# Patient Record
Sex: Female | Born: 1945 | Race: White | Hispanic: No | Smoking: Never smoker
Health system: Southern US, Community
[De-identification: ages and names within clinical notes are randomized; demographics above are authoritative.]

## PROBLEM LIST (undated history)

## (undated) DIAGNOSIS — E119 Type 2 diabetes mellitus without complications: Secondary | ICD-10-CM

## (undated) DIAGNOSIS — F419 Anxiety disorder, unspecified: Secondary | ICD-10-CM

## (undated) DIAGNOSIS — R569 Unspecified convulsions: Secondary | ICD-10-CM

## (undated) DIAGNOSIS — M329 Systemic lupus erythematosus, unspecified: Secondary | ICD-10-CM

## (undated) DIAGNOSIS — F32A Depression, unspecified: Secondary | ICD-10-CM

## (undated) DIAGNOSIS — M069 Rheumatoid arthritis, unspecified: Secondary | ICD-10-CM

## (undated) DIAGNOSIS — E875 Hyperkalemia: Secondary | ICD-10-CM

## (undated) DIAGNOSIS — F329 Major depressive disorder, single episode, unspecified: Secondary | ICD-10-CM

## (undated) DIAGNOSIS — I509 Heart failure, unspecified: Secondary | ICD-10-CM

## (undated) DIAGNOSIS — IMO0001 Reserved for inherently not codable concepts without codable children: Secondary | ICD-10-CM

## (undated) DIAGNOSIS — N189 Chronic kidney disease, unspecified: Secondary | ICD-10-CM

## (undated) DIAGNOSIS — IMO0002 Reserved for concepts with insufficient information to code with codable children: Secondary | ICD-10-CM

## (undated) DIAGNOSIS — I10 Essential (primary) hypertension: Secondary | ICD-10-CM

## (undated) DIAGNOSIS — G629 Polyneuropathy, unspecified: Secondary | ICD-10-CM

## (undated) DIAGNOSIS — R001 Bradycardia, unspecified: Secondary | ICD-10-CM

## (undated) HISTORY — PX: ABDOMINAL HYSTERECTOMY: SHX81

## (undated) HISTORY — PX: CYST EXCISION: SHX5701

## (undated) HISTORY — PX: APPENDECTOMY: SHX54

---

## 2015-01-18 HISTORY — PX: DENTAL SURGERY: SHX609

## 2015-01-22 ENCOUNTER — Inpatient Hospital Stay (HOSPITAL_COMMUNITY)
Admission: AD | Admit: 2015-01-22 | Discharge: 2015-01-28 | DRG: 100 | Disposition: A | Discharge status: Home Health | Payer: Medicare Other | Source: Other Acute Inpatient Hospital | Attending: Internal Medicine | Admitting: Internal Medicine

## 2015-01-22 DIAGNOSIS — I10 Essential (primary) hypertension: Secondary | ICD-10-CM | POA: Diagnosis not present

## 2015-01-22 DIAGNOSIS — E1165 Type 2 diabetes mellitus with hyperglycemia: Secondary | ICD-10-CM | POA: Diagnosis present

## 2015-01-22 DIAGNOSIS — M329 Systemic lupus erythematosus, unspecified: Secondary | ICD-10-CM | POA: Diagnosis present

## 2015-01-22 DIAGNOSIS — T465X6A Underdosing of other antihypertensive drugs, initial encounter: Secondary | ICD-10-CM | POA: Diagnosis present

## 2015-01-22 DIAGNOSIS — N183 Chronic kidney disease, stage 3 (moderate): Secondary | ICD-10-CM | POA: Diagnosis present

## 2015-01-22 DIAGNOSIS — M069 Rheumatoid arthritis, unspecified: Secondary | ICD-10-CM | POA: Diagnosis present

## 2015-01-22 DIAGNOSIS — G9340 Encephalopathy, unspecified: Secondary | ICD-10-CM | POA: Diagnosis present

## 2015-01-22 DIAGNOSIS — E119 Type 2 diabetes mellitus without complications: Secondary | ICD-10-CM | POA: Diagnosis present

## 2015-01-22 DIAGNOSIS — I129 Hypertensive chronic kidney disease with stage 1 through stage 4 chronic kidney disease, or unspecified chronic kidney disease: Secondary | ICD-10-CM | POA: Diagnosis present

## 2015-01-22 DIAGNOSIS — Z91040 Latex allergy status: Secondary | ICD-10-CM | POA: Diagnosis not present

## 2015-01-22 DIAGNOSIS — Z8249 Family history of ischemic heart disease and other diseases of the circulatory system: Secondary | ICD-10-CM

## 2015-01-22 DIAGNOSIS — R4182 Altered mental status, unspecified: Secondary | ICD-10-CM | POA: Diagnosis not present

## 2015-01-22 DIAGNOSIS — Z8673 Personal history of transient ischemic attack (TIA), and cerebral infarction without residual deficits: Secondary | ICD-10-CM | POA: Diagnosis not present

## 2015-01-22 DIAGNOSIS — R410 Disorientation, unspecified: Secondary | ICD-10-CM | POA: Diagnosis not present

## 2015-01-22 DIAGNOSIS — R4701 Aphasia: Secondary | ICD-10-CM | POA: Diagnosis present

## 2015-01-22 DIAGNOSIS — IMO0002 Reserved for concepts with insufficient information to code with codable children: Secondary | ICD-10-CM | POA: Diagnosis present

## 2015-01-22 DIAGNOSIS — R000 Tachycardia, unspecified: Secondary | ICD-10-CM | POA: Diagnosis present

## 2015-01-22 DIAGNOSIS — Z91128 Patient's intentional underdosing of medication regimen for other reason: Secondary | ICD-10-CM | POA: Diagnosis present

## 2015-01-22 DIAGNOSIS — E785 Hyperlipidemia, unspecified: Secondary | ICD-10-CM | POA: Diagnosis present

## 2015-01-22 DIAGNOSIS — N189 Chronic kidney disease, unspecified: Secondary | ICD-10-CM

## 2015-01-22 DIAGNOSIS — E1122 Type 2 diabetes mellitus with diabetic chronic kidney disease: Secondary | ICD-10-CM | POA: Diagnosis present

## 2015-01-22 DIAGNOSIS — Z801 Family history of malignant neoplasm of trachea, bronchus and lung: Secondary | ICD-10-CM

## 2015-01-22 DIAGNOSIS — E875 Hyperkalemia: Secondary | ICD-10-CM | POA: Diagnosis not present

## 2015-01-22 DIAGNOSIS — R441 Visual hallucinations: Secondary | ICD-10-CM | POA: Diagnosis present

## 2015-01-22 DIAGNOSIS — R400 Somnolence: Secondary | ICD-10-CM | POA: Diagnosis not present

## 2015-01-22 DIAGNOSIS — I248 Other forms of acute ischemic heart disease: Secondary | ICD-10-CM | POA: Diagnosis present

## 2015-01-22 DIAGNOSIS — Z820 Family history of epilepsy and other diseases of the nervous system: Secondary | ICD-10-CM | POA: Diagnosis not present

## 2015-01-22 DIAGNOSIS — R9082 White matter disease, unspecified: Secondary | ICD-10-CM | POA: Diagnosis present

## 2015-01-22 DIAGNOSIS — R079 Chest pain, unspecified: Secondary | ICD-10-CM

## 2015-01-22 DIAGNOSIS — E876 Hypokalemia: Secondary | ICD-10-CM | POA: Diagnosis present

## 2015-01-22 DIAGNOSIS — I209 Angina pectoris, unspecified: Secondary | ICD-10-CM | POA: Diagnosis present

## 2015-01-22 DIAGNOSIS — R569 Unspecified convulsions: Principal | ICD-10-CM | POA: Diagnosis present

## 2015-01-22 DIAGNOSIS — F419 Anxiety disorder, unspecified: Secondary | ICD-10-CM | POA: Diagnosis present

## 2015-01-22 DIAGNOSIS — N179 Acute kidney failure, unspecified: Secondary | ICD-10-CM | POA: Diagnosis present

## 2015-01-22 HISTORY — DX: Major depressive disorder, single episode, unspecified: F32.9

## 2015-01-22 HISTORY — DX: Rheumatoid arthritis, unspecified: M06.9

## 2015-01-22 HISTORY — DX: Hyperkalemia: E87.5

## 2015-01-22 HISTORY — DX: Anxiety disorder, unspecified: F41.9

## 2015-01-22 HISTORY — DX: Type 2 diabetes mellitus without complications: E11.9

## 2015-01-22 HISTORY — DX: Polyneuropathy, unspecified: G62.9

## 2015-01-22 HISTORY — DX: Essential (primary) hypertension: I10

## 2015-01-22 HISTORY — DX: Reserved for inherently not codable concepts without codable children: IMO0001

## 2015-01-22 HISTORY — DX: Bradycardia, unspecified: R00.1

## 2015-01-22 HISTORY — DX: Chronic kidney disease, unspecified: N18.9

## 2015-01-22 HISTORY — DX: Unspecified convulsions: R56.9

## 2015-01-22 HISTORY — DX: Depression, unspecified: F32.A

## 2015-01-22 HISTORY — DX: Systemic lupus erythematosus, unspecified: M32.9

## 2015-01-22 HISTORY — DX: Reserved for concepts with insufficient information to code with codable children: IMO0002

## 2015-01-22 HISTORY — DX: Heart failure, unspecified: I50.9

## 2015-01-22 LAB — CBC
HCT: 31.7 % — ABNORMAL LOW (ref 36.0–46.0)
Hemoglobin: 10.1 g/dL — ABNORMAL LOW (ref 12.0–15.0)
MCH: 25.9 pg — ABNORMAL LOW (ref 26.0–34.0)
MCHC: 31.9 g/dL (ref 30.0–36.0)
MCV: 81.3 fL (ref 78.0–100.0)
Platelets: 157 K/uL (ref 150–400)
RBC: 3.9 MIL/uL (ref 3.87–5.11)
RDW: 15 % (ref 11.5–15.5)
WBC: 13.7 K/uL — ABNORMAL HIGH (ref 4.0–10.5)

## 2015-01-22 LAB — GLUCOSE, CAPILLARY: Glucose-Capillary: 332 mg/dL — ABNORMAL HIGH (ref 65–99)

## 2015-01-22 LAB — HEPATIC FUNCTION PANEL
ALBUMIN: 3.5 g/dL (ref 3.5–5.0)
ALT: 17 U/L (ref 14–54)
AST: 43 U/L — AB (ref 15–41)
Alkaline Phosphatase: 121 U/L (ref 38–126)
Bilirubin, Direct: 0.3 mg/dL (ref 0.1–0.5)
Indirect Bilirubin: 1.1 mg/dL — ABNORMAL HIGH (ref 0.3–0.9)
TOTAL PROTEIN: 7 g/dL (ref 6.5–8.1)
Total Bilirubin: 1.4 mg/dL — ABNORMAL HIGH (ref 0.3–1.2)

## 2015-01-22 LAB — CREATININE, SERUM
Creatinine, Ser: 1.25 mg/dL — ABNORMAL HIGH (ref 0.44–1.00)
GFR calc Af Amer: 50 mL/min — ABNORMAL LOW (ref >60)
GFR calc non Af Amer: 43 mL/min — ABNORMAL LOW (ref >60)

## 2015-01-22 LAB — MRSA PCR SCREENING: MRSA BY PCR: NEGATIVE

## 2015-01-22 LAB — TSH: TSH: 2.247 u[IU]/mL (ref 0.350–4.500)

## 2015-01-22 LAB — PROTIME-INR
INR: 1.24 (ref 0.00–1.49)
Prothrombin Time: 15.7 seconds — ABNORMAL HIGH (ref 11.6–15.2)

## 2015-01-22 LAB — TROPONIN I: TROPONIN I: 0.35 ng/mL — AB (ref <0.031)

## 2015-01-22 MED ORDER — HYDRALAZINE HCL 20 MG/ML IJ SOLN
10.0000 mg | Freq: Once | INTRAMUSCULAR | Status: AC
  Administered 2015-01-22: 10 mg via INTRAVENOUS
  Filled 2015-01-22: qty 1

## 2015-01-22 MED ORDER — ONDANSETRON HCL 4 MG PO TABS: 4.0000 mg | ORAL_TABLET | Freq: Four times a day (QID) | ORAL | Status: DC | PRN

## 2015-01-22 MED ORDER — ACETAMINOPHEN 650 MG RE SUPP
650.0000 mg | Freq: Four times a day (QID) | RECTAL | Status: DC | PRN
  Administered 2015-01-23: 650 mg via RECTAL
  Filled 2015-01-22: qty 1

## 2015-01-22 MED ORDER — NITROGLYCERIN 2 % TD OINT
0.5000 [in_us] | TOPICAL_OINTMENT | Freq: Four times a day (QID) | TRANSDERMAL | Status: DC
  Administered 2015-01-22 – 2015-01-26 (×15): 0.5 [in_us] via TOPICAL
  Filled 2015-01-22 (×3): qty 30

## 2015-01-22 MED ORDER — LORAZEPAM 2 MG/ML IJ SOLN
1.0000 mg | INTRAMUSCULAR | Status: DC | PRN
  Administered 2015-01-23: 2 mg via INTRAVENOUS
  Administered 2015-01-23: 1.5 mg via INTRAVENOUS
  Filled 2015-01-22 (×4): qty 1

## 2015-01-22 MED ORDER — SODIUM CHLORIDE 0.9 % IJ SOLN
3.0000 mL | Freq: Two times a day (BID) | INTRAMUSCULAR | Status: DC
  Administered 2015-01-23 – 2015-01-28 (×7): 3 mL via INTRAVENOUS

## 2015-01-22 MED ORDER — VITAMIN B-1 100 MG PO TABS
100.0000 mg | ORAL_TABLET | Freq: Every day | ORAL | Status: DC
  Administered 2015-01-22 – 2015-01-28 (×5): 100 mg via ORAL
  Filled 2015-01-22 (×7): qty 1

## 2015-01-22 MED ORDER — INSULIN ASPART 100 UNIT/ML ~~LOC~~ SOLN
0.0000 [IU] | SUBCUTANEOUS | Status: DC
  Administered 2015-01-22: 11 [IU] via SUBCUTANEOUS
  Administered 2015-01-23: 3 [IU] via SUBCUTANEOUS
  Administered 2015-01-23: 11 [IU] via SUBCUTANEOUS
  Administered 2015-01-23: 2 [IU] via SUBCUTANEOUS
  Administered 2015-01-23 – 2015-01-24 (×2): 3 [IU] via SUBCUTANEOUS
  Administered 2015-01-24: 5 [IU] via SUBCUTANEOUS
  Administered 2015-01-24: 3 [IU] via SUBCUTANEOUS
  Administered 2015-01-24: 2 [IU] via SUBCUTANEOUS

## 2015-01-22 MED ORDER — DOCUSATE SODIUM 100 MG PO CAPS
100.0000 mg | ORAL_CAPSULE | Freq: Two times a day (BID) | ORAL | Status: DC
  Administered 2015-01-25 – 2015-01-28 (×7): 100 mg via ORAL
  Filled 2015-01-22 (×7): qty 1

## 2015-01-22 MED ORDER — POLYETHYLENE GLYCOL 3350 17 G PO PACK
17.0000 g | PACK | Freq: Every day | ORAL | Status: DC | PRN
  Filled 2015-01-22: qty 1

## 2015-01-22 MED ORDER — METFORMIN HCL 500 MG PO TABS
1000.0000 mg | ORAL_TABLET | Freq: Two times a day (BID) | ORAL | Status: DC
  Filled 2015-01-22 (×3): qty 2

## 2015-01-22 MED ORDER — INSULIN DETEMIR 100 UNIT/ML ~~LOC~~ SOLN
80.0000 [IU] | Freq: Every day | SUBCUTANEOUS | Status: DC
  Administered 2015-01-22: 80 [IU] via SUBCUTANEOUS
  Filled 2015-01-22 (×2): qty 0.8

## 2015-01-22 MED ORDER — ADULT MULTIVITAMIN W/MINERALS CH
1.0000 | ORAL_TABLET | Freq: Every day | ORAL | Status: DC
  Administered 2015-01-22 – 2015-01-28 (×5): 1 via ORAL
  Filled 2015-01-22 (×7): qty 1

## 2015-01-22 MED ORDER — ACETAMINOPHEN 325 MG PO TABS
650.0000 mg | ORAL_TABLET | Freq: Four times a day (QID) | ORAL | Status: DC | PRN
  Administered 2015-01-24: 650 mg via ORAL
  Filled 2015-01-22: qty 2

## 2015-01-22 MED ORDER — PHENYTOIN SODIUM 50 MG/ML IJ SOLN
100.0000 mg | Freq: Three times a day (TID) | INTRAMUSCULAR | Status: DC
  Administered 2015-01-23 – 2015-01-24 (×3): 100 mg via INTRAVENOUS
  Filled 2015-01-22 (×6): qty 2

## 2015-01-22 MED ORDER — SODIUM CHLORIDE 0.9 % IV SOLN
75.0000 mL/h | INTRAVENOUS | Status: DC
  Administered 2015-01-22 – 2015-01-25 (×2): 75 mL/h via INTRAVENOUS

## 2015-01-22 MED ORDER — FOLIC ACID 1 MG PO TABS
1.0000 mg | ORAL_TABLET | Freq: Every day | ORAL | Status: DC
  Administered 2015-01-22 – 2015-01-28 (×5): 1 mg via ORAL
  Filled 2015-01-22 (×7): qty 1

## 2015-01-22 MED ORDER — CLONIDINE HCL 0.3 MG PO TABS
0.3000 mg | ORAL_TABLET | Freq: Two times a day (BID) | ORAL | Status: DC
  Administered 2015-01-22: 0.3 mg via ORAL
  Filled 2015-01-22 (×3): qty 1

## 2015-01-22 MED ORDER — SERTRALINE HCL 100 MG PO TABS
100.0000 mg | ORAL_TABLET | Freq: Every day | ORAL | Status: AC
  Administered 2015-01-22 – 2015-01-27 (×4): 100 mg via ORAL
  Filled 2015-01-22 (×7): qty 1

## 2015-01-22 MED ORDER — LORAZEPAM 2 MG/ML IJ SOLN
4.0000 mg | INTRAMUSCULAR | Status: AC
  Administered 2015-01-22: 4 mg via INTRAVENOUS
  Filled 2015-01-22: qty 2

## 2015-01-22 MED ORDER — LORAZEPAM 2 MG/ML IJ SOLN
4.0000 mg | INTRAMUSCULAR | Status: AC
  Administered 2015-01-22: 4 mg via INTRAVENOUS

## 2015-01-22 MED ORDER — ONDANSETRON HCL 4 MG/2ML IJ SOLN: 4.0000 mg | Freq: Four times a day (QID) | INTRAMUSCULAR | Status: DC | PRN

## 2015-01-22 MED ORDER — SODIUM CHLORIDE 0.9 % IV SOLN: INTRAVENOUS | Status: DC

## 2015-01-22 MED ORDER — PRAVASTATIN SODIUM 40 MG PO TABS
40.0000 mg | ORAL_TABLET | Freq: Every day | ORAL | Status: DC
  Administered 2015-01-24: 40 mg via ORAL
  Filled 2015-01-22 (×3): qty 1

## 2015-01-22 MED ORDER — SODIUM CHLORIDE 0.9 % IV SOLN
1000.0000 mg | Freq: Once | INTRAVENOUS | Status: AC
  Administered 2015-01-22: 1000 mg via INTRAVENOUS
  Filled 2015-01-22: qty 20

## 2015-01-22 MED ORDER — PANTOPRAZOLE SODIUM 40 MG PO TBEC
40.0000 mg | DELAYED_RELEASE_TABLET | Freq: Every day | ORAL | Status: DC
  Administered 2015-01-25 – 2015-01-28 (×4): 40 mg via ORAL
  Filled 2015-01-22 (×5): qty 1

## 2015-01-22 MED ORDER — HYDRALAZINE HCL 50 MG PO TABS
50.0000 mg | ORAL_TABLET | Freq: Three times a day (TID) | ORAL | Status: DC
  Administered 2015-01-22: 50 mg via ORAL
  Filled 2015-01-22 (×5): qty 1

## 2015-01-22 MED ORDER — HEPARIN SODIUM (PORCINE) 5000 UNIT/ML IJ SOLN
5000.0000 [IU] | Freq: Three times a day (TID) | INTRAMUSCULAR | Status: DC
  Administered 2015-01-22 – 2015-01-28 (×17): 5000 [IU] via SUBCUTANEOUS
  Filled 2015-01-22 (×21): qty 1

## 2015-01-22 NOTE — Progress Notes (Signed)
MEDICATION RELATED CONSULT NOTE - INITIAL   Pharmacy Consult for phenytoin IV Indication: new-onset seizures  Allergies  Allergen Reactions  . Latex     Patient Measurements: Height: 5\' 2"  (157.5 cm) Weight: 154 lb 8.7 oz (70.1 kg) IBW/kg (Calculated) : 50.1 Adjusted BW: 58.1kg  Vital Signs: Temp: 99.5 F (37.5 C) (09/08 2018) Temp Source: Axillary (09/08 2018) BP: 214/92 mmHg (09/08 1849) Pulse Rate: 127 (09/08 1850) Intake/Output from previous day:   Intake/Output from this shift:    Labs: No results for input(s): WBC, HGB, HCT, PLT, APTT, CREATININE, LABCREA, CREATININE, CREAT24HRUR, MG, PHOS, ALBUMIN, PROT, ALBUMIN, AST, ALT, ALKPHOS, BILITOT, BILIDIR, IBILI in the last 72 hours. CrCl cannot be calculated (Patient has no serum creatinine result on file.).   Microbiology: Recent Results (from the past 720 hour(s))  MRSA PCR Screening     Status: None   Collection Time: 01/22/15  6:45 PM  Result Value Ref Range Status   MRSA by PCR NEGATIVE NEGATIVE Final    Comment:        The GeneXpert MRSA Assay (FDA approved for NASAL specimens only), is one component of a comprehensive MRSA colonization surveillance program. It is not intended to diagnose MRSA infection nor to guide or monitor treatment for MRSA infections.     Medical History: No past medical history on file.  Assessment: 69 y.o. female with prior history of DM Type II, CKD, HTN, Lupus, and RA presents as a transfer from [facility] regional for new onset seizures and altered mental status. Pharmacy consulted to dose phenytoin IV and to assess for drug interactions. Adjusted BW used due to TBW> 120% of IBW. Will order baseline labs not already entered. No labs available yet.   Goal of Therapy:  Seizure prevention  Plan:  Phenytoin 1g IV load Phenytoin 100mg  IV q8h - to start 12h after end of load Baseline SCr, LFTs, albumin F/u phenytoin level after 24 hours to ensure appropriate load Obtain a  level 5-7 days after therapy initiation or adjustments to assess trend in concentration.  A true steady state is not reached for 10-14 days   Elicia Lamp, PharmD Clinical Pharmacist Pager [phone] 01/22/2015 9:06 PM

## 2015-01-22 NOTE — Consult Note (Addendum)
Reason for Consult:Altered mental status, seizure Referring Physician: Humphrey Rolls  CC: Altered mental status, seizure  HPI: Stacy Smith is an 69 y.o. female with altered mental status.  Patient unable to provide history.  All history obtained from the chart.  Patient presented to [facility] from home. Patient's husband states that around 62 PM today she was found on the floor. Patients family called EMS. He states that he asked her to squeeze his finger and she was unable to do that so he was concerned for stroke. Patient's daughter states that she saw the patient having a seizure which lasted about 30 seconds after which she started to snore and grunt and went limp. She did not vomit but did she lose control of her bowel and bladder in the ambulance.  Patient has had recent visual hallucinations.  Has been noncompliant with her antihypertensive medications.    Past medical history: DM, CKD, HTN, Lupus, RA, CHF  Past surgical history: Unable to obtain secondary to mental status  Family history: Mother with CHF and Alzheimer's.  Father with metastatic lung cancer.    Social History:  has no tobacco, alcohol, and drug history on file.  Allergies  Allergen Reactions  . Latex     Medications:  I have reviewed the patient's current medications. Prior to Admission:  Prescriptions prior to admission  Medication Sig Dispense Refill Last Dose  . cloNIDine (CATAPRES) 0.3 MG tablet Take 0.3 mg by mouth 2 (two) times daily.     Marland Kitchen LEVEMIR FLEXTOUCH 100 UNIT/ML Pen      . LORazepam (ATIVAN) 1 MG tablet Take 1 mg by mouth 3 (three) times daily.     . metFORMIN (GLUCOPHAGE) 1000 MG tablet Take 1,000 mg by mouth 2 (two) times daily.     Marland Kitchen omeprazole (PRILOSEC) 40 MG capsule Take 40 mg by mouth daily.     . ondansetron (ZOFRAN-ODT) 8 MG disintegrating tablet Take 8 mg by mouth 2 (two) times daily.     . pravastatin (PRAVACHOL) 80 MG tablet Take 80 mg by mouth daily.     . sertraline (ZOLOFT) 100 MG  tablet Take 100 mg by mouth 2 (two) times daily.     Marland Kitchen tiZANidine (ZANAFLEX) 4 MG tablet Take 4 mg by mouth.      Scheduled: . cloNIDine  0.3 mg Oral BID  . docusate sodium  100 mg Oral BID  . folic acid  1 mg Oral Daily  . heparin  5,000 Units Subcutaneous 3 times per day  . hydrALAZINE  50 mg Oral 3 times per day  . insulin aspart  0-15 Units Subcutaneous 6 times per day  . insulin detemir  80 Units Subcutaneous QHS  . [START ON 01/23/2015] metFORMIN  1,000 mg Oral BID WC  . multivitamin with minerals  1 tablet Oral Daily  . nitroGLYCERIN  0.5 inch Topical 4 times per day  . pantoprazole  40 mg Oral Daily  . phenytoin (DILANTIN) IV  1,000 mg Intravenous Once  . [START ON 01/23/2015] phenytoin (DILANTIN) IV  100 mg Intravenous 3 times per day  . [START ON 01/23/2015] pravastatin  40 mg Oral q1800  . sertraline  100 mg Oral Daily  . sodium chloride  3 mL Intravenous Q12H  . thiamine  100 mg Oral Daily    ROS: History obtained from chart review  General ROS: negative for - chills, fatigue, fever, night sweats, weight gain or weight loss Psychological ROS: as noted in HPI Ophthalmic ROS: negative  for - blurry vision, double vision, eye pain or loss of vision ENT ROS: negative for - epistaxis, nasal discharge, oral lesions, sore throat, tinnitus or vertigo Allergy and Immunology ROS: negative for - hives or itchy/watery eyes Hematological and Lymphatic ROS: negative for - bleeding problems, bruising or swollen lymph nodes Endocrine ROS: negative for - galactorrhea, hair pattern changes, polydipsia/polyuria or temperature intolerance Respiratory ROS: negative for - cough, hemoptysis, shortness of breath or wheezing Cardiovascular ROS: negative for - chest pain, dyspnea on exertion, edema or irregular heartbeat Gastrointestinal ROS: as noted in HPI Genito-Urinary ROS: as noted in HPI Musculoskeletal ROS: negative for - joint swelling or muscular weakness Neurological ROS: as noted in  HPI Dermatological ROS: negative for rash and skin lesion changes  Physical Examination: Blood pressure 214/92, pulse 127, temperature 99.5 F (37.5 C), temperature source Axillary, resp. rate 26, height _0  (1.575 m), weight 70.1 kg (154 lb 8.7 oz), SpO2 99 %.  HEENT-  Normocephalic, no lesions, without obvious abnormality.  Normal external eye and conjunctiva.  Normal TM's bilaterally.  Normal auditory canals and external ears. Normal external nose, mucus membranes and septum.  Normal pharynx. Cardiovascular- S1, S2 normal, pulses palpable throughout   Lungs- chest clear, no wheezing, rales, normal symmetric air entry Abdomen- soft, non-tender; bowel sounds normal; no masses,  no organomegaly Extremities- no edema Lymph-no adenopathy palpable Musculoskeletal-no joint tenderness, deformity or swelling Skin-warm and dry, no hyperpigmentation, vitiligo, or suspicious lesions  Neurological Examination Mental Status: Alert and agitated. Patient oriented to name but reports that it is 20 and that it is October.  Thinks she is still at [place] commands.  Speech aphasic, incomprehensible at times, some paraphasic errors noted.  Tries to eat mits. Cranial Nerves: II: Discs flat bilaterally; Blinks to bilateral confrontation, pupils equal, round, reactive to light and accommodation III,IV, VI: ptosis not present, extra-ocular motions intact bilaterally V,VII: smile symmetric, facial light touch sensation normal bilaterally VIII: hearing normal bilaterally IX,X: gag reflex present XI: bilateral shoulder shrug XII: midline tongue extension Motor: Moves all extremities strongly against gravity.  No focal weakness noted.   Sensory: Pinprick and light touch intact throughout, bilaterally Deep Tendon Reflexes: 2+ and symmetric throughout Plantars: Right: mute   Left: mute Cerebellar: Unable to perform secondary to agitation Gait: not tested due to safety  concerns    Laboratory Studies:   Basic Metabolic Panel: No results for input(s): NA, K, CL, CO2, GLUCOSE, BUN, CREATININE, CALCIUM, MG, PHOS in the last 168 hours.  Liver Function Tests: No results for input(s): AST, ALT, ALKPHOS, BILITOT, PROT, ALBUMIN in the last 168 hours. No results for input(s): LIPASE, AMYLASE in the last 168 hours. No results for input(s): AMMONIA in the last 168 hours.  CBC: No results for input(s): WBC, NEUTROABS, HGB, HCT, MCV, PLT in the last 168 hours.  Cardiac Enzymes: No results for input(s): CKTOTAL, CKMB, CKMBINDEX, TROPONINI in the last 168 hours.  BNP: Invalid input(s): POCBNP  CBG: No results for input(s): GLUCAP in the last 168 hours.  Microbiology: Results for orders placed or performed during the hospital encounter of 01/22/15  MRSA PCR Screening     Status: None   Collection Time: 01/22/15  6:45 PM  Result Value Ref Range Status   MRSA by PCR NEGATIVE NEGATIVE Final    Comment:        The GeneXpert MRSA Assay (FDA approved for NASAL specimens only), is one component of a comprehensive MRSA colonization surveillance program. It is  not intended to diagnose MRSA infection nor to guide or monitor treatment for MRSA infections.     Coagulation Studies: No results for input(s): LABPROT, INR in the last 72 hours.  Urinalysis: No results for input(s): COLORURINE, LABSPEC, PHURINE, GLUCOSEU, HGBUR, BILIRUBINUR, KETONESUR, PROTEINUR, UROBILINOGEN, NITRITE, LEUKOCYTESUR in the last 168 hours.  Invalid input(s): APPERANCEUR  Lipid Panel:  No results found for: CHOL, TRIG, HDL, CHOLHDL, VLDL, LDLCALC  HgbA1C: No results found for: HGBA1C  Urine Drug Screen:  No results found for: LABOPIA, COCAINSCRNUR, LABBENZ, AMPHETMU, THCU, LABBARB  Alcohol Level: No results for input(s): ETH in the last 168 hours.   Imaging: No results found.   Assessment/Plan: 69 year old female with altered mental status.  Was noted by family to have a  seizure today.  Does not have a history of seizures.  On neurological examination not only is the patient confused, but appears to be aphasic as well.  Is hypertensive, likely due to noncompliance.  Can not rule out PRES as etiology of all presenting symptoms but other things of concern in the differential include metabolic encephalopathy, intracranial lesion, CNS lupus, prolonged post-ictal state, etc,  Lab work from [place] reviewed and multiple metabolic issues are present.  Head CT performed at [facility] shows no evidence of hemorrhage.  Further work up recommended.    Recommendations: 1.  Patient will likely require a MRI of the brain with contrast as well but will likely require conscious sedation due to level of agitation 2.  EEG 3.  Seizure precautions 4.  Ativan prn seizure 5.  ESR, serum magnesium, serum phosphorus 6.  Patient started on Dilantin.  Need to continue beyond this hospitalization to be determined based on above work up.    7.  BP control 8.  Agree with addressing metabolic issues   Alexis Goodell, MD [facility] [phone] 01/22/2015, 9:04 PM

## 2015-01-22 NOTE — H&P (Signed)
[facility] History and Physical  Stacy Smith [id number] DOB: Oct 28, 1945 DOA: 01/22/2015  Referring physician: Deeann Dowse, MD PCP: No primary care provider on file.   Chief Complaint: New onset seizures and altered mental status  HPI: Stacy Smith is a 69 y.o. female with prior history of DM Type II CKD HTN Lupus RA presents as a transfer from [facility] regional for new onset seizures and altered mental status. Patient presented to [facility] from home. Patients husband states that around 12 PM today she was found on the floor. Patients family called EMS. He states that he asked her to squeeze his finger and she was able to do that as he was concerned for stroke. Patients daughter states that she saw the patient having a seizure which lasted about 30 seconds after which she started to snore and grunt and went limp. She did not vomit and she lost control of her bowel and bladder in the ambulance. Her husband states that she has been sick 3 times with a slow heart beat and also CHF. Patient was admitted at that hospital at that time. She has had a problem with hyperkalemia in the past. She also has CKD III and is a diabetic. She has had a CT scan done in the past which acording to the daughter showed a blood clot which had "sealed off" The daughter states that she also believes that she sees her dead parents and she is also seeing other things in the rooms that are not there.   Review of Systems:  Patient is not able to provide a HPI.   No past medical history on file. No past surgical history on file. Social History:  has no tobacco, alcohol, and drug history on file.  Allergies not on file  No family history on file.   Prior to Admission medications   Not on File   Physical Exam: Filed Vitals:   01/22/15 1849 01/22/15 1850  BP: 214/92   Pulse: 122 127  Temp: 100.6 F (38.1 C) 99.5 F (37.5 C)  TempSrc: Oral Rectal  Resp: 17 26  SpO2: 99%     Wt  Readings from Last 3 Encounters:  No data found for Wt    General:  Combative at times not oriented Eyes: normal lids, irises & conjunctiva ENT: grossly normal hearing Neck: no LAD, masses or thyromegaly Cardiovascular: RRR, no m/r/g. Respiratory: CTA bilaterally, no w/r/r Abdomen: soft, ntnd Skin: no rash or induration seen on limited exam Musculoskeletal: grossly normal tone BUE/BLE Psychiatric: confused agitated Neurologic: unable to assess but she is moving all 4 extremities          Labs on Admission:  Basic Metabolic Panel: No results for input(s): NA, K, CL, CO2, GLUCOSE, BUN, CREATININE, CALCIUM, MG, PHOS in the last 168 hours. Liver Function Tests: No results for input(s): AST, ALT, ALKPHOS, BILITOT, PROT, ALBUMIN in the last 168 hours. No results for input(s): LIPASE, AMYLASE in the last 168 hours. No results for input(s): AMMONIA in the last 168 hours. CBC: No results for input(s): WBC, NEUTROABS, HGB, HCT, MCV, PLT in the last 168 hours. Cardiac Enzymes: No results for input(s): CKTOTAL, CKMB, CKMBINDEX, TROPONINI in the last 168 hours.  BNP (last 3 results) No results for input(s): BNP in the last 8760 hours.  ProBNP (last 3 results) No results for input(s): PROBNP in the last 8760 hours.  CBG: No results for input(s): GLUCAP in the last 168 hours.  Radiological Exams on Admission: No results  found.    Assessment/Plan Principal Problem:   Convulsions/seizures Active Problems:   Diabetes   Essential hypertension   Hyperlipidemia   1. New onset seizures/Altered Mental Status -possibly related to not taking her blood pressure meds and elevated BP noted  -currently she is seizure free but is quite confused. She will need a neurology evaluation and MRI MRA -will defer to neurology regarding any antiseizure medications -EEG ordered -frequent neuro checks  -will give ativan as needed for seizure  2. Diabetes Mellitus type 2 -she is on Metformin and  also is on levemir -will check an A1C -will monitor FSBS and place on SSI as needed  3. Essential HTN -continue with clonidine per family she is not taking lisinopril as she had some side effects from these -will restart hydralazine and consider also starting back on inderal to bring pressures down slowly -monitor pressures  4. Hyperlipidemia -will continue with pravachol not on lopid per family -check lipid panel  5. Rheumatoid Arthritis/Lupus -will monitor -recheck sed rate  6. CKD III -repeat labs  7. Hypokalemia -noted at other facility will repeat labs    Code Status: full code (must indicate code status--if unknown or must be presumed, indicate so) DVT Prophylaxis:heparin Family Communication: husband (indicate person spoken with, if applicable, with phone number if by telephone) Disposition Plan: home (indicate anticipated LOS)     [facility] Pager [phone]

## 2015-01-23 ENCOUNTER — Other Ambulatory Visit (HOSPITAL_COMMUNITY): Payer: Self-pay

## 2015-01-23 ENCOUNTER — Inpatient Hospital Stay (HOSPITAL_COMMUNITY): Payer: Medicare Other

## 2015-01-23 DIAGNOSIS — N179 Acute kidney failure, unspecified: Secondary | ICD-10-CM | POA: Diagnosis present

## 2015-01-23 DIAGNOSIS — E876 Hypokalemia: Secondary | ICD-10-CM | POA: Diagnosis present

## 2015-01-23 DIAGNOSIS — E785 Hyperlipidemia, unspecified: Secondary | ICD-10-CM | POA: Diagnosis present

## 2015-01-23 DIAGNOSIS — R569 Unspecified convulsions: Secondary | ICD-10-CM | POA: Diagnosis present

## 2015-01-23 DIAGNOSIS — M329 Systemic lupus erythematosus, unspecified: Secondary | ICD-10-CM | POA: Diagnosis present

## 2015-01-23 DIAGNOSIS — M069 Rheumatoid arthritis, unspecified: Secondary | ICD-10-CM | POA: Diagnosis present

## 2015-01-23 DIAGNOSIS — R4182 Altered mental status, unspecified: Secondary | ICD-10-CM | POA: Diagnosis present

## 2015-01-23 DIAGNOSIS — R400 Somnolence: Secondary | ICD-10-CM

## 2015-01-23 DIAGNOSIS — N189 Chronic kidney disease, unspecified: Secondary | ICD-10-CM

## 2015-01-23 LAB — CBC
HEMATOCRIT: 32.2 % — AB (ref 36.0–46.0)
HEMOGLOBIN: 10.4 g/dL — AB (ref 12.0–15.0)
MCH: 26.3 pg (ref 26.0–34.0)
MCHC: 32.3 g/dL (ref 30.0–36.0)
MCV: 81.3 fL (ref 78.0–100.0)
Platelets: 227 10*3/uL (ref 150–400)
RBC: 3.96 MIL/uL (ref 3.87–5.11)
RDW: 15.1 % (ref 11.5–15.5)
WBC: 19.1 10*3/uL — AB (ref 4.0–10.5)

## 2015-01-23 LAB — AMMONIA: AMMONIA: 61 umol/L — AB (ref 9–35)

## 2015-01-23 LAB — POTASSIUM: Potassium: 3.8 mmol/L (ref 3.5–5.1)

## 2015-01-23 LAB — COMPREHENSIVE METABOLIC PANEL
ALBUMIN: 3.4 g/dL — AB (ref 3.5–5.0)
ALK PHOS: 108 U/L (ref 38–126)
ALT: 16 U/L (ref 14–54)
AST: 49 U/L — AB (ref 15–41)
Anion gap: 12 (ref 5–15)
BILIRUBIN TOTAL: 0.9 mg/dL (ref 0.3–1.2)
BUN: 9 mg/dL (ref 6–20)
CO2: 23 mmol/L (ref 22–32)
Calcium: 9.1 mg/dL (ref 8.9–10.3)
Chloride: 105 mmol/L (ref 101–111)
Creatinine, Ser: 1.25 mg/dL — ABNORMAL HIGH (ref 0.44–1.00)
GFR calc Af Amer: 50 mL/min — ABNORMAL LOW (ref >60)
GFR calc non Af Amer: 43 mL/min — ABNORMAL LOW (ref >60)
GLUCOSE: 179 mg/dL — AB (ref 65–99)
POTASSIUM: 3.1 mmol/L — AB (ref 3.5–5.1)
Sodium: 140 mmol/L (ref 135–145)
TOTAL PROTEIN: 7.2 g/dL (ref 6.5–8.1)

## 2015-01-23 LAB — SEDIMENTATION RATE
SED RATE: 62 mm/h — AB (ref 0–22)
Sed Rate: 62 mm/hr — ABNORMAL HIGH (ref 0–22)

## 2015-01-23 LAB — CSF CELL COUNT WITH DIFFERENTIAL
RBC Count, CSF: 10 /mm3 — ABNORMAL HIGH
Tube #: 1
WBC CSF: 3 /mm3 (ref 0–5)

## 2015-01-23 LAB — LIPID PANEL
CHOLESTEROL: 197 mg/dL (ref 0–200)
HDL: 24 mg/dL — ABNORMAL LOW (ref >40)
LDL Cholesterol: 131 mg/dL — ABNORMAL HIGH (ref 0–99)
Total CHOL/HDL Ratio: 8.2 RATIO
Triglycerides: 212 mg/dL — ABNORMAL HIGH (ref <150)
VLDL: 42 mg/dL — ABNORMAL HIGH (ref 0–40)

## 2015-01-23 LAB — GLUCOSE, CAPILLARY
GLUCOSE-CAPILLARY: 143 mg/dL — AB (ref 65–99)
GLUCOSE-CAPILLARY: 169 mg/dL — AB (ref 65–99)
Glucose-Capillary: 115 mg/dL — ABNORMAL HIGH (ref 65–99)
Glucose-Capillary: 158 mg/dL — ABNORMAL HIGH (ref 65–99)
Glucose-Capillary: 304 mg/dL — ABNORMAL HIGH (ref 65–99)

## 2015-01-23 LAB — TROPONIN I
TROPONIN I: 0.29 ng/mL — AB (ref <0.031)
Troponin I: 0.21 ng/mL — ABNORMAL HIGH (ref <0.031)

## 2015-01-23 LAB — MAGNESIUM
MAGNESIUM: 2.8 mg/dL — AB (ref 1.7–2.4)
Magnesium: 1.6 mg/dL — ABNORMAL LOW (ref 1.7–2.4)
Magnesium: 2.8 mg/dL — ABNORMAL HIGH (ref 1.7–2.4)

## 2015-01-23 LAB — GLUCOSE, CSF: GLUCOSE CSF: 78 mg/dL — AB (ref 40–70)

## 2015-01-23 LAB — PROTEIN, CSF: Total  Protein, CSF: 50 mg/dL — ABNORMAL HIGH (ref 15–45)

## 2015-01-23 LAB — PHOSPHORUS
Phosphorus: 1.9 mg/dL — ABNORMAL LOW (ref 2.5–4.6)
Phosphorus: 1.7 mg/dL — ABNORMAL LOW (ref 2.5–4.6)

## 2015-01-23 MED ORDER — MIDAZOLAM HCL 2 MG/2ML IJ SOLN
2.0000 mg | Freq: Once | INTRAMUSCULAR | Status: DC
  Filled 2015-01-23: qty 2

## 2015-01-23 MED ORDER — CLONIDINE HCL 0.3 MG PO TABS
0.3000 mg | ORAL_TABLET | Freq: Three times a day (TID) | ORAL | Status: DC
  Administered 2015-01-23 – 2015-01-27 (×12): 0.3 mg via ORAL
  Filled 2015-01-23 (×13): qty 1

## 2015-01-23 MED ORDER — LORAZEPAM 2 MG/ML IJ SOLN
2.0000 mg | Freq: Once | INTRAMUSCULAR | Status: AC
  Administered 2015-01-23: 1.5 mg via INTRAVENOUS
  Filled 2015-01-23: qty 1

## 2015-01-23 MED ORDER — POTASSIUM CHLORIDE 10 MEQ/100ML IV SOLN
10.0000 meq | INTRAVENOUS | Status: AC
  Administered 2015-01-23 (×4): 10 meq via INTRAVENOUS
  Filled 2015-01-23 (×2): qty 100

## 2015-01-23 MED ORDER — HYDRALAZINE HCL 20 MG/ML IJ SOLN
5.0000 mg | Freq: Four times a day (QID) | INTRAMUSCULAR | Status: DC
  Administered 2015-01-23 (×2): 5 mg via INTRAVENOUS
  Filled 2015-01-23 (×2): qty 1

## 2015-01-23 MED ORDER — HYDRALAZINE HCL 20 MG/ML IJ SOLN
10.0000 mg | Freq: Once | INTRAMUSCULAR | Status: AC
  Administered 2015-01-23: 10 mg via INTRAVENOUS
  Filled 2015-01-23: qty 1

## 2015-01-23 MED ORDER — MAGNESIUM SULFATE 50 % IJ SOLN
3.0000 g | Freq: Once | INTRAMUSCULAR | Status: AC
  Administered 2015-01-23: 3 g via INTRAVENOUS
  Filled 2015-01-23: qty 6

## 2015-01-23 MED ORDER — WHITE PETROLATUM GEL
Status: AC
  Filled 2015-01-23: qty 1

## 2015-01-23 MED ORDER — HYDRALAZINE HCL 20 MG/ML IJ SOLN
10.0000 mg | Freq: Four times a day (QID) | INTRAMUSCULAR | Status: DC
  Administered 2015-01-23 – 2015-01-26 (×11): 10 mg via INTRAVENOUS
  Filled 2015-01-23 (×2): qty 1
  Filled 2015-01-23 (×6): qty 0.5
  Filled 2015-01-23: qty 1
  Filled 2015-01-23 (×3): qty 0.5
  Filled 2015-01-23: qty 1
  Filled 2015-01-23 (×3): qty 0.5

## 2015-01-23 MED FILL — Lorazepam Inj 2 MG/ML: INTRAMUSCULAR | Qty: 1 | Status: AC

## 2015-01-23 MED FILL — Midazolam HCl Inj 5 MG/5ML (Base Equivalent): INTRAMUSCULAR | Qty: 5 | Status: AC

## 2015-01-23 NOTE — Progress Notes (Signed)
Obtained consent from adult daughter for LP. Daughter states LP was performed at [facility] earlier this year; uncertain about date. Daughter states CSF was not obtained. Just as info.

## 2015-01-23 NOTE — Progress Notes (Signed)
UR COMPLETED  

## 2015-01-23 NOTE — Procedures (Signed)
LP Procedure Note:  Patient has been seen and examined.  Chart has been reviewed.  LP is being performed due  to altered mental status, rule out infectious etiology.  Procedure has been explained to patient/family including risks and benefits.  Consent has been signed by patient/family and witnessed.   Blood pressure 165/45, pulse 97, temperature 98.5 F (36.9 C), temperature source Oral, resp. rate 22, height 5\' 2"  (1.575 m), weight 68.7 kg (151 lb 7.3 oz), SpO2 94 %.   Current facility-administered medications:  .  0.9 %  sodium chloride infusion, 75 mL/hr, Intravenous, Continuous, Allyne Gee, MD, Last Rate: 75 mL/hr at 01/22/15 2300, 75 mL/hr at 01/22/15 2300 .  acetaminophen (TYLENOL) tablet 650 mg, 650 mg, Oral, Q6H PRN **OR** acetaminophen (TYLENOL) suppository 650 mg, 650 mg, Rectal, Q6H PRN, Allyne Gee, MD, 650 mg at 01/23/15 0054 .  cloNIDine (CATAPRES) tablet 0.3 mg, 0.3 mg, Oral, BID, Allyne Gee, MD, 0.3 mg at 01/22/15 2156 .  docusate sodium (COLACE) capsule 100 mg, 100 mg, Oral, BID, Allyne Gee, MD, 100 mg at 01/22/15 2200 .  folic acid (FOLVITE) tablet 1 mg, 1 mg, Oral, Daily, Allyne Gee, MD, 1 mg at 01/22/15 2201 .  heparin injection 5,000 Units, 5,000 Units, Subcutaneous, 3 times per day, Allyne Gee, MD, 5,000 Units at 01/23/15 [phone] .  hydrALAZINE (APRESOLINE) injection 5 mg, 5 mg, Intravenous, Q6H, Allie Bossier, MD .  insulin aspart (novoLOG) injection 0-15 Units, 0-15 Units, Subcutaneous, 6 times per day, Allyne Gee, MD, 2 Units at 01/23/15 [phone] .  LORazepam (ATIVAN) injection 1-2 mg, 1-2 mg, Intravenous, Q2H PRN, Allyne Gee, MD, 2 mg at 01/23/15 1219 .  LORazepam (ATIVAN) injection 2 mg, 2 mg, Intravenous, Once, Allie Bossier, MD .  multivitamin with minerals tablet 1 tablet, 1 tablet, Oral, Daily, Allyne Gee, MD, 1 tablet at 01/22/15 2201 .  nitroGLYCERIN (NITROGLYN) 2 % ointment 0.5 inch, 0.5 inch, Topical, 4 times per day, Allyne Gee, MD, 0.5  inch at 01/23/15 0602 .  ondansetron (ZOFRAN) tablet 4 mg, 4 mg, Oral, Q6H PRN **OR** ondansetron (ZOFRAN) injection 4 mg, 4 mg, Intravenous, Q6H PRN, Allyne Gee, MD .  pantoprazole (PROTONIX) EC tablet 40 mg, 40 mg, Oral, Daily, Allyne Gee, MD, 40 mg at 01/22/15 1930 .  phenytoin (DILANTIN) injection 100 mg, 100 mg, Intravenous, 3 times per day, Romona Curls, RPH, 100 mg at 01/23/15 1047 .  polyethylene glycol (MIRALAX / GLYCOLAX) packet 17 g, 17 g, Oral, Daily PRN, Allyne Gee, MD .  pravastatin (PRAVACHOL) tablet 40 mg, 40 mg, Oral, q1800, Allyne Gee, MD .  sertraline (ZOLOFT) tablet 100 mg, 100 mg, Oral, Daily, Allyne Gee, MD, 100 mg at 01/22/15 2156 .  sodium chloride 0.9 % injection 3 mL, 3 mL, Intravenous, Q12H, Allyne Gee, MD, 3 mL at 01/22/15 2202 .  thiamine (VITAMIN B-1) tablet 100 mg, 100 mg, Oral, Daily, Allyne Gee, MD, 100 mg at 01/22/15 2201 .  white petrolatum (VASELINE) gel, , , ,    Recent Labs  01/22/15 2125 01/23/15 0306  WBC 13.7* 19.1*  HGB 10.1* 10.4*  HCT 31.7* 32.2*  PLT 157 227  INR 1.24  --     CT of head:  Patient was placed in the lateral decub/sitting position.  Area was cleaned with betadine and anesthetized with lidocaine.  Under sterile conditions 20G LP needle was placed at approximately  L3-4. Patient remained agitated/restless during procedure. After multiple attempts unable to obtain fluid. Will plan for LP under fluoro.    Jim Like, DO [facility] [phone]  If 7pm- 7am, please page neurology on call as listed in [place].

## 2015-01-23 NOTE — Progress Notes (Signed)
[place] TEAM 1 - Stepdown/ICU TEAM Progress Note  Stacy Smith [id number] DOB: [birth year] DOA: 01/22/2015 PCP: No primary care provider on file.  Admit HPI / Brief Narrative: Stacy Smith is a 69 y.o. WF PMHx  DM Type II , CKD, HTN, Lupus RA,  Presents as a transfer from [facility] for new onset seizures and altered mental status. Patient presented to [facility] from home. Patients husband states that around 12 PM today she was found on the floor. Patients family called EMS. He states that he asked her to squeeze his finger and she was able to do that as he was concerned for stroke. Patients daughter states that she saw the patient having a seizure which lasted about 30 seconds after which she started to snore and grunt and went limp. She did not vomit and she lost control of her bowel and bladder in the ambulance. Her husband states that she has been sick 3 times with a slow heart beat and also CHF. Patient was admitted at that hospital at that time. She has had a problem with hyperkalemia in the past. She also has CKD III and is a diabetic. She has had a CT scan done in the past which acording to the daughter showed a blood clot which had "sealed off" The daughter states that she also believes that she sees her dead parents and she is also seeing other things in the rooms that are not there.   HPI/Subjective: 9/9 very sleepy, but arousable A/O 1 (does not know where, when, why), will follow some commands  Assessment/Plan: New onset seizures/Altered Mental Status -possibly related to not taking her blood pressure meds and elevated BP noted  -currently she is seizure free but is quite confused. She will need a neurology evaluation and MRI MRA -will defer to neurology regarding any antiseizure medications -EEG pending -frequent neuro checks  -Brain MRI pending  -LP pending  Diabetes Mellitus type 2 -she is on Metformin and Levemir at home will hold while patient is  NPO -Control with moderate SSI -Hemoglobin A1c pending   Essential HTN -Hydralazine IV 5 mg QID -Allow permissive HTN (CVA?); SBP goal 140-160 -Increase clonidine 0.3 mg TID -Increase hydralazine IV 10 mg QID  Hyperlipidemia -will continue with pravachol 40 mg daily -check lipid panel  Rheumatoid Arthritis/Lupus -will monitor -recheck sed rate  CKD III (unknown baseline) -Currently stable continue to monitor   Hypokalemia -Potassium 10 mEq 4 runs -Recheck at 1300; WNL   Hypomagnesemia -Magnesium IV 3 g -recheck at 1300    Code Status: FULL Family Communication: no family present at time of exam Disposition Plan: Per neurology    Consultants: Dr.Peter Lincoln Brigham (neuro hospitalist  Procedure/Significant Events:    Culture   Antibiotics: NA  DVT prophylaxis: Heparin subcutaneous   Devices    LINES / TUBES:      Continuous Infusions: . sodium chloride 75 mL/hr (01/22/15 2300)    Objective: VITAL SIGNS: Temp: 100.4 F (38 C) (09/09 2000) Temp Source: Oral (09/09 2000) BP: 144/109 mmHg (09/09 2000) Pulse Rate: 93 (09/09 2000) SPO2; FIO2:   Intake/Output Summary (Last 24 hours) at 01/23/15 2100 Last data filed at 01/23/15 1901  Gross per 24 hour  Intake 613.75 ml  Output   1750 ml  Net -1136.25 ml     Exam: General: very sleepy, but arousable A/O 1 (does not know where, when, why), will follow some commands, No acute respiratory distress Eyes: Unable to fully assess,,negative scleral  hemorrhage ENT: Negative Runny nose, negative gingival bleeding, Neck:  Negative scars, masses, torticollis, lymphadenopathy, JVD Lungs: Clear to auscultation bilaterally without wheezes or crackles Cardiovascular: Regular rate and rhythm without murmur gallop or rub normal S1 and S2 Abdomen:negative abdominal pain, negative dysphagia, nondistended, positive soft, bowel sounds, no rebound, no ascites, no appreciable mass Extremities: No significant  cyanosis, clubbing, or edema bilateral lower extremities Psychiatric:  Unable to fully assess  Neurologic:  Unable to fully assess    Data Reviewed: Basic Metabolic Panel:  Recent Labs Lab 01/22/15 2125 01/23/15 0306 01/23/15 0950 01/23/15 1245  NA  --  140  --   --   K  --  3.1*  --  3.8  CL  --  105  --   --   CO2  --  23  --   --   GLUCOSE  --  179*  --   --   BUN  --  9  --   --   CREATININE 1.25* 1.25*  --   --   CALCIUM  --  9.1  --   --   MG  --  1.6* 2.8* 2.8*  PHOS  --  1.9* 1.7*  --    Liver Function Tests:  Recent Labs Lab 01/22/15 2125 01/23/15 0306  AST 43* 49*  ALT 17 16  ALKPHOS 121 108  BILITOT 1.4* 0.9  PROT 7.0 7.2  ALBUMIN 3.5 3.4*   No results for input(s): LIPASE, AMYLASE in the last 168 hours.  Recent Labs Lab 01/23/15 0950  AMMONIA 61*   CBC:  Recent Labs Lab 01/22/15 2125 01/23/15 0306  WBC 13.7* 19.1*  HGB 10.1* 10.4*  HCT 31.7* 32.2*  MCV 81.3 81.3  PLT 157 227   Cardiac Enzymes:  Recent Labs Lab 01/22/15 2125 01/23/15 0304 01/23/15 0950  TROPONINI 0.35* 0.29* 0.21*   BNP (last 3 results) No results for input(s): BNP in the last 8760 hours.  ProBNP (last 3 results) No results for input(s): PROBNP in the last 8760 hours.  CBG:  Recent Labs Lab 01/22/15 2340 01/23/15 0507 01/23/15 0816 01/23/15 1243 01/23/15 2007  GLUCAP 304* 158* 143* 115* 169*    Recent Results (from the past 240 hour(s))  MRSA PCR Screening     Status: None   Collection Time: 01/22/15  6:45 PM  Result Value Ref Range Status   MRSA by PCR NEGATIVE NEGATIVE Final    Comment:        The GeneXpert MRSA Assay (FDA approved for NASAL specimens only), is one component of a comprehensive MRSA colonization surveillance program. It is not intended to diagnose MRSA infection nor to guide or monitor treatment for MRSA infections.   CSF culture     Status: None (Preliminary result)   Collection Time: 01/23/15  2:10 PM  Result Value Ref  Range Status   Specimen Description CSF  Final   Special Requests TUBE 1  Final   Gram Stain CYTOSPIN SMEAR NO WBC SEEN NO ORGANISMS SEEN   Final   Culture PENDING  Incomplete   Report Status PENDING  Incomplete     Studies:  Recent x-ray studies have been reviewed in detail by the Attending Physician  Scheduled Meds:  Scheduled Meds: . cloNIDine  0.3 mg Oral TID  . docusate sodium  100 mg Oral BID  . folic acid  1 mg Oral Daily  . heparin  5,000 Units Subcutaneous 3 times per day  . hydrALAZINE  10 mg Intravenous  Q6H  . insulin aspart  0-15 Units Subcutaneous 6 times per day  . midazolam  2 mg Intravenous Once  . multivitamin with minerals  1 tablet Oral Daily  . nitroGLYCERIN  0.5 inch Topical 4 times per day  . pantoprazole  40 mg Oral Daily  . phenytoin (DILANTIN) IV  100 mg Intravenous 3 times per day  . pravastatin  40 mg Oral q1800  . sertraline  100 mg Oral Daily  . sodium chloride  3 mL Intravenous Q12H  . thiamine  100 mg Oral Daily  . white petrolatum        Time spent on care of this patient: 40 mins   Javar Eshbach, Geraldo Docker , MD  [facility] Office  [phone] Pager [phone]  On-Call/Text Page:      [email or web address]      password [email or web address]  If 7PM-7AM, please contact night-coverage [email or web address] Password [email or web address] 01/23/2015, 9:00 PM   LOS: 1 day   Care during the described time interval was provided by me .  I have reviewed this patient's available data, including medical history, events of note, physical examination, and all test results as part of my evaluation. I have personally reviewed and interpreted all radiology studies.   Dia Crawford, MD [phone] Pager

## 2015-01-23 NOTE — Progress Notes (Signed)
unavail for EEG at this time. Has other test schedule. Will check in later

## 2015-01-23 NOTE — Progress Notes (Signed)
Subjective: Remains agitated. Given ativan overnight for agitation.   Objective: Current vital signs: BP 165/45 mmHg  Pulse 97  Temp(Src) 98.8 F (37.1 C) (Oral)  Resp 22  Ht 5\' 2"  (1.575 m)  Wt 68.7 kg (151 lb 7.3 oz)  BMI 27.69 kg/m2  SpO2 94% Vital signs in last 24 hours: Temp:  [98.8 F (37.1 C)-100.6 F (38.1 C)] 98.8 F (37.1 C) (09/09 0713) Pulse Rate:  [93-127] 97 (09/09 0700) Resp:  [13-26] 22 (09/09 0700) BP: (123-220)/(45-137) 165/45 mmHg (09/09 0700) SpO2:  [90 %-99 %] 94 % (09/09 0700) Weight:  [68.7 kg (151 lb 7.3 oz)-70.1 kg (154 lb 8.7 oz)] 68.7 kg (151 lb 7.3 oz) (09/09 0317)  Intake/Output from previous day: 09/08 0701 - 09/09 0700 In: 613.8 [I.V.:613.8] Out: 1400 [Urine:1400] Intake/Output this shift:   Nutritional status: Diet NPO time specified  Neurologic Exam: Alert and agitated. Patient oriented x 0 with nonsensical speech. Speech aphasic, incomprehensible at times, some paraphasic errors noted.  Cranial Nerves: II: Blinks to bilateral confrontation, pupils equal, round, reactive to light  III,IV, VI: ptosis not present, extra-ocular motions intact bilaterally V,VII: smile symmetric, facial light touch sensation normal bilaterally Motor: Moves all extremities strongly against gravity. No focal weakness noted.   Lab Results: Basic Metabolic Panel:  Recent Labs Lab 01/22/15 2125 01/23/15 0306  NA  --  140  K  --  3.1*  CL  --  105  CO2  --  23  GLUCOSE  --  179*  BUN  --  9  CREATININE 1.25* 1.25*  CALCIUM  --  9.1  MG  --  1.6*  PHOS  --  1.9*    Liver Function Tests:  Recent Labs Lab 01/22/15 2125 01/23/15 0306  AST 43* 49*  ALT 17 16  ALKPHOS 121 108  BILITOT 1.4* 0.9  PROT 7.0 7.2  ALBUMIN 3.5 3.4*   No results for input(s): LIPASE, AMYLASE in the last 168 hours. No results for input(s): AMMONIA in the last 168 hours.  CBC:  Recent Labs Lab 01/22/15 2125 01/23/15 0306  WBC 13.7* 19.1*  HGB 10.1* 10.4*   HCT 31.7* 32.2*  MCV 81.3 81.3  PLT 157 227    Cardiac Enzymes:  Recent Labs Lab 01/22/15 2125 01/23/15 0304  TROPONINI 0.35* 0.29*    Lipid Panel: No results for input(s): CHOL, TRIG, HDL, CHOLHDL, VLDL, LDLCALC in the last 168 hours.  CBG:  Recent Labs Lab 01/22/15 2115 01/22/15 2340 01/23/15 0507  GLUCAP 332* 304* 158*    Microbiology: Results for orders placed or performed during the hospital encounter of 01/22/15  MRSA PCR Screening     Status: None   Collection Time: 01/22/15  6:45 PM  Result Value Ref Range Status   MRSA by PCR NEGATIVE NEGATIVE Final    Comment:        The GeneXpert MRSA Assay (FDA approved for NASAL specimens only), is one component of a comprehensive MRSA colonization surveillance program. It is not intended to diagnose MRSA infection nor to guide or monitor treatment for MRSA infections.     Coagulation Studies:  Recent Labs  01/22/15 2125  LABPROT 15.7*  INR 1.24    Imaging: No results found.  Medications:  Scheduled: . cloNIDine  0.3 mg Oral BID  . docusate sodium  100 mg Oral BID  . folic acid  1 mg Oral Daily  . heparin  5,000 Units Subcutaneous 3 times per day  . hydrALAZINE  50 mg  Oral 3 times per day  . insulin aspart  0-15 Units Subcutaneous 6 times per day  . insulin detemir  80 Units Subcutaneous QHS  . magnesium sulfate 1 - 4 g bolus IVPB  3 g Intravenous Once  . metFORMIN  1,000 mg Oral BID WC  . multivitamin with minerals  1 tablet Oral Daily  . nitroGLYCERIN  0.5 inch Topical 4 times per day  . pantoprazole  40 mg Oral Daily  . phenytoin (DILANTIN) IV  100 mg Intravenous 3 times per day  . potassium chloride  10 mEq Intravenous Q1 Hr x 4  . pravastatin  40 mg Oral q1800  . sertraline  100 mg Oral Daily  . sodium chloride  3 mL Intravenous Q12H  . thiamine  100 mg Oral Daily    Assessment/Plan:  69 year old female with altered mental status. Was noted by family to have a seizure on day of  admission. Does not have a history of seizures. On neurological examination not only is the patient confused, but appears to be aphasic as well. Is hypertensive, likely due to noncompliance. Can not rule out PRES as etiology of all presenting symptoms but other things of concern in the differential include metabolic encephalopathy, intracranial lesion, CNS lupus, prolonged post-ictal state, etc, Lab work from [place] reviewed and multiple metabolic issues are present. Head CT performed at [facility] shows no evidence of hemorrhage.   -EEG -MRI, will require conscious sedation -check ammonia -continue dilantin per pharmacy recs -further workup pending above results   LOS: 1 day   Jim Like, DO [facility] [phone]  If 7pm- 7am, please page neurology on call as listed in AMION. 01/23/2015  8:14 AM

## 2015-01-24 ENCOUNTER — Inpatient Hospital Stay (HOSPITAL_COMMUNITY): Payer: Medicare Other

## 2015-01-24 DIAGNOSIS — E119 Type 2 diabetes mellitus without complications: Secondary | ICD-10-CM | POA: Diagnosis present

## 2015-01-24 LAB — HEMOGLOBIN A1C
HEMOGLOBIN A1C: 9.6 % — AB (ref 4.8–5.6)
Mean Plasma Glucose: 229 mg/dL

## 2015-01-24 LAB — GLUCOSE, CAPILLARY
GLUCOSE-CAPILLARY: 109 mg/dL — AB (ref 65–99)
GLUCOSE-CAPILLARY: 186 mg/dL — AB (ref 65–99)
GLUCOSE-CAPILLARY: 237 mg/dL — AB (ref 65–99)
Glucose-Capillary: 126 mg/dL — ABNORMAL HIGH (ref 65–99)
Glucose-Capillary: 182 mg/dL — ABNORMAL HIGH (ref 65–99)
Glucose-Capillary: 335 mg/dL — ABNORMAL HIGH (ref 65–99)

## 2015-01-24 LAB — CBC WITH DIFFERENTIAL/PLATELET
BASOS ABS: 0 10*3/uL (ref 0.0–0.1)
BASOS PCT: 0 % (ref 0–1)
EOS ABS: 0 10*3/uL (ref 0.0–0.7)
EOS PCT: 0 % (ref 0–5)
HCT: 30.5 % — ABNORMAL LOW (ref 36.0–46.0)
Hemoglobin: 9.8 g/dL — ABNORMAL LOW (ref 12.0–15.0)
LYMPHS PCT: 19 % (ref 12–46)
Lymphs Abs: 1.9 10*3/uL (ref 0.7–4.0)
MCH: 26.5 pg (ref 26.0–34.0)
MCHC: 32.1 g/dL (ref 30.0–36.0)
MCV: 82.4 fL (ref 78.0–100.0)
MONO ABS: 0.5 10*3/uL (ref 0.1–1.0)
Monocytes Relative: 5 % (ref 3–12)
Neutro Abs: 7.5 10*3/uL (ref 1.7–7.7)
Neutrophils Relative %: 76 % (ref 43–77)
PLATELETS: 205 10*3/uL (ref 150–400)
RBC: 3.7 MIL/uL — AB (ref 3.87–5.11)
RDW: 16.1 % — AB (ref 11.5–15.5)
WBC: 9.9 10*3/uL (ref 4.0–10.5)

## 2015-01-24 LAB — COMPREHENSIVE METABOLIC PANEL
ALBUMIN: 3 g/dL — AB (ref 3.5–5.0)
ALT: 15 U/L (ref 14–54)
AST: 30 U/L (ref 15–41)
Alkaline Phosphatase: 96 U/L (ref 38–126)
Anion gap: 8 (ref 5–15)
BUN: 10 mg/dL (ref 6–20)
CHLORIDE: 110 mmol/L (ref 101–111)
CO2: 23 mmol/L (ref 22–32)
CREATININE: 1.26 mg/dL — AB (ref 0.44–1.00)
Calcium: 8.5 mg/dL — ABNORMAL LOW (ref 8.9–10.3)
GFR calc Af Amer: 49 mL/min — ABNORMAL LOW (ref >60)
GFR, EST NON AFRICAN AMERICAN: 42 mL/min — AB (ref >60)
Glucose, Bld: 158 mg/dL — ABNORMAL HIGH (ref 65–99)
POTASSIUM: 3.5 mmol/L (ref 3.5–5.1)
SODIUM: 141 mmol/L (ref 135–145)
Total Bilirubin: 1.1 mg/dL (ref 0.3–1.2)
Total Protein: 6.3 g/dL — ABNORMAL LOW (ref 6.5–8.1)

## 2015-01-24 LAB — HERPES SIMPLEX VIRUS(HSV) DNA BY PCR
HSV 1 DNA: NEGATIVE
HSV 2 DNA: NEGATIVE

## 2015-01-24 LAB — C-REACTIVE PROTEIN: CRP: 4.8 mg/dL — ABNORMAL HIGH (ref <1.0)

## 2015-01-24 LAB — SEDIMENTATION RATE: Sed Rate: 60 mm/hr — ABNORMAL HIGH (ref 0–22)

## 2015-01-24 LAB — MAGNESIUM: MAGNESIUM: 2.3 mg/dL (ref 1.7–2.4)

## 2015-01-24 LAB — PHENYTOIN LEVEL, TOTAL: PHENYTOIN LVL: 21.7 ug/mL — AB (ref 10.0–20.0)

## 2015-01-24 MED ORDER — CETYLPYRIDINIUM CHLORIDE 0.05 % MT LIQD
7.0000 mL | Freq: Two times a day (BID) | OROMUCOSAL | Status: DC
  Administered 2015-01-24 – 2015-01-25 (×3): 7 mL via OROMUCOSAL

## 2015-01-24 MED ORDER — INSULIN ASPART 100 UNIT/ML ~~LOC~~ SOLN
0.0000 [IU] | Freq: Three times a day (TID) | SUBCUTANEOUS | Status: DC
  Administered 2015-01-25: 5 [IU] via SUBCUTANEOUS

## 2015-01-24 MED ORDER — CHLORHEXIDINE GLUCONATE 0.12 % MT SOLN
15.0000 mL | Freq: Two times a day (BID) | OROMUCOSAL | Status: DC
  Administered 2015-01-24 – 2015-01-26 (×6): 15 mL via OROMUCOSAL
  Filled 2015-01-24 (×8): qty 15

## 2015-01-24 MED ORDER — INSULIN ASPART 100 UNIT/ML ~~LOC~~ SOLN
0.0000 [IU] | Freq: Every day | SUBCUTANEOUS | Status: DC
  Administered 2015-01-24: 4 [IU] via SUBCUTANEOUS
  Administered 2015-01-25 – 2015-01-26 (×2): 2 [IU] via SUBCUTANEOUS

## 2015-01-24 MED ORDER — INSULIN DETEMIR 100 UNIT/ML ~~LOC~~ SOLN
10.0000 [IU] | Freq: Every day | SUBCUTANEOUS | Status: DC
  Administered 2015-01-24: 10 [IU] via SUBCUTANEOUS
  Filled 2015-01-24 (×2): qty 0.1

## 2015-01-24 MED ORDER — GADOBENATE DIMEGLUMINE 529 MG/ML IV SOLN
20.0000 mL | Freq: Once | INTRAVENOUS | Status: AC | PRN
  Administered 2015-01-24: 20 mL via INTRAVENOUS

## 2015-01-24 NOTE — Progress Notes (Signed)
MEDICATION RELATED CONSULT NOTE - Follow-Up  Pharmacy Consult for phenytoin IV Indication: new-onset seizures  Allergies  Allergen Reactions  . Latex     Patient Measurements: Height: 5\' 2"  (157.5 cm) Weight: 151 lb 7.3 oz (68.7 kg) IBW/kg (Calculated) : 50.1 Adjusted BW: 58.1kg  Vital Signs: Temp: 99 F (37.2 C) (09/10 1221) Temp Source: Oral (09/10 1221) BP: 155/69 mmHg (09/10 1200) Pulse Rate: 89 (09/10 1200) Intake/Output from previous day: 09/09 0701 - 09/10 0700 In: 0  Out: 350 [Urine:350] Intake/Output from this shift: Total I/O In: -  Out: 500 [Urine:500]  Labs:  Recent Labs  01/22/15 2125  01/23/15 0306 01/23/15 0950 01/23/15 1245 01/24/15 0219  WBC 13.7*  --  19.1*  --   --  9.9  HGB 10.1*  --  10.4*  --   --  9.8*  HCT 31.7*  --  32.2*  --   --  30.5*  PLT 157  --  227  --   --  205  CREATININE 1.25*  --  1.25*  --   --  1.26*  MG  --   < > 1.6* 2.8* 2.8* 2.3  PHOS  --   --  1.9* 1.7*  --   --   ALBUMIN 3.5  --  3.4*  --   --  3.0*  PROT 7.0  --  7.2  --   --  6.3*  AST 43*  --  49*  --   --  30  ALT 17  --  16  --   --  15  ALKPHOS 121  --  108  --   --  96  BILITOT 1.4*  --  0.9  --   --  1.1  BILIDIR 0.3  --   --   --   --   --   IBILI 1.1*  --   --   --   --   --   < > = values in this interval not displayed. Estimated Creatinine Clearance: 38.3 mL/min (by C-G formula based on Cr of 1.26).   Microbiology: Recent Results (from the past 720 hour(s))  MRSA PCR Screening     Status: None   Collection Time: 01/22/15  6:45 PM  Result Value Ref Range Status   MRSA by PCR NEGATIVE NEGATIVE Final    Comment:        The GeneXpert MRSA Assay (FDA approved for NASAL specimens only), is one component of a comprehensive MRSA colonization surveillance program. It is not intended to diagnose MRSA infection nor to guide or monitor treatment for MRSA infections.   CSF culture     Status: None (Preliminary result)   Collection Time: 01/23/15   2:10 PM  Result Value Ref Range Status   Specimen Description CSF  Final   Special Requests TUBE 1  Final   Gram Stain CYTOSPIN SMEAR NO WBC SEEN NO ORGANISMS SEEN   Final   Culture PENDING  Incomplete   Report Status PENDING  Incomplete    Medical History: No past medical history on file.  Assessment: 69 y.o. female with prior history of DM Type II, CKD, HTN, Lupus, and RA presents as a transfer from [facility] for new onset seizures and altered mental status. Pharmacy consulted to dose phenytoin IV and to assess for drug interactions. Adjusted BW used due to TBW> 120% of IBW. SCr 1.26, CrCl 38, LDL 131, TG 212, Albumin 3.0, AST/ALT wnl  9/10 Phenytoin level = 21.7,  corrects to 31 d/t low albumin; was on 100 mg IV q8h  Plan:  -Hold today's doses -Recheck tomorrow morning and start at 200 mg daily if < 20 -Obtain a level 5-7 days after therapy initiation or adjustments to assess trend in concentration  Governor Specking, PharmD Clinical Pharmacy Resident Pager: [phone]  01/24/2015 1:11 PM

## 2015-01-24 NOTE — Progress Notes (Signed)
[place] TEAM 1 - Stepdown/ICU TEAM Progress Note  Diesha Bullough [id number] DOB: Jul 02, 1945 DOA: 01/22/2015 PCP: No primary care provider on file.  Admit HPI / Brief Narrative: Stacy Smith is a 69 y.o. WF PMHx  DM Type II , CKD, HTN, Lupus RA,  Presents as a transfer from [facility] for new onset seizures and altered mental status. Patient presented [facility] from home. Patients husband states that around 12 PM today she was found on the floor. Patients family called EMS. He states that he asked her to squeeze his finger and she was able to do that as he was concerned for stroke. Patients daughter states that she saw the patient having a seizure which lasted about 30 seconds after which she started to snore and grunt and went limp. She did not vomit and she lost control of her bowel and bladder in the ambulance. Her husband states that she has been sick 3 times with a slow heart beat and also CHF. Patient was admitted at that hospital at that time. She has had a problem with hyperkalemia in the past. She also has CKD III and is a diabetic. She has had a CT scan done in the past which acording to the daughter showed a blood clot which had "sealed off" The daughter states that she also believes that she sees her dead parents and she is also seeing other things in the rooms that are not there.   HPI/Subjective: 9/10 A/O 3 (does not recall why), states that she's had previous seizures in September 2015 treated at [facility]. States she also had a clot in her brain (CVA) 2 months ago also treated at [place] she recalls not feeling well and communicated this to her daughter, which is the last thing she remembers prior to today.   Assessment/Plan: New onset seizures/Altered Mental Status (delirium) -possibly related to not taking her blood pressure meds and elevated BP noted  -Seizure-free, cognition almost back to baseline. 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 Will start patient on 10 units here. -Moderate SSI and Levemir at home will hold while patient is NPO -Control with moderate SSI -Hemoglobin A1c pending   Essential HTN -Hydralazine IV 5 mg QID -Increase clonidine 0.3 mg TID -Increase hydralazine IV 10 mg QID  Hyperlipidemia -will continue with pravachol 40 mg daily -check lipid panel  Rheumatoid Arthritis/Lupus -will monitor -recheck sed rate  CKD III (unknown baseline) -Currently stable continue to monitor   Hypokalemia -WNL  Hypomagnesemia -WNL    Code Status: FULL Family Communication: no family present at time of exam Disposition Plan: Per neurology    Consultants: Dr.Peter Lincoln Brigham (neuro hospitalist  Procedure/Significant Events: 9/10 EEG; abnormal mild to moderate cerebral disturbance (encephalopathy).  9/10 MRI brain with/without contrast;. No acute intracranial abnormality. 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 - Age advanced white matter disease. Likely reflects sequela of chronic microvascular ischemia.    Culture   Antibiotics: NA  DVT prophylaxis: Heparin subcutaneous   Devices    LINES / TUBES:      Continuous Infusions: . sodium chloride 75 mL/hr (01/24/15 1800)    Objective: VITAL SIGNS: Temp: 98.7 F (37.1 C) (09/10 1515) Temp Source: Oral (09/10 1515) BP: 146/51 mmHg (09/10 1800) Pulse Rate: 99 (09/10 1300) SPO2;  FIO2:   Intake/Output Summary (Last 24 hours) at 01/24/15 1916 Last data filed at 01/24/15 1800  Gross per 24 hour  Intake   3060 ml  Output    575 ml  Net   2485 ml     Exam: General: A/O 3 (does not recall why),follow all commands, No acute respiratory distress Eyes: Pupils equal round reactive to light and accommodation  negative scleral hemorrhage ENT: Negative Runny nose, negative gingival bleeding, Neck:  Negative scars, masses, torticollis, lymphadenopathy, JVD Lungs: Clear to auscultation bilaterally without wheezes or crackles Cardiovascular: Regular rate and rhythm without murmur gallop or rub normal S1 and S2 Abdomen:negative abdominal pain, negative dysphagia, nondistended, positive soft, bowel sounds, no rebound, no ascites, no appreciable mass Extremities: No significant cyanosis, clubbing, or edema bilateral lower extremities Psychiatric:  Negative depression, negative anxiety, negative fatigue, negative mania  Neurologic:  Cranial nerves II through XII intact, tongue/uvula midline, all extremities muscle strength 5/5, sensation intact throughout, negative dysarthria, negative expressive aphasia, negative receptive aphasia.   Data Reviewed: Basic Metabolic Panel:  Recent Labs Lab 01/22/15 2125 01/23/15 0306 01/23/15 0950 01/23/15 1245 01/24/15 0219  NA  --  140  --   --  141  K  --  3.1*  --  3.8 3.5  CL  --  105  --   --  110  CO2  --  23  --   --  23  GLUCOSE  --  179*  --   --  158*  BUN  --  9  --   --  10  CREATININE 1.25* 1.25*  --   --  1.26*  CALCIUM  --  9.1  --   --  8.5*  MG  --  1.6* 2.8* 2.8* 2.3  PHOS  --  1.9* 1.7*  --   --    Liver Function Tests:  Recent Labs Lab 01/22/15 2125 01/23/15 0306 01/24/15 0219  AST 43* 49* 30  ALT 17 16 15   ALKPHOS 121 108 96  BILITOT 1.4* 0.9 1.1  PROT 7.0 7.2 6.3*  ALBUMIN 3.5 3.4* 3.0*   No results for input(s): LIPASE, AMYLASE in the last 168 hours.  Recent Labs Lab 01/23/15 0950  AMMONIA 61*   CBC:  Recent Labs Lab 01/22/15 2125 01/23/15 0306 01/24/15 0219  WBC 13.7* 19.1* 9.9  NEUTROABS  --   --  7.5  HGB 10.1* 10.4* 9.8*  HCT 31.7* 32.2* 30.5*  MCV 81.3 81.3 82.4  PLT 157 227 205   Cardiac Enzymes:  Recent Labs Lab 01/22/15 2125 01/23/15 0304 01/23/15 0950  TROPONINI 0.35* 0.29* 0.21*   BNP  (last 3 results) No results for input(s): BNP in the last 8760 hours.  ProBNP (last 3 results) No results for input(s): PROBNP in the last 8760 hours.  CBG:  Recent Labs Lab 01/24/15 0043 01/24/15 0420 01/24/15 0734 01/24/15 1214 01/24/15 1615  GLUCAP 186* 109* 126* 182* 237*    Recent Results (from the past 240 hour(s))  MRSA PCR Screening     Status: None   Collection Time: 01/22/15  6:45 PM  Result Value Ref Range Status   MRSA by PCR NEGATIVE NEGATIVE Final    Comment:        The GeneXpert MRSA Assay (FDA approved for NASAL specimens only), is one component of a comprehensive MRSA colonization surveillance program. It is not intended to diagnose MRSA infection nor to guide or monitor treatment for MRSA infections.   CSF culture  Status: None (Preliminary result)   Collection Time: 01/23/15  2:10 PM  Result Value Ref Range Status   Specimen Description CSF  Final   Special Requests TUBE 1  Final   Gram Stain CYTOSPIN SMEAR NO WBC SEEN NO ORGANISMS SEEN   Final   Culture NO GROWTH 1 DAY  Final   Report Status PENDING  Incomplete     Studies:  Recent x-ray studies have been reviewed in detail by the Attending Physician  Scheduled Meds:  Scheduled Meds: . antiseptic oral rinse  7 mL Mouth Rinse q12n4p  . chlorhexidine  15 mL Mouth Rinse BID  . cloNIDine  0.3 mg Oral TID  . docusate sodium  100 mg Oral BID  . folic acid  1 mg Oral Daily  . heparin  5,000 Units Subcutaneous 3 times per day  . hydrALAZINE  10 mg Intravenous Q6H  . insulin aspart  0-15 Units Subcutaneous 6 times per day  . insulin detemir  10 Units Subcutaneous QHS  . midazolam  2 mg Intravenous Once  . multivitamin with minerals  1 tablet Oral Daily  . nitroGLYCERIN  0.5 inch Topical 4 times per day  . pantoprazole  40 mg Oral Daily  . pravastatin  40 mg Oral q1800  . sertraline  100 mg Oral Daily  . sodium chloride  3 mL Intravenous Q12H  . thiamine  100 mg Oral Daily    Time  spent on care of this patient: 40 mins   WOODS, Geraldo Docker , MD  [facility] Office  [phone] Pager - [phone]  On-Call/Text Page:      [email or web address]      password [email or web address]  If 7PM-7AM, please contact night-coverage [email or web address] Password [email or web address] 01/24/2015, 7:16 PM   LOS: 2 days   Care during the described time interval was provided by me .  I have reviewed this patient's available data, including medical history, events of note, physical examination, and all test results as part of my evaluation. I have personally reviewed and interpreted all radiology studies.   Dia Crawford, MD [phone] Pager

## 2015-01-24 NOTE — Evaluation (Signed)
Clinical/Bedside Swallow Evaluation Patient Details  Name: Stacy Smith MRN: [id number] Date of Birth: Jan 16, 1946  Today's Date: 01/24/2015 Time: SLP Start Time (ACUTE ONLY): 1140 SLP Stop Time (ACUTE ONLY): 1200 SLP Time Calculation (min) (ACUTE ONLY): 20 min  Past Medical History: No past medical history on file. Past Surgical History: No past surgical history on file. HPI:  Stacy Smith is a 69 y.o. female with prior history of DM Type II CKD HTN Lupus RA presents as a transfer from [facility] regional for new onset seizures and altered mental status. Patient has had recent visual hallucinations. Has been noncompliant with her antihypertensive medications   Assessment / Plan / Recommendation Clinical Impression  Clinical swallowing evaluation was completed.  The patient presented with mild oral dysphagia characterized by delayed oral transit.  This was most likely related to her lack of dentition.  The patient's husband reported that she eats whatever she wants to at home.  Swallow trigger was timely and hyo-laryngeal excursion appeared to be adequate.  Overt s/s of aspiration were not observed.  The only concern is the patient's alertness level.  Her decreased alertness increases the risk of aspiration.  Recommend a regular diet and thin liquids.  The patient should only eat/be fed when she is totally alert.  ST to follow for therapeutic diet tolerance.      Aspiration Risk  Mild    Diet Recommendation Age appropriate regular solids;Thin   Medication Administration: Whole meds with liquid Compensations: Slow rate;Small sips/bites (feed/eat only if alert)    Other  Recommendations Oral Care Recommendations: Oral care BID   Follow Up Recommendations    To be determined   Frequency and Duration min 2x/week  2 weeks   Pertinent Vitals/Pain 0/10 per patient.        Swallow Study Prior Functional Status       General Date of Onset: 01/22/15 Other Pertinent Information:  Stacy Smith is a 69 y.o. female with prior history of DM Type II CKD HTN Lupus RA presents as a transfer from [facility] regional for new onset seizures and altered mental status. Patient has had recent visual hallucinations. Has been noncompliant with her antihypertensive medications Type of Study: Bedside swallow evaluation Previous Swallow Assessment: None noted.  Diet Prior to this Study: NPO Temperature Spikes Noted: Yes Respiratory Status: Supplemental O2 delivered via (comment) History of Recent Intubation: No Behavior/Cognition: Alert;Cooperative;Pleasant mood;Requires cueing Oral Cavity - Dentition: Poor condition;Missing dentition (Pt has no lower and only 9 upper teeth.  ) Self-Feeding Abilities: Needs assist Patient Positioning: Upright in bed Baseline Vocal Quality: Low vocal intensity Volitional Cough: Strong Volitional Swallow: Able to elicit    Oral/Motor/Sensory Function Overall Oral Motor/Sensory Function: Appears within functional limits for tasks assessed Lingual Strength:  (unable to fully assess due to difficulty following commands)   Ice Chips Ice chips: Not tested   Thin Liquid Thin Liquid: Within functional limits Presentation: Cup;Spoon    Nectar Thick Nectar Thick Liquid: Not tested   Honey Thick Honey Thick Liquid: Not tested   Puree Puree: Within functional limits Presentation: Spoon   Solid   GO    Solid: Impaired Presentation: Spoon Oral Phase Impairments: Impaired anterior to posterior transit Oral Phase Functional Implications:  (prolonged oral transit)       Shelly Flatten N 01/24/2015,12:11 PM  Shelly Flatten, [place] SLP [phone]

## 2015-01-24 NOTE — Progress Notes (Signed)
EEG completed, results pending. 

## 2015-01-24 NOTE — Procedures (Signed)
ELECTROENCEPHALOGRAM REPORT   Patient: Stacy Smith      Room #: 3S-08 Age: 69 y.o.        Sex: female Referring Physician:Dr Sherral Hammers [facility] Report Date:  01/24/2015        Interpreting Physician: Hulen Luster  History: Stacy Smith is an 69 y.o. female admitted with AMS  Medications:  Scheduled: . cloNIDine  0.3 mg Oral TID  . docusate sodium  100 mg Oral BID  . folic acid  1 mg Oral Daily  . heparin  5,000 Units Subcutaneous 3 times per day  . hydrALAZINE  10 mg Intravenous Q6H  . insulin aspart  0-15 Units Subcutaneous 6 times per day  . midazolam  2 mg Intravenous Once  . multivitamin with minerals  1 tablet Oral Daily  . nitroGLYCERIN  0.5 inch Topical 4 times per day  . pantoprazole  40 mg Oral Daily  . pravastatin  40 mg Oral q1800  . sertraline  100 mg Oral Daily  . sodium chloride  3 mL Intravenous Q12H  . thiamine  100 mg Oral Daily    Conditions of Recording:  This is a 16 channel EEG carried out with the patient in the drowsy state.  Description:  The waking background activity consists of a low voltage, symmetrical, fairly well organized theta activity with intermittent alpha activity mixed in. No focal slowing or epileptiform activity is noted.   The patient drowses with slowing to irregular, low voltage theta and beta activity. Hyp[erventilation was not performed. Intermittent photic stimulation was not performed.   IMPRESSION: Abnormal EEG due to the presence of generalized slowing indicating a mild to moderate cerebral disturbance (encephalopathy).    Jim Like, DO [facility] [phone]  If 7pm- 7am, please page neurology on call as listed in [place]. 01/24/2015, 12:00 PM

## 2015-01-24 NOTE — Progress Notes (Addendum)
Subjective: Resting comfortably. More oriented this morning.   LP shows 10 RBC, 3 WBC, 50 protein. Corrected dilantin level 31.   Objective: Current vital signs: BP 134/48 mmHg  Pulse 89  Temp(Src) 97.9 F (36.6 C) (Oral)  Resp 19  Ht 5\' 2"  (1.575 m)  Wt 68.7 kg (151 lb 7.3 oz)  BMI 27.69 kg/m2  SpO2 95% Vital signs in last 24 hours: Temp:  [97.9 F (36.6 C)-100.4 F (38 C)] 97.9 F (36.6 C) (09/10 0415) Pulse Rate:  [81-112] 89 (09/10 0700) Resp:  [17-23] 19 (09/10 0700) BP: (114-204)/(37-179) 134/48 mmHg (09/10 0700) SpO2:  [93 %-97 %] 95 % (09/10 0700)  Intake/Output from previous day: 09/09 0701 - 09/10 0700 In: 0  Out: 350 [Urine:350] Intake/Output this shift:   Nutritional status: Diet NPO time specified Except for: Sips with Meds  Neurologic Exam: Resting comfortably, oriented to name, "hospital", unsure on date. Following simple commands. No signs of aphasia.  Cranial Nerves: II: Blinks to bilateral confrontation, pupils equal, round, reactive to light  III,IV, VI: ptosis not present, extra-ocular motions intact bilaterally V,VII: smile symmetric, facial light touch sensation normal bilaterally Motor: Moves all extremities strongly against gravity. No focal weakness noted.   Lab Results: Basic Metabolic Panel:  Recent Labs Lab 01/22/15 2125 01/23/15 0306 01/23/15 0950 01/23/15 1245 01/24/15 0219  NA  --  140  --   --  141  K  --  3.1*  --  3.8 3.5  CL  --  105  --   --  110  CO2  --  23  --   --  23  GLUCOSE  --  179*  --   --  158*  BUN  --  9  --   --  10  CREATININE 1.25* 1.25*  --   --  1.26*  CALCIUM  --  9.1  --   --  8.5*  MG  --  1.6* 2.8* 2.8* 2.3  PHOS  --  1.9* 1.7*  --   --     Liver Function Tests:  Recent Labs Lab 01/22/15 2125 01/23/15 0306 01/24/15 0219  AST 43* 49* 30  ALT 17 16 15   ALKPHOS 121 108 96  BILITOT 1.4* 0.9 1.1  PROT 7.0 7.2 6.3*  ALBUMIN 3.5 3.4* 3.0*   No results for input(s): LIPASE, AMYLASE in  the last 168 hours.  Recent Labs Lab 01/23/15 0950  AMMONIA 61*    CBC:  Recent Labs Lab 01/22/15 2125 01/23/15 0306 01/24/15 0219  WBC 13.7* 19.1* 9.9  NEUTROABS  --   --  7.5  HGB 10.1* 10.4* 9.8*  HCT 31.7* 32.2* 30.5*  MCV 81.3 81.3 82.4  PLT 157 227 205    Cardiac Enzymes:  Recent Labs Lab 01/22/15 2125 01/23/15 0304 01/23/15 0950  TROPONINI 0.35* 0.29* 0.21*    Lipid Panel:  Recent Labs Lab 01/23/15 2245  CHOL 197  TRIG 212*  HDL 24*  CHOLHDL 8.2  VLDL 42*  LDLCALC 131*    CBG:  Recent Labs Lab 01/23/15 0816 01/23/15 1243 01/23/15 2007 01/24/15 0043 01/24/15 0420  GLUCAP 143* 115* 169* 186* 109*    Microbiology: Results for orders placed or performed during the hospital encounter of 01/22/15  MRSA PCR Screening     Status: None   Collection Time: 01/22/15  6:45 PM  Result Value Ref Range Status   MRSA by PCR NEGATIVE NEGATIVE Final    Comment:  The GeneXpert MRSA Assay (FDA approved for NASAL specimens only), is one component of a comprehensive MRSA colonization surveillance program. It is not intended to diagnose MRSA infection nor to guide or monitor treatment for MRSA infections.   CSF culture     Status: None (Preliminary result)   Collection Time: 01/23/15  2:10 PM  Result Value Ref Range Status   Specimen Description CSF  Final   Special Requests TUBE 1  Final   Gram Stain CYTOSPIN SMEAR NO WBC SEEN NO ORGANISMS SEEN   Final   Culture PENDING  Incomplete   Report Status PENDING  Incomplete    Coagulation Studies:  Recent Labs  01/22/15 2125  LABPROT 15.7*  INR 1.24    Imaging: Dg Fluoro Guide Ndl Plcd/bx/inj/loc  01/23/2015   CLINICAL DATA:  Altered mental status.  Failed attempt on the floor.  EXAM: DIAGNOSTIC LUMBAR PUNCTURE UNDER FLUOROSCOPIC GUIDANCE  FLUOROSCOPY TIME:  Radiation Exposure Index (as provided by the fluoroscopic device): 7.2 mGy  If the device does not provide the exposure index:   Fluoroscopy Time (in minutes and seconds):  0.8 minutes  Number of Acquired Images:  None, only saved fluoroscopy  PROCEDURE: Informed consent was obtained from the patient's next of kin by the clinical service. The patient prone, the lower back was prepped with Betadine. The patient was extremely agitated, requiring multiple staff to contain her on the table. 1% Lidocaine was used for local anesthesia. Lumbar puncture was performed at the L3-4 level using a 20 gauge needle with return of clear CSF. Only 4 cc of CSF could be obtained before procedure had to be terminated due to patient extreme motion. There are no apparent complications.  IMPRESSION: Successful lumbar puncture. Due to marked agitation, only 4 cc could be obtained.   Electronically Signed   By: Monte Fantasia M.D.   On: 01/23/2015 15:04    Medications:  Scheduled: . cloNIDine  0.3 mg Oral TID  . docusate sodium  100 mg Oral BID  . folic acid  1 mg Oral Daily  . heparin  5,000 Units Subcutaneous 3 times per day  . hydrALAZINE  10 mg Intravenous Q6H  . insulin aspart  0-15 Units Subcutaneous 6 times per day  . midazolam  2 mg Intravenous Once  . multivitamin with minerals  1 tablet Oral Daily  . nitroGLYCERIN  0.5 inch Topical 4 times per day  . pantoprazole  40 mg Oral Daily  . pravastatin  40 mg Oral q1800  . sertraline  100 mg Oral Daily  . sodium chloride  3 mL Intravenous Q12H  . thiamine  100 mg Oral Daily    Assessment/Plan:  69 year old female with altered mental status. Was noted by family to have a seizure on day of admission. Does not have a history of seizures.Mental status appears to be slowly improving. Can not rule out PRES as etiology of all presenting symptoms but other things of concern in the differential include metabolic encephalopathy, intracranial lesion, CNS lupus, prolonged post-ictal state, etc,LP results unremarkable so far, low suspicion for CNS infection.  -hold dilantin for today. Recheck  tomorrow morning and start at 200mg  daily if < 20 -MRI, will require conscious sedation -will continue to follow    LOS: 2 days   Jim Like, DO [facility] [phone]  If 7pm- 7am, please page neurology on call as listed in [place]. 01/24/2015  8:26 AM

## 2015-01-25 ENCOUNTER — Inpatient Hospital Stay (HOSPITAL_COMMUNITY): Payer: Medicare Other

## 2015-01-25 DIAGNOSIS — R000 Tachycardia, unspecified: Secondary | ICD-10-CM | POA: Diagnosis present

## 2015-01-25 DIAGNOSIS — IMO0002 Reserved for concepts with insufficient information to code with codable children: Secondary | ICD-10-CM | POA: Diagnosis present

## 2015-01-25 DIAGNOSIS — R569 Unspecified convulsions: Secondary | ICD-10-CM | POA: Diagnosis present

## 2015-01-25 DIAGNOSIS — R079 Chest pain, unspecified: Secondary | ICD-10-CM

## 2015-01-25 DIAGNOSIS — E1165 Type 2 diabetes mellitus with hyperglycemia: Secondary | ICD-10-CM | POA: Diagnosis present

## 2015-01-25 DIAGNOSIS — I471 Supraventricular tachycardia: Secondary | ICD-10-CM

## 2015-01-25 DIAGNOSIS — R410 Disorientation, unspecified: Secondary | ICD-10-CM | POA: Diagnosis present

## 2015-01-25 DIAGNOSIS — I209 Angina pectoris, unspecified: Secondary | ICD-10-CM | POA: Diagnosis present

## 2015-01-25 DIAGNOSIS — I248 Other forms of acute ischemic heart disease: Secondary | ICD-10-CM | POA: Diagnosis present

## 2015-01-25 LAB — CBC WITH DIFFERENTIAL/PLATELET
Basophils Absolute: 0 10*3/uL (ref 0.0–0.1)
Basophils Relative: 0 % (ref 0–1)
Eosinophils Absolute: 0.1 10*3/uL (ref 0.0–0.7)
Eosinophils Relative: 2 % (ref 0–5)
HEMATOCRIT: 28 % — AB (ref 36.0–46.0)
HEMOGLOBIN: 8.8 g/dL — AB (ref 12.0–15.0)
LYMPHS ABS: 1.9 10*3/uL (ref 0.7–4.0)
LYMPHS PCT: 24 % (ref 12–46)
MCH: 26.5 pg (ref 26.0–34.0)
MCHC: 31.4 g/dL (ref 30.0–36.0)
MCV: 84.3 fL (ref 78.0–100.0)
MONOS PCT: 5 % (ref 3–12)
Monocytes Absolute: 0.4 10*3/uL (ref 0.1–1.0)
NEUTROS PCT: 69 % (ref 43–77)
Neutro Abs: 5.2 10*3/uL (ref 1.7–7.7)
Platelets: 170 10*3/uL (ref 150–400)
RBC: 3.32 MIL/uL — AB (ref 3.87–5.11)
RDW: 17.1 % — ABNORMAL HIGH (ref 11.5–15.5)
WBC: 7.6 10*3/uL (ref 4.0–10.5)

## 2015-01-25 LAB — GLUCOSE, CAPILLARY
GLUCOSE-CAPILLARY: 232 mg/dL — AB (ref 65–99)
GLUCOSE-CAPILLARY: 260 mg/dL — AB (ref 65–99)
Glucose-Capillary: 209 mg/dL — ABNORMAL HIGH (ref 65–99)
Glucose-Capillary: 309 mg/dL — ABNORMAL HIGH (ref 65–99)

## 2015-01-25 LAB — COMPREHENSIVE METABOLIC PANEL
ALK PHOS: 88 U/L (ref 38–126)
ALT: 13 U/L — AB (ref 14–54)
AST: 27 U/L (ref 15–41)
Albumin: 2.7 g/dL — ABNORMAL LOW (ref 3.5–5.0)
Anion gap: 9 (ref 5–15)
BILIRUBIN TOTAL: 0.4 mg/dL (ref 0.3–1.2)
BUN: 15 mg/dL (ref 6–20)
CALCIUM: 8.3 mg/dL — AB (ref 8.9–10.3)
CO2: 20 mmol/L — ABNORMAL LOW (ref 22–32)
CREATININE: 1.44 mg/dL — AB (ref 0.44–1.00)
Chloride: 108 mmol/L (ref 101–111)
GFR, EST AFRICAN AMERICAN: 42 mL/min — AB (ref >60)
GFR, EST NON AFRICAN AMERICAN: 36 mL/min — AB (ref >60)
Glucose, Bld: 234 mg/dL — ABNORMAL HIGH (ref 65–99)
Potassium: 3.6 mmol/L (ref 3.5–5.1)
Sodium: 137 mmol/L (ref 135–145)
TOTAL PROTEIN: 5.7 g/dL — AB (ref 6.5–8.1)

## 2015-01-25 LAB — TROPONIN I
TROPONIN I: 0.04 ng/mL — AB (ref <0.031)
Troponin I: 0.04 ng/mL — ABNORMAL HIGH (ref <0.031)
Troponin I: 0.04 ng/mL — ABNORMAL HIGH (ref <0.031)

## 2015-01-25 LAB — PHENYTOIN LEVEL, TOTAL: PHENYTOIN LVL: 18.8 ug/mL (ref 10.0–20.0)

## 2015-01-25 LAB — MAGNESIUM: MAGNESIUM: 1.9 mg/dL (ref 1.7–2.4)

## 2015-01-25 MED ORDER — INSULIN ASPART 100 UNIT/ML ~~LOC~~ SOLN
0.0000 [IU] | SUBCUTANEOUS | Status: DC
  Administered 2015-01-25: 7 [IU] via SUBCUTANEOUS
  Administered 2015-01-25: 15 [IU] via SUBCUTANEOUS

## 2015-01-25 MED ORDER — INSULIN ASPART 100 UNIT/ML ~~LOC~~ SOLN
0.0000 [IU] | Freq: Three times a day (TID) | SUBCUTANEOUS | Status: DC
  Administered 2015-01-26 (×2): 11 [IU] via SUBCUTANEOUS
  Administered 2015-01-26: 7 [IU] via SUBCUTANEOUS
  Administered 2015-01-27: 4 [IU] via SUBCUTANEOUS
  Administered 2015-01-27: 11 [IU] via SUBCUTANEOUS
  Administered 2015-01-27 – 2015-01-28 (×3): 4 [IU] via SUBCUTANEOUS

## 2015-01-25 MED ORDER — METOPROLOL TARTRATE 1 MG/ML IV SOLN: 10.0000 mg | Freq: Four times a day (QID) | INTRAVENOUS | Status: DC

## 2015-01-25 MED ORDER — METOPROLOL TARTRATE 1 MG/ML IV SOLN
5.0000 mg | Freq: Three times a day (TID) | INTRAVENOUS | Status: DC
  Administered 2015-01-25: 5 mg via INTRAVENOUS
  Filled 2015-01-25: qty 5

## 2015-01-25 MED ORDER — PRAVASTATIN SODIUM 40 MG PO TABS
80.0000 mg | ORAL_TABLET | Freq: Every day | ORAL | Status: DC
  Administered 2015-01-25 – 2015-01-27 (×3): 80 mg via ORAL
  Filled 2015-01-25 (×3): qty 1

## 2015-01-25 MED ORDER — PERFLUTREN LIPID MICROSPHERE
2.0000 mL | INTRAVENOUS | Status: AC | PRN
  Administered 2015-01-25: 2 mL via INTRAVENOUS
  Filled 2015-01-25: qty 10

## 2015-01-25 MED ORDER — METOPROLOL TARTRATE 1 MG/ML IV SOLN
10.0000 mg | Freq: Four times a day (QID) | INTRAVENOUS | Status: DC
  Administered 2015-01-25 – 2015-01-26 (×3): 10 mg via INTRAVENOUS
  Filled 2015-01-25 (×7): qty 10

## 2015-01-25 MED ORDER — METOPROLOL TARTRATE 1 MG/ML IV SOLN: 7.5000 mg | Freq: Four times a day (QID) | INTRAVENOUS | Status: DC

## 2015-01-25 MED ORDER — INSULIN DETEMIR 100 UNIT/ML ~~LOC~~ SOLN
20.0000 [IU] | Freq: Every day | SUBCUTANEOUS | Status: DC
  Administered 2015-01-25: 20 [IU] via SUBCUTANEOUS
  Filled 2015-01-25 (×2): qty 0.2

## 2015-01-25 NOTE — Progress Notes (Signed)
Subjective: Resting comfortably. More oriented this morning but not back to baseline.   LP shows 10 RBC, 3 WBC, 50 protein. Corrected dilantin level 29.4. MRI brain imaging reviewed, no acute process.   Objective: Current vital signs: BP 161/48 mmHg  Pulse 90  Temp(Src) 98.3 F (36.8 C) (Oral)  Resp 15  Ht 5\' 2"  (1.575 m)  Wt 72 kg (158 lb 11.7 oz)  BMI 29.02 kg/m2  SpO2 98% Vital signs in last 24 hours: Temp:  [98.2 F (36.8 C)-99 F (37.2 C)] 98.3 F (36.8 C) (09/11 0400) Pulse Rate:  [80-99] 90 (09/11 0644) Resp:  [12-20] 15 (09/11 0644) BP: (115-161)/(39-101) 161/48 mmHg (09/11 0644) SpO2:  [96 %-99 %] 98 % (09/11 0644) Weight:  [72 kg (158 lb 11.7 oz)] 72 kg (158 lb 11.7 oz) (09/11 0400)  Intake/Output from previous day: 09/10 0701 - 09/11 0700 In: 4035 [P.O.:360; I.V.:3675] Out: 925 [Urine:925] Intake/Output this shift:   Nutritional status: Diet Carb Modified Fluid consistency:: Thin; Room service appropriate?: Yes  Neurologic Exam: Resting comfortably, oriented to name, "hospital", unsure on date. Following simple commands. No signs of aphasia.  Cranial Nerves: II: Blinks to bilateral confrontation, pupils equal, round, reactive to light  III,IV, VI: ptosis not present, extra-ocular motions intact bilaterally V,VII: smile symmetric, facial light touch sensation normal bilaterally Motor: Moves all extremities strongly against gravity. No focal weakness noted.   Lab Results: Basic Metabolic Panel:  Recent Labs Lab 01/22/15 2125 01/23/15 0306 01/23/15 0950 01/23/15 1245 01/24/15 0219 01/25/15 0240  NA  --  140  --   --  141 137  K  --  3.1*  --  3.8 3.5 3.6  CL  --  105  --   --  110 108  CO2  --  23  --   --  23 20*  GLUCOSE  --  179*  --   --  158* 234*  BUN  --  9  --   --  10 15  CREATININE 1.25* 1.25*  --   --  1.26* 1.44*  CALCIUM  --  9.1  --   --  8.5* 8.3*  MG  --  1.6* 2.8* 2.8* 2.3 1.9  PHOS  --  1.9* 1.7*  --   --   --     Liver  Function Tests:  Recent Labs Lab 01/22/15 2125 01/23/15 0306 01/24/15 0219 01/25/15 0240  AST 43* 49* 30 27  ALT 17 16 15  13*  ALKPHOS 121 108 96 88  BILITOT 1.4* 0.9 1.1 0.4  PROT 7.0 7.2 6.3* 5.7*  ALBUMIN 3.5 3.4* 3.0* 2.7*   No results for input(s): LIPASE, AMYLASE in the last 168 hours.  Recent Labs Lab 01/23/15 0950  AMMONIA 61*    CBC:  Recent Labs Lab 01/22/15 2125 01/23/15 0306 01/24/15 0219 01/25/15 0240  WBC 13.7* 19.1* 9.9 7.6  NEUTROABS  --   --  7.5 5.2  HGB 10.1* 10.4* 9.8* 8.8*  HCT 31.7* 32.2* 30.5* 28.0*  MCV 81.3 81.3 82.4 84.3  PLT 157 227 205 170    Cardiac Enzymes:  Recent Labs Lab 01/22/15 2125 01/23/15 0304 01/23/15 0950  TROPONINI 0.35* 0.29* 0.21*    Lipid Panel:  Recent Labs Lab 01/23/15 2245  CHOL 197  TRIG 212*  HDL 24*  CHOLHDL 8.2  VLDL 42*  LDLCALC 131*    CBG:  Recent Labs Lab 01/24/15 0420 01/24/15 0734 01/24/15 1214 01/24/15 1615 01/24/15 2143  GLUCAP 109* 126* 182*  40* 43*    Microbiology: Results for orders placed or performed during the hospital encounter of 01/22/15  MRSA PCR Screening     Status: None   Collection Time: 01/22/15  6:45 PM  Result Value Ref Range Status   MRSA by PCR NEGATIVE NEGATIVE Final    Comment:        The GeneXpert MRSA Assay (FDA approved for NASAL specimens only), is one component of a comprehensive MRSA colonization surveillance program. It is not intended to diagnose MRSA infection nor to guide or monitor treatment for MRSA infections.   CSF culture     Status: None (Preliminary result)   Collection Time: 01/23/15  2:10 PM  Result Value Ref Range Status   Specimen Description CSF  Final   Special Requests TUBE 1  Final   Gram Stain CYTOSPIN SMEAR NO WBC SEEN NO ORGANISMS SEEN   Final   Culture NO GROWTH 1 DAY  Final   Report Status PENDING  Incomplete    Coagulation Studies:  Recent Labs  01/22/15 2125  LABPROT 15.7*  INR 1.24     Imaging: Mr Jeri Cos [id number] Contrast  01/24/2015   CLINICAL DATA:  Altered mental status. New onset seizure. Mental status improving. Recent lumbar puncture.  EXAM: MRI HEAD WITHOUT AND WITH CONTRAST  TECHNIQUE: Multiplanar, multiecho pulse sequences of the brain and surrounding structures were obtained without and with intravenous contrast.  CONTRAST:  65mL MULTIHANCE GADOBENATE DIMEGLUMINE 529 MG/ML IV SOLN  COMPARISON:  CT head without contrast 01/22/2015.  FINDINGS: The diffusion-weighted images demonstrate no evidence for acute or subacute infarct. Remote infarcts are present in the left basal ganglia and centrum semi ovale.  No acute infarct or hemorrhage is present. The ventricles are of normal size. No significant extra-axial fluid collection is present.  Flow is present in the major intracranial arteries. The globes and orbits are intact. Mild mucosal thickening is present in the ethmoid air cells. The remaining paranasal sinuses and the mastoid air cells are clear.  The postcontrast images demonstrate no pathologic enhancement. The skullbase is within normal limits. Midline structures are unremarkable.  IMPRESSION: 1. No acute intracranial abnormality. 2. 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 3. 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 4. The study is moderately degraded by patient motion.   Electronically Signed   By: San Morelle M.D.   On: 01/24/2015 14:38   Dg Fluoro Guide Ndl Plcd/bx/inj/loc  01/23/2015   CLINICAL DATA:  Altered mental status.  Failed attempt on the floor.  EXAM: DIAGNOSTIC LUMBAR PUNCTURE UNDER FLUOROSCOPIC GUIDANCE  FLUOROSCOPY TIME:  Radiation Exposure Index (as provided by the fluoroscopic device): 7.2 mGy  If the device does not provide the exposure index:  Fluoroscopy Time (in minutes and seconds):  0.8 minutes  Number of Acquired Images:  None, only saved  fluoroscopy  PROCEDURE: Informed consent was obtained from the patient's next of kin by the clinical service. The patient prone, the lower back was prepped with Betadine. The patient was extremely agitated, requiring multiple staff to contain her on the table. 1% Lidocaine was used for local anesthesia. Lumbar puncture was performed at the L3-4 level using a 20 gauge needle with return of clear CSF. Only 4 cc of CSF could be obtained before procedure had to be terminated due to patient extreme motion. There are no apparent complications.  IMPRESSION: Successful lumbar puncture. Due to marked agitation, only 4  cc could be obtained.   Electronically Signed   By: Monte Fantasia M.D.   On: 01/23/2015 15:04    Medications:  Scheduled: . antiseptic oral rinse  7 mL Mouth Rinse q12n4p  . chlorhexidine  15 mL Mouth Rinse BID  . cloNIDine  0.3 mg Oral TID  . docusate sodium  100 mg Oral BID  . folic acid  1 mg Oral Daily  . heparin  5,000 Units Subcutaneous 3 times per day  . hydrALAZINE  10 mg Intravenous Q6H  . insulin aspart  0-15 Units Subcutaneous TID WC  . insulin aspart  0-5 Units Subcutaneous QHS  . insulin detemir  10 Units Subcutaneous QHS  . midazolam  2 mg Intravenous Once  . multivitamin with minerals  1 tablet Oral Daily  . nitroGLYCERIN  0.5 inch Topical 4 times per day  . pantoprazole  40 mg Oral Daily  . pravastatin  40 mg Oral q1800  . sertraline  100 mg Oral Daily  . sodium chloride  3 mL Intravenous Q12H  . thiamine  100 mg Oral Daily    Assessment/Plan:  69 year old female with altered mental status. Was noted by family to have a seizure on day of admission. Started on dilantin by outside hospital. Does not have a history of seizures.Mental status appears to be slowly improving. Suspect continued AMS likely related to elevated dilantin level (correct 29.4).   -discontinue dilantin. Continue to follow level -would hold on starting another AED as no further seizure  activity noted -will continue to follow    LOS: 3 days   Jim Like, DO [facility] [phone]  If 7pm- 7am, please page neurology on call as listed in [place]. 01/25/2015  8:12 AM

## 2015-01-25 NOTE — Progress Notes (Signed)
  Echocardiogram 2D Echocardiogram has been performed.  Stacy Smith 01/25/2015, 11:57 AM

## 2015-01-25 NOTE — Progress Notes (Signed)
[place] TEAM 1 - Stepdown/ICU TEAM Progress Note  Stacy Smith YKZ:[id number] DOB: 1945/11/22 DOA: 01/22/2015 PCP: No primary care provider on file.  Admit HPI / Brief Narrative: Stacy Smith is a 69 y.o. WF PMHx  DM Type II , CKD, HTN, Lupus RA,  Presents as a transfer from [facility] for new onset seizures and altered mental status. Patient presented [facility] from home. Patients husband states that around 12 PM today she was found on the floor. Patients family called EMS. He states that he asked her to squeeze his finger and she was able to do that as he was concerned for stroke. Patients daughter states that she saw the patient having a seizure which lasted about 30 seconds after which she started to snore and grunt and went limp. She did not vomit and she lost control of her bowel and bladder in the ambulance. Her husband states that she has been sick 3 times with a slow heart beat and also CHF. Patient was admitted at that hospital at that time. She has had a problem with hyperkalemia in the past. She also has CKD III and is a diabetic. She has had a CT scan done in the past which acording to the daughter showed a blood clot which had "sealed off" The daughter states that she also believes that she sees her dead parents and she is also seeing other things in the rooms that are not there.   HPI/Subjective: 9/11 A/O 3 (does not recall why), states that she's had previous seizures in September 2015 treated at [facility]. States she also had a clot in her brain (CVA) 2 months ago also treated at [facility]. Arrived at patient's bedside this morning patient complaining of substernal CP, negative radiation, positive SOB, negative N/V. Describes this pain as similar to the feeling she had just prior to her seizure which got her admitted to [facility].     Assessment/Plan: New onset seizures/Altered Mental Status (delirium) -possibly related to not taking  her blood pressure meds and elevated BP noted  -Seizure-free, cognition almost back to baseline. -EEG; consistent with encephalopathy  -frequent neuro checks  -Brain MRI shows old infarcts see results below  -LP; CSF--> elevated glucose, elevated protein, negative WBC, culture pending -Neurology stopped Dilantin, appears seizure activity/AMS may be secondary to uncontrolled BP/episodic ventricular tachycardia. See HTN  Diabetes Mellitus type 2 uncontrolled -Hold Metformin  -Increase Levemir to 20 units daily (home dose Levemir 70). -Increase to resistant SSI -9/8 Hemoglobin A1c= 9.6   Chest pain/sinus tachycardia/demand ischemia -Obtain troponin 3; slightly positive 2 -Obtain echocardiogram -EKG when compared to EKG from 9/8 no significant change  Essential HTN -Hydralazine IV 5 mg QID -Increase clonidine 0.3 mg TID -Increase hydralazine IV 10 mg QID -Start Metoprolol IV 36m QID -If above medications control patient's BP/HR  convert to PO in the a.m.  Hyperlipidemia -Lipid panel not within ADA guidelines. -Increase Pravachol 80 mg daily  Rheumatoid Arthritis/Lupus -will monitor; lupus can have a neurologic component to it i.e. seizures will defer to neurology. -ESR= 60. With such a high ESR concern would be PMR? Early temporal arteritis. Likely patient does not have any of the other signs or symptoms to indicate above however would monitor most likely secondary to her seizure..  CKD III (unknown baseline) -Trending up most likely secondary to her hyperglycemia, continue to monitor   Hypokalemia -WNL  Hypomagnesemia -WNL    Code Status: FULL Family Communication: no family present at  time of exam Disposition Plan: Per neurology    Consultants: Dr.Peter Lincoln Brigham (neuro hospitalist  Procedure/Significant Events: 9/10 EEG; abnormal mild to moderate cerebral disturbance (encephalopathy).  9/10 MRI brain with/without contrast;. No acute intracranial  abnormality. 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 - Age advanced white matter disease. Likely reflects sequela of chronic microvascular ischemia. 9/11 echocardiogram;- Left ventricle: mildconcentric hypertrophy.-LVEF= 60% to 65%.  - (grade 1 diastolic dysfunction).   Culture 9/9 CSF NGTD 9/9 CSF Lyme pending 9/9 HSV negative  Antibiotics: NA  DVT prophylaxis: Heparin subcutaneous   Devices    LINES / TUBES:      Continuous Infusions: . sodium chloride 75 mL/hr (01/25/15 1700)    Objective: VITAL SIGNS: Temp: 99 F (37.2 C) (09/11 1517) Temp Source: Oral (09/11 1517) BP: 173/68 mmHg (09/11 1800) Pulse Rate: 111 (09/11 1800) SPO2; FIO2:   Intake/Output Summary (Last 24 hours) at 01/25/15 1916 Last data filed at 01/25/15 1706  Gross per 24 hour  Intake   1770 ml  Output    900 ml  Net    870 ml     Exam: General: A/O 3 (does not recall why), follow all commands, positive acute respiratory distress Eyes: Pupils equal round reactive to light and accommodation negative scleral hemorrhage ENT: Negative Runny nose, negative gingival bleeding, Neck:  Negative scars, masses, torticollis, lymphadenopathy, JVD Lungs: Clear to auscultation bilaterally without wheezes or crackles Cardiovascular: Tachycardic, Regular rhythm without murmur gallop or rub normal S1 and S2 Abdomen:negative abdominal pain, negative dysphagia, nondistended, positive soft, bowel sounds, no rebound, no ascites, no appreciable mass Extremities: No significant cyanosis, clubbing, or edema bilateral lower extremities Psychiatric:  Negative depression, negative anxiety, negative fatigue, negative mania  Neurologic:  Cranial nerves II through XII intact, tongue/uvula midline, all extremities muscle strength 5/5, sensation intact throughout, negative dysarthria, negative expressive aphasia, negative receptive aphasia.   Data Reviewed: Basic Metabolic Panel:  Recent  Labs Lab 01/22/15 2125 01/23/15 0306 01/23/15 0950 01/23/15 1245 01/24/15 0219 01/25/15 0240  NA  --  140  --   --  141 137  K  --  3.1*  --  3.8 3.5 3.6  CL  --  105  --   --  110 108  CO2  --  23  --   --  23 20*  GLUCOSE  --  179*  --   --  158* 234*  BUN  --  9  --   --  10 15  CREATININE 1.25* 1.25*  --   --  1.26* 1.44*  CALCIUM  --  9.1  --   --  8.5* 8.3*  MG  --  1.6* 2.8* 2.8* 2.3 1.9  PHOS  --  1.9* 1.7*  --   --   --    Liver Function Tests:  Recent Labs Lab 01/22/15 2125 01/23/15 0306 01/24/15 0219 01/25/15 0240  AST 43* 49* 30 27  ALT _0 13*  ALKPHOS 121 108 96 88  BILITOT 1.4* 0.9 1.1 0.4  PROT 7.0 7.2 6.3* 5.7*  ALBUMIN 3.5 3.4* 3.0* 2.7*   No results for input(s): LIPASE, AMYLASE in the last 168 hours.  Recent Labs Lab 01/23/15 0950  AMMONIA 61*   CBC:  Recent Labs Lab 01/22/15 2125 01/23/15 0306 01/24/15 0219 01/25/15 0240  WBC 13.7* 19.1* 9.9 7.6  NEUTROABS  --   --  7.5 5.2  HGB 10.1* 10.4* 9.8* 8.8*  HCT 31.7* 32.2* 30.5* 28.0*  MCV 81.3 81.3 82.4 84.3  PLT 157 227 205 170   Cardiac Enzymes:  Recent Labs Lab 01/22/15 2125 01/23/15 0304 01/23/15 0950 01/25/15 1000 01/25/15 1545  TROPONINI 0.35* 0.29* 0.21* 0.04* 0.04*   BNP (last 3 results) No results for input(s): BNP in the last 8760 hours.  ProBNP (last 3 results) No results for input(s): PROBNP in the last 8760 hours.  CBG:  Recent Labs Lab 01/24/15 1615 01/24/15 2143 01/25/15 0818 01/25/15 1203 01/25/15 1632  GLUCAP 237* 335* 209* 309* 232*    Recent Results (from the past 240 hour(s))  MRSA PCR Screening     Status: None   Collection Time: 01/22/15  6:45 PM  Result Value Ref Range Status   MRSA by PCR NEGATIVE NEGATIVE Final    Comment:        The GeneXpert MRSA Assay (FDA approved for NASAL specimens only), is one component of a comprehensive MRSA colonization surveillance program. It is not intended to diagnose MRSA infection nor to  guide or monitor treatment for MRSA infections.   CSF culture     Status: None (Preliminary result)   Collection Time: 01/23/15  2:10 PM  Result Value Ref Range Status   Specimen Description CSF  Final   Special Requests TUBE 1  Final   Gram Stain CYTOSPIN SMEAR NO WBC SEEN NO ORGANISMS SEEN   Final   Culture NO GROWTH 2 DAYS  Final   Report Status PENDING  Incomplete     Studies:  Recent x-ray studies have been reviewed in detail by the Attending Physician  Scheduled Meds:  Scheduled Meds: . antiseptic oral rinse  7 mL Mouth Rinse q12n4p  . chlorhexidine  15 mL Mouth Rinse BID  . cloNIDine  0.3 mg Oral TID  . docusate sodium  100 mg Oral BID  . folic acid  1 mg Oral Daily  . heparin  5,000 Units Subcutaneous 3 times per day  . hydrALAZINE  10 mg Intravenous Q6H  . insulin aspart  0-20 Units Subcutaneous 6 times per day  . insulin aspart  0-5 Units Subcutaneous QHS  . insulin detemir  20 Units Subcutaneous QHS  . metoprolol  10 mg Intravenous 4 times per day  . midazolam  2 mg Intravenous Once  . multivitamin with minerals  1 tablet Oral Daily  . nitroGLYCERIN  0.5 inch Topical 4 times per day  . pantoprazole  40 mg Oral Daily  . pravastatin  80 mg Oral q1800  . sertraline  100 mg Oral Daily  . sodium chloride  3 mL Intravenous Q12H  . thiamine  100 mg Oral Daily    Time spent on care of this patient: 40 mins   WOODS, Geraldo Docker , MD  [facility] Office  [phone] Pager - [phone]  On-Call/Text Page:      [email or web address]      password [email or web address]  If 7PM-7AM, please contact night-coverage [email or web address] Password [email or web address] 01/25/2015, 7:16 PM   LOS: 3 days   Care during the described time interval was provided by me .  I have reviewed this patient's available data, including medical history, events of note, physical examination, and all test results as part of my evaluation. I have personally reviewed and interpreted all radiology studies.   Dia Crawford,  MD [phone] Pager

## 2015-01-25 NOTE — Progress Notes (Signed)
MEDICATION RELATED CONSULT NOTE - Follow-Up  Pharmacy Consult for phenytoin IV Indication: new-onset seizures  Allergies  Allergen Reactions  . Latex     Patient Measurements: Height: 5\' 2"  (157.5 cm) Weight: 158 lb 11.7 oz (72 kg) IBW/kg (Calculated) : 50.1 Adjusted BW: 58.1kg  Vital Signs: Temp: 98.3 F (36.8 C) (09/11 0400) Temp Source: Oral (09/11 0400) BP: 161/48 mmHg (09/11 0644) Pulse Rate: 90 (09/11 0644) Intake/Output from previous day: 09/10 0701 - 09/11 0700 In: 4035 [P.O.:360; I.V.:3675] Out: 925 [Urine:925] Intake/Output from this shift:    Labs:  Recent Labs  01/22/15 2125  01/23/15 0306 01/23/15 0950 01/23/15 1245 01/24/15 0219 01/25/15 0240  WBC 13.7*  --  19.1*  --   --  9.9 7.6  HGB 10.1*  --  10.4*  --   --  9.8* 8.8*  HCT 31.7*  --  32.2*  --   --  30.5* 28.0*  PLT 157  --  227  --   --  205 170  CREATININE 1.25*  --  1.25*  --   --  1.26* 1.44*  MG  --   < > 1.6* 2.8* 2.8* 2.3 1.9  PHOS  --   --  1.9* 1.7*  --   --   --   ALBUMIN 3.5  --  3.4*  --   --  3.0* 2.7*  PROT 7.0  --  7.2  --   --  6.3* 5.7*  AST 43*  --  49*  --   --  30 27  ALT 17  --  16  --   --  15 13*  ALKPHOS 121  --  108  --   --  96 88  BILITOT 1.4*  --  0.9  --   --  1.1 0.4  BILIDIR 0.3  --   --   --   --   --   --   IBILI 1.1*  --   --   --   --   --   --   < > = values in this interval not displayed. Estimated Creatinine Clearance: 34.3 mL/min (by C-G formula based on Cr of 1.44).   Microbiology: Recent Results (from the past 720 hour(s))  MRSA PCR Screening     Status: None   Collection Time: 01/22/15  6:45 PM  Result Value Ref Range Status   MRSA by PCR NEGATIVE NEGATIVE Final    Comment:        The GeneXpert MRSA Assay (FDA approved for NASAL specimens only), is one component of a comprehensive MRSA colonization surveillance program. It is not intended to diagnose MRSA infection nor to guide or monitor treatment for MRSA infections.   CSF culture      Status: None (Preliminary result)   Collection Time: 01/23/15  2:10 PM  Result Value Ref Range Status   Specimen Description CSF  Final   Special Requests TUBE 1  Final   Gram Stain CYTOSPIN SMEAR NO WBC SEEN NO ORGANISMS SEEN   Final   Culture NO GROWTH 1 DAY  Final   Report Status PENDING  Incomplete    Medical History: No past medical history on file.  Assessment: 69 y.o. female with prior history of DM Type II, CKD, HTN, Lupus, and RA presents as a transfer from [facility] regional for new onset seizures and altered mental status. Pharmacy consulted to dose phenytoin IV and to assess for drug interactions. Adjusted BW used due to TBW>  120% of IBW. SCr 1.44, CrCl 34, LDL 131, TG 212, Albumin 2.7, AST/ALT wnl  9/10 Phenytoin level = 21.7, corrects to 31 d/t low albumin; was on 100 mg IV q8h 9/11 Phenytoin level = 18.8, corrects to 29.4 d/t low albumin  Renal function and albumin worse today  Plan:  -Continue to hold today's doses -Recheck tomorrow morning and start at 200 mg daily if < 20 -Obtain a level 5-7 days after therapy initiation or adjustments to assess trend in concentration  Governor Specking, PharmD Clinical Pharmacy Resident Pager: [phone]  01/25/2015 7:55 AM

## 2015-01-25 NOTE — Progress Notes (Signed)
UR COMPLETED  

## 2015-01-26 ENCOUNTER — Encounter (HOSPITAL_COMMUNITY): Payer: Self-pay | Admitting: *Deleted

## 2015-01-26 DIAGNOSIS — R569 Unspecified convulsions: Principal | ICD-10-CM

## 2015-01-26 DIAGNOSIS — I10 Essential (primary) hypertension: Secondary | ICD-10-CM

## 2015-01-26 DIAGNOSIS — R4182 Altered mental status, unspecified: Secondary | ICD-10-CM

## 2015-01-26 LAB — CBC WITH DIFFERENTIAL/PLATELET
BASOS ABS: 0 10*3/uL (ref 0.0–0.1)
Basophils Relative: 0 % (ref 0–1)
EOS ABS: 0.2 10*3/uL (ref 0.0–0.7)
Eosinophils Relative: 2 % (ref 0–5)
HCT: 30.1 % — ABNORMAL LOW (ref 36.0–46.0)
HEMOGLOBIN: 9.4 g/dL — AB (ref 12.0–15.0)
LYMPHS ABS: 1.6 10*3/uL (ref 0.7–4.0)
Lymphocytes Relative: 22 % (ref 12–46)
MCH: 26 pg (ref 26.0–34.0)
MCHC: 31.2 g/dL (ref 30.0–36.0)
MCV: 83.4 fL (ref 78.0–100.0)
Monocytes Absolute: 0.5 10*3/uL (ref 0.1–1.0)
Monocytes Relative: 6 % (ref 3–12)
NEUTROS PCT: 70 % (ref 43–77)
Neutro Abs: 5.1 10*3/uL (ref 1.7–7.7)
Platelets: 185 10*3/uL (ref 150–400)
RBC: 3.61 MIL/uL — AB (ref 3.87–5.11)
RDW: 17 % — ABNORMAL HIGH (ref 11.5–15.5)
WBC: 7.3 10*3/uL (ref 4.0–10.5)

## 2015-01-26 LAB — CSF CULTURE W GRAM STAIN: Culture: NO GROWTH

## 2015-01-26 LAB — COMPREHENSIVE METABOLIC PANEL
ALBUMIN: 2.7 g/dL — AB (ref 3.5–5.0)
ALK PHOS: 96 U/L (ref 38–126)
ALT: 15 U/L (ref 14–54)
AST: 25 U/L (ref 15–41)
Anion gap: 7 (ref 5–15)
BUN: 8 mg/dL (ref 6–20)
CALCIUM: 8.5 mg/dL — AB (ref 8.9–10.3)
CHLORIDE: 111 mmol/L (ref 101–111)
CO2: 21 mmol/L — AB (ref 22–32)
CREATININE: 1.08 mg/dL — AB (ref 0.44–1.00)
GFR calc non Af Amer: 51 mL/min — ABNORMAL LOW (ref >60)
GFR, EST AFRICAN AMERICAN: 59 mL/min — AB (ref >60)
GLUCOSE: 220 mg/dL — AB (ref 65–99)
Potassium: 3.7 mmol/L (ref 3.5–5.1)
SODIUM: 139 mmol/L (ref 135–145)
Total Bilirubin: 0.6 mg/dL (ref 0.3–1.2)
Total Protein: 5.7 g/dL — ABNORMAL LOW (ref 6.5–8.1)

## 2015-01-26 LAB — GLUCOSE, CAPILLARY
GLUCOSE-CAPILLARY: 241 mg/dL — AB (ref 65–99)
Glucose-Capillary: 278 mg/dL — ABNORMAL HIGH (ref 65–99)
Glucose-Capillary: 293 mg/dL — ABNORMAL HIGH (ref 65–99)
Glucose-Capillary: 210 mg/dL — ABNORMAL HIGH (ref 65–99)

## 2015-01-26 LAB — MAGNESIUM: Magnesium: 1.6 mg/dL — ABNORMAL LOW (ref 1.7–2.4)

## 2015-01-26 LAB — CSF CULTURE

## 2015-01-26 LAB — PHENYTOIN LEVEL, TOTAL: Phenytoin Lvl: 12.8 ug/mL (ref 10.0–20.0)

## 2015-01-26 LAB — SEDIMENTATION RATE: Sed Rate: 45 mm/hr — ABNORMAL HIGH (ref 0–22)

## 2015-01-26 MED ORDER — INSULIN NPH (HUMAN) (ISOPHANE) 100 UNIT/ML ~~LOC~~ SUSP
35.0000 [IU] | Freq: Two times a day (BID) | SUBCUTANEOUS | Status: DC
  Administered 2015-01-26 – 2015-01-28 (×4): 35 [IU] via SUBCUTANEOUS
  Filled 2015-01-26 (×4): qty 10

## 2015-01-26 MED ORDER — METOPROLOL TARTRATE 1 MG/ML IV SOLN
10.0000 mg | Freq: Four times a day (QID) | INTRAVENOUS | Status: DC | PRN
  Administered 2015-01-26: 10 mg via INTRAVENOUS
  Filled 2015-01-26: qty 10

## 2015-01-26 MED ORDER — MAGNESIUM SULFATE 2 GM/50ML IV SOLN
2.0000 g | Freq: Once | INTRAVENOUS | Status: AC
  Administered 2015-01-26: 2 g via INTRAVENOUS
  Filled 2015-01-26: qty 50

## 2015-01-26 MED ORDER — K PHOS MONO-SOD PHOS DI & MONO 155-852-130 MG PO TABS
500.0000 mg | ORAL_TABLET | Freq: Two times a day (BID) | ORAL | Status: AC
  Administered 2015-01-26 – 2015-01-27 (×2): 500 mg via ORAL
  Filled 2015-01-26 (×2): qty 2

## 2015-01-26 MED ORDER — HYDRALAZINE HCL 10 MG PO TABS
10.0000 mg | ORAL_TABLET | Freq: Four times a day (QID) | ORAL | Status: DC
  Administered 2015-01-26 – 2015-01-27 (×5): 10 mg via ORAL
  Filled 2015-01-26 (×7): qty 1

## 2015-01-26 MED ORDER — DIPHENHYDRAMINE HCL 25 MG PO CAPS
25.0000 mg | ORAL_CAPSULE | Freq: Four times a day (QID) | ORAL | Status: DC | PRN
  Administered 2015-01-26 – 2015-01-27 (×2): 25 mg via ORAL
  Filled 2015-01-26 (×2): qty 1

## 2015-01-26 MED ORDER — SODIUM CHLORIDE 0.9 % IV SOLN: INTRAVENOUS | Status: DC

## 2015-01-26 MED ORDER — INSULIN NPH (HUMAN) (ISOPHANE) 100 UNIT/ML ~~LOC~~ SUSP: 40.0000 [IU] | Freq: Two times a day (BID) | SUBCUTANEOUS | Status: DC

## 2015-01-26 MED ORDER — CARVEDILOL 6.25 MG PO TABS
6.2500 mg | ORAL_TABLET | Freq: Two times a day (BID) | ORAL | Status: DC
  Administered 2015-01-26 – 2015-01-28 (×5): 6.25 mg via ORAL
  Filled 2015-01-26 (×6): qty 1

## 2015-01-26 NOTE — Progress Notes (Deleted)
Inpatient Diabetes Program Recommendations  AACE/ADA: New Consensus Statement on Inpatient Glycemic Control (2013)  Target Ranges:  Prepandial:   less than 140 mg/dL      Peak postprandial:   less than 180 mg/dL (1-2 hours)      Critically ill patients:  140 - 180 mg/dL   Review of glucose control-maintaining glucose levels in 200's using resistant correction tidwc and HS scales and levemir 20 units PO intake documented at 100% Outpatient Diabetes medications: Levemir 70 units  Inpatient Diabetes Program Recommendations Insulin - Basal: Please consider increase in basal levemir to 30-40 units as cbg's running in 200's using resistant correction requiring 7-11 units each cbg check and maintaining 200's glucose levels  Thank you Rosita Kea, RN, MSN, CDE  Diabetes Inpatient Program Office: [phone] Pager: [phone] 8:00 am to 5:00 pm

## 2015-01-26 NOTE — Progress Notes (Signed)
Subjective: Improvement in mental status noted. Corrected Dilantin level down to 20.0 today. Patient is complaining of diffuse nonspecific pains.  Objective: Current vital signs: BP 211/75 mmHg  Pulse 82  Temp(Src) 99.1 F (37.3 C) (Oral)  Resp 18  Ht 5\' 2"  (1.575 m)  Wt 72.1 kg (158 lb 15.2 oz)  BMI 29.07 kg/m2  SpO2 99%  Neurologic Exam: Patient was alert and in no acute distress. She was well-oriented to time as well as place. She had no receptive no expressive aphasia. Affect was appropriate. Extraocular movements were full and conjugate. There was no nystagmus. No facial weakness noted. Strength of extremities was normal proximally and distally. Coordination was normal.  Medications: I have reviewed the patient's current medications.  Assessment/Plan: 69 year old lady admitted with altered mental status, most likely secondary to Dilantin toxicity, with improvement significantly now that Dilantin level is back to normal therapeutic range.  Recommendations: 1. Continued management of Dilantin per Pharmacy recommendations 2. No further neurological intervention is indicated at this point. We will see this patient in follow-up on an as-needed basis after this visit.  C.R. Nicole Kindred, MD [facility] [phone]  01/26/2015  10:16 AM

## 2015-01-26 NOTE — Progress Notes (Signed)
Inpatient Diabetes Program Recommendations  AACE/ADA: New Consensus Statement on Inpatient Glycemic Control (2013)  Target Ranges:  Prepandial:   less than 140 mg/dL      Peak postprandial:   less than 180 mg/dL (1-2 hours)      Critically ill patients:  140 - 180 mg/dL   Review of Glycemic Control:  Results for Stacy Smith, Stacy Smith (MRN [id number]) as of 01/26/2015 14:14  Ref. Range 01/25/2015 08:18 01/25/2015 12:03 01/25/2015 16:32 01/25/2015 20:50 01/26/2015 07:32 01/26/2015 11:30  Glucose-Capillary Latest Ref Range: 65-99 mg/dL 209 (H) 309 (H) 232 (H) 260 (H) 241 (H) 278 (H)   Diabetes history: Type 2 Outpatient Diabetes medications: Levimir 70 units q evening, Glucophage 1000 bid Current orders for Inpatient glycemic control: Levemir 20 units at HS, Resistant Novolog correction scale tid, HS Novolog correction at HS   Recommendation:   Increase Levemir to at least half of home dose-- approximately 35 units Add Novolog 3 units tid as meal coverage tid with meals provided patient eats at least 50% and CBG > 80 mg/dl  Plan to visit patient at bedside to discuss elevated A1C.  Thank you.  [name] S. Marcelline Mates, RN, CNS, CDE Inpatient Diabetes Program, team pager [phone] (8am to 5pm)

## 2015-01-26 NOTE — Progress Notes (Signed)
[place] TEAM 1 - Stepdown/ICU TEAM Progress Note  Seanna Daisey [id number] DOB: [birth year] DOA: 01/22/2015 PCP: No primary care provider on file.  Admit HPI / Brief Narrative: 69 y.o. F Hx  DM Type II , CKD, HTN, Lupus, and RA who was transfered from [facility] for new onset seizures and altered mental status. She was found on the floor and family called EMS.  Her daughter saw the patient having a seizure which lasted about 30 seconds after which she started to snore and grunt and went limp.   HPI/Subjective: The patient is resting comfortably in bed.  She is alert and interactive though slightly confused.  She denies chest pain nausea vomiting abdominal pain or shortness of breath.  Assessment/Plan:  New onset seizure / Altered Mental Status -possibly related to not taking her blood pressure meds -Seizure-free, cognition almost back to baseline -EEG consistent with encephalopathy  -Brain MRI noted only old lacunar infarcts / advanced white matter disease  -LP > elevated glucose, elevated protein, negative WBC, culture pending -Neurology suggested stopping Dilantin, appears seizure activity/AMS may be secondary to uncontrolled BP/episodic ventricular tachycardia  Malignant HTN (seizure - encephalopathy) -BP remains poorly controlled - adjust medical tx further - transition to oral meds   Diabetes Mellitus type 2 uncontrolled -home dose Levemir actually 70 BID - pt currently spending nearly $1000/month on insulin alone per her report - attempt to transition to NPH while hospitalized for financial reasons - if able to control w/ NPH will continue at time of d/c  -9/8 A1c 9.6   Chest pain / sinus tachycardia / demand ischemia -Troponin not significantly elevated 3 -TTE without wall motion abnormalities and with preserved ejection fraction -EKG when compared to EKG from 9/8 no significant change  Hyperlipidemia -Lipid panel not within ADA guidelines. -Increased Pravachol  80 mg daily  Hypophosphatemia -replace and recheck in AM  Rheumatoid Arthritis / Lupus -Appears reasonably well controlled at present  CKD III  -Baseline unclear but renal function improving  Hypokalemia -Corrected with replacement  Hypomagnesemia -Continue to supplement  Code Status: FULL Family Communication: no family present at time of exam Disposition Plan: discussed transfer to med bed w/ pt but she adamantly refused - will allow SDU one additional night and re-eval in AM   Consultants: Neurology   Procedure/Significant Events: 9/10 EEG abnormal mild to moderate cerebral disturbance (encephalopathy).  9/10 MRI brain with/without contrast;. No acute intracranial abnormality - Remote lacunar infarcts left basal ganglia/centrum semi ovale - Age advanced white matter disease. Likely reflects sequela of chronic microvascular ischemia. 9/11 TTE - Left ventricle: mildconcentric hypertrophy - LVEF= 60% to 65% - grade 1 diastolic dysfunction  Antibiotics: NA  DVT prophylaxis: Heparin subcutaneous  Objective: VITAL SIGNS: Temp: 97.6 F (36.4 C) (09/12 1500) Temp Source: Oral (09/12 1500) BP: 155/49 mmHg (09/12 1509) Pulse Rate: 80 (09/12 1509)  Intake/Output Summary (Last 24 hours) at 01/26/15 1550 Last data filed at 01/26/15 1509  Gross per 24 hour  Intake 2956.25 ml  Output   3400 ml  Net -443.75 ml   Exam: General: No acute respiratory distress - mildly confused  Lungs: Clear to auscultation bilaterally without wheezes or crackles Cardiovascular: Regular rate and rhythm without murmur gallop or rub normal S1 and S2 Abdomen: Nontender, nondistended, soft, bowel sounds positive, no rebound, no ascites, no appreciable mass Extremities: No significant cyanosis, clubbing, or edema bilateral lower extremities  Data Reviewed: Basic Metabolic Panel:  Recent Labs Lab 01/22/15 2125  01/23/15 0306 01/23/15 0950  01/23/15 1245 01/24/15 0219 01/25/15 0240  01/26/15 0303  NA  --   --  140  --   --  141 137 139  K  --   --  3.1*  --  3.8 3.5 3.6 3.7  CL  --   --  105  --   --  110 108 111  CO2  --   --  23  --   --  23 20* 21*  GLUCOSE  --   --  179*  --   --  158* 234* 220*  BUN  --   --  9  --   --  10 15 8   CREATININE 1.25*  --  1.25*  --   --  1.26* 1.44* 1.08*  CALCIUM  --   --  9.1  --   --  8.5* 8.3* 8.5*  MG  --   < > 1.6* 2.8* 2.8* 2.3 1.9 1.6*  PHOS  --   --  1.9* 1.7*  --   --   --   --   < > = values in this interval not displayed.   Liver Function Tests:  Recent Labs Lab 01/22/15 2125 01/23/15 0306 01/24/15 0219 01/25/15 0240 01/26/15 0303  AST 43* 49* 30 27 25   ALT 17 16 15  13* 15  ALKPHOS 121 108 96 88 96  BILITOT 1.4* 0.9 1.1 0.4 0.6  PROT 7.0 7.2 6.3* 5.7* 5.7*  ALBUMIN 3.5 3.4* 3.0* 2.7* 2.7*    Recent Labs Lab 01/23/15 0950  AMMONIA 61*   CBC:  Recent Labs Lab 01/22/15 2125 01/23/15 0306 01/24/15 0219 01/25/15 0240 01/26/15 0303  WBC 13.7* 19.1* 9.9 7.6 7.3  NEUTROABS  --   --  7.5 5.2 5.1  HGB 10.1* 10.4* 9.8* 8.8* 9.4*  HCT 31.7* 32.2* 30.5* 28.0* 30.1*  MCV 81.3 81.3 82.4 84.3 83.4  PLT 157 227 205 170 185   Cardiac Enzymes:  Recent Labs Lab 01/23/15 0304 01/23/15 0950 01/25/15 1000 01/25/15 1545 01/25/15 2133  TROPONINI 0.29* 0.21* 0.04* 0.04* 0.04*   CBG:  Recent Labs Lab 01/25/15 1203 01/25/15 1632 01/25/15 2050 01/26/15 0732 01/26/15 1130  GLUCAP 309* 232* 260* 241* 278*    Recent Results (from the past 240 hour(s))  MRSA PCR Screening     Status: None   Collection Time: 01/22/15  6:45 PM  Result Value Ref Range Status   MRSA by PCR NEGATIVE NEGATIVE Final    Comment:        The GeneXpert MRSA Assay (FDA approved for NASAL specimens only), is one component of a comprehensive MRSA colonization surveillance program. It is not intended to diagnose MRSA infection nor to guide or monitor treatment for MRSA infections.   CSF culture     Status: None    Collection Time: 01/23/15  2:10 PM  Result Value Ref Range Status   Specimen Description CSF  Final   Special Requests TUBE 1  Final   Gram Stain CYTOSPIN SMEAR NO WBC SEEN NO ORGANISMS SEEN   Final   Culture NO GROWTH 3 DAYS  Final   Report Status 01/26/2015 FINAL  Final     Studies:  Recent x-ray studies have been reviewed in detail by the Attending Physician  Scheduled Meds:  Scheduled Meds: . antiseptic oral rinse  7 mL Mouth Rinse q12n4p  . carvedilol  6.25 mg Oral BID WC  . chlorhexidine  15 mL Mouth Rinse BID  . cloNIDine  0.3 mg Oral  TID  . docusate sodium  100 mg Oral BID  . folic acid  1 mg Oral Daily  . heparin  5,000 Units Subcutaneous 3 times per day  . hydrALAZINE  10 mg Intravenous Q6H  . insulin aspart  0-20 Units Subcutaneous TID WC  . insulin aspart  0-5 Units Subcutaneous QHS  . insulin detemir  20 Units Subcutaneous QHS  . multivitamin with minerals  1 tablet Oral Daily  . nitroGLYCERIN  0.5 inch Topical 4 times per day  . pantoprazole  40 mg Oral Daily  . pravastatin  80 mg Oral q1800  . sertraline  100 mg Oral Daily  . sodium chloride  3 mL Intravenous Q12H  . thiamine  100 mg Oral Daily    Time spent on care of this patient: 35 mins  Cherene Altes, MD [facility] For Consults/Admissions - Flow Manager - [phone] Office  [phone]  Contact MD directly via text page:      [email or web address]      password [facility]  01/26/2015, 3:50 PM   LOS: 4 days

## 2015-01-26 NOTE — Progress Notes (Signed)
Inpatient Diabetes Program Recommendations  AACE/ADA: New Consensus Statement on Inpatient Glycemic Control (2013)  Target Ranges:  Prepandial:   less than 140 mg/dL      Peak postprandial:   less than 180 mg/dL (1-2 hours)      Critically ill patients:  140 - 180 mg/dL   Reason for Visit: A1C of 9.6  Diabetes history: Type 2 Outpatient Diabetes medications: Levimir 70 units q evening, Glucophage 1000 bid Current orders for Inpatient glycemic control: Levemir 20 units at HS, Resistant Novolog correction scale tid, HS Novolog correction at HS   Note: Visit at bedside to discuss diabetes self-care at home.  Patient prepares and gives own insulin using a pen.  States cost is very high-- apparently insurance has already paid maximum for now and she is paying out of pocket.  Patient states that she takes Levemir 70 units morning and night-- not just once daily.  Routinely checks CBG at least once daily, or any time she feels unusual.  States that her sugars go up and down but she doesn't eat "junk".  Says she has been at death's door 5 times since last October.  Currently trying to get teeth extracted-- last resort because caps and prior care is deteriorating.  Wants to get false teeth.  Discussed diabetes education options in [place]-- either free and for a co-payment.  Patient states she just cannot commit to anything right now.  Has to recover.  Left a flyer at bedside for her reference.  Checked and was unable to find any assistance regarding coupons or vouchers for Levemir since she has a medicare supplement.  Recommendations: May need greater increase in Levemir than previous note since taking twice as much as originally listed. Suggested patient check CBG's more often-- ideally four times daily, at home MD may want to temporarily change patient to a less expensive insulin, such as generic NPH BID while having to pay out of pocket    Thank you.  Taiyo Kozma S. Marcelline Mates, RN, CNS, CDE Inpatient  Diabetes Program, team pager [phone] (8am to 5pm)

## 2015-01-26 NOTE — Progress Notes (Signed)
Lopressor 10mg  iv given waiting on coreg

## 2015-01-26 NOTE — Care Management Note (Signed)
Case Management Note  Patient Details  Name: Stacy Smith MRN: [id number] Date of Birth: 07-Jun-1945  Subjective/Objective:             Admitted with new onset seizures and altered mental status from home with husband. Independent with ADL's.   Action/Plan:  Return to home when medically stable. CM to f/u with disposition needs.  Expected Discharge Date:                  Expected Discharge Plan:  Home/Self Care  In-House Referral:     Discharge planning Services  CM Consult  Post Acute Care Choice:    Choice offered to:     DME Arranged:    DME Agency:     HH Arranged:    HH Agency:     Status of Service:  In process, will continue to follow  Medicare Important Message Given:    Date Medicare IM Given:    Medicare IM give by:    Date Additional Medicare IM Given:    Additional Medicare Important Message give by:     If discussed at [place] of Stay Meetings, dates discussed:    Additional Comments: CM consulted for [facility] and medication needs. Pt shared with CM  that she doesn't have any HH needs and husband and daughter will assist with care once d/c. States she has a walker @ home to help with ambulation. Pt voiced concerns with medication cost, states she's in the "donut hole"regarding insurance which is making medication very costly. CM conferred with MD and Diabetes coordinator and shared pt's concern. CM provided pt with information on Medicare [place] Program to possibly help  offset cost. Pt voiced appreciation for information and stated will f/u.   Whitman Hero [place], [place], [place] [phone] 01/26/2015, 3:38 PM

## 2015-01-27 ENCOUNTER — Encounter (HOSPITAL_COMMUNITY): Payer: Self-pay | Admitting: *Deleted

## 2015-01-27 LAB — CBC
HEMATOCRIT: 30.1 % — AB (ref 36.0–46.0)
Hemoglobin: 9.4 g/dL — ABNORMAL LOW (ref 12.0–15.0)
MCH: 25.9 pg — ABNORMAL LOW (ref 26.0–34.0)
MCHC: 31.2 g/dL (ref 30.0–36.0)
MCV: 82.9 fL (ref 78.0–100.0)
Platelets: 170 10*3/uL (ref 150–400)
RBC: 3.63 MIL/uL — AB (ref 3.87–5.11)
RDW: 16.6 % — AB (ref 11.5–15.5)
WBC: 6.6 10*3/uL (ref 4.0–10.5)

## 2015-01-27 LAB — COMPREHENSIVE METABOLIC PANEL
ALT: 14 U/L (ref 14–54)
ANION GAP: 6 (ref 5–15)
AST: 26 U/L (ref 15–41)
Albumin: 2.6 g/dL — ABNORMAL LOW (ref 3.5–5.0)
Alkaline Phosphatase: 96 U/L (ref 38–126)
BUN: 11 mg/dL (ref 6–20)
CHLORIDE: 109 mmol/L (ref 101–111)
CO2: 23 mmol/L (ref 22–32)
CREATININE: 1.1 mg/dL — AB (ref 0.44–1.00)
Calcium: 8.4 mg/dL — ABNORMAL LOW (ref 8.9–10.3)
GFR, EST AFRICAN AMERICAN: 58 mL/min — AB (ref >60)
GFR, EST NON AFRICAN AMERICAN: 50 mL/min — AB (ref >60)
Glucose, Bld: 199 mg/dL — ABNORMAL HIGH (ref 65–99)
POTASSIUM: 3.2 mmol/L — AB (ref 3.5–5.1)
Sodium: 138 mmol/L (ref 135–145)
Total Bilirubin: 0.8 mg/dL (ref 0.3–1.2)
Total Protein: 5.8 g/dL — ABNORMAL LOW (ref 6.5–8.1)

## 2015-01-27 LAB — PHOSPHORUS: PHOSPHORUS: 3.1 mg/dL (ref 2.5–4.6)

## 2015-01-27 LAB — GLUCOSE, CAPILLARY
GLUCOSE-CAPILLARY: 157 mg/dL — AB (ref 65–99)
GLUCOSE-CAPILLARY: 194 mg/dL — AB (ref 65–99)
GLUCOSE-CAPILLARY: 283 mg/dL — AB (ref 65–99)
Glucose-Capillary: 79 mg/dL (ref 65–99)

## 2015-01-27 LAB — MAGNESIUM: MAGNESIUM: 2.1 mg/dL (ref 1.7–2.4)

## 2015-01-27 LAB — HSV(HERPES SMPLX VRS)ABS-I+II(IGG)-CSF

## 2015-01-27 MED ORDER — POTASSIUM CHLORIDE CRYS ER 20 MEQ PO TBCR
40.0000 meq | EXTENDED_RELEASE_TABLET | Freq: Once | ORAL | Status: DC
  Filled 2015-01-27: qty 2

## 2015-01-27 MED ORDER — HYDRALAZINE HCL 10 MG PO TABS
20.0000 mg | ORAL_TABLET | Freq: Four times a day (QID) | ORAL | Status: DC
  Administered 2015-01-27 – 2015-01-28 (×4): 20 mg via ORAL
  Filled 2015-01-27 (×2): qty 2

## 2015-01-27 MED ORDER — LORAZEPAM 2 MG/ML IJ SOLN
1.0000 mg | Freq: Three times a day (TID) | INTRAMUSCULAR | Status: DC | PRN
  Administered 2015-01-27: 1 mg via INTRAVENOUS
  Filled 2015-01-27: qty 1

## 2015-01-27 MED ORDER — CLONIDINE HCL 0.2 MG PO TABS
0.4000 mg | ORAL_TABLET | Freq: Three times a day (TID) | ORAL | Status: DC
  Administered 2015-01-27 – 2015-01-28 (×3): 0.4 mg via ORAL
  Filled 2015-01-27 (×4): qty 2

## 2015-01-27 MED ORDER — INSULIN STARTER KIT- SYRINGES (ENGLISH)
1.0000 | Freq: Once | Status: AC
  Administered 2015-01-27: 1
  Filled 2015-01-27: qty 1

## 2015-01-27 MED ORDER — SERTRALINE HCL 50 MG PO TABS
150.0000 mg | ORAL_TABLET | Freq: Every day | ORAL | Status: DC
  Administered 2015-01-28: 150 mg via ORAL
  Filled 2015-01-27 (×2): qty 1

## 2015-01-27 MED ORDER — METOPROLOL TARTRATE 1 MG/ML IV SOLN
10.0000 mg | Freq: Four times a day (QID) | INTRAVENOUS | Status: DC | PRN
Start: 2015-01-27 — End: 2015-01-28

## 2015-01-27 NOTE — Clinical Social Work Note (Signed)
CSW Consult Acknowledged:   CSW received a consult for SNF placement. CSW awaiting PT/OT evaluation to determine the appropriate level of care.      Chandra Feger, MSW, LCSWA [phone]  

## 2015-01-27 NOTE — Evaluation (Addendum)
Occupational Therapy Evaluation Patient Details Name: Stacy Smith MRN: [id number] DOB: 19-Aug-1945 Today's Date: 01/27/2015    History of Present Illness 69 y.o. F Hx DM Type II , CKD, HTN, Lupus, and RA who was transfered from [facility] for new onset seizures and altered mental status. She was found on the floor and family called EMS. Her daughter saw the patient having a seizure which lasted about 30 seconds after which she started to snore and grunt and went limp.    Clinical Impression   Pt admitted with above. Pt independent with ADLs, PTA. Feel pt will benefit from acute OT to increase independence, strength, and activity tolerance prior to d/c.    Follow Up Recommendations  Home health OT;Supervision/Assistance - 24 hour    Equipment Recommendations  None recommended by OT    Recommendations for Other Services       Precautions / Restrictions Precautions Precautions: Fall Restrictions Weight Bearing Restrictions: No      Mobility Bed Mobility               General bed mobility comments: not assessed  Transfers Overall transfer level: Needs assistance   Transfers: Sit to/from Stand Sit to Stand: Min guard              Balance Overall balance assessment: History of Falls        Pt with LOB when ambulating requiring assist. Able to perform grooming tasks at sink without LOB.                                  ADL Overall ADL's : Needs assistance/impaired     Grooming: Wash/dry hands;Oral care;Min guard;Standing               Lower Body Dressing: Min guard;Sit to/from stand;Minimal assistance   Toilet Transfer: Min guard;Minimal assistance;Ambulation (assist for ambulation due to LOB; sit to stand from chair)           Functional mobility during ADLs: Minimal assistance General ADL Comments: Educated on energy conservation techniques.      Vision     Perception     Praxis      Pertinent Vitals/Pain  Pain Assessment: 0-10 Pain Score:  (9-10) Pain Location: all over Pain Descriptors / Indicators: Aching Pain Intervention(s): Monitored during session     Hand Dominance     Extremity/Trunk Assessment Upper Extremity Assessment Upper Extremity Assessment: Generalized weakness   Lower Extremity Assessment Lower Extremity Assessment: Defer to PT evaluation       Communication Communication Communication: No difficulties   Cognition Arousal/Alertness: Awake/alert Behavior During Therapy: WFL for tasks assessed/performed Overall Cognitive Status: Within Functional Limits for tasks assessed                     General Comments       Exercises       Shoulder Instructions      Home Living Family/patient expects to be discharged to:: Private residence Living Arrangements: Spouse/significant other;Children Available Help at Discharge: Family;Available 24 hours/day Type of Home: House Home Access: Ramped entrance     Home Layout: Two level;Able to live on main level with bedroom/bathroom     Bathroom Shower/Tub: Tub/shower unit         Home Equipment: Walker - 2 wheels;Shower seat;Bedside commode;Wheelchair - manual          Prior Functioning/Environment Level of Independence: Independent  with assistive device(s)        Comments: used walker at times; holds onto things in house    OT Diagnosis: Generalized weakness   OT Problem List: Pain;Decreased knowledge of precautions;Decreased knowledge of use of DME or AE;Decreased strength;Impaired balance (sitting and/or standing);Decreased activity tolerance   OT Treatment/Interventions: Self-care/ADL training;DME and/or AE instruction;Therapeutic activities;Patient/family education;Balance training;Cognitive remediation/compensation;Therapeutic exercise;Energy conservation    OT Goals(Current goals can be found in the care plan section) Acute Rehab OT Goals Patient Stated Goal: spouse wants her to go  home OT Goal Formulation: With patient/family Time For Goal Achievement: 02/03/15 Potential to Achieve Goals: Good ADL Goals Pt Will Perform Grooming: with set-up;with supervision;standing (4 tasks) Pt Will Perform Lower Body Bathing: with supervision;with set-up;sit to/from stand Pt Will Perform Lower Body Dressing: with set-up;with supervision;sit to/from stand Pt Will Transfer to Toilet: with min guard assist;ambulating Pt Will Perform Toileting - Clothing Manipulation and hygiene: with set-up;with supervision;sit to/from stand Pt Will Perform Tub/Shower Transfer: Tub transfer;ambulating;rolling walker;shower seat;with min guard assist  OT Frequency: Min 2X/week   Barriers to D/C:            Co-evaluation              End of Session Equipment Utilized During Treatment: Gait belt  Activity Tolerance: Patient limited by fatigue Patient left: in chair;with call bell/phone within reach;with chair alarm set;with family/visitor present  Nurse Communication: Went back and spoke with nurse about pt's spouse   Time: 1137-1159 OT Time Calculation (min): 22 min Charges:  OT General Charges $OT Visit: 1 Procedure OT Evaluation $Initial OT Evaluation Tier I: 1 Procedure G-CodesBenito Mccreedy OTR/L [id number] 01/27/2015, 12:53 PM

## 2015-01-27 NOTE — Evaluation (Signed)
Physical Therapy Evaluation Patient Details Name: Stacy Smith MRN: [id number] DOB: 1945-11-21 Today's Date: 01/27/2015   History of Present Illness  69 y.o. F Hx DM Type II , CKD, HTN, Lupus, and RA who was transfered from [facility] for new onset seizures and altered mental status. She was found on the floor and family called EMS. Her daughter saw the patient having a seizure which lasted about 30 seconds after which she started to snore and grunt and went limp.   Clinical Impression  Pt admitted with/for seizures and AMS.  Pt currently limited functionally due to the problems listed below.  (see problems list.)  Pt will benefit from PT to maximize function and safety to be able to get home safely with available assist of family.     Follow Up Recommendations Home health PT;Supervision for mobility/OOB    Equipment Recommendations  None recommended by PT    Recommendations for Other Services       Precautions / Restrictions Precautions Precautions: Fall Restrictions Weight Bearing Restrictions: No      Mobility  Bed Mobility Overal bed mobility: Needs Assistance Bed Mobility: Sit to Supine       Sit to supine: Supervision   General bed mobility comments: not assessed  Transfers Overall transfer level: Needs assistance   Transfers: Sit to/from Stand Sit to Stand: Min guard            Ambulation/Gait Ambulation/Gait assistance: Min assist Ambulation Distance (Feet): 150 Feet (then 15' to/from bathroom to bed.) Assistive device: 1 person hand held assist Gait Pattern/deviations: Step-through pattern   Gait velocity interpretation: Below normal speed for age/gender General Gait Details: mildly unsteady progressing to a mild shaky/tremulous gait as she fatigued.  Stairs            Wheelchair Mobility    Modified Rankin (Stroke Patients Only)       Balance Overall balance assessment: Needs assistance Sitting-balance support: No upper  extremity supported Sitting balance-Leahy Scale: Fair     Standing balance support: Single extremity supported Standing balance-Leahy Scale: Poor                               Pertinent Vitals/Pain Pain Assessment: Faces Pain Score:  (9-10) Faces Pain Scale: Hurts little more Pain Location: all over Pain Descriptors / Indicators: Aching;Sore Pain Intervention(s): Monitored during session;Repositioned    Home Living Family/patient expects to be discharged to:: Private residence Living Arrangements: Spouse/significant other;Children Available Help at Discharge: Family;Available 24 hours/day Type of Home: House Home Access: Ramped entrance     Home Layout: Two level;Able to live on main level with bedroom/bathroom Home Equipment: Gilford Rile - 2 wheels;Shower seat;Bedside commode;Wheelchair - manual      Prior Function Level of Independence: Independent with assistive device(s)         Comments: used walker at times; holds onto things in house     Hand Dominance        Extremity/Trunk Assessment   Upper Extremity Assessment: Defer to OT evaluation           Lower Extremity Assessment: Overall WFL for tasks assessed (mild proximal weakness only)         Communication   Communication: No difficulties  Cognition Arousal/Alertness: Awake/alert Behavior During Therapy: WFL for tasks assessed/performed Overall Cognitive Status: Within Functional Limits for tasks assessed  General Comments General comments (skin integrity, edema, etc.): pt speaks of fears of falling, doesn't get out of the house much and is generally guarded during gait    Exercises        Assessment/Plan    PT Assessment Patient needs continued PT services  PT Diagnosis Difficulty walking;Other (comment) (decr activity tolerance)   PT Problem List Decreased activity tolerance;Decreased mobility;Decreased balance;Decreased knowledge of use of DME   PT Treatment Interventions Gait training;DME instruction;Functional mobility training;Therapeutic activities;Balance training;Patient/family education   PT Goals (Current goals can be found in the Care Plan section) Acute Rehab PT Goals Patient Stated Goal: spouse wants her to go home PT Goal Formulation: With patient Time For Goal Achievement: 02/03/15 Potential to Achieve Goals: Good    Frequency Min 3X/week   Barriers to discharge        Co-evaluation               End of Session   Activity Tolerance: Patient tolerated treatment well;Patient limited by fatigue Patient left: in bed;with call bell/phone within reach Nurse Communication: Mobility status         Time: [id number] PT Time Calculation (min) (ACUTE ONLY): 26 min   Charges:   PT Evaluation $Initial PT Evaluation Tier I: 1 Procedure PT Treatments $Gait Training: 8-22 mins   PT G Codes:        Jourdin Connors, Tessie Fass 01/27/2015, 3:51 PM 01/27/2015  Donnella Sham, [place] [phone]  (pager)

## 2015-01-27 NOTE — Progress Notes (Signed)
[facility] TEAM 1 - Stepdown/ICU TEAM Progress Note  Amal Renbarger [id number] DOB: 07-Jan-1946 DOA: 01/22/2015 PCP: No primary care provider on file.  Admit HPI / Brief Narrative: Stacy Smith is a 69 y.o. WF PMHx  DM Type II , CKD, HTN, Lupus RA,  Presents as a transfer from [facility] for new onset seizures and altered mental status. [facility]. Patients husband states that around 12 PM today she was found on the floor. Patients family called EMS. He states that he asked her to squeeze his finger and she was able to do that as he was concerned for stroke. Patients daughter states that she saw the patient having a seizure which lasted about 30 seconds after which she started to snore and grunt and went limp. She did not vomit and she lost control of her bowel and bladder in the ambulance. Her husband states that she has been sick 3 times with a slow heart beat and also CHF. Patient was admitted at that hospital at that time. She has had a problem with hyperkalemia in the past. She also has CKD III and is a diabetic. She has had a CT scan done in the past which acording to the daughter showed a blood clot which had "sealed off" The daughter states that she also believes that she sees her dead parents and she is also seeing other things in the rooms that are not there.   HPI/Subjective: 9/13 A/O 4 states that her nerves are bad, and that she's been on medication for anxiety for years. Requesting that she be allowed to have her medication restarted. Negative CP, negative SOB, negative N/V. States worked with PT walking around ward today, as well as taking care of ADLs. .     Assessment/Plan: New onset seizures/Altered Mental Status (delirium) -possibly related to not taking her blood pressure meds and elevated BP noted  -Seizure-free, cognition almost back to baseline. 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 See HTN  Anxiety disorder -Increase Zoloft to 150 mg starting tomorrow -Ativan 1 mg q8hr PRN anxiety  Diabetes Mellitus type 2 uncontrolled -Hold Metformin  -Diabetic coordinator -9/8 Hemoglobin A1c= 9.6   Chest pain/sinus tachycardia/demand ischemia -Obtain troponin 3; slightly positive 2 -Obtain echocardiogram -EKG when compared to EKG from 9/8 no significant change  Essential HTN -Increase clonidine 0.4 mg TID -Increase hydralazine 20 mg QID -Coreg 6.25 mg BID -Start Metoprolol IV 46m PRN SBP> 160 -If above medications control patient's BP/HR  convert to PO in the a.m.  Hyperlipidemia -Lipid panel not within ADA guidelines. -Increase Pravachol 80 mg daily  Rheumatoid Arthritis/Lupus -will monitor; lupus can have a neurologic component to it i.e. seizures will defer to neurology. -ESR= 60. With such a high ESR concern would be PMR? Early temporal arteritis. Likely patient does not have any of the other signs or symptoms to indicate above however would monitor most likely secondary to her seizure..  CKD III (unknown baseline) -Stable  Hypokalemia -K Dur 40 mEq 1  Hypomagnesemia -WNL    Code Status: FULL Family Communication: no family present at time of exam Disposition Plan: Per neurology    Consultants: Dr.Peter JLincoln Brigham(neuro hospitalist)   Procedure/Significant Events: 9/10 EEG; abnormal mild to moderate cerebral disturbance (encephalopathy).  9/10 MRI brain with/without contrast;. No acute intracranial abnormality. 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 - Age advanced white matter disease. Likely reflects sequela of chronic microvascular ischemia. 9/11 echocardiogram;- Left  ventricle: mildconcentric hypertrophy.-LVEF= 60% to 65%.  - (grade 1 diastolic dysfunction).   Culture 9/9 CSF NGTD 9/9 CSF Lyme pending 9/9 HSV negative  Antibiotics: NA  DVT prophylaxis: Heparin subcutaneous   Devices    LINES / TUBES:      Continuous Infusions:    Objective: VITAL SIGNS: Temp: 98.5 F (36.9 C) (09/13 0354) Temp Source: Oral (09/13 0354) BP: 227/85 mmHg (09/13 0816) Pulse Rate: 81 (09/13 0401) SPO2; FIO2:   Intake/Output Summary (Last 24 hours) at 01/27/15 7564 Last data filed at 01/27/15 0420  Gross per 24 hour  Intake 1576.25 ml  Output   1575 ml  Net   1.25 ml     Exam: General: A/O 4, follow all commands, negative acute respiratory distress Eyes: Pupils equal round reactive to light and accommodation negative scleral hemorrhage ENT: Negative Runny nose, negative gingival bleeding, Neck:  Negative scars, masses, torticollis, lymphadenopathy, JVD Lungs: Clear to auscultation bilaterally without wheezes or crackles Cardiovascular: Tachycardic, Regular rhythm without murmur gallop or rub normal S1 and S2 Abdomen:negative abdominal pain, negative dysphagia, nondistended, positive soft, bowel sounds, no rebound, no ascites, no appreciable mass Extremities: No significant cyanosis, clubbing, or edema bilateral lower extremities Psychiatric:  Negative depression, positive anxiety, negative fatigue, negative mania  Neurologic:  Cranial nerves II through XII intact, tongue/uvula midline, all extremities muscle strength 5/5, sensation intact throughout, negative dysarthria, negative expressive aphasia, negative receptive aphasia.   Data Reviewed: Basic Metabolic Panel:  Recent Labs Lab 01/23/15 0306 01/23/15 0950 01/23/15 1245 01/24/15 0219 01/25/15 0240 01/26/15 0303 01/27/15 0232  NA 140  --   --  141 137 139 138  K 3.1*  --  3.8 3.5 3.6 3.7 3.2*  CL 105  --   --  110 108 111 109  CO2 23  --   --  23 20* 21* 23  GLUCOSE 179*   --   --  158* 234* 220* 199*  BUN 9  --   --  10 15 8 11   CREATININE 1.25*  --   --  1.26* 1.44* 1.08* 1.10*  CALCIUM 9.1  --   --  8.5* 8.3* 8.5* 8.4*  MG 1.6* 2.8* 2.8* 2.3 1.9 1.6* 2.1  PHOS 1.9* 1.7*  --   --   --   --  3.1   Liver Function Tests:  Recent Labs Lab 01/23/15 0306 01/24/15 0219 01/25/15 0240 01/26/15 0303 01/27/15 0232  AST 49* 30 27 25 26   ALT 16 15 13* 15 14  ALKPHOS 108 96 88 96 96  BILITOT 0.9 1.1 0.4 0.6 0.8  PROT 7.2 6.3* 5.7* 5.7* 5.8*  ALBUMIN 3.4* 3.0* 2.7* 2.7* 2.6*   No results for input(s): LIPASE, AMYLASE in the last 168 hours.  Recent Labs Lab 01/23/15 0950  AMMONIA 61*   CBC:  Recent Labs Lab 01/23/15 0306 01/24/15 0219 01/25/15 0240 01/26/15 0303 01/27/15 0232  WBC 19.1* 9.9 7.6 7.3 6.6  NEUTROABS  --  7.5 5.2 5.1  --   HGB 10.4* 9.8* 8.8* 9.4* 9.4*  HCT 32.2* 30.5* 28.0* 30.1* 30.1*  MCV 81.3 82.4 84.3 83.4 82.9  PLT 227 205 170 185 170   Cardiac Enzymes:  Recent Labs Lab 01/23/15 0304 01/23/15 0950 01/25/15 1000 01/25/15 1545 01/25/15 2133  TROPONINI 0.29*  0.21* 0.04* 0.04* 0.04*   BNP (last 3 results) No results for input(s): BNP in the last 8760 hours.  ProBNP (last 3 results) No results for input(s): PROBNP in the last 8760 hours.  CBG:  Recent Labs Lab 01/25/15 2050 01/26/15 0732 01/26/15 1130 01/26/15 1615 01/26/15 2132  GLUCAP 260* 241* 278* 293* 210*    Recent Results (from the past 240 hour(s))  MRSA PCR Screening     Status: None   Collection Time: 01/22/15  6:45 PM  Result Value Ref Range Status   MRSA by PCR NEGATIVE NEGATIVE Final    Comment:        The GeneXpert MRSA Assay (FDA approved for NASAL specimens only), is one component of a comprehensive MRSA colonization surveillance program. It is not intended to diagnose MRSA infection nor to guide or monitor treatment for MRSA infections.   CSF culture     Status: None   Collection Time: 01/23/15  2:10 PM  Result Value Ref Range  Status   Specimen Description CSF  Final   Special Requests TUBE 1  Final   Gram Stain CYTOSPIN SMEAR NO WBC SEEN NO ORGANISMS SEEN   Final   Culture NO GROWTH 3 DAYS  Final   Report Status 01/26/2015 FINAL  Final     Studies:  Recent x-ray studies have been reviewed in detail by the Attending Physician  Scheduled Meds:  Scheduled Meds: . antiseptic oral rinse  7 mL Mouth Rinse q12n4p  . carvedilol  6.25 mg Oral BID WC  . chlorhexidine  15 mL Mouth Rinse BID  . cloNIDine  0.3 mg Oral TID  . docusate sodium  100 mg Oral BID  . folic acid  1 mg Oral Daily  . heparin  5,000 Units Subcutaneous 3 times per day  . hydrALAZINE  10 mg Oral 4 times per day  . insulin aspart  0-20 Units Subcutaneous TID WC  . insulin aspart  0-5 Units Subcutaneous QHS  . insulin NPH Human  35 Units Subcutaneous BID AC & HS  . multivitamin with minerals  1 tablet Oral Daily  . pantoprazole  40 mg Oral Daily  . phosphorus  500 mg Oral BID  . pravastatin  80 mg Oral q1800  . sertraline  100 mg Oral Daily  . sodium chloride  3 mL Intravenous Q12H  . thiamine  100 mg Oral Daily    Time spent on care of this patient: 40 mins   WOODS, Geraldo Docker , MD  [facility] Office  [phone] Pager - [phone]  On-Call/Text Page:      [email or web address]      password [email or web address]  If 7PM-7AM, please contact night-coverage [email or web address] Password [email or web address] 01/27/2015, 8:38 AM   LOS: 5 days   Care during the described time interval was provided by me .  I have reviewed this patient's available data, including medical history, events of note, physical examination, and all test results as part of my evaluation. I have personally reviewed and interpreted all radiology studies.   Dia Crawford, MD [phone] Pager

## 2015-01-27 NOTE — Care Management Important Message (Signed)
Important Message  Patient Details  Name: Stacy Smith MRN: [id number] Date of Birth: 1945-06-11   Medicare Important Message Given:  Yes-second notification given    Nathen May 01/27/2015, 10:50 AM

## 2015-01-27 NOTE — Progress Notes (Signed)
Transferred Patient to [phone] RN Jonelle Sidle in to receive patient. Patient resting in NAD

## 2015-01-27 NOTE — Progress Notes (Signed)
Speech Language Pathology Treatment: Dysphagia  Patient Details Name: Stacy Smith MRN: [id number] DOB: [birth year] Today's Date: 01/27/2015 Time: 1425-1440 SLP Time Calculation (min) (ACUTE ONLY): 15 min  Assessment / Plan / Recommendation Clinical Impression  F/u for swallowing.  Pt reports some pain upper dentition - scheduled to be removed s/p D/C.  Dysphagia has resolved - MS improved.  Pt animated and talkative.  Active mastication, brisk swallow, and no s/s of aspiration observed. Will sign off - continue current diet.   HPI Other Pertinent Information: Stacy Smith is a 69 y.o. female with prior history of DM Type II CKD HTN Lupus RA presents as a transfer from [facility] for new onset seizures and altered mental status. Patient has had recent visual hallucinations. Has been noncompliant with her antihypertensive medications   Pertinent Vitals Pain Assessment: No/denies pain Pain Score:  (9-10) Pain Location: all over Pain Descriptors / Indicators: Aching Pain Intervention(s): Monitored during session  SLP Plan  All goals met    Recommendations Diet recommendations: Regular;Thin liquid Liquids provided via: Cup;Straw Medication Administration: Whole meds with liquid Supervision: Patient able to self feed              Oral Care Recommendations: Oral care BID Follow up Recommendations: None Plan: All goals met    GO     Juan Quam Laurice 01/27/2015, 2:49 PM

## 2015-01-28 DIAGNOSIS — E785 Hyperlipidemia, unspecified: Secondary | ICD-10-CM

## 2015-01-28 DIAGNOSIS — N183 Chronic kidney disease, stage 3 (moderate): Secondary | ICD-10-CM

## 2015-01-28 DIAGNOSIS — E1165 Type 2 diabetes mellitus with hyperglycemia: Secondary | ICD-10-CM

## 2015-01-28 DIAGNOSIS — M069 Rheumatoid arthritis, unspecified: Secondary | ICD-10-CM

## 2015-01-28 LAB — GLUCOSE, CAPILLARY
GLUCOSE-CAPILLARY: 160 mg/dL — AB (ref 65–99)
Glucose-Capillary: 136 mg/dL — ABNORMAL HIGH (ref 65–99)

## 2015-01-28 MED ORDER — PHENYTOIN SODIUM EXTENDED 200 MG PO CAPS: 200.0000 mg | ORAL_CAPSULE | Freq: Every day | ORAL | Status: DC

## 2015-01-28 MED ORDER — PHENYTOIN SODIUM EXTENDED 100 MG PO CAPS
200.0000 mg | ORAL_CAPSULE | Freq: Every day | ORAL | Status: DC
  Administered 2015-01-28: 200 mg via ORAL
  Filled 2015-01-28: qty 2

## 2015-01-28 MED ORDER — CARVEDILOL 6.25 MG PO TABS: 6.2500 mg | ORAL_TABLET | Freq: Two times a day (BID) | ORAL | Status: DC

## 2015-01-28 MED ORDER — SERTRALINE HCL 50 MG PO TABS: 150.0000 mg | ORAL_TABLET | Freq: Every day | ORAL | Status: DC

## 2015-01-28 MED ORDER — CLONIDINE HCL 0.2 MG PO TABS: 0.3000 mg | ORAL_TABLET | Freq: Three times a day (TID) | ORAL | Status: DC

## 2015-01-28 MED ORDER — PHENYTOIN SODIUM EXTENDED 100 MG PO CAPS: 200.0000 mg | ORAL_CAPSULE | Freq: Every day | ORAL | Status: DC

## 2015-01-28 MED ORDER — INSULIN NPH (HUMAN) (ISOPHANE) 100 UNIT/ML ~~LOC~~ SUSP: 35.0000 [IU] | Freq: Two times a day (BID) | SUBCUTANEOUS | Status: DC

## 2015-01-28 MED ORDER — INSULIN ASPART 100 UNIT/ML ~~LOC~~ SOLN: 0.0000 [IU] | SUBCUTANEOUS | Status: DC

## 2015-01-28 MED ORDER — DOCUSATE SODIUM 100 MG PO CAPS: 100.0000 mg | ORAL_CAPSULE | Freq: Two times a day (BID) | ORAL | Status: DC

## 2015-01-28 NOTE — Progress Notes (Signed)
Physical Therapy Treatment Patient Details Name: Stacy Smith MRN: [id number] DOB: 1946/05/02 Today's Date: 01/28/2015    History of Present Illness 69 y.o. F Hx DM Type II , CKD, HTN, Lupus, and RA who was transfered from [facility] for new onset seizures and altered mental status. She was found on the floor and family called EMS. Her daughter saw the patient having a seizure which lasted about 30 seconds after which she started to snore and grunt and went limp.     PT Comments    Vestibular assessment did not reveal anything significant at this time.  Pt with some motion sensitivity and good compensation.  She continues to be appropriate for HHPT at discharge as she is unsteady on her feet.  PT will follow acutely.   Follow Up Recommendations  Home health PT;Supervision for mobility/OOB     Equipment Recommendations  None recommended by PT    Recommendations for Other Services   NA     Precautions / Restrictions Precautions Precautions: Fall    Mobility  Bed Mobility Overal bed mobility: Modified Independent Bed Mobility: Rolling;Supine to Sit;Sit to Supine Rolling: Modified independent (Device/Increase time)   Supine to sit: Modified independent (Device/Increase time) Sit to supine: Modified independent (Device/Increase time)   General bed mobility comments: uses rails for leverage  Transfers Overall transfer level: Needs assistance Equipment used: None Transfers: Sit to/from [facility] Sit to Stand: Supervision Stand pivot transfers: Supervision       General transfer comment: supervision for safety as pt has heavy reliance on hands for balance during transitions and transfer      01/28/15 1739  Vestibular Assessment  General Observation Pt sitting comfortably in recliner chair, eye alignment good.  Wears glasses for distance and driving.   Symptom Behavior  Type of Dizziness Imbalance  Frequency of Dizziness when turning quickly   Duration of Dizziness <30 seconds  Aggravating Factors Comment;Turning body quickly;Turning head quickly (heights and being in the car)  Relieving Factors Slow movements  Occulomotor Exam  Occulomotor Alignment Normal  Spontaneous Absent  Gaze-induced Absent  Smooth Pursuits Intact  Saccades Intact  Vestibulo-Occular Reflex  VOR 1 Head Only (x 1 viewing) normal  Auditory  Comments WNL equal bil, no tinnitus or fullness  Positional Testing  Dix-Hallpike Dix-Hallpike Right;Dix-Hallpike Left  Sidelying Test Sidelying Right;Sidelying Left  Horizontal Canal Testing Horizontal Canal Right;Horizontal Canal Left  Dix-Hallpike Right  Dix-Hallpike Right Duration 0  Dix-Hallpike Right Symptoms No nystagmus  Dix-Hallpike Left  Dix-Hallpike Left Duration 0  Dix-Hallpike Left Symptoms No nystagmus  Sidelying Right  Sidelying Right Duration 0  Sidelying Right Symptoms No nystagmus  Sidelying Left  Sidelying Left Duration 0  Sidelying Left Symptoms No nystagmus  Horizontal Canal Right  Horizontal Canal Right Duration 0  Horizontal Canal Right Symptoms Normal  Horizontal Canal Left  Horizontal Canal Left Duration 0  Horizontal Canal Left Symptoms Normal  Cognition  Cognition Orientation Level Oriented x 4     Balance Overall balance assessment: Needs assistance Sitting-balance support: Feet supported;No upper extremity supported Sitting balance-Leahy Scale: Good     Standing balance support: Bilateral upper extremity supported Standing balance-Leahy Scale: Fair                      Cognition Arousal/Alertness: Awake/alert Behavior During Therapy: WFL for tasks assessed/performed Overall Cognitive Status: Within Functional Limits for tasks assessed  General Comments General comments (skin integrity, edema, etc.): Vestibular assessment completed see grid for detials.       Pertinent Vitals/Pain Pain Assessment: No/denies pain            PT Goals (current goals can now be found in the care plan section) Acute Rehab PT Goals Patient Stated Goal: to go home Progress towards PT goals: Progressing toward goals    Frequency  Min 3X/week    PT Plan Current plan remains appropriate       End of Session   Activity Tolerance: Patient tolerated treatment well Patient left: in bed;with call bell/phone within reach;with family/visitor present     Time: [id number] PT Time Calculation (min) (ACUTE ONLY): 30 min  Charges:  $Therapeutic Activity: 23-37 mins                      Quaid Yeakle B. [place], [place], DPT [phone]   01/28/2015, 5:39 PM

## 2015-01-28 NOTE — Progress Notes (Signed)
Occupational Therapy Treatment Patient Details Name: Stacy Smith MRN: [id number] DOB: 29-Jul-1945 Today's Date: 01/28/2015    History of present illness 69 y.o. F Hx DM Type II , CKD, HTN, Lupus, and RA who was transfered from [facility] for new onset seizures and altered mental status. She was found on the floor and family called EMS. Her daughter saw the patient having a seizure which lasted about 30 seconds after which she started to snore and grunt and went limp.    OT comments  Patient progressing towards OT goals, continue plan of care for now. Pt limited by decreased dynamic standing balance and reports she gets dizzy at times when turning head, reporting this has been going on for a while. Pt may benefit from a vestibular evaluation.    Follow Up Recommendations  Home health OT;Supervision/Assistance - 24 hour    Equipment Recommendations  None recommended by OT    Recommendations for Other Services Other (comment) (vestibular evaluation)   Precautions / Restrictions Precautions Precautions: Fall Restrictions Weight Bearing Restrictions: No    Mobility Bed Mobility General bed mobility comments: Pt found seated EOB upon OT entering room  Transfers Overall transfer level: Needs assistance   Transfers: Sit to/from Stand Sit to Stand: Min guard General transfer comment: Min guard for safety and decreased balance    Balance Overall balance assessment: Needs assistance Sitting-balance support: No upper extremity supported;Feet supported Sitting balance-Leahy Scale: Good     Standing balance support: During functional activity;Single extremity supported Standing balance-Leahy Scale: Poor   ADL Overall ADL's : Needs assistance/impaired  General ADL Comments: Pt found seated EOB eating breakfast. Pt with decreased memory, telling therapist same story a couple of times. Pt able to cross BLEs for LB ADLs. Pt performed sit<>stand with min guard assistance. Pt states  she has been walking <> BR with nursing staff prn for toielting. Pt required min guard>min assist with functional ambulation (hand held). Pt with complaints of some dizziness when turning head during mobility and pt with slight LOB, requiring therapists assistance.      Cognition   Behavior During Therapy: WFL for tasks assessed/performed Overall Cognitive Status: Within Functional Limits for tasks assessed Memory: Decreased short-term memory (pt repeating same story to therapist)    Pertinent Vitals/ Pain       Pain Assessment: No/denies pain   Frequency Min 2X/week     Progress Toward Goals  OT Goals(current goals can now befound in the care plan section)  Progress towards OT goals: Progressing toward goals     Plan Discharge plan remains appropriate    Activity Tolerance Patient tolerated treatment well  Patient Left in bed;with call bell/phone within reach (seated EOB eating breakfast)    Time: [id number] OT Time Calculation (min): 18 min  Charges: OT General Charges $OT Visit: 1 Procedure OT Treatments $Self Care/Home Management : 8-22 mins  Jesus Nevills,[place]elit[place] , MS, OTR/L, CLT Pager: [phone]  01/28/2015, 10:35 AM

## 2015-01-28 NOTE — Progress Notes (Signed)
MEDICATION RELATED CONSULT NOTE - INITIAL   Pharmacy Consult for Dilantin Indication: new onset seizure  Allergies  Allergen Reactions  . Latex     Patient Measurements: Height: 5\' 2"  (157.5 cm) Weight: 156 lb 1.4 oz (70.8 kg) IBW/kg (Calculated) : 50.1  Vital Signs: Temp: 98.9 F (37.2 C) (09/14 0551) Temp Source: Oral (09/14 0551) BP: 160/71 mmHg (09/14 0551) Pulse Rate: 37 (09/14 0551) Intake/Output from previous day: 09/13 0701 - 09/14 0700 In: 243 [P.O.:240; I.V.:3] Out: 375 [Urine:375] Intake/Output from this shift: Total I/O In: 222 [P.O.:222] Out: 400 [Urine:400]  Labs:  Recent Labs  01/26/15 0303 01/27/15 0232  WBC 7.3 6.6  HGB 9.4* 9.4*  HCT 30.1* 30.1*  PLT 185 170  CREATININE 1.08* 1.10*  MG 1.6* 2.1  PHOS  --  3.1  ALBUMIN 2.7* 2.6*  PROT 5.7* 5.8*  AST 25 26  ALT 15 14  ALKPHOS 96 96  BILITOT 0.6 0.8   Estimated Creatinine Clearance: 44.5 mL/min (by C-G formula based on Cr of 1.1).   Microbiology: Recent Results (from the past 720 hour(s))  MRSA PCR Screening     Status: None   Collection Time: 01/22/15  6:45 PM  Result Value Ref Range Status   MRSA by PCR NEGATIVE NEGATIVE Final    Comment:        The GeneXpert MRSA Assay (FDA approved for NASAL specimens only), is one component of a comprehensive MRSA colonization surveillance program. It is not intended to diagnose MRSA infection nor to guide or monitor treatment for MRSA infections.   CSF culture     Status: None   Collection Time: 01/23/15  2:10 PM  Result Value Ref Range Status   Specimen Description CSF  Final   Special Requests TUBE 1  Final   Gram Stain CYTOSPIN SMEAR NO WBC SEEN NO ORGANISMS SEEN   Final   Culture NO GROWTH 3 DAYS  Final   Report Status 01/26/2015 FINAL  Final    Medical History: Past Medical History  Diagnosis Date  . Hypertension   . CHF (congestive heart failure)   . Diabetes mellitus without complication   . Chronic kidney disease     CKD Stage 3  . Seizures   . Anxiety   . Depression   . Lupus   . Rheumatoid arthritis   . Hyperkalemia   . Bradycardia   . Peripheral neuropathy   . Shortness of breath dyspnea     Medications:  Scheduled:  . carvedilol  6.25 mg Oral BID WC  . cloNIDine  0.4 mg Oral TID  . docusate sodium  100 mg Oral BID  . folic acid  1 mg Oral Daily  . heparin  5,000 Units Subcutaneous 3 times per day  . hydrALAZINE  20 mg Oral 4 times per day  . insulin aspart  0-20 Units Subcutaneous TID WC  . insulin aspart  0-5 Units Subcutaneous QHS  . insulin NPH Human  35 Units Subcutaneous BID AC & HS  . multivitamin with minerals  1 tablet Oral Daily  . pantoprazole  40 mg Oral Daily  . phenytoin  200 mg Oral Daily  . potassium chloride  40 mEq Oral Once  . pravastatin  80 mg Oral q1800  . sertraline  150 mg Oral Daily  . sodium chloride  3 mL Intravenous Q12H  . thiamine  100 mg Oral Daily    Assessment: 69 yo F who had a seizure PTA and was treated at  outside hospital with Dilantin.  Pt developed AMS and was found to have supratx level on Dilantin 100mg  PO q8h.  Last dose 9/10.  Now with plans to restart.  9/10: total phenytoin level 21.7 - corrects to 31; hold today - was on 100 mg TID 9/11: total phenytoin level 18.8 - corrects to 29.4; hold today 9/12: total phenytoin level 12.8 - corrects to 20; (albumin 3.4>2.7)  9/14: Dr. Wyline Copas discussed with neurology and plan to restart Dilantin - per Rx protocol.   Goal of Therapy:  Dilantin level 10-20 mcg/ml  Plan:  Will start at 200mg  daily (was supratx on 100mg  q8h). Next level in 5-7 days or if symptoms of toxicity return.  [employer], Pharm.D., BCPS Clinical Pharmacist Pager [phone] 01/28/2015 1:53 PM

## 2015-01-28 NOTE — Progress Notes (Signed)
NURSING PROGRESS NOTE  Stacy Smith [id number] Discharge Data: 01/28/2015 4:36 PM Attending Provider: Donne Hazel, MD PCP:No primary care provider on file.   [name] to be D/C'd Home per MD order with husband, with hard copies of all prescriptions via wheelchair by NA Rodman Pickle.    All IV's will be discontinued and monitored for bleeding.  All belongings will be returned to patient for patient to take home.  Last Documented Vital Signs:  Blood pressure 156/60, pulse 73, temperature 98.3 F (36.8 C), temperature source Oral, resp. rate 18, height 5\' 2"  (1.575 m), weight 70.8 kg (156 lb 1.4 oz), SpO2 99 %.  Hendricks Limes RN, BS, BSN
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
## 2015-01-28 NOTE — Care Management Note (Signed)
Case Management Note  Patient Details  Name: Alyce Crescenzo MRN: [id number] Date of Birth: 02/15/1946  Subjective/Objective:                 H PT set up through [facility]. Patient refused any other home health services.   Action/Plan:  DC to home.  Expected Discharge Date:                  Expected Discharge Plan:  Home/Self Care  In-House Referral:     Discharge planning Services  CM Consult  Post Acute Care Choice:    Choice offered to:     DME Arranged:    DME Agency:     HH Arranged:  PT HH Agency:   ([place])  Status of Service:  Completed, signed off  Medicare Important Message Given:  Yes-second notification given Date Medicare IM Given:    Medicare IM give by:    Date Additional Medicare IM Given:    Additional Medicare Important Message give by:     If discussed at [place] of Stay Meetings, dates discussed:    Additional Comments:  Carles Collet, RN 01/28/2015, 12:48 PM

## 2015-01-28 NOTE — Discharge Instructions (Signed)
°  Please do not drive, operate heavy machinery, or hold delicate objects such as small children until cleared by your primary doctor

## 2015-01-28 NOTE — Progress Notes (Signed)
Patient transported to [facility] room via wheelchair to meet with Case Manager Debbie and social work.

## 2015-01-28 NOTE — Progress Notes (Signed)
Faxed HH order, Facesheet and H&P to [facility] per request of admission coordinator. Faxed received

## 2015-01-28 NOTE — Discharge Summary (Signed)
Physician Discharge Summary  Stacy Smith TMA:[id number] DOB: Dec 03, 1945 DOA: 01/22/2015  PCP: No primary care provider on file.  Admit date: 01/22/2015 Discharge date: 01/28/2015  Time spent: 20 minutes  Recommendations for Outpatient Follow-up:  1. Follow up with PCP in 1-2 weeks 2. Please repeat dilantin level in 7-10 days  Discharge Diagnoses:  Principal Problem:   Convulsions/seizures Active Problems:   Diabetes   Essential hypertension   Hyperlipidemia   Seizure   Convulsion   Altered mental state   HLD (hyperlipidemia)   Rheumatoid arthritis   Lupus (systemic lupus erythematosus)   CKD (chronic kidney disease), stage III   Hypokalemia   Hypomagnesemia   Diabetes type 2, controlled   Seizures   Delirium   Diabetes type 2, uncontrolled   Demand ischemia   Sinus tachycardia   Ischemic chest pain   Discharge Condition: Improved  Diet recommendation: Diabetic  Filed Weights   01/25/15 0400 01/26/15 0500 01/27/15 0401  Weight: 72 kg (158 lb 11.7 oz) 72.1 kg (158 lb 15.2 oz) 70.8 kg (156 lb 1.4 oz)    History of present illness:  Please see admit h and p from 9/8 for details. Briefly, pt presents with new onset seizures and mental status changes. The patient was admitted for further work up.  Hospital Course:  New onset seizures/Altered Mental Status (delirium) -possibly related to not taking her blood pressure meds and elevated BP  -Patient remained seizure-free, cognition returned to almost back to baseline. 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 Neurology temporarily held Dilantin, resumed on 9/14 as levels were within therapeutic range, per Neurology. Seizure activity/AMS may be secondary to uncontrolled BP/episodic ventricular tachycardia. See HTN -Patient was advised not  to drive, operate heavy machinery, or hold delicate objects such as small children until cleared by PCP  Anxiety disorder -Increased Zoloft to 150 mg -Ativan 1 mg q8hr PRN anxiety  Diabetes Mellitus type 2 uncontrolled -Held Metformin  -Diabetic coordinator consulted -9/8 Hemoglobin A1c= 9.6   Chest pain/sinus tachycardia/demand ischemia -Obtain troponin 3; slightly positive 2 -Echocardiogram with no wall motion abnormality, normal LVEF -EKG when compared to EKG from 9/8 no significant change  Essential HTN -While hospitalized, had increased clonidine 0.4 mg TID, d/c'd home on 0.39m po TID -Increased hydralazine to 20 mg QID -Coreg 6.25 mg BID  Hyperlipidemia -Lipid panel not within ADA guidelines. -Increase Pravachol 80 mg daily  Rheumatoid Arthritis/Lupus -ESR= 60. -Remained stable  CKD III (unknown baseline) -Stable  Hypokalemia -K Dur 40 mEq 1  Hypomagnesemia -WNL  Consultations:  Neurology  Discharge Exam: Filed Vitals:   01/27/15 2041 01/27/15 2113 01/28/15 0551 01/28/15 1359  BP: 150/72 164/68 160/71 156/60  Pulse:  74 37 73  Temp:  98.4 F (36.9 C) 98.9 F (37.2 C) 98.3 F (36.8 C)  TempSrc:  Oral Oral Oral  Resp: 20 18 20 18   Height:      Weight:      SpO2:  98% 99% 99%    General: Awake, in nad Cardiovascular: regular, s1, s2 Respiratory: normal resp effort, no wheezing  Discharge Instructions     Medication List    STOP taking these medications        LEVEMIR FLEXTOUCH 100 UNIT/ML Pen  Generic drug:  Insulin Detemir     metFORMIN 1000 MG tablet  Commonly known as:  GLUCOPHAGE  tiZANidine 4 MG tablet  Commonly known as:  ZANAFLEX      TAKE these medications        carvedilol 6.25 MG tablet  Commonly known as:  COREG  Take 1 tablet (6.25 mg total) by mouth 2 (two) times daily with a meal.     cloNIDine 0.2 MG tablet  Commonly known as:  CATAPRES  Take 1.5 tablets (0.3 mg total) by mouth 3 (three) times daily.      docusate sodium 100 MG capsule  Commonly known as:  COLACE  Take 1 capsule (100 mg total) by mouth 2 (two) times daily.     insulin NPH Human 100 UNIT/ML injection  Commonly known as:  HUMULIN N,NOVOLIN N  Inject 0.35 mLs (35 Units total) into the skin 2 (two) times daily at 8 am and 10 pm.     LORazepam 1 MG tablet  Commonly known as:  ATIVAN  Take 1 mg by mouth 3 (three) times daily.     omeprazole 40 MG capsule  Commonly known as:  PRILOSEC  Take 40 mg by mouth daily.     ondansetron 8 MG disintegrating tablet  Commonly known as:  ZOFRAN-ODT  Take 8 mg by mouth 2 (two) times daily as needed for nausea or vomiting.     phenytoin 200 MG ER capsule  Commonly known as:  DILANTIN  Take 1 capsule (200 mg total) by mouth at bedtime.  Start taking on:  01/29/2015     pravastatin 80 MG tablet  Commonly known as:  PRAVACHOL  Take 80 mg by mouth daily.     sertraline 50 MG tablet  Commonly known as:  ZOLOFT  Take 3 tablets (150 mg total) by mouth daily.       Allergies  Allergen Reactions  . Latex    Follow-up Information    Follow up with FAX Number for office:.   Why:  [phone]      Follow up with [facility].   Why:  [place] [facility] will call innext 24 to 48 hours to set up first home health visit Sept 23/24.   Contact information:   [phone]      Follow up with Follow up with PCP in 1-2 weeks.   Why:  Hospital follow up       The results of significant diagnostics from this hospitalization (including imaging, microbiology, ancillary and laboratory) are listed below for reference.    Significant Diagnostic Studies: Mr Kizzie Fantasia Contrast  01/26/15   CLINICAL DATA:  Altered mental status. New onset seizure. Mental status improving. Recent lumbar puncture.  EXAM: MRI HEAD WITHOUT AND WITH CONTRAST  TECHNIQUE: Multiplanar, multiecho pulse sequences of the brain and surrounding structures were obtained without and with intravenous contrast.  CONTRAST:   10m MULTIHANCE GADOBENATE DIMEGLUMINE 529 MG/ML IV SOLN  COMPARISON:  CT head without contrast 01/22/2015.  FINDINGS: The diffusion-weighted images demonstrate no evidence for acute or subacute infarct. Remote infarcts are present in the left basal ganglia and centrum semi ovale.  No acute infarct or hemorrhage is present. The ventricles are of normal size. No significant extra-axial fluid collection is present.  Flow is present in the major intracranial arteries. The globes and orbits are intact. Mild mucosal thickening is present in the ethmoid air cells. The remaining paranasal sinuses and the mastoid air cells are clear.  The postcontrast images demonstrate no pathologic enhancement. The skullbase is within normal limits. Midline structures are unremarkable.  IMPRESSION: 1. No  acute intracranial abnormality. 2. 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 3. 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 4. The study is moderately degraded by patient motion.   Electronically Signed   By: San Morelle M.D.   On: 01/24/2015 14:38   Dg Chest Port 1 View  01/25/2015   CLINICAL DATA:  Left upper chest pain and shortness of breath  EXAM: PORTABLE CHEST - 1 VIEW  COMPARISON:  01/22/2015  FINDINGS: The heart size and mediastinal contours are within normal limits. Both lungs are clear but hypo aerated with crowding of the bronchovascular markings. The visualized skeletal structures are unremarkable.  IMPRESSION: No active disease.   Electronically Signed   By: Conchita Paris M.D.   On: 01/25/2015 11:54   Dg Fluoro Guide Ndl Plcd/bx/inj/loc  01/23/2015   CLINICAL DATA:  Altered mental status.  Failed attempt on the floor.  EXAM: DIAGNOSTIC LUMBAR PUNCTURE UNDER FLUOROSCOPIC GUIDANCE  FLUOROSCOPY TIME:  Radiation Exposure Index (as provided by the fluoroscopic device): 7.2 mGy  If the device does not provide the  exposure index:  Fluoroscopy Time (in minutes and seconds):  0.8 minutes  Number of Acquired Images:  None, only saved fluoroscopy  PROCEDURE: Informed consent was obtained from the patient's next of kin by the clinical service. The patient prone, the lower back was prepped with Betadine. The patient was extremely agitated, requiring multiple staff to contain her on the table. 1% Lidocaine was used for local anesthesia. Lumbar puncture was performed at the L3-4 level using a 20 gauge needle with return of clear CSF. Only 4 cc of CSF could be obtained before procedure had to be terminated due to patient extreme motion. There are no apparent complications.  IMPRESSION: Successful lumbar puncture. Due to marked agitation, only 4 cc could be obtained.   Electronically Signed   By: Monte Fantasia M.D.   On: 01/23/2015 15:04    Microbiology: Recent Results (from the past 240 hour(s))  MRSA PCR Screening     Status: None   Collection Time: 01/22/15  6:45 PM  Result Value Ref Range Status   MRSA by PCR NEGATIVE NEGATIVE Final    Comment:        The GeneXpert MRSA Assay (FDA approved for NASAL specimens only), is one component of a comprehensive MRSA colonization surveillance program. It is not intended to diagnose MRSA infection nor to guide or monitor treatment for MRSA infections.   CSF culture     Status: None   Collection Time: 01/23/15  2:10 PM  Result Value Ref Range Status   Specimen Description CSF  Final   Special Requests TUBE 1  Final   Gram Stain CYTOSPIN SMEAR NO WBC SEEN NO ORGANISMS SEEN   Final   Culture NO GROWTH 3 DAYS  Final   Report Status 01/26/2015 FINAL  Final     Labs: Basic Metabolic Panel:  Recent Labs Lab 01/23/15 0306 01/23/15 0950 01/23/15 1245 01/24/15 0219 01/25/15 0240 01/26/15 0303 01/27/15 0232  NA 140  --   --  141 137 139 138  K 3.1*  --  3.8 3.5 3.6 3.7 3.2*  CL 105  --   --  110 108 111 109  CO2 23  --   --  23 20* 21* 23  GLUCOSE 179*   --   --  158* 234* 220* 199*  BUN 9  --   --  10  15 8 11   CREATININE 1.25*  --   --  1.26* 1.44* 1.08* 1.10*  CALCIUM 9.1  --   --  8.5* 8.3* 8.5* 8.4*  MG 1.6* 2.8* 2.8* 2.3 1.9 1.6* 2.1  PHOS 1.9* 1.7*  --   --   --   --  3.1   Liver Function Tests:  Recent Labs Lab 01/23/15 0306 01/24/15 0219 01/25/15 0240 01/26/15 0303 01/27/15 0232  AST 49* 30 27 25 26   ALT 16 15 13* 15 14  ALKPHOS 108 96 88 96 96  BILITOT 0.9 1.1 0.4 0.6 0.8  PROT 7.2 6.3* 5.7* 5.7* 5.8*  ALBUMIN 3.4* 3.0* 2.7* 2.7* 2.6*   No results for input(s): LIPASE, AMYLASE in the last 168 hours.  Recent Labs Lab 01/23/15 0950  AMMONIA 61*   CBC:  Recent Labs Lab 01/23/15 0306 01/24/15 0219 01/25/15 0240 01/26/15 0303 01/27/15 0232  WBC 19.1* 9.9 7.6 7.3 6.6  NEUTROABS  --  7.5 5.2 5.1  --   HGB 10.4* 9.8* 8.8* 9.4* 9.4*  HCT 32.2* 30.5* 28.0* 30.1* 30.1*  MCV 81.3 82.4 84.3 83.4 82.9  PLT 227 205 170 185 170   Cardiac Enzymes:  Recent Labs Lab 01/23/15 0304 01/23/15 0950 01/25/15 1000 01/25/15 1545 01/25/15 2133  TROPONINI 0.29* 0.21* 0.04* 0.04* 0.04*   BNP: BNP (last 3 results) No results for input(s): BNP in the last 8760 hours.  ProBNP (last 3 results) No results for input(s): PROBNP in the last 8760 hours.  CBG:  Recent Labs Lab 01/27/15 1119 01/27/15 1600 01/27/15 2122 01/28/15 0812 01/28/15 1153  GLUCAP 194* 283* 79 160* 136*    Signed:  Wendelin Reader K  [facility] 01/28/2015, 7:09 PM
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
## 2015-02-13 LAB — B. BURGDORFI ANTIBODIES, CSF
Albumin CSF: 31.5 mg/dL (ref 10.4–43.2)
IgG Total CSF: 4.75 mg/dl — ABNORMAL HIGH (ref 0.00–3.06)
IgM Total CSF: 0.3 mg/dL (ref 0.0–0.6)

## 2015-12-07 ENCOUNTER — Encounter: Payer: Self-pay | Admitting: *Deleted

## 2015-12-08 ENCOUNTER — Ambulatory Visit (INDEPENDENT_AMBULATORY_CARE_PROVIDER_SITE_OTHER): Payer: Medicare HMO | Admitting: Cardiology

## 2015-12-08 ENCOUNTER — Encounter: Payer: Self-pay | Admitting: Cardiology

## 2015-12-08 VITALS — BP 201/71 | HR 83 | Ht 60.0 in | Wt 166.2 lb

## 2015-12-08 DIAGNOSIS — N183 Chronic kidney disease, stage 3 unspecified: Secondary | ICD-10-CM

## 2015-12-08 DIAGNOSIS — I5033 Acute on chronic diastolic (congestive) heart failure: Secondary | ICD-10-CM | POA: Diagnosis not present

## 2015-12-08 DIAGNOSIS — I10 Essential (primary) hypertension: Secondary | ICD-10-CM

## 2015-12-08 NOTE — Progress Notes (Signed)
Clinical Summary Stacy Smith is a 70 y.o.female seen today as a new patient. She is referred by Dr Elvina Mattes. Previously seen by Dr Hamilton Capri of [facility].   1. SOB/Diastolic HF - seen at [facility] 11/26/15 with SOB and LE edema - diagnosed with acute diastolic HF, diuresed during that admission.  - 11/2015 [facility] echo: LVEF 50-55%, abnormal diastolic function, moderate LAE, RVSP 80, normal RV size function is not reported.  - she reports weight 179 lbs prior to recent admision, today 166. Weight 07/2015 152 lbs.   - still some SOB since discharge. Still with some orthopnea and edema - limiting sodium intake.     2. CKD III - has appointment pending with nephrology    3. HTN - checks daily, typically around 140s/80s    Past Medical History:  Diagnosis Date  . Anxiety   . Bradycardia   . CHF (congestive heart failure)   . Chronic kidney disease    CKD Stage 3  . Depression   . Diabetes mellitus without complication   . Hyperkalemia   . Hypertension   . Lupus   . Peripheral neuropathy   . Rheumatoid arthritis   . Seizures   . Shortness of breath dyspnea      Allergies  Allergen Reactions  . Latex      Current Outpatient Prescriptions  Medication Sig Dispense Refill  . carvedilol (COREG) 6.25 MG tablet Take 1 tablet (6.25 mg total) by mouth 2 (two) times daily with a meal. 60 tablet 0  . cloNIDine (CATAPRES) 0.2 MG tablet Take 1.5 tablets (0.3 mg total) by mouth 3 (three) times daily. 60 tablet 0  . docusate sodium (COLACE) 100 MG capsule Take 1 capsule (100 mg total) by mouth 2 (two) times daily. 10 capsule 0  . insulin NPH Human (HUMULIN N,NOVOLIN N) 100 UNIT/ML injection Inject 0.35 mLs (35 Units total) into the skin 2 (two) times daily at 8 am and 10 pm. 10 mL 0  . LORazepam (ATIVAN) 1 MG tablet Take 1 mg by mouth 3 (three) times daily.    Marland Kitchen omeprazole (PRILOSEC) 40 MG capsule Take 40 mg by mouth daily.    . ondansetron (ZOFRAN-ODT) 8 MG  disintegrating tablet Take 8 mg by mouth 2 (two) times daily as needed for nausea or vomiting.     . phenytoin (DILANTIN) 200 MG ER capsule Take 1 capsule (200 mg total) by mouth at bedtime. 30 capsule 0  . pravastatin (PRAVACHOL) 80 MG tablet Take 80 mg by mouth daily.    . sertraline (ZOLOFT) 50 MG tablet Take 3 tablets (150 mg total) by mouth daily. 30 tablet 0   No current facility-administered medications for this visit.      Past Surgical History:  Procedure Laterality Date  . ABDOMINAL HYSTERECTOMY    . APPENDECTOMY    . CYST EXCISION    . DENTAL SURGERY  01/18/15     Allergies  Allergen Reactions  . Latex       No family history on file.   Social History Stacy Smith reports that she has never smoked. She does not have any smokeless tobacco history on file. Stacy Smith reports that she does not drink alcohol.   Review of Systems CONSTITUTIONAL: No weight loss, fever, chills, weakness or fatigue.  HEENT: Eyes: No visual loss, blurred vision, double vision or yellow sclerae.No hearing loss, sneezing, congestion, runny nose or sore throat.  SKIN: No rash or itching.  CARDIOVASCULAR: per  HPI RESPIRATORY: No shortness of breath, cough or sputum.  GASTROINTESTINAL: No anorexia, nausea, vomiting or diarrhea. No abdominal pain or blood.  GENITOURINARY: No burning on urination, no polyuria NEUROLOGICAL: No headache, dizziness, syncope, paralysis, ataxia, numbness or tingling in the extremities. No change in bowel or bladder control.  MUSCULOSKELETAL: No muscle, back pain, joint pain or stiffness.  LYMPHATICS: No enlarged nodes. No history of splenectomy.  PSYCHIATRIC: No history of depression or anxiety.  ENDOCRINOLOGIC: No reports of sweating, cold or heat intolerance. No polyuria or polydipsia.  Marland Kitchen   Physical Examination Vitals:   12/08/15 1054  BP: (!) 201/71  Pulse: 83   Vitals:   12/08/15 1054  Weight: 166 lb 3.2 oz (75.4 kg)  Height: 5' (1.524 m)   Manual bp  190/80  Gen: resting comfortably, no acute distress HEENT: no scleral icterus, pupils equal round and reactive, no palptable cervical adenopathy,  CV: RRR, no m/r/g, JVD to angle of jaw.  Resp: Clear to auscultation bilaterally GI: abdomen is soft, non-tender, non-distended, normal bowel sounds, no hepatosplenomegaly MSK: extremities are warm, no edema.  Skin: warm, no rash Neuro:  no focal deficits Psych: appropriate affect     Assessment and Plan  1. HTN - elevated in clinic, she reports a history of white coat HTN. She will keep bp log and update [place] in a few days. Given her CKD goal bp <130/80  2. Acute on chronic diastolic HF - recent admission with fluid overload, improving but not resolved Continue lasix 40mg  daily, will repeat her labs. She will check daily weights and update [place] in the next few days.   3. CKD III - follow renal function closely with diuresis, we will repeat labs.        Arnoldo Lenis, M.D.

## 2015-12-08 NOTE — Patient Instructions (Signed)
Your physician recommends that you schedule a follow-up appointment in: 1 month with Dr. Harl Bowie  Your physician recommends that you continue on your current medications as directed. Please refer to the Current Medication list given to you today.  Your physician recommends that you return for lab work BMP/MG/TSH  Your physician has requested that you regularly monitor and record your blood pressure readings at home. Please use the same machine at the same time of day to check your readings and record them to bring to your follow-up visit.  Your physician recommends that you weigh, daily, at the same time every day, and in the same amount of clothing. Please record your daily weights on the handout provided and bring it to your next appointment.   PLEASE UPDATE [place] ON BLOOD PRESSURE AND WEIGHT Friday.   PLEASE CALL us TO GO OVER THE CORRECT DOSE OF MEDICATION THAT YOU ARE TAKING  Thank you for choosing [facility]!!

## 2016-01-07 ENCOUNTER — Ambulatory Visit: Payer: Medicare HMO | Admitting: Cardiology

## 2016-02-08 ENCOUNTER — Ambulatory Visit (INDEPENDENT_AMBULATORY_CARE_PROVIDER_SITE_OTHER): Payer: Medicare HMO | Admitting: Cardiology

## 2016-02-08 ENCOUNTER — Encounter: Payer: Self-pay | Admitting: Cardiology

## 2016-02-08 VITALS — BP 161/94 | HR 77 | Ht 60.0 in | Wt 144.0 lb

## 2016-02-08 DIAGNOSIS — I10 Essential (primary) hypertension: Secondary | ICD-10-CM

## 2016-02-08 DIAGNOSIS — N183 Chronic kidney disease, stage 3 unspecified: Secondary | ICD-10-CM

## 2016-02-08 DIAGNOSIS — I272 Other secondary pulmonary hypertension: Secondary | ICD-10-CM

## 2016-02-08 DIAGNOSIS — I5032 Chronic diastolic (congestive) heart failure: Secondary | ICD-10-CM | POA: Diagnosis not present

## 2016-02-08 NOTE — Patient Instructions (Addendum)
Medication Instructions:   Continue all current medications.  Labwork:  BMET, Magnesium - orders given today.  Testing/Procedures:  Your physician has requested that you have an echocardiogram. Echocardiography is a painless test that uses sound waves to create images of your heart. It provides your doctor with information about the size and shape of your heart and how well your heart's chambers and valves are working. This procedure takes approximately one hour. There are no restrictions for this procedure.  Follow-Up:  Office will contact with results via phone or letter.    2 months   Any Other Special Instructions Will Be Listed Below (If Applicable).  Your physician has requested that you regularly monitor and record your blood pressure readings at home till Friday & call / drop off results for MD review.  If you need a refill on your cardiac medications before your next appointment, please call your pharmacy.

## 2016-02-08 NOTE — Progress Notes (Signed)
Clinical Summary Stacy Smith is a 70 y.o.female seen today for follow up of the following medical problems.   1. SOB/Diastolic HF - seen at [facility] 11/26/15 with SOB and LE edema - diagnosed with acute diastolic HF, diuresed during that admission.  - 11/2015 [facility] echo: LVEF 50-55%, abnormal diastolic function, moderate LAE, RVSP 80, normal RV size function is not reported.  - she reports weight 179 lbs prior to recent admision, today 166. Weight 07/2015 152 lbs.    - weight today 144 lbs, down from 166 lbs 11/2015. - denies any SOB. No recent LE edema. Occasional orthopnea.  - remains on lasix 40 mg daily.  2. CKD III - followed by nephrology   3. HTN - history of white coat HTN - checks daily at home but does not remember the numbers    SH: upcoming trip [place] Trip to [employer] for 1 month Past Medical History:  Diagnosis Date  . Anxiety   . Bradycardia   . CHF (congestive heart failure) ([place])   . Chronic kidney disease    CKD Stage 3  . Depression   . Diabetes mellitus without complication ([place])   . Hyperkalemia   . Hypertension   . Lupus ([place])   . Peripheral neuropathy ([place])   . Rheumatoid arthritis ([place])   . Seizures ([place])   . Shortness of breath dyspnea      Allergies  Allergen Reactions  . Latex      Current Outpatient Prescriptions  Medication Sig Dispense Refill  . albuterol (PROVENTIL) (2.5 MG/3ML) 0.083% nebulizer solution Take 2.5 mg by nebulization every 6 (six) hours as needed for wheezing or shortness of breath.    . carvedilol (COREG) 3.125 MG tablet Take 3.125 mg by mouth 2 (two) times daily with a meal.    . cloNIDine (CATAPRES) 0.2 MG tablet Take 0.2 mg by mouth daily.    Marland Kitchen docusate sodium (COLACE) 100 MG capsule Take 1 capsule (100 mg total) by mouth 2 (two) times daily. 10 capsule 0  . ferrous sulfate 325 (65 FE) MG tablet Take 325 mg by mouth daily.    . furosemide (LASIX) 40 MG tablet Take 40 mg by mouth daily.      Marland Kitchen HYDROcodone-acetaminophen (NORCO/VICODIN) 5-325 MG tablet Take 1 tablet by mouth as needed for moderate pain.    Marland Kitchen insulin detemir (LEVEMIR) 100 UNIT/ML injection Inject 80 Units into the skin at bedtime.    Marland Kitchen ipratropium-albuterol (DUONEB) 0.5-2.5 (3) MG/3ML SOLN Take 3 mLs by nebulization every 6 (six) hours as needed.    Marland Kitchen LORazepam (ATIVAN) 1 MG tablet Take 1 mg by mouth 3 (three) times daily.    Marland Kitchen omeprazole (PRILOSEC) 40 MG capsule Take 40 mg by mouth daily.    . ondansetron (ZOFRAN-ODT) 8 MG disintegrating tablet Take 8 mg by mouth 2 (two) times daily as needed for nausea or vomiting.     . potassium chloride SA (K-DUR,KLOR-CON) 20 MEQ tablet Take 20 mEq by mouth at bedtime.    . pravastatin (PRAVACHOL) 40 MG tablet Take 80 mg by mouth at bedtime.    . promethazine (PHENERGAN) 25 MG tablet Take 25 mg by mouth every 6 (six) hours as needed for nausea or vomiting.    . sertraline (ZOLOFT) 100 MG tablet Take 100 mg by mouth at bedtime.    Marland Kitchen tiZANidine (ZANAFLEX) 4 MG capsule Take 12 mg by mouth at bedtime.     No current facility-administered medications for  this visit.      Past Surgical History:  Procedure Laterality Date  . ABDOMINAL HYSTERECTOMY    . APPENDECTOMY    . CYST EXCISION    . DENTAL SURGERY  01/18/15     Allergies  Allergen Reactions  . Latex         Family History  Problem Relation Age of Onset  . Heart failure Mother   . Liver cancer Father   . Lung cancer Father   . Cancer Sister   . Heart murmur Brother       Social History Stacy Smith reports that she has never smoked. She has never used smokeless tobacco. Stacy Smith reports that she does not drink alcohol.   Review of Systems CONSTITUTIONAL: No weight loss, fever, chills, weakness or fatigue.  HEENT: Eyes: No visual loss, blurred vision, double vision or yellow sclerae.No hearing loss, sneezing, congestion, runny nose or sore throat.  SKIN: No rash or itching.  CARDIOVASCULAR: per  HPI RESPIRATORY: No shortness of breath, cough or sputum.  GASTROINTESTINAL: No anorexia, nausea, vomiting or diarrhea. No abdominal pain or blood.  GENITOURINARY: No burning on urination, no polyuria NEUROLOGICAL: No headache, dizziness, syncope, paralysis, ataxia, numbness or tingling in the extremities. No change in bowel or bladder control.  MUSCULOSKELETAL: No muscle, back pain, joint pain or stiffness.  LYMPHATICS: No enlarged nodes. No history of splenectomy.  PSYCHIATRIC: No history of depression or anxiety.  ENDOCRINOLOGIC: No reports of sweating, cold or heat intolerance. No polyuria or polydipsia.  Marland Kitchen   Physical Examination Vitals:   02/08/16 0911  BP: (!) 161/94  Pulse: 77   Vitals:   02/08/16 0911  Weight: 144 lb (65.3 kg)  Height: 5' (1.524 m)   Manual bp: 140/80  Gen: resting comfortably, no acute distress HEENT: no scleral icterus, pupils equal round and reactive, no palptable cervical adenopathy,  CV: RRR, no m/r/g/, no jvd Resp: Clear to auscultation bilaterally GI: abdomen is soft, non-tender, non-distended, normal bowel sounds, no hepatosplenomegaly MSK: extremities are warm, no edema.  Skin: warm, no rash Neuro:  no focal deficits Psych: appropriate affect     Assessment and Plan  1. HTN - history of white coat HTN. Manual bp 140/80. Given her CKD goal would be <130/80. She will keep bp log at home and call us Friday with update.   2. Chronic diastolic HF -significant diuresis and weight loss over the last few months - we will repeat labs, continue lasix 40mg  at this time however may lower dose at f/u pending weights and labs.   3. CKD III - follow renal function closely with diuresis, we will repeat labs today  4. Pulmonary HTN - repeat echo once euvolemic. Suspect related to her diastolic HF, she was severely volume overloaded at time of her echo when PASP was 80.  - repeat echo, she asks to be done 1st week of Nov   F/u 2  months    Arnoldo Lenis, M.D.

## 2016-02-24 ENCOUNTER — Telehealth: Payer: Self-pay | Admitting: *Deleted

## 2016-02-24 NOTE — Telephone Encounter (Signed)
Bp's are very up and down, unusual to see this degree of variability. We need to get more information. For the next 5 days can she check her bp in AM, afternoon, and evening and write down the times. Also write down when she takes her bp meds on those days.    Zandra Abts MD

## 2016-02-24 NOTE — Telephone Encounter (Signed)
Pt agreeable and voiced understanding of taking BP - pt will call back or bring by readings.

## 2016-02-24 NOTE — Telephone Encounter (Signed)
Pt dropped off BP log for the last week:  105/84 146/115 94/94 200/90 201/93 177/76 114/79

## 2016-03-16 ENCOUNTER — Ambulatory Visit (INDEPENDENT_AMBULATORY_CARE_PROVIDER_SITE_OTHER): Payer: Medicare HMO

## 2016-03-16 ENCOUNTER — Other Ambulatory Visit: Payer: Self-pay

## 2016-03-16 DIAGNOSIS — I5032 Chronic diastolic (congestive) heart failure: Secondary | ICD-10-CM | POA: Diagnosis not present

## 2016-03-23 ENCOUNTER — Telehealth: Payer: Self-pay | Admitting: *Deleted

## 2016-03-23 NOTE — Telephone Encounter (Signed)
Pt aware - 11/29 appt confirmed - routed to pcp

## 2016-03-23 NOTE — Telephone Encounter (Signed)
-----   Message from Arnoldo Lenis, MD sent at 03/21/2016  3:12 PM EST ----- Echo still shows some evidence of pulmonary hypertesnion, we will discuss further at our f/u, may need to consider additional testing.

## 2016-04-13 ENCOUNTER — Encounter: Payer: Self-pay | Admitting: *Deleted

## 2016-04-13 ENCOUNTER — Telehealth: Payer: Self-pay | Admitting: Cardiology

## 2016-04-13 ENCOUNTER — Ambulatory Visit (INDEPENDENT_AMBULATORY_CARE_PROVIDER_SITE_OTHER): Payer: Medicare HMO | Admitting: Cardiology

## 2016-04-13 VITALS — BP 182/105 | HR 59 | Ht 60.0 in | Wt 156.0 lb

## 2016-04-13 DIAGNOSIS — N183 Chronic kidney disease, stage 3 unspecified: Secondary | ICD-10-CM

## 2016-04-13 DIAGNOSIS — I10 Essential (primary) hypertension: Secondary | ICD-10-CM

## 2016-04-13 DIAGNOSIS — I5032 Chronic diastolic (congestive) heart failure: Secondary | ICD-10-CM

## 2016-04-13 DIAGNOSIS — I272 Pulmonary hypertension, unspecified: Secondary | ICD-10-CM | POA: Diagnosis not present

## 2016-04-13 MED ORDER — AMLODIPINE BESYLATE 5 MG PO TABS: 5.0000 mg | ORAL_TABLET | Freq: Every day | ORAL | 1 refills | Status: DC

## 2016-04-13 NOTE — Patient Instructions (Signed)
Medication Instructions:   Your physician has recommended you make the following change in your medication:   Start amlodipine 5 mg daily.  Continue all other medications the same.  Labwork: NONE  Testing/Procedures: Your physician has requested that you have a cardiac catheterization. Cardiac catheterization is used to diagnose and/or treat various heart conditions. Doctors may recommend this procedure for a number of different reasons. The most common reason is to evaluate chest pain. Chest pain can be a symptom of coronary artery disease (CAD), and cardiac catheterization can show whether plaque is narrowing or blocking your heart's arteries. This procedure is also used to evaluate the valves, as well as measure the blood flow and oxygen levels in different parts of your heart. For further information please visit [email or web address]. Please follow instruction sheet, as given.  Follow-Up:  Your physician recommends that you schedule a follow-up appointment in: 2 months.  Any Other Special Instructions Will Be Listed Below (If Applicable). Your physician has requested that you regularly monitor and record your blood pressure readings at home. Please use the same machine at the same time of day to check your readings and record them. Please call our office in one week with your results.  If you need a refill on your cardiac medications before your next appointment, please call your pharmacy.

## 2016-04-13 NOTE — Telephone Encounter (Signed)
Right Heart Cath dx: pulmonary htn Wednesday, May 18, 2016 @10 :00 am arrive at 8:00 am with Dr. Angelena Form

## 2016-04-13 NOTE — Progress Notes (Signed)
Clinical Summary Stacy Smith is a 70 y.o.female seen today for follow up of the following medical problems.   1. SOB/Diastolic HF - seen at [facility] 11/26/15 with SOB and LE edema - diagnosed with acute diastolic HF, diuresed during that admission.  - 11/2015 [facility] echo: LVEF 50-55%, abnormal diastolic function, moderate LAE, RVSP 80, normal RV size function is not reported.   - denies any SOB. No recent LE edema. Occasional orthopnea.  - remains on lasix 40 mg daily.  2. CKD III - followed by nephrology  3. HTN - history of white coat HTN - home bp's around 170s/100s.    4. Cirrhosis - reports recent US recent diagnosis, states she is undergoing further workup.        Past Medical History:  Diagnosis Date  . Anxiety   . Bradycardia   . CHF (congestive heart failure) ([place])   . Chronic kidney disease    CKD Stage 3  . Depression   . Diabetes mellitus without complication ([place])   . Hyperkalemia   . Hypertension   . Lupus   . Peripheral neuropathy ([place])   . Rheumatoid arthritis ([employer])   . Seizures ([place])   . Shortness of breath dyspnea      Allergies  Allergen Reactions  . Latex      Current Outpatient Prescriptions  Medication Sig Dispense Refill  . albuterol (PROVENTIL) (2.5 MG/3ML) 0.083% nebulizer solution Take 2.5 mg by nebulization every 6 (six) hours as needed for wheezing or shortness of breath.    . carvedilol (COREG) 3.125 MG tablet Take 3.125 mg by mouth 2 (two) times daily with a meal.    . cloNIDine (CATAPRES) 0.2 MG tablet Take 0.2 mg by mouth daily.    Marland Kitchen docusate sodium (COLACE) 100 MG capsule Take 1 capsule (100 mg total) by mouth 2 (two) times daily. 10 capsule 0  . ferrous sulfate 325 (65 FE) MG tablet Take 325 mg by mouth daily.    . furosemide (LASIX) 40 MG tablet Take 40 mg by mouth daily.    Marland Kitchen HYDROcodone-acetaminophen (NORCO/VICODIN) 5-325 MG tablet Take 1 tablet by mouth as needed for moderate pain.    Marland Kitchen insulin  detemir (LEVEMIR) 100 UNIT/ML injection Inject 80 Units into the skin at bedtime.    Marland Kitchen ipratropium-albuterol (DUONEB) 0.5-2.5 (3) MG/3ML SOLN Take 3 mLs by nebulization every 6 (six) hours as needed.    Marland Kitchen LORazepam (ATIVAN) 1 MG tablet Take 1 mg by mouth 3 (three) times daily.    Marland Kitchen omeprazole (PRILOSEC) 40 MG capsule Take 40 mg by mouth daily.    . ondansetron (ZOFRAN-ODT) 8 MG disintegrating tablet Take 8 mg by mouth 2 (two) times daily as needed for nausea or vomiting.     . potassium chloride SA (K-DUR,KLOR-CON) 20 MEQ tablet Take 20 mEq by mouth at bedtime.    . pravastatin (PRAVACHOL) 40 MG tablet Take 80 mg by mouth at bedtime.    . promethazine (PHENERGAN) 25 MG tablet Take 25 mg by mouth every 6 (six) hours as needed for nausea or vomiting.    . sertraline (ZOLOFT) 100 MG tablet Take 100 mg by mouth at bedtime.    Marland Kitchen tiZANidine (ZANAFLEX) 4 MG capsule Take 12 mg by mouth at bedtime.     No current facility-administered medications for this visit.      Past Surgical History:  Procedure Laterality Date  . ABDOMINAL HYSTERECTOMY    . APPENDECTOMY    .  CYST EXCISION    . DENTAL SURGERY  01/18/15     Allergies  Allergen Reactions  . Latex       Family History  Problem Relation Age of Onset  . Heart failure Mother   . Liver cancer Father   . Lung cancer Father   . Cancer Sister   . Heart murmur Brother      Social History Ms. Hilmes reports that she has never smoked. She has never used smokeless tobacco. Ms. Gauger reports that she does not drink alcohol.   Review of Systems CONSTITUTIONAL: No weight loss, fever, chills, weakness or fatigue.  HEENT: Eyes: No visual loss, blurred vision, double vision or yellow sclerae.No hearing loss, sneezing, congestion, runny nose or sore throat.  SKIN: No rash or itching.  CARDIOVASCULAR: no chest pain, no palpitaitons.  RESPIRATORY: No shortness of breath, cough or sputum.  GASTROINTESTINAL: No anorexia, nausea, vomiting or  diarrhea. No abdominal pain or blood.  GENITOURINARY: No burning on urination, no polyuria NEUROLOGICAL: No headache, dizziness, syncope, paralysis, ataxia, numbness or tingling in the extremities. No change in bowel or bladder control.  MUSCULOSKELETAL: No muscle, back pain, joint pain or stiffness.  LYMPHATICS: No enlarged nodes. No history of splenectomy.  PSYCHIATRIC: No history of depression or anxiety.  ENDOCRINOLOGIC: No reports of sweating, cold or heat intolerance. No polyuria or polydipsia.  Marland Kitchen   Physical Examination Vitals:   04/13/16 1349 04/13/16 1413  BP: (!) 176/109 (!) 182/105  Pulse: 64 (!) 59   Vitals:   04/13/16 1349  Weight: 156 lb (70.8 kg)  Height: 5' (1.524 m)    Gen: resting comfortably, no acute distress HEENT: no scleral icterus, pupils equal round and reactive, no palptable cervical adenopathy,  CV: RRR, no m/r/g, no jvd Resp: Clear to auscultation bilaterally GI: abdomen is soft, non-tender, non-distended, normal bowel sounds, no hepatosplenomegaly MSK: extremities are warm, no edema.  Skin: warm, no rash Neuro:  no focal deficits Psych: appropriate affect   Diagnostic Studies 03/2016 echo Study Conclusions  - Left ventricle: The cavity size was at the upper limits of   normal. Wall thickness was increased in a pattern of mild LVH.   The estimated ejection fraction was 50%. Diffuse hypokinesis.   Although no diagnostic regional wall motion abnormality was   identified, this possibility cannot be completely excluded on the   basis of this study. Features are consistent with a pseudonormal   left ventricular filling pattern, with concomitant abnormal   relaxation and increased filling pressure (grade 2 diastolic   dysfunction). - Aortic valve: Mildly calcified annulus. Trileaflet; mildly   calcified leaflets. - Mitral valve: Calcified annulus. There was moderate regurgitation   directed eccentrically. - Left atrium: The atrium was severely  dilated. - Right ventricle: Systolic function was mildly reduced. - Right atrium: The atrium was mildly dilated. Central venous   pressure (est): 8 mm Hg. - Tricuspid valve: There was mild regurgitation. - Pulmonary arteries: Systolic pressure was severely increased. PA   peak pressure: 83 mm Hg (S). - Pericardium, extracardiac: There was no pericardial effusion.  Impressions:  - Unable to compare with recent outside echocardiogram. Upper   normal LV chamber size with mild LVH and LVEF approximately 50%.   Grade 2 diastolic dysfunction. Severe left atrial enlargement.   Mild mitral annular calcification with moderate, eccentric mitral   regurgitation. Mildly calcified aortic valve. Mildly reduced RV   contraction. Mild tricuspid regurgitation with evidence of severe   pulmonary hypertension, PASP  estimated 83 mmHg. Mild right atrial   enlargement.    Assessment and Plan  1. HTN - history of white coat HTN, however home numbers are elevated - start norvasc 5mg  dialy   2. Chronic diastolic HF -significant diuresis and weight loss over the last few months - continue current meds  3. CKD III - follow renal function closely with diuresis  4. Pulmonary HTN - severely elevated PASP by echo, even after significnat diuresis - plan for RHC to better define pulm pressures and potential etiology. She requests to have done after the New Year.    F/u 2 months      Arnoldo Lenis, M.D., F.A.C.C.

## 2016-04-15 ENCOUNTER — Encounter: Payer: Self-pay | Admitting: Cardiology

## 2016-05-10 ENCOUNTER — Other Ambulatory Visit: Payer: Self-pay | Admitting: Cardiology

## 2016-05-10 DIAGNOSIS — I272 Pulmonary hypertension, unspecified: Secondary | ICD-10-CM

## 2016-05-18 ENCOUNTER — Encounter (HOSPITAL_COMMUNITY): Payer: Self-pay | Admitting: Cardiovascular Disease

## 2016-05-18 ENCOUNTER — Encounter (HOSPITAL_COMMUNITY)
Admission: RE | Disposition: A | Discharge status: Home / Self Care | Payer: Self-pay | Source: Ambulatory Visit | Attending: Cardiovascular Disease

## 2016-05-18 ENCOUNTER — Ambulatory Visit (HOSPITAL_COMMUNITY)
Admission: RE | Admit: 2016-05-18 | Discharge: 2016-05-18 | Disposition: A | Discharge status: Home / Self Care | Payer: Medicare HMO | Source: Ambulatory Visit | Attending: Cardiovascular Disease | Admitting: Cardiovascular Disease

## 2016-05-18 DIAGNOSIS — Z91040 Latex allergy status: Secondary | ICD-10-CM | POA: Diagnosis not present

## 2016-05-18 DIAGNOSIS — I272 Pulmonary hypertension, unspecified: Secondary | ICD-10-CM | POA: Insufficient documentation

## 2016-05-18 DIAGNOSIS — E875 Hyperkalemia: Secondary | ICD-10-CM | POA: Insufficient documentation

## 2016-05-18 DIAGNOSIS — Z794 Long term (current) use of insulin: Secondary | ICD-10-CM | POA: Diagnosis not present

## 2016-05-18 DIAGNOSIS — Z8249 Family history of ischemic heart disease and other diseases of the circulatory system: Secondary | ICD-10-CM | POA: Insufficient documentation

## 2016-05-18 DIAGNOSIS — E1142 Type 2 diabetes mellitus with diabetic polyneuropathy: Secondary | ICD-10-CM | POA: Insufficient documentation

## 2016-05-18 DIAGNOSIS — F329 Major depressive disorder, single episode, unspecified: Secondary | ICD-10-CM | POA: Diagnosis not present

## 2016-05-18 DIAGNOSIS — M069 Rheumatoid arthritis, unspecified: Secondary | ICD-10-CM | POA: Diagnosis not present

## 2016-05-18 DIAGNOSIS — N183 Chronic kidney disease, stage 3 (moderate): Secondary | ICD-10-CM | POA: Diagnosis not present

## 2016-05-18 DIAGNOSIS — I5032 Chronic diastolic (congestive) heart failure: Secondary | ICD-10-CM | POA: Diagnosis not present

## 2016-05-18 DIAGNOSIS — E1122 Type 2 diabetes mellitus with diabetic chronic kidney disease: Secondary | ICD-10-CM | POA: Insufficient documentation

## 2016-05-18 DIAGNOSIS — Z808 Family history of malignant neoplasm of other organs or systems: Secondary | ICD-10-CM | POA: Diagnosis not present

## 2016-05-18 DIAGNOSIS — F419 Anxiety disorder, unspecified: Secondary | ICD-10-CM | POA: Insufficient documentation

## 2016-05-18 DIAGNOSIS — Z801 Family history of malignant neoplasm of trachea, bronchus and lung: Secondary | ICD-10-CM | POA: Insufficient documentation

## 2016-05-18 DIAGNOSIS — M329 Systemic lupus erythematosus, unspecified: Secondary | ICD-10-CM | POA: Insufficient documentation

## 2016-05-18 DIAGNOSIS — I130 Hypertensive heart and chronic kidney disease with heart failure and stage 1 through stage 4 chronic kidney disease, or unspecified chronic kidney disease: Secondary | ICD-10-CM | POA: Diagnosis not present

## 2016-05-18 DIAGNOSIS — R569 Unspecified convulsions: Secondary | ICD-10-CM | POA: Diagnosis not present

## 2016-05-18 HISTORY — PX: CARDIAC CATHETERIZATION: SHX172

## 2016-05-18 LAB — CBC
HCT: 31.2 % — ABNORMAL LOW (ref 36.0–46.0)
Hemoglobin: 10.9 g/dL — ABNORMAL LOW (ref 12.0–15.0)
MCH: 27.9 pg (ref 26.0–34.0)
MCHC: 34.9 g/dL (ref 30.0–36.0)
MCV: 80 fL (ref 78.0–100.0)
PLATELETS: 201 10*3/uL (ref 150–400)
RBC: 3.9 MIL/uL (ref 3.87–5.11)
RDW: 13.6 % (ref 11.5–15.5)
WBC: 7.9 10*3/uL (ref 4.0–10.5)

## 2016-05-18 LAB — BASIC METABOLIC PANEL
Anion gap: 7 (ref 5–15)
BUN: 29 mg/dL — AB (ref 6–20)
CO2: 22 mmol/L (ref 22–32)
CREATININE: 1.97 mg/dL — AB (ref 0.44–1.00)
Calcium: 9.1 mg/dL (ref 8.9–10.3)
Chloride: 107 mmol/L (ref 101–111)
GFR calc Af Amer: 28 mL/min — ABNORMAL LOW (ref >60)
GFR, EST NON AFRICAN AMERICAN: 25 mL/min — AB (ref >60)
Glucose, Bld: 58 mg/dL — ABNORMAL LOW (ref 65–99)
Potassium: 4.7 mmol/L (ref 3.5–5.1)
SODIUM: 136 mmol/L (ref 135–145)

## 2016-05-18 LAB — POCT I-STAT 3, VENOUS BLOOD GAS (G3P V)
ACID-BASE DEFICIT: 4 mmol/L — AB (ref 0.0–2.0)
Acid-base deficit: 4 mmol/L — ABNORMAL HIGH (ref 0.0–2.0)
Bicarbonate: 21.5 mmol/L (ref 20.0–28.0)
Bicarbonate: 20.8 mmol/L (ref 20.0–28.0)
O2 SAT: 72 %
O2 Saturation: 73 %
PCO2 VEN: 38.1 mmHg — AB (ref 44.0–60.0)
PH VEN: 7.344 (ref 7.250–7.430)
PO2 VEN: 41 mmHg (ref 32.0–45.0)
TCO2: 22 mmol/L (ref 0–100)
TCO2: 23 mmol/L (ref 0–100)
pCO2, Ven: 39.5 mmHg — ABNORMAL LOW (ref 44.0–60.0)
pH, Ven: 7.345 (ref 7.250–7.430)
pO2, Ven: 40 mmHg (ref 32.0–45.0)

## 2016-05-18 LAB — POCT I-STAT, CHEM 8
BUN: 29 mg/dL — ABNORMAL HIGH (ref 6–20)
Calcium, Ion: 1.24 mmol/L (ref 1.15–1.40)
Chloride: 107 mmol/L (ref 101–111)
Creatinine, Ser: 1.8 mg/dL — ABNORMAL HIGH (ref 0.44–1.00)
Glucose, Bld: 49 mg/dL — ABNORMAL LOW (ref 65–99)
HEMATOCRIT: 31 % — AB (ref 36.0–46.0)
HEMOGLOBIN: 10.5 g/dL — AB (ref 12.0–15.0)
Potassium: 3.7 mmol/L (ref 3.5–5.1)
SODIUM: 141 mmol/L (ref 135–145)
TCO2: 22 mmol/L (ref 0–100)

## 2016-05-18 LAB — GLUCOSE, CAPILLARY
GLUCOSE-CAPILLARY: 46 mg/dL — AB (ref 65–99)
GLUCOSE-CAPILLARY: 100 mg/dL — AB (ref 65–99)
Glucose-Capillary: 86 mg/dL (ref 65–99)
Glucose-Capillary: 272 mg/dL — ABNORMAL HIGH (ref 65–99)

## 2016-05-18 LAB — PROTIME-INR
INR: 1.04
Prothrombin Time: 13.6 seconds (ref 11.4–15.2)

## 2016-05-18 SURGERY — RIGHT HEART CATH
Anesthesia: LOCAL

## 2016-05-18 MED ORDER — SODIUM CHLORIDE 0.9% FLUSH: 3.0000 mL | Freq: Two times a day (BID) | INTRAVENOUS | Status: DC

## 2016-05-18 MED ORDER — DEXTROSE-NACL 5-0.45 % IV SOLN
Freq: Once | INTRAVENOUS | Status: AC
  Administered 2016-05-18: 09:00:00 via INTRAVENOUS

## 2016-05-18 MED ORDER — HEPARIN (PORCINE) IN NACL 2-0.9 UNIT/ML-% IJ SOLN
INTRAMUSCULAR | Status: AC
  Filled 2016-05-18: qty 500

## 2016-05-18 MED ORDER — HEPARIN (PORCINE) IN NACL 2-0.9 UNIT/ML-% IJ SOLN
INTRAMUSCULAR | Status: DC | PRN
  Administered 2016-05-18: 500 mL

## 2016-05-18 MED ORDER — INSULIN ASPART 100 UNIT/ML ~~LOC~~ SOLN
0.0000 [IU] | Freq: Three times a day (TID) | SUBCUTANEOUS | Status: DC
  Filled 2016-05-18: qty 0.15

## 2016-05-18 MED ORDER — SODIUM CHLORIDE 0.9 % IV SOLN: INTRAVENOUS | Status: AC

## 2016-05-18 MED ORDER — LIDOCAINE HCL (PF) 1 % IJ SOLN
INTRAMUSCULAR | Status: DC | PRN
  Administered 2016-05-18: 1 mL

## 2016-05-18 MED ORDER — LIDOCAINE HCL (PF) 1 % IJ SOLN
INTRAMUSCULAR | Status: AC
  Filled 2016-05-18: qty 30

## 2016-05-18 MED ORDER — DEXTROSE 50 % IV SOLN
INTRAVENOUS | Status: DC | PRN
  Administered 2016-05-18: .5 via INTRAVENOUS

## 2016-05-18 MED ORDER — SODIUM CHLORIDE 0.9 % IV SOLN: 250.0000 mL | INTRAVENOUS | Status: DC | PRN

## 2016-05-18 MED ORDER — SODIUM CHLORIDE 0.9 % IV SOLN
INTRAVENOUS | Status: DC
  Administered 2016-05-18: 09:00:00 via INTRAVENOUS

## 2016-05-18 MED ORDER — SODIUM CHLORIDE 0.9% FLUSH: 3.0000 mL | INTRAVENOUS | Status: DC | PRN

## 2016-05-18 MED ORDER — MIDAZOLAM HCL 2 MG/2ML IJ SOLN
INTRAMUSCULAR | Status: AC
  Filled 2016-05-18: qty 2

## 2016-05-18 MED ORDER — DEXTROSE 50 % IV SOLN
INTRAVENOUS | Status: AC
  Administered 2016-05-18: 25 mL
  Filled 2016-05-18: qty 50

## 2016-05-18 SURGICAL SUPPLY — 7 items
CATH BALLN WEDGE 5F 110CM (CATHETERS) ×2 IMPLANT
GUIDEWIRE .025 260CM (WIRE) ×2 IMPLANT
PACK CARDIAC CATHETERIZATION (CUSTOM PROCEDURE TRAY) ×2 IMPLANT
SHEATH FAST CATH BRACH 5F 5CM (SHEATH) ×2 IMPLANT
TRANSDUCER W/STOPCOCK (MISCELLANEOUS) ×2 IMPLANT
TUBING ART PRESS 72  MALE/FEM (TUBING) ×1
TUBING ART PRESS 72 MALE/FEM (TUBING) ×1 IMPLANT

## 2016-05-18 NOTE — Discharge Instructions (Signed)
Angiogram, Care After Refer to this sheet in the next few weeks. These instructions provide you with information about caring for yourself after your procedure. Your health care provider may also give you more specific instructions. Your treatment has been planned according to current medical practices, but problems sometimes occur. Call your health care provider if you have any problems or questions after your procedure. What can I expect after the procedure? Follow these instructions at home:  Take medicines only as directed by your health care provider.  You may shower 24 hours after the procedure or as directed by your health care provider. Remove the bandage (dressing) and gently wash the site with plain soap and water. Pat the area dry with a clean towel. Do not rub the site, because this may cause bleeding.  Do not drive home if you are discharged the same day as the procedure. Have someone else drive you.  You may drive 24 hours after the procedure unless otherwise instructed by your health care provider.  Do not operate machinery or power tools for 24 hours after the procedure or as directed by your health care provider.  If your procedure was done as an outpatient procedure, which means that you went home the same day as your procedure, a responsible adult should be with you for the first 24 hours after you arrive home.  Keep all follow-up visits as directed by your health care provider. This is important. Contact a health care provider if:  You have a fever.  You have chills.  You have increased bleeding from the catheter insertion site. Hold pressure on the site. Get help right away if:  You have unusual pain at the catheter insertion site.  You have redness, warmth, or swelling at the catheter insertion site.  You have drainage (other than a small amount of blood on the dressing) from the catheter insertion site.  The catheter insertion site is bleeding, and the bleeding  does not stop after 30 minutes of holding steady pressure on the site.  The area near or just beyond the catheter insertion site becomes pale, cool, tingly, or numb. This information is not intended to replace advice given to you by your health care provider. Make sure you discuss any questions you have with your health care provider. Document Released: 11/18/2004 Document Revised: 10/08/2015 Document Reviewed: 10/03/2012 Elsevier Interactive Patient Education  2017 [employer].

## 2016-05-18 NOTE — H&P (Signed)
Patient ID: Stacy Smith MRN: [id number] DOB/AGE: 02/10/1946 71 y.o. Admit date: 05/18/2016  Primary Care Physician: Jacqualine Code, DO Primary Cardiologist: Harl Bowie  HPI: 71 yo female with history of diastolic CHF, dyspnea, CKD III, HTN with echo findings of pulm HTN. Right heart cath today to assess PA pressures.   Review of systems complete and found to be negative unless listed above   Past Medical History:  Diagnosis Date  . Anxiety   . Bradycardia   . CHF (congestive heart failure) ([place])   . Chronic kidney disease    CKD Stage 3  . Depression   . Diabetes mellitus without complication ([place])   . Hyperkalemia   . Hypertension   . Lupus   . Peripheral neuropathy ([place])   . Rheumatoid arthritis ([place])   . Seizures ([place])   . Shortness of breath dyspnea     Family History  Problem Relation Age of Onset  . Heart failure Mother   . Liver cancer Father   . Lung cancer Father   . Cancer Sister   . Heart murmur Brother     Social History   Social History  . Marital status: Single    Spouse name: N/A  . Number of children: N/A  . Years of education: N/A   Occupational History  . Not on file.   Social History Main Topics  . Smoking status: Never Smoker  . Smokeless tobacco: Never Used  . Alcohol use No  . Drug use: No  . Sexual activity: Not on file   Other Topics Concern  . Not on file   Social History Narrative  . No narrative on file    Past Surgical History:  Procedure Laterality Date  . ABDOMINAL HYSTERECTOMY    . APPENDECTOMY    . CYST EXCISION    . DENTAL SURGERY  01/18/15    Allergies  Allergen Reactions  . Latex Swelling    Prior to Admission Meds:  Prior to Admission medications   Medication Sig Start Date End Date Taking? Authorizing Provider  amLODipine (NORVASC) 5 MG tablet Take 1 tablet (5 mg total) by mouth daily. 04/13/16 07/12/16  Arnoldo Lenis, MD  carvedilol (COREG) 3.125 MG tablet Take 3.125 mg by mouth 2 (two) times  daily with a meal.    Historical Provider, MD  cloNIDine (CATAPRES) 0.2 MG tablet Take 0.2 mg by mouth daily.    Historical Provider, MD  docusate sodium (COLACE) 100 MG capsule Take 1 capsule (100 mg total) by mouth 2 (two) times daily. 01/28/15   Donne Hazel, MD  ferrous sulfate 325 (65 FE) MG tablet Take 325 mg by mouth daily.    Historical Provider, MD  furosemide (LASIX) 40 MG tablet Take 40 mg by mouth daily.    Historical Provider, MD  HYDROcodone-acetaminophen (NORCO/VICODIN) 5-325 MG tablet Take 1 tablet by mouth as needed for moderate pain.    Historical Provider, MD  insulin detemir (LEVEMIR) 100 UNIT/ML injection Inject 80 Units into the skin at bedtime.    Historical Provider, MD  ipratropium-albuterol (DUONEB) 0.5-2.5 (3) MG/3ML SOLN Take 3 mLs by nebulization every 6 (six) hours as needed.    Historical Provider, MD  LORazepam (ATIVAN) 1 MG tablet Take 1 mg by mouth 3 (three) times daily. 01/13/15   Historical Provider, MD  metoCLOPramide (REGLAN) 10 MG tablet Take 1 tablet by mouth 2 (two) times daily. 03/23/16   Historical Provider, MD  omeprazole (PRILOSEC) 40 MG capsule  Take 40 mg by mouth daily. 01/11/15   Historical Provider, MD  ondansetron (ZOFRAN-ODT) 8 MG disintegrating tablet Take 8 mg by mouth 2 (two) times daily as needed for nausea or vomiting.  11/14/14   Historical Provider, MD  potassium chloride SA (K-DUR,KLOR-CON) 20 MEQ tablet Take 40 mEq by mouth daily.     Historical Provider, MD  pravastatin (PRAVACHOL) 40 MG tablet Take 80 mg by mouth at bedtime.    Historical Provider, MD  promethazine (PHENERGAN) 25 MG tablet Take 25 mg by mouth every 6 (six) hours as needed for nausea or vomiting.    Historical Provider, MD  sertraline (ZOLOFT) 100 MG tablet Take 100 mg by mouth at bedtime.    Historical Provider, MD  tiZANidine (ZANAFLEX) 4 MG capsule Take 12 mg by mouth at bedtime.    Historical Provider, MD  Vitamin D, Ergocalciferol, (DRISDOL) 50000 units CAPS capsule Take  1 capsule by mouth once a week. 03/23/16   Historical Provider, MD    Physical Exam: Blood pressure (!) 188/70, pulse (!) 54, temperature 98 F (36.7 C), temperature source Oral, resp. rate 18, height 5' (1.524 m), weight 150 lb (68 kg), SpO2 97 %.    General: Well developed, well nourished, NAD  HEENT: OP clear, mucus membranes moist  SKIN: warm, dry. No rashes.  Neuro: No focal deficits  Musculoskeletal: Muscle strength 5/5 all ext  Psychiatric: Mood and affect normal  Neck: No JVD, no carotid bruits, no thyromegaly, no lymphadenopathy.  Lungs:Clear bilaterally, no wheezes, rhonci, crackles  Cardiovascular: Regular rate and rhythm. No murmurs, gallops or rubs.  Abdomen:Soft. Bowel sounds present. Non-tender.  Extremities: No lower extremity edema. Pulses are 2 + in the bilateral DP/PT.  ASSESSMENT AND PLAN:   1. SOB/Diastolic CHF: She has been treated for diastolic CHF with Lasix. Echo July 2017 with LVEF=50-55%, RVSP 80 mmHg. Right heart cath today to assess PA pressures.   Darlina Guys, MD 05/18/2016, 8:25 AM

## 2016-05-18 NOTE — Interval H&P Note (Signed)
History and Physical Interval Note:  05/18/2016 9:54 AM  Stacy Smith  has presented today for cardiac cath with the diagnosis of pulmonary hypertension  The various methods of treatment have been discussed with the patient and family. After consideration of risks, benefits and other options for treatment, the patient has consented to  Procedure(s): Right Heart Cath (N/A) as a surgical intervention .  The patient's history has been reviewed, patient examined, no change in status, stable for surgery.  I have reviewed the patient's chart and labs.  Questions were answered to the patient's satisfaction.     Lauree Chandler

## 2016-06-15 ENCOUNTER — Encounter: Payer: Medicare HMO | Admitting: Cardiology

## 2017-03-10 ENCOUNTER — Ambulatory Visit: Payer: Medicare HMO | Admitting: Cardiology

## 2017-03-27 ENCOUNTER — Ambulatory Visit: Payer: Medicare HMO | Admitting: Cardiology

## 2017-05-22 ENCOUNTER — Encounter: Payer: Self-pay | Admitting: Cardiology

## 2017-05-22 ENCOUNTER — Encounter: Payer: Self-pay | Admitting: *Deleted

## 2017-05-22 ENCOUNTER — Other Ambulatory Visit: Payer: Self-pay

## 2017-05-22 ENCOUNTER — Ambulatory Visit: Payer: Medicare HMO | Admitting: Cardiology

## 2017-05-22 VITALS — BP 179/71 | HR 83 | Ht 60.0 in | Wt 170.0 lb

## 2017-05-22 DIAGNOSIS — I272 Pulmonary hypertension, unspecified: Secondary | ICD-10-CM

## 2017-05-22 NOTE — Patient Instructions (Signed)
Medication Instructions:  Continue all current medications.  Labwork: none  Testing/Procedures: none  Follow-Up: Your physician wants you to follow up in: 6 months.  You will receive a reminder letter in the mail one-two months in advance.  If you don't receive a letter, please call our office to schedule the follow up appointment   Any Other Special Instructions Will Be Listed Below (If Applicable). You have been referred to:  Dr. Aundra Dubin - heart failure clinic   If you need a refill on your cardiac medications before your next appointment, please call your pharmacy.

## 2017-05-22 NOTE — Progress Notes (Signed)
Clinical Summary Stacy Smith is a 72 y.o.female today for follow up of the following medical problems.   1. SOB/Diastolic HF - seen at [facility] 11/26/15 with SOB and LE edema - diagnosed with acute diastolic HF, diuresed during that admission.  - 11/2015 [place] echo: LVEF 50-55%, abnormal diastolic function, moderate LAE,RVSP 80, normal RV size function is not reported.   - occasioanl SOB. DOE at 1/2 block - she reports diuretics managed by Dr Iona Beard.   2. CKD III - followed by nephrology  3. HTN - history of white coat HTN   - home bp's around 140s/70s .- she had had some changes to her regimen since our last visit over a year ago, the details and indications are unclear at this time. Requesting records.   4. Cirrhosis? - reports recent US recent diagnosis, states she is undergoing further workup.   5. Pulmonary HTN - mean PA 45, PCWP 18, CI 3.59 - she did not follow up after her RHC due to ongoing other health issues, and dealing with her husbands recent passing.   6. Anemia - followed by hematology  7. CKD  - followed by Dr Iona Beard and urology.    Husband Stacy Smith passed Jan 2018, he was also a patient of mine    Past Medical History:  Diagnosis Date  . Anxiety   . Bradycardia   . CHF (congestive heart failure) ([place])   . Chronic kidney disease    CKD Stage 3  . Depression   . Diabetes mellitus without complication ([place])   . Hyperkalemia   . Hypertension   . Lupus   . Peripheral neuropathy   . Rheumatoid arthritis ([place])   . Seizures ([place])   . Shortness of breath dyspnea      Allergies  Allergen Reactions  . Latex Swelling     Current Outpatient Medications  Medication Sig Dispense Refill  . amLODipine (NORVASC) 5 MG tablet Take 5 mg by mouth daily.    Marland Kitchen aspirin EC 81 MG tablet Take 81 mg by mouth daily.    . calcitRIOL (ROCALTROL) 0.5 MCG capsule Take 0.5 mcg by mouth daily.    . cloNIDine (CATAPRES) 0.2 MG tablet Take 0.2  mg by mouth 3 (three) times daily.     Marland Kitchen docusate sodium (COLACE) 100 MG capsule Take 1 capsule (100 mg total) by mouth 2 (two) times daily. 10 capsule 0  . ferrous sulfate 325 (65 FE) MG tablet Take 325 mg by mouth daily with breakfast.    . furosemide (LASIX) 20 MG tablet Take 20 mg by mouth every Monday, Wednesday, and Friday.     Marland Kitchen glucose 4 GM chewable tablet Chew 1 tablet by mouth as needed for low blood sugar.    . insulin detemir (LEVEMIR) 100 UNIT/ML injection Inject 80 Units into the skin 2 (two) times daily.     Marland Kitchen ipratropium-albuterol (DUONEB) 0.5-2.5 (3) MG/3ML SOLN Take 3 mLs by nebulization every 6 (six) hours as needed (for shortness of breath).     . ondansetron (ZOFRAN-ODT) 8 MG disintegrating tablet Take 8 mg by mouth at bedtime.     Marland Kitchen oxyCODONE (OXY IR/ROXICODONE) 5 MG immediate release tablet Take 5 mg by mouth every 6 (six) hours as needed for severe pain.    . pravastatin (PRAVACHOL) 80 MG tablet Take 80 mg by mouth at bedtime.    . sertraline (ZOLOFT) 100 MG tablet Take 100 mg by mouth at bedtime.    Marland Kitchen  torsemide (DEMADEX) 5 MG tablet Take 5 mg by mouth daily.    . traZODone (DESYREL) 50 MG tablet Take 50 mg by mouth at bedtime.    . Vitamin D, Ergocalciferol, (DRISDOL) 50000 units CAPS capsule Take 50,000 Units by mouth once a week.   11   No current facility-administered medications for this visit.      Past Surgical History:  Procedure Laterality Date  . ABDOMINAL HYSTERECTOMY    . APPENDECTOMY    . CARDIAC CATHETERIZATION N/A 05/18/2016   Procedure: Right Heart Cath;  Surgeon: Burnell Blanks, MD;  Location: [place] [facility];  Service: Cardiovascular;  Laterality: N/A;  . CYST EXCISION    . DENTAL SURGERY  01/18/15     Allergies  Allergen Reactions  . Latex Swelling      Family History  Problem Relation Age of Onset  . Heart failure Mother   . Liver cancer Father   . Lung cancer Father   . Cancer Sister   . Heart murmur Brother       Social History Stacy Smith reports that  has never smoked. she has never used smokeless tobacco. Stacy Smith reports that she does not drink alcohol.   Review of Systems CONSTITUTIONAL: No weight loss, fever, chills, weakness or fatigue.  HEENT: Eyes: No visual loss, blurred vision, double vision or yellow sclerae.No hearing loss, sneezing, congestion, runny nose or sore throat.  SKIN: No rash or itching.  CARDIOVASCULAR: per hpi RESPIRATORY: per hpi GASTROINTESTINAL: No anorexia, nausea, vomiting or diarrhea. No abdominal pain or blood.  GENITOURINARY: No burning on urination, no polyuria NEUROLOGICAL: No headache, dizziness, syncope, paralysis, ataxia, numbness or tingling in the extremities. No change in bowel or bladder control.  MUSCULOSKELETAL: No muscle, back pain, joint pain or stiffness.  LYMPHATICS: No enlarged nodes. No history of splenectomy.  PSYCHIATRIC: No history of depression or anxiety.  ENDOCRINOLOGIC: No reports of sweating, cold or heat intolerance. No polyuria or polydipsia.  Marland Kitchen   Physical Examination Vitals:   05/22/17 1427  BP: (!) 179/71  Pulse: 83  SpO2: 92%   Filed Weights   05/22/17 1427  Weight: 170 lb (77.1 kg)    Gen: resting comfortably, no acute distress HEENT: no scleral icterus, pupils equal round and reactive, no palptable cervical adenopathy,  CV: RRR, no m/r/g, no jvd Resp: Clear to auscultation bilaterally GI: abdomen is soft, non-tender, non-distended, normal bowel sounds, no hepatosplenomegaly MSK: extremities are warm, 1+ bilateral edema Skin: warm, no rash Neuro:  no focal deficits Psych: appropriate affect   Diagnostic Studies 03/2016 echo Study Conclusions  - Left ventricle: The cavity size was at the upper limits of normal. Wall thickness was increased in a pattern of mild LVH. The estimated ejection fraction was 50%. Diffuse hypokinesis. Although no diagnostic regional wall motion abnormality was identified,  this possibility cannot be completely excluded on the basis of this study. Features are consistent with a pseudonormal left ventricular filling pattern, with concomitant abnormal relaxation and increased filling pressure (grade 2 diastolic dysfunction). - Aortic valve: Mildly calcified annulus. Trileaflet; mildly calcified leaflets. - Mitral valve: Calcified annulus. There was moderate regurgitation directed eccentrically. - Left atrium: The atrium was severely dilated. - Right ventricle: Systolic function was mildly reduced. - Right atrium: The atrium was mildly dilated. Central venous pressure (est): 8 mm Hg. - Tricuspid valve: There was mild regurgitation. - Pulmonary arteries: Systolic pressure was severely increased. PA peak pressure: 83 mm Hg (S). - Pericardium, extracardiac: There was  no pericardial effusion.  Impressions:  - Unable to compare with recent outside echocardiogram. Upper normal LV chamber size with mild LVH and LVEF approximately 50%. Grade 2 diastolic dysfunction. Severe left atrial enlargement. Mild mitral annular calcification with moderate, eccentric mitral regurgitation. Mildly calcified aortic valve. Mildly reduced RV contraction. Mild tricuspid regurgitation with evidence of severe pulmonary hypertension, PASP estimated 83 mmHg. Mild right atrial enlargement.   Jan 2018 RHC 1. Moderate to severe pulmonary HTN  Recommendations: Will defer further management to Dr. Harl Bowie.   Assessment and Plan  1. HTN - history of white coat HTN. Some changes to her regimen since I last saw her over a year ago, we are requesting records. On manual recheck she is 140/90. Continue current meds at this time, potential changes once I understand recent changes made by her other providers.     2. Chronic diastolic HF -diuertics recnetly have been managed by nephrolgoy  3. CKD III - follow renal function closely with diuresis  4.  Pulmonary HTN - severely elevated PASP by echo, even after significnat diuresis - RHC with both pulmonary arterial and venous HTN, though primarily arterial.  - we will ask she be seen in CHF clinic to be evaluated for assistance for workup of etiology and possible consideration for pulmonary vasodilators.    Request records from [facility] hematology, Dr Iona Beard nephrology, Dr Elvina Mattes pcp. It has been sometime since she saw me and need to get udpated on her other health conditions and med changes   Arnoldo Lenis, M.D.

## 2017-05-24 ENCOUNTER — Encounter: Payer: Self-pay | Admitting: Cardiology

## 2017-06-10 ENCOUNTER — Emergency Department (HOSPITAL_COMMUNITY): Payer: Medicare HMO

## 2017-06-10 ENCOUNTER — Encounter (HOSPITAL_COMMUNITY): Payer: Self-pay | Admitting: Emergency Medicine

## 2017-06-10 ENCOUNTER — Other Ambulatory Visit: Payer: Self-pay

## 2017-06-10 ENCOUNTER — Inpatient Hospital Stay (HOSPITAL_COMMUNITY): Payer: Medicare HMO

## 2017-06-10 ENCOUNTER — Inpatient Hospital Stay (HOSPITAL_COMMUNITY)
Admission: EM | Admit: 2017-06-10 | Discharge: 2017-06-19 | DRG: 286 | Disposition: A | Discharge status: Home Health | Payer: Medicare HMO | Attending: Internal Medicine | Admitting: Internal Medicine

## 2017-06-10 DIAGNOSIS — Z8249 Family history of ischemic heart disease and other diseases of the circulatory system: Secondary | ICD-10-CM | POA: Diagnosis not present

## 2017-06-10 DIAGNOSIS — E785 Hyperlipidemia, unspecified: Secondary | ICD-10-CM | POA: Diagnosis present

## 2017-06-10 DIAGNOSIS — I130 Hypertensive heart and chronic kidney disease with heart failure and stage 1 through stage 4 chronic kidney disease, or unspecified chronic kidney disease: Principal | ICD-10-CM | POA: Diagnosis present

## 2017-06-10 DIAGNOSIS — E1165 Type 2 diabetes mellitus with hyperglycemia: Secondary | ICD-10-CM | POA: Diagnosis present

## 2017-06-10 DIAGNOSIS — N184 Chronic kidney disease, stage 4 (severe): Secondary | ICD-10-CM | POA: Diagnosis present

## 2017-06-10 DIAGNOSIS — B974 Respiratory syncytial virus as the cause of diseases classified elsewhere: Secondary | ICD-10-CM | POA: Diagnosis not present

## 2017-06-10 DIAGNOSIS — I214 Non-ST elevation (NSTEMI) myocardial infarction: Secondary | ICD-10-CM | POA: Diagnosis not present

## 2017-06-10 DIAGNOSIS — Z79899 Other long term (current) drug therapy: Secondary | ICD-10-CM

## 2017-06-10 DIAGNOSIS — J40 Bronchitis, not specified as acute or chronic: Secondary | ICD-10-CM | POA: Diagnosis not present

## 2017-06-10 DIAGNOSIS — F419 Anxiety disorder, unspecified: Secondary | ICD-10-CM | POA: Diagnosis present

## 2017-06-10 DIAGNOSIS — I2721 Secondary pulmonary arterial hypertension: Secondary | ICD-10-CM | POA: Diagnosis present

## 2017-06-10 DIAGNOSIS — M069 Rheumatoid arthritis, unspecified: Secondary | ICD-10-CM | POA: Diagnosis present

## 2017-06-10 DIAGNOSIS — E1122 Type 2 diabetes mellitus with diabetic chronic kidney disease: Secondary | ICD-10-CM | POA: Diagnosis present

## 2017-06-10 DIAGNOSIS — E871 Hypo-osmolality and hyponatremia: Secondary | ICD-10-CM | POA: Diagnosis present

## 2017-06-10 DIAGNOSIS — I10 Essential (primary) hypertension: Secondary | ICD-10-CM | POA: Diagnosis not present

## 2017-06-10 DIAGNOSIS — E669 Obesity, unspecified: Secondary | ICD-10-CM | POA: Diagnosis present

## 2017-06-10 DIAGNOSIS — D631 Anemia in chronic kidney disease: Secondary | ICD-10-CM | POA: Diagnosis present

## 2017-06-10 DIAGNOSIS — I361 Nonrheumatic tricuspid (valve) insufficiency: Secondary | ICD-10-CM | POA: Diagnosis not present

## 2017-06-10 DIAGNOSIS — I50810 Right heart failure, unspecified: Secondary | ICD-10-CM | POA: Diagnosis not present

## 2017-06-10 DIAGNOSIS — Z7982 Long term (current) use of aspirin: Secondary | ICD-10-CM | POA: Diagnosis not present

## 2017-06-10 DIAGNOSIS — J189 Pneumonia, unspecified organism: Secondary | ICD-10-CM

## 2017-06-10 DIAGNOSIS — I471 Supraventricular tachycardia: Secondary | ICD-10-CM | POA: Diagnosis not present

## 2017-06-10 DIAGNOSIS — I5033 Acute on chronic diastolic (congestive) heart failure: Secondary | ICD-10-CM | POA: Diagnosis present

## 2017-06-10 DIAGNOSIS — K7460 Unspecified cirrhosis of liver: Secondary | ICD-10-CM | POA: Diagnosis present

## 2017-06-10 DIAGNOSIS — R600 Localized edema: Secondary | ICD-10-CM

## 2017-06-10 DIAGNOSIS — F339 Major depressive disorder, recurrent, unspecified: Secondary | ICD-10-CM | POA: Diagnosis present

## 2017-06-10 DIAGNOSIS — R0602 Shortness of breath: Secondary | ICD-10-CM

## 2017-06-10 DIAGNOSIS — D696 Thrombocytopenia, unspecified: Secondary | ICD-10-CM | POA: Diagnosis not present

## 2017-06-10 DIAGNOSIS — N179 Acute kidney failure, unspecified: Secondary | ICD-10-CM | POA: Diagnosis present

## 2017-06-10 DIAGNOSIS — R1013 Epigastric pain: Secondary | ICD-10-CM | POA: Diagnosis not present

## 2017-06-10 DIAGNOSIS — G9340 Encephalopathy, unspecified: Secondary | ICD-10-CM

## 2017-06-10 DIAGNOSIS — G4700 Insomnia, unspecified: Secondary | ICD-10-CM | POA: Diagnosis not present

## 2017-06-10 DIAGNOSIS — R609 Edema, unspecified: Secondary | ICD-10-CM

## 2017-06-10 DIAGNOSIS — E876 Hypokalemia: Secondary | ICD-10-CM | POA: Diagnosis not present

## 2017-06-10 DIAGNOSIS — R109 Unspecified abdominal pain: Secondary | ICD-10-CM

## 2017-06-10 DIAGNOSIS — I5031 Acute diastolic (congestive) heart failure: Secondary | ICD-10-CM | POA: Diagnosis not present

## 2017-06-10 DIAGNOSIS — F919 Conduct disorder, unspecified: Secondary | ICD-10-CM | POA: Diagnosis present

## 2017-06-10 DIAGNOSIS — D500 Iron deficiency anemia secondary to blood loss (chronic): Secondary | ICD-10-CM | POA: Diagnosis present

## 2017-06-10 DIAGNOSIS — I5030 Unspecified diastolic (congestive) heart failure: Secondary | ICD-10-CM

## 2017-06-10 DIAGNOSIS — Z794 Long term (current) use of insulin: Secondary | ICD-10-CM

## 2017-06-10 DIAGNOSIS — R161 Splenomegaly, not elsewhere classified: Secondary | ICD-10-CM | POA: Diagnosis present

## 2017-06-10 DIAGNOSIS — N189 Chronic kidney disease, unspecified: Secondary | ICD-10-CM

## 2017-06-10 DIAGNOSIS — I272 Pulmonary hypertension, unspecified: Secondary | ICD-10-CM

## 2017-06-10 DIAGNOSIS — N19 Unspecified kidney failure: Secondary | ICD-10-CM

## 2017-06-10 DIAGNOSIS — I2729 Other secondary pulmonary hypertension: Secondary | ICD-10-CM

## 2017-06-10 DIAGNOSIS — R188 Other ascites: Secondary | ICD-10-CM | POA: Diagnosis present

## 2017-06-10 DIAGNOSIS — I509 Heart failure, unspecified: Secondary | ICD-10-CM | POA: Diagnosis not present

## 2017-06-10 DIAGNOSIS — Z6830 Body mass index (BMI) 30.0-30.9, adult: Secondary | ICD-10-CM

## 2017-06-10 DIAGNOSIS — I5082 Biventricular heart failure: Secondary | ICD-10-CM | POA: Diagnosis present

## 2017-06-10 DIAGNOSIS — IMO0002 Reserved for concepts with insufficient information to code with codable children: Secondary | ICD-10-CM | POA: Diagnosis present

## 2017-06-10 LAB — URINALYSIS, ROUTINE W REFLEX MICROSCOPIC
Bilirubin Urine: NEGATIVE
GLUCOSE, UA: 150 mg/dL — AB
Hgb urine dipstick: NEGATIVE
Ketones, ur: NEGATIVE mg/dL
Leukocytes, UA: NEGATIVE
Nitrite: NEGATIVE
Protein, ur: 100 mg/dL — AB
SPECIFIC GRAVITY, URINE: 1.01 (ref 1.005–1.030)
pH: 6 (ref 5.0–8.0)

## 2017-06-10 LAB — BASIC METABOLIC PANEL
ANION GAP: 14 (ref 5–15)
BUN: 25 mg/dL — ABNORMAL HIGH (ref 6–20)
CHLORIDE: 97 mmol/L — AB (ref 101–111)
CO2: 18 mmol/L — ABNORMAL LOW (ref 22–32)
Calcium: 8.3 mg/dL — ABNORMAL LOW (ref 8.9–10.3)
Creatinine, Ser: 2.43 mg/dL — ABNORMAL HIGH (ref 0.44–1.00)
GFR calc Af Amer: 22 mL/min — ABNORMAL LOW (ref >60)
GFR, EST NON AFRICAN AMERICAN: 19 mL/min — AB (ref >60)
GLUCOSE: 348 mg/dL — AB (ref 65–99)
POTASSIUM: 4.1 mmol/L (ref 3.5–5.1)
Sodium: 129 mmol/L — ABNORMAL LOW (ref 135–145)

## 2017-06-10 LAB — HEPATIC FUNCTION PANEL
ALT: 13 U/L — AB (ref 14–54)
AST: 25 U/L (ref 15–41)
Albumin: 2.9 g/dL — ABNORMAL LOW (ref 3.5–5.0)
Alkaline Phosphatase: 79 U/L (ref 38–126)
BILIRUBIN DIRECT: 0.2 mg/dL (ref 0.1–0.5)
Indirect Bilirubin: 0.5 mg/dL (ref 0.3–0.9)
Total Bilirubin: 0.7 mg/dL (ref 0.3–1.2)
Total Protein: 6.8 g/dL (ref 6.5–8.1)

## 2017-06-10 LAB — HEMOGLOBIN A1C
HEMOGLOBIN A1C: 5.5 % (ref 4.8–5.6)
MEAN PLASMA GLUCOSE: 111.15 mg/dL

## 2017-06-10 LAB — CBC
HEMATOCRIT: 29.8 % — AB (ref 36.0–46.0)
HEMATOCRIT: 28.9 % — AB (ref 36.0–46.0)
HEMOGLOBIN: 8.9 g/dL — AB (ref 12.0–15.0)
Hemoglobin: 9.1 g/dL — ABNORMAL LOW (ref 12.0–15.0)
MCH: 26.8 pg (ref 26.0–34.0)
MCH: 26.8 pg (ref 26.0–34.0)
MCHC: 30.5 g/dL (ref 30.0–36.0)
MCHC: 30.8 g/dL (ref 30.0–36.0)
MCV: 87.9 fL (ref 78.0–100.0)
MCV: 87 fL (ref 78.0–100.0)
Platelets: 124 10*3/uL — ABNORMAL LOW (ref 150–400)
Platelets: 135 10*3/uL — ABNORMAL LOW (ref 150–400)
RBC: 3.39 MIL/uL — ABNORMAL LOW (ref 3.87–5.11)
RBC: 3.32 MIL/uL — AB (ref 3.87–5.11)
RDW: 16.4 % — ABNORMAL HIGH (ref 11.5–15.5)
RDW: 16.2 % — ABNORMAL HIGH (ref 11.5–15.5)
WBC: 6.5 10*3/uL (ref 4.0–10.5)
WBC: 4.7 10*3/uL (ref 4.0–10.5)

## 2017-06-10 LAB — I-STAT TROPONIN, ED: Troponin i, poc: 0.02 ng/mL (ref 0.00–0.08)

## 2017-06-10 LAB — CREATININE, SERUM
Creatinine, Ser: 2.5 mg/dL — ABNORMAL HIGH (ref 0.44–1.00)
GFR calc Af Amer: 21 mL/min — ABNORMAL LOW (ref >60)
GFR, EST NON AFRICAN AMERICAN: 18 mL/min — AB (ref >60)

## 2017-06-10 LAB — RAPID URINE DRUG SCREEN, HOSP PERFORMED
Amphetamines: NOT DETECTED
BARBITURATES: NOT DETECTED
Benzodiazepines: NOT DETECTED
COCAINE: NOT DETECTED
Opiates: NOT DETECTED
Tetrahydrocannabinol: NOT DETECTED

## 2017-06-10 LAB — CBG MONITORING, ED: GLUCOSE-CAPILLARY: 188 mg/dL — AB (ref 65–99)

## 2017-06-10 LAB — BRAIN NATRIURETIC PEPTIDE: B Natriuretic Peptide: 394.7 pg/mL — ABNORMAL HIGH (ref 0.0–100.0)

## 2017-06-10 LAB — ETHANOL: Alcohol, Ethyl (B): <10 mg/dL (ref <10)

## 2017-06-10 LAB — GLUCOSE, CAPILLARY: Glucose-Capillary: 144 mg/dL — ABNORMAL HIGH (ref 65–99)

## 2017-06-10 LAB — AMMONIA: Ammonia: 49 umol/L — ABNORMAL HIGH (ref 9–35)

## 2017-06-10 MED ORDER — POLYMYXIN B-TRIMETHOPRIM 10000-0.1 UNIT/ML-% OP SOLN
2.0000 [drp] | Freq: Two times a day (BID) | OPHTHALMIC | Status: DC
  Administered 2017-06-10 – 2017-06-19 (×18): 2 [drp] via OPHTHALMIC
  Filled 2017-06-10 (×2): qty 10

## 2017-06-10 MED ORDER — PRAVASTATIN SODIUM 40 MG PO TABS
80.0000 mg | ORAL_TABLET | Freq: Every day | ORAL | Status: DC
  Administered 2017-06-10 – 2017-06-13 (×4): 80 mg via ORAL
  Filled 2017-06-10 (×4): qty 2

## 2017-06-10 MED ORDER — TRAZODONE HCL 50 MG PO TABS
50.0000 mg | ORAL_TABLET | Freq: Every day | ORAL | Status: DC
  Administered 2017-06-10 – 2017-06-16 (×7): 50 mg via ORAL
  Filled 2017-06-10 (×7): qty 1

## 2017-06-10 MED ORDER — FUROSEMIDE 10 MG/ML IJ SOLN
40.0000 mg | Freq: Two times a day (BID) | INTRAMUSCULAR | Status: DC
  Filled 2017-06-10: qty 4

## 2017-06-10 MED ORDER — SODIUM CHLORIDE 0.9% FLUSH
3.0000 mL | Freq: Two times a day (BID) | INTRAVENOUS | Status: DC
  Administered 2017-06-10 – 2017-06-19 (×11): 3 mL via INTRAVENOUS

## 2017-06-10 MED ORDER — KETOROLAC TROMETHAMINE 15 MG/ML IJ SOLN: 15.0000 mg | Freq: Four times a day (QID) | INTRAMUSCULAR | Status: DC | PRN

## 2017-06-10 MED ORDER — FERROUS SULFATE 325 (65 FE) MG PO TABS
325.0000 mg | ORAL_TABLET | Freq: Two times a day (BID) | ORAL | Status: DC
  Administered 2017-06-10 – 2017-06-19 (×14): 325 mg via ORAL
  Filled 2017-06-10 (×20): qty 1

## 2017-06-10 MED ORDER — CLONAZEPAM 0.5 MG PO TABS
0.5000 mg | ORAL_TABLET | Freq: Two times a day (BID) | ORAL | Status: DC
  Administered 2017-06-10 – 2017-06-17 (×13): 0.5 mg via ORAL
  Filled 2017-06-10 (×14): qty 1

## 2017-06-10 MED ORDER — FUROSEMIDE 10 MG/ML IJ SOLN: 60.0000 mg | Freq: Two times a day (BID) | INTRAMUSCULAR | Status: DC

## 2017-06-10 MED ORDER — CLONAZEPAM 0.5 MG PO TABS: 0.5000 mg | ORAL_TABLET | Freq: Two times a day (BID) | ORAL | Status: DC

## 2017-06-10 MED ORDER — FUROSEMIDE 10 MG/ML IJ SOLN
60.0000 mg | INTRAMUSCULAR | Status: AC
  Administered 2017-06-10: 60 mg via INTRAVENOUS
  Filled 2017-06-10: qty 6

## 2017-06-10 MED ORDER — VITAMIN D 1000 UNITS PO TABS
1000.0000 [IU] | ORAL_TABLET | Freq: Two times a day (BID) | ORAL | Status: DC
  Administered 2017-06-10 – 2017-06-19 (×18): 1000 [IU] via ORAL
  Filled 2017-06-10 (×18): qty 1

## 2017-06-10 MED ORDER — SERTRALINE HCL 100 MG PO TABS
100.0000 mg | ORAL_TABLET | Freq: Every day | ORAL | Status: DC
  Administered 2017-06-11 – 2017-06-17 (×6): 100 mg via ORAL
  Filled 2017-06-10 (×7): qty 1

## 2017-06-10 MED ORDER — INSULIN DETEMIR 100 UNIT/ML ~~LOC~~ SOLN
20.0000 [IU] | Freq: Two times a day (BID) | SUBCUTANEOUS | Status: DC
  Administered 2017-06-10 – 2017-06-19 (×16): 20 [IU] via SUBCUTANEOUS
  Filled 2017-06-10 (×19): qty 0.2

## 2017-06-10 MED ORDER — INSULIN ASPART 100 UNIT/ML ~~LOC~~ SOLN
0.0000 [IU] | Freq: Three times a day (TID) | SUBCUTANEOUS | Status: DC
  Administered 2017-06-10 – 2017-06-12 (×2): 3 [IU] via SUBCUTANEOUS
  Administered 2017-06-12: 5 [IU] via SUBCUTANEOUS
  Administered 2017-06-13: 8 [IU] via SUBCUTANEOUS
  Administered 2017-06-13: 5 [IU] via SUBCUTANEOUS
  Administered 2017-06-13: 3 [IU] via SUBCUTANEOUS
  Administered 2017-06-14: 2 [IU] via SUBCUTANEOUS
  Administered 2017-06-15: 3 [IU] via SUBCUTANEOUS
  Administered 2017-06-15 (×2): 2 [IU] via SUBCUTANEOUS
  Administered 2017-06-16: 5 [IU] via SUBCUTANEOUS
  Administered 2017-06-16: 8 [IU] via SUBCUTANEOUS
  Administered 2017-06-16: 11 [IU] via SUBCUTANEOUS
  Administered 2017-06-17: 8 [IU] via SUBCUTANEOUS
  Administered 2017-06-17: 11 [IU] via SUBCUTANEOUS
  Administered 2017-06-18: 3 [IU] via SUBCUTANEOUS
  Administered 2017-06-18 – 2017-06-19 (×2): 5 [IU] via SUBCUTANEOUS
  Administered 2017-06-19: 3 [IU] via SUBCUTANEOUS

## 2017-06-10 MED ORDER — ACETAMINOPHEN 325 MG PO TABS
650.0000 mg | ORAL_TABLET | Freq: Four times a day (QID) | ORAL | Status: DC | PRN
  Administered 2017-06-11 – 2017-06-18 (×4): 650 mg via ORAL
  Filled 2017-06-10 (×4): qty 2

## 2017-06-10 MED ORDER — AMLODIPINE BESYLATE 5 MG PO TABS
5.0000 mg | ORAL_TABLET | Freq: Two times a day (BID) | ORAL | Status: DC
Start: 2017-06-10 — End: 2017-06-19
  Administered 2017-06-10 – 2017-06-19 (×18): 5 mg via ORAL
  Filled 2017-06-10 (×18): qty 1

## 2017-06-10 MED ORDER — CLONIDINE HCL 0.2 MG PO TABS
0.2000 mg | ORAL_TABLET | Freq: Four times a day (QID) | ORAL | Status: DC | PRN
  Administered 2017-06-13 – 2017-06-14 (×2): 0.2 mg via ORAL
  Filled 2017-06-10 (×2): qty 1

## 2017-06-10 MED ORDER — FUROSEMIDE 10 MG/ML IJ SOLN: 60.0000 mg | Freq: Three times a day (TID) | INTRAMUSCULAR | Status: DC

## 2017-06-10 MED ORDER — INSULIN ASPART 100 UNIT/ML FLEXPEN: 5.0000 [IU] | PEN_INJECTOR | Freq: Three times a day (TID) | SUBCUTANEOUS | Status: DC

## 2017-06-10 MED ORDER — DOCUSATE SODIUM 100 MG PO CAPS
200.0000 mg | ORAL_CAPSULE | Freq: Every day | ORAL | Status: DC
Start: 2017-06-10 — End: 2017-06-19
  Administered 2017-06-10 – 2017-06-18 (×8): 200 mg via ORAL
  Filled 2017-06-10 (×8): qty 2

## 2017-06-10 MED ORDER — ACETAMINOPHEN 650 MG RE SUPP: 650.0000 mg | Freq: Four times a day (QID) | RECTAL | Status: DC | PRN

## 2017-06-10 MED ORDER — ASPIRIN EC 81 MG PO TBEC
81.0000 mg | DELAYED_RELEASE_TABLET | Freq: Every day | ORAL | Status: DC
  Administered 2017-06-10 – 2017-06-19 (×10): 81 mg via ORAL
  Filled 2017-06-10 (×10): qty 1

## 2017-06-10 MED ORDER — HYDRALAZINE HCL 10 MG PO TABS
10.0000 mg | ORAL_TABLET | Freq: Two times a day (BID) | ORAL | Status: DC
  Administered 2017-06-10 – 2017-06-11 (×3): 10 mg via ORAL
  Filled 2017-06-10 (×4): qty 1

## 2017-06-10 MED ORDER — INSULIN ASPART 100 UNIT/ML ~~LOC~~ SOLN
3.0000 [IU] | Freq: Three times a day (TID) | SUBCUTANEOUS | Status: DC
  Administered 2017-06-10 – 2017-06-19 (×11): 3 [IU] via SUBCUTANEOUS

## 2017-06-10 MED ORDER — IPRATROPIUM-ALBUTEROL 0.5-2.5 (3) MG/3ML IN SOLN: 3.0000 mL | Freq: Four times a day (QID) | RESPIRATORY_TRACT | Status: DC | PRN

## 2017-06-10 MED ORDER — OXYCODONE HCL 5 MG PO TABS
5.0000 mg | ORAL_TABLET | Freq: Four times a day (QID) | ORAL | Status: DC | PRN
  Administered 2017-06-11 – 2017-06-19 (×8): 5 mg via ORAL
  Filled 2017-06-10 (×8): qty 1

## 2017-06-10 MED ORDER — IBUPROFEN 200 MG PO TABS: 400.0000 mg | ORAL_TABLET | Freq: Four times a day (QID) | ORAL | Status: DC | PRN

## 2017-06-10 MED ORDER — ENOXAPARIN SODIUM 30 MG/0.3ML ~~LOC~~ SOLN
30.0000 mg | SUBCUTANEOUS | Status: DC
  Administered 2017-06-10 – 2017-06-11 (×2): 30 mg via SUBCUTANEOUS
  Filled 2017-06-10 (×2): qty 0.3

## 2017-06-10 MED ORDER — CALCITRIOL 0.5 MCG PO CAPS
0.5000 ug | ORAL_CAPSULE | ORAL | Status: DC
  Administered 2017-06-16 – 2017-06-19 (×2): 0.5 ug via ORAL
  Filled 2017-06-10 (×2): qty 1

## 2017-06-10 NOTE — ED Notes (Signed)
Cardiology @ bedside.

## 2017-06-10 NOTE — ED Notes (Signed)
Patient reports that she has been increasingly short of breath. She states that she has been having bilateral leg swelling with exertional dyspnea with minimal activity. Patient has 3+ ble edema noted. She states that she has apt scheduled next week with cardiology but states that she feels so bad she cannot wait until then. Patient states that she also recently had a fall on her front porch when she was bending over trying to feed the birds outside. Pt denies any severe injuries but does admit to falling back on her bottom. Iv started and protocols initiated.

## 2017-06-10 NOTE — Consult Note (Signed)
Renal Service Consult Note [name]  Stacy Smith 06/10/2017 [place] Requesting Physician:  Dr Evangeline Gula  Reason for Consult:  Renal failure HPI: The patient is a 72 y.o. year-old presenting to ED today with swollen legs and SOB. Recent fall at home. Hx of CKD 3 w creat 1.4- 1.8, now creat here is 2.5.  Asked to see for renal failure.    Pt just got clonazepam in ED and is groggy.  No c/o SOB at this time, +leg edema, no n/v/d, no abd pain, no fevers .  No nsaid use at home.    UA today > clear, 0-5 rbc/ wbc, prot 100.  CXR no edema.   Hx pulm HTN dx'd by R heart cath Jan 2018.  ECHO 9/18 showed LVEF 60%, mild ly reduced RV function.  PASP 83 .  Admits: Sept 16 > new onset seizures, poss from uncont HTN; MRI old strokes, EEG w TME, LP negative. rx with dilantin. Anxiety disorder, DM2 uncont, demand ischemia, echo normal LVEF, HTN on 3-4 bp meds , HL, RA, CKD 3    Home meds: -norvasc 5 bid/ clonidine 0.2 qid prn/ lasix 20 qd/ hydralazine 10 bid/ demadex 5 qd -levemir insulin/ novolog flexpen -ecasa/ neurontin 100 tid/ oxy IR prn/ pravachol/ zoloft 100 qd/ trazodone 50 hs -vitamins/ prns/ duoneb/ colace   ROS  denies CP  no joint pain   no HA  no blurry vision  no rash  no diarrhea  no nausea/ vomiting  no dysuria  no difficulty voiding  no change in urine color    Past Medical History  Past Medical History:  Diagnosis Date  . Anxiety   . Bradycardia   . CHF (congestive heart failure) ([place])   . Chronic kidney disease    CKD Stage 3  . Depression   . Diabetes mellitus without complication ([place])   . Hyperkalemia   . Hypertension   . Lupus   . Peripheral neuropathy   . Rheumatoid arthritis ([place])   . Seizures ([place])   . Shortness of breath dyspnea    Past Surgical History  Past Surgical History:  Procedure Laterality Date  . ABDOMINAL HYSTERECTOMY    . APPENDECTOMY    . CARDIAC CATHETERIZATION N/A 05/18/2016   Procedure: Right Heart  Cath;  Surgeon: Burnell Blanks, MD;  Location: [place] [facility];  Service: Cardiovascular;  Laterality: N/A;  . CYST EXCISION    . DENTAL SURGERY  01/18/15   Family History  Family History  Problem Relation Age of Onset  . Heart failure Mother   . Liver cancer Father   . Lung cancer Father   . Cancer Sister   . Heart murmur Brother    Social History  reports that  has never smoked. she has never used smokeless tobacco. She reports that she does not drink alcohol or use drugs. Allergies  Allergies  Allergen Reactions  . Phenytoin Sodium Extended Itching  . Latex Swelling   Home medications Prior to Admission medications   Medication Sig Start Date End Date Taking? Authorizing Provider  amLODipine (NORVASC) 5 MG tablet Take 5 mg by mouth 2 (two) times daily.    Yes [provider]  aspirin EC 81 MG tablet Take 81 mg by mouth daily.   Yes [provider]  calcitRIOL (ROCALTROL) 0.5 MCG capsule Take 0.5 mcg by mouth See admin instructions. Take 1 capsule  0.62mg every Monday and Friday   Yes [provider]  cholecalciferol (VITAMIN D) 1000 units tablet Take 1,000 Units by mouth 2 (two) times daily.   Yes [provider]  cloNIDine (CATAPRES) 0.2 MG tablet Take 0.2 mg by mouth 4 (four) times daily as needed.    Yes [provider]  docusate sodium (COLACE) 100 MG capsule Take 1 capsule (100 mg total) by mouth 2 (two) times daily. Patient taking differently: Take 200 mg by mouth at bedtime.  01/28/15  Yes Donne Hazel, MD  ferrous sulfate 325 (65 FE) MG tablet Take 325 mg by mouth 2 (two) times daily with a meal.    Yes [provider]  furosemide (LASIX) 20 MG tablet Take 20 mg by mouth daily.    Yes [provider]  gabapentin (NEURONTIN) 100 MG capsule Take 100 mg by mouth 3 (three) times daily.   Yes [provider]  hydrALAZINE (APRESOLINE) 10 MG tablet Take 10 mg by mouth 2 (two) times daily.   Yes  [provider]  insulin aspart (NOVOLOG FLEXPEN) 100 UNIT/ML FlexPen Inject 5 Units into the skin 3 (three) times daily with meals.   Yes [provider]  insulin detemir (LEVEMIR) 100 UNIT/ML injection Inject 20-30 Units into the skin See admin instructions. Take 20-30 units twice daily depending on blood sugar reading   Yes [provider]  ipratropium-albuterol (DUONEB) 0.5-2.5 (3) MG/3ML SOLN Take 3 mLs by nebulization every 6 (six) hours as needed (for shortness of breath).    Yes [provider]  ondansetron (ZOFRAN-ODT) 8 MG disintegrating tablet Take 8 mg by mouth at bedtime.  11/14/14  Yes [provider]  oxyCODONE (OXY IR/ROXICODONE) 5 MG immediate release tablet Take 5 mg by mouth every 6 (six) hours as needed for severe pain.   Yes [provider]  Polymyxin B-Trimethoprim (POLYTRIM OP) Place 2 drops into both eyes 2 (two) times daily.   Yes [provider]  pravastatin (PRAVACHOL) 80 MG tablet Take 80 mg by mouth at bedtime.   Yes [provider]  sertraline (ZOLOFT) 100 MG tablet Take 100 mg by mouth daily.    Yes [provider]  torsemide (DEMADEX) 5 MG tablet Take 5 mg by mouth daily.   Yes [provider]  traZODone (DESYREL) 50 MG tablet Take 50 mg by mouth at bedtime.   Yes [provider]   Liver Function Tests Recent Labs  Lab 06/10/17 1233  AST 25  ALT 13*  ALKPHOS 79  BILITOT 0.7  PROT 6.8  ALBUMIN 2.9*   No results for input(s): LIPASE, AMYLASE in the last 168 hours. CBC Recent Labs  Lab 06/10/17 1037 06/10/17 1631  WBC 6.5 4.7  HGB 9.1* 8.9*  HCT 29.8* 28.9*  MCV 87.9 87.0  PLT 124* 262*   Basic Metabolic Panel Recent Labs  Lab 06/10/17 1037 06/10/17 1631  NA 129*  --   K 4.1  --   CL 97*  --   CO2 18*  --   GLUCOSE 348*  --   BUN 25*  --   CREATININE 2.43* 2.50*  CALCIUM 8.3*  --    Iron/TIBC/Ferritin/ %Sat No results found for: IRON, TIBC,  FERRITIN, IRONPCTSAT  Vitals:   06/10/17 1500 06/10/17 1530 06/10/17 1630 06/10/17 1700  BP: (!) 151/66 (!) 150/49 (!) 146/63 (!) 142/51  Pulse: 70 71 72 70  Resp: _0 Temp: 98.4 F (36.9 C)     TempSrc: Oral     SpO2: 95% 96%  92% 95%   Exam Gen eldelry , frail, no distress, groggy No rash, cyanosis or gangrene Sclera anicteric, throat clear  No jvd or bruits Chest dec'd R base, o/w clear RRR no MRG Abd soft ntnd no mass or ascites +bs obese GU defer MS no joint effusions or deformity Ext 2-3+ pitting bilat LE edema from shins to waist, no wounds or ulcers Neuro is groggy, arousable, moves all ext  UA today > clear, 0-5 rbc/ wbc, prot 100.  CXR no edema.  CXR - clear  Home meds: -norvasc 5 bid/ clonidine 0.2 qid prn/ lasix 20 qd/ hydralazine 10 bid/ demadex 5 qd -levemir insulin/ novolog flexpen -ecasa/ neurontin 100 tid/ oxy IR prn/ pravachol/ zoloft 100 qd/ trazodone 50 hs -vitamins/ prns/ duoneb/ colace  Date   Creat  eGFR Sept 2016 1.1- 1.4 43  Jan 2018 1.80  25 Jun 10, 2017   2.50  19   Impression: 1  CKD stage IV - bump in creat from 1 yr ago may be progression of CKD vs acute decline due to decomp HF.  CKD most likely due to HTN > DM.  UA benign, would not pursue serologic w/u.  BP's good, no nsaid/ acei.  Vol overloaded, sig LE edema. Plan is diurese w/ IV lasix.  Will get renal US. DC'd toradol, no nsaids.  2  HTN long standing, BP's ok, agree continue home bp meds x 3 3  DM on insulin 4  Depression/ anxiety 5  HL 6  Hx pulm HTN 7  RA/ hx lupus (?)    Plan - as above  Kelly Splinter MD [employer] pager [phone]   06/10/2017, 5:53 PM

## 2017-06-10 NOTE — H&P (Signed)
History and Physical    Stacy Smith UXL:[id number] DOB: 02-02-1946 DOA: 06/10/2017  PCP: Jacqualine Code, DO    Patient coming from: Home  I have personally briefly reviewed patient's old medical records in [place]  Chief Complaint: Leg swelling and shortness of breath for 2 weeks  HPI: Stacy Smith is a 72 y.o. 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  Upon presentation to the emergency department family privately told staff that the patient has been having visual hallucinations about dead relatives at her spouse who passed away last 07/04/22.  The patient is often emotional and somewhat needy she has become overly emotional and been crying calling her daughter multiple times.  She woke her daughter in the middle the night twice last night.  He is concerned about this current behavior and does not know what to do anymore.  She has become increasingly short of breath having bilateral leg swelling with exertional dyspnea with minimal activity.  She has 3+ bilateral lower extremity edema and she had appointment next week with a cardiologist but could not wait until then.  [place] recently on her front porch when bending over trying to feed the birds outside.  Her dyspnea is worse with lying down.  She has no chest pain.  She has paroxysmal nocturnal dyspnea and wakes up at night, she has had some confusion.  She is noted she is confused and she has been crying.  Evaluation in the emergency department revealed a chest x-ray consistent with some perhaps bronchitis may be some edema.  Worsening renal function with a creatinine of 2.43  up from 1.97 in 2016/07/04.  Has a mildly elevated ammonia with a vague mention in her primary care doctor's notes about elevated LFTs.  Although today they are not particularly elevated.  Patient's signs and symptoms are very vague and unclear and do not add up to any particular diagnosis.  She will be admitted to the hospital for further diagnostic testing, evaluation and management of her apnea, edema, renal failure and a behavioral health evaluation.  Review of Systems: As per HPI otherwise 10 point review of systems negative.   Past Medical History:  Diagnosis Date  . Anxiety   . Bradycardia   . CHF (congestive heart failure) ([place])   . Chronic kidney disease    CKD Stage 3  . Depression   . Diabetes mellitus without complication ([place])   . Hyperkalemia   . Hypertension   . Lupus   . Peripheral neuropathy   . Rheumatoid arthritis ([place])   . Seizures ([place])   . Shortness of breath dyspnea     Past Surgical History:  Procedure Laterality Date  . ABDOMINAL HYSTERECTOMY    . APPENDECTOMY    . CARDIAC CATHETERIZATION N/A 05/18/2016   Procedure: Right Heart Cath;  Surgeon: Burnell Blanks, MD;  Location: [facility];  Service: Cardiovascular;  Laterality: N/A;  . CYST EXCISION    . DENTAL SURGERY  01/18/15     reports that  has never smoked. she has never used smokeless tobacco. She reports that she does not drink alcohol or use drugs.  Allergies  Allergen Reactions  .  Phenytoin Sodium Extended Itching  . Latex Swelling    Family History  Problem Relation Age of Onset  . Heart failure Mother   . Liver cancer Father   . Lung cancer Father   . Cancer Sister   . Heart murmur Brother      Prior to Admission medications   Medication Sig Start Date End Date Taking? Authorizing Provider  amLODipine (NORVASC) 5 MG tablet Take 5 mg by mouth 2 (two) times daily.    Yes [provider]  aspirin EC 81 MG tablet Take 81 mg by mouth daily.   Yes [provider]  calcitRIOL (ROCALTROL) 0.5 MCG capsule Take 0.5 mcg by mouth See admin instructions. Take 1 capsule  0.47mcg every Monday and Friday   Yes [provider]  cholecalciferol (VITAMIN D) 1000 units tablet Take 1,000 Units by mouth 2 (two) times daily.   Yes [provider]  cloNIDine (CATAPRES) 0.2 MG tablet Take 0.2 mg by mouth 4 (four) times daily as needed.    Yes [provider]  docusate sodium (COLACE) 100 MG capsule Take 1 capsule (100 mg total) by mouth 2 (two) times daily. Patient taking differently: Take 200 mg by mouth at bedtime.  01/28/15  Yes Donne Hazel, MD  ferrous sulfate 325 (65 FE) MG tablet Take 325 mg by mouth 2 (two) times daily with a meal.    Yes [provider]  furosemide (LASIX) 20 MG tablet Take 20 mg by mouth daily.    Yes [provider]  gabapentin (NEURONTIN) 100 MG capsule Take 100 mg by mouth 3 (three) times daily.   Yes [provider]  hydrALAZINE (APRESOLINE) 10 MG tablet Take 10 mg by mouth 2 (two) times daily.   Yes [provider]  insulin aspart (NOVOLOG FLEXPEN) 100 UNIT/ML FlexPen Inject 5 Units into the skin 3 (three) times daily with meals.   Yes [provider]  insulin detemir (LEVEMIR) 100 UNIT/ML injection Inject 20-30 Units into the skin See admin instructions. Take 20-30 units twice daily depending on blood sugar reading   Yes [provider]  ipratropium-albuterol (DUONEB) 0.5-2.5 (3) MG/3ML SOLN Take 3 mLs by nebulization every 6 (six) hours as needed (for shortness of breath).    Yes [provider]  ondansetron (ZOFRAN-ODT) 8 MG disintegrating tablet Take 8 mg by mouth at bedtime.  11/14/14  Yes [provider]  oxyCODONE (OXY IR/ROXICODONE) 5 MG immediate release tablet Take 5 mg by mouth every 6 (six) hours as needed for severe pain.   Yes [provider]  Polymyxin B-Trimethoprim (POLYTRIM OP) Place 2 drops into both eyes 2  (two) times daily.   Yes [provider]  pravastatin (PRAVACHOL) 80 MG tablet Take 80 mg by mouth at bedtime.   Yes [provider]  sertraline (ZOLOFT) 100 MG tablet Take 100 mg by mouth daily.    Yes [provider]  torsemide (DEMADEX) 5 MG tablet Take 5 mg by mouth daily.   Yes [provider]  traZODone (DESYREL) 50 MG tablet Take 50 mg by mouth at bedtime.   Yes [provider]    Physical Exam: Vitals:   06/10/17 1316 06/10/17 1500 06/10/17 1530 06/10/17 1630  BP: (!) 148/52 (!) 151/66 (!) 150/49 (!) 146/63  Pulse: 71 70 71 72  Resp: 20 19 15 12   Temp:  98.4 F (36.9 C)    TempSrc:  Oral    SpO2: 97% 95% 96%  92%   .TCS Constitutional: NAD, calm, comfortable Vitals:   06/10/17 1316 06/10/17 1500 06/10/17 1530 06/10/17 1630  BP: (!) 148/52 (!) 151/66 (!) 150/49 (!) 146/63  Pulse: 71 70 71 72  Resp: 20 19 15 12   Temp:  98.4 F (36.9 C)    TempSrc:  Oral    SpO2: 97% 95% 96% 92%   Eyes: PERRL, lids and conjunctivae normal ENMT: Mucous membranes are moist. Posterior pharynx clear of any exudate or lesions.Normal dentition.  Neck: normal, supple, no masses, no thyromegaly Respiratory: clear to auscultation bilaterally, no wheezing, no crackles. Normal respiratory effort. No accessory muscle use.  Cardiovascular: Regular rate and rhythm, no rubs / gallops.  2/6 systolic ejection murmur heard loudest at the base 2+ pedal pulses. No carotid bruits.  Anasarca to the level of the umbilicus abdomen: no tenderness, no masses palpated. No hepatosplenomegaly. Bowel sounds positive.  Musculoskeletal: no clubbing / cyanosis. No joint deformity upper and lower extremities. Good ROM, no contractures. Normal muscle tone.  Skin: no rashes, lesions, ulcers. No induration Neurologic: CN 2-12 grossly intact. Sensation intact, DTR normal. Strength 5/5 in all 4.  Psychiatric: Defensive when asked about taking her medications, mood is depressed, patient  is very needy.  Labs on Admission: I have personally reviewed following labs and imaging studies  CBC: Recent Labs  Lab 06/10/17 1037  WBC 6.5  HGB 9.1*  HCT 29.8*  MCV 87.9  PLT 035*   Basic Metabolic Panel: Recent Labs  Lab 06/10/17 1037  NA 129*  K 4.1  CL 97*  CO2 18*  GLUCOSE 348*  BUN 25*  CREATININE 2.43*  CALCIUM 8.3*   GFR: CrCl cannot be calculated (Unknown ideal weight.). Liver Function Tests: Recent Labs  Lab 06/10/17 1233  AST 25  ALT 13*  ALKPHOS 79  BILITOT 0.7  PROT 6.8  ALBUMIN 2.9*   No results for input(s): LIPASE, AMYLASE in the last 168 hours. Recent Labs  Lab 06/10/17 1233  AMMONIA 49*   Coagulation Profile: No results for input(s): INR, PROTIME in the last 168 hours. Cardiac Enzymes: No results for input(s): CKTOTAL, CKMB, CKMBINDEX, TROPONINI in the last 168 hours. BNP (last 3 results) No results for input(s): PROBNP in the last 8760 hours. HbA1C: No results for input(s): HGBA1C in the last 72 hours. CBG: No results for input(s): GLUCAP in the last 168 hours. Lipid Profile: No results for input(s): CHOL, HDL, LDLCALC, TRIG, CHOLHDL, LDLDIRECT in the last 72 hours. Thyroid Function Tests: No results for input(s): TSH, T4TOTAL, FREET4, T3FREE, THYROIDAB in the last 72 hours. Anemia Panel: No results for input(s): VITAMINB12, FOLATE, FERRITIN, TIBC, IRON, RETICCTPCT in the last 72 hours. Urine analysis:    Component Value Date/Time   COLORURINE YELLOW 06/10/2017 1120   APPEARANCEUR CLEAR 06/10/2017 1120   LABSPEC 1.010 06/10/2017 1120   PHURINE 6.0 06/10/2017 1120   GLUCOSEU 150 (A) 06/10/2017 1120   HGBUR NEGATIVE 06/10/2017 1120   BILIRUBINUR NEGATIVE 06/10/2017 1120   KETONESUR NEGATIVE 06/10/2017 1120   PROTEINUR 100 (A) 06/10/2017 1120   NITRITE NEGATIVE 06/10/2017 1120   LEUKOCYTESUR NEGATIVE 06/10/2017 1120    Radiological Exams on Admission: Dg Chest 2 View  Result Date: 06/10/2017 CLINICAL DATA:  Leg  swelling and shortness of breath for 2 weeks, history CHF EXAM: CHEST  2 VIEW COMPARISON:  01/25/2015; interval chest radiographs listed on the time line from 2017 are not available for comparison FINDINGS: Upper normal size of cardiac silhouette. Mediastinal contours and pulmonary vascularity  normal. Bronchitic changes with mild RIGHT basilar atelectasis. Lungs otherwise clear. No infiltrate, pleural effusion or pneumothorax. Bones appear demineralized. IMPRESSION: Mild bronchitic changes and RIGHT basilar atelectasis. Electronically Signed   By: Lavonia Dana M.D.   On: 06/10/2017 11:26   Ct Head Wo Contrast  Result Date: 06/10/2017 CLINICAL DATA:  Unexplained altered level of consciousness, headache and dizziness since yesterday, history hypertension, seizures EXAM: CT HEAD WITHOUT CONTRAST TECHNIQUE: Contiguous axial images were obtained from the base of the skull through the vertex without intravenous contrast. Sagittal and coronal MPR images reconstructed from axial data set. COMPARISON:  01/22/2015 FINDINGS: Brain: Mild generalized atrophy. Normal ventricular morphology. No midline shift or mass effect. Old appearing BILATERAL basal ganglia lacunar infarcts. Mild small vessel chronic ischemic changes of deep cerebral white matter. No intracranial hemorrhage, mass lesion, or evidence of acute infarction. No extra-axial fluid collections. Vascular: Atherosclerotic calcification of internal carotid arteries and LEFT vertebral artery at skull base. Skull: Intact Sinuses/Orbits: Tiny mucosal retention cyst LEFT maxillary sinus. Otherwise clear. Other: N/A IMPRESSION: Atrophy with small vessel chronic ischemic changes of deep cerebral white matter. Old BILATERAL basal ganglia lacunar infarcts, though new on RIGHT since 2016. No acute intracranial abnormalities. Electronically Signed   By: Lavonia Dana M.D.   On: 06/10/2017 12:22    EKG: Independently reviewed.  Normal sinus rhythm with nonspecific ST-T wave  abnormalities unchanged from prior  Assessment/Plan Principal Problem:   Right heart failure due to pulmonary hypertension (HCC) Active Problems:   Essential hypertension   Hyperlipidemia   Acute kidney injury superimposed on chronic kidney disease (HCC)   Diabetes type 2, uncontrolled (HCC)   Hyponatremia   Iron deficiency anemia due to chronic blood loss   Major depression, recurrent, chronic ([place])  1.  Right heart failure due to pulmonary hypertension: The patient will be admitted into the hospital we will start her on IV diuretics.  We will hold her oral diuretics will give her Lasix 60 IV twice daily.  Will monitor I's and O's and put her on a fluid restriction.  We will check daily weights.  I have consulted cardiology for assistance.  2.  Essential hypertension: Patient is at very odd medication regimen for her hypertension.  I have consulted Dr. Soyla Murphy from nephrology for assistance.  Also for evaluation of her chronic kidney disease see below.  3.  Acute kidney injury superimposed on chronic kidney disease: I believe the patient may benefit from adjusting her blood pressure medicines her blood pressures are high her kidney function is poor and she is has edema.  There may be an issue with medication compliance.  The patient did become very defensive when I asked her about her medications.  4.  Hyperlipidemia: Noted continue home management.  5.  Diabetes type 2 uncontrolled with chronic kidney disease: Continue home insulin regimen.  Start sliding scale insulin with mealtime coverage.  Monitor fingerstick blood glucoses closely.  6.  Hyponatremia: Likely related to congestive heart failure.  7.  Iron deficiency anemia: Continue home iron dosing.  8.  Major depression: Patient on Zoloft but I do not know if this is the correct medication she is also extremely anxiety ridden.  I am going to start her on a low-dose of clonazepam 0.5 mg p.o. twice daily.  Her degree of anxiety so  severe that she is waking up her daughter at night.  She is requiring somebody to be with her.  Her daughter has offered to move in but she refused  she will not move in with her daughter it seems that she cannot find a solution to her problems that will meet her wants.  Her affect is odd.  She became very defensive when I asked her about medications leading me to suspect that she may not be compliant.  I am going to consult behavioral health for further evaluation.  I have also contacted pastoral care.   DVT prophylaxis: Lovenox Code Status: Full Family Communication: I spoke with the patient's daughter both privately and in the room with the patient. Disposition Plan: Home versus sniff depending on her improvement. Consults called: Dr. Jonnie Finner from nephrology, behavioral health, Dr. Stanford Breed from cardiology Admission status: Inpatient   Lady Deutscher MD [facility] Pager [phone]  If 7PM-7AM, please contact night-coverage [email or web address] Password [facility]  06/10/2017, 4:46 PM

## 2017-06-10 NOTE — Progress Notes (Signed)
Patient transferred from ED to 3E18 via stretch.  Her daughter and son-in-law at bedside.  Dr. Jonnie Finner in for consult.  Patient's daughter informed so she can be in room, if needed.  Patient approved.

## 2017-06-10 NOTE — Consult Note (Signed)
Cardiology Consultation:   Patient ID: EMRI SAMPLE; [id number]; [birth year]   Admit date: 06/10/2017 Date of Consult: 06/10/2017  Primary Care Provider: Jacqualine Code, DO Primary Cardiologist: Dr. Carlyle Dolly.  Primary Electrophysiologist:  none   Patient Profile:   Stacy Smith is a 72 y.o. female with a hx of moderate to severe pulmonary HTN who is being seen today for the evaluation of SOB and LE edema at the request of Dr. Evangeline Gula.  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  She presented to the ER from home with complaints of shortness of breath and leg swelling over the past 2 weeks.  Upon presentation to the emergency department family privately told staff that the patient has been having visual hallucinations about dead relatives after her spouse who passed away last 2022-06-29.  The patient is often emotional and somewhat needy she has become overly emotional and been crying calling her daughter multiple times.  She woke her daughter in the middle the night twice last night.   The patient states that over the past 2 weeks she has had SOB, PND and orthopnea as well as severe LE edema.  She denies any excess sodium in her diet.   She denies any chest pain but says that when she gets SOB occasionally her chest with feel tight.   She has paroxysmal nocturnal dyspnea and wakes up at night, she has had some confusion.   Evaluation in the emergency department revealed a chest x-ray consistent with bronchitis.  Her renal function has worsened with a creatinine of 2.43 up from 1.97 in 06/29/16.  Ammonia level elevated.  She will be admitted to the hospital by [facility] for further  diagnostic testing, evaluation and management of her apnea, edema, renal failure and a behavioral health evaluation.  Echo 02/01/17 ([place]) EF 60-65, normal RV SF, moderate pulmonary hypertension, PASP 58, mild MR, mild TR, mild PI  Right heart catheterization 05/18/16 Mean RA 8 RVSP 76 PASP 78 mean PA 45 Moderate to severe pulmonary hypertension  Echo 03/16/16 Mild LVH, EF 50, diffuse HK, grade 2 diastolic dysfunction, moderate MR, severe LAE, mildly reduced RVSF, mild TR, PASP 83 (severe pulmonary hypertension)  Past Medical History:  Diagnosis Date  . Anxiety   . Bradycardia   . CHF (congestive heart failure) ([place])   . Chronic kidney disease    CKD Stage 3  . Depression   . Diabetes mellitus without complication ([place])   . Hyperkalemia   . Hypertension   . Lupus   . Peripheral neuropathy   . Rheumatoid arthritis ([place])   . Seizures ([place])   . Shortness of breath dyspnea     Past Surgical History:  Procedure Laterality Date  . ABDOMINAL HYSTERECTOMY    . APPENDECTOMY    . CARDIAC CATHETERIZATION N/A 05/18/2016   Procedure: Right Heart Cath;  Surgeon: Burnell Blanks, MD;  Location: [facility];  Service: Cardiovascular;  Laterality: N/A;  . CYST EXCISION    . DENTAL SURGERY  01/18/15     Home Medications:  Prior to Admission medications   Medication Sig Start Date End Date Taking? Authorizing Provider  amLODipine (NORVASC) 5 MG tablet Take 5 mg by mouth 2 (two) times  daily.    Yes [provider]  aspirin EC 81 MG tablet Take 81 mg by mouth daily.   Yes [provider]  calcitRIOL (ROCALTROL) 0.5 MCG capsule Take 0.5 mcg by mouth See admin instructions. Take 1 capsule  0.75mcg every Monday and Friday   Yes [provider]  cholecalciferol (VITAMIN D) 1000 units tablet Take 1,000 Units by mouth 2 (two) times daily.   Yes [provider]  cloNIDine (CATAPRES) 0.2 MG tablet Take 0.2 mg by mouth 4 (four) times daily as  needed.    Yes [provider]  docusate sodium (COLACE) 100 MG capsule Take 1 capsule (100 mg total) by mouth 2 (two) times daily. Patient taking differently: Take 200 mg by mouth at bedtime.  01/28/15  Yes Donne Hazel, MD  ferrous sulfate 325 (65 FE) MG tablet Take 325 mg by mouth 2 (two) times daily with a meal.    Yes [provider]  furosemide (LASIX) 20 MG tablet Take 20 mg by mouth daily.    Yes [provider]  gabapentin (NEURONTIN) 100 MG capsule Take 100 mg by mouth 3 (three) times daily.   Yes [provider]  hydrALAZINE (APRESOLINE) 10 MG tablet Take 10 mg by mouth 2 (two) times daily.   Yes [provider]  insulin aspart (NOVOLOG FLEXPEN) 100 UNIT/ML FlexPen Inject 5 Units into the skin 3 (three) times daily with meals.   Yes [provider]  insulin detemir (LEVEMIR) 100 UNIT/ML injection Inject 20-30 Units into the skin See admin instructions. Take 20-30 units twice daily depending on blood sugar reading   Yes [provider]  ipratropium-albuterol (DUONEB) 0.5-2.5 (3) MG/3ML SOLN Take 3 mLs by nebulization every 6 (six) hours as needed (for shortness of breath).    Yes [provider]  ondansetron (ZOFRAN-ODT) 8 MG disintegrating tablet Take 8 mg by mouth at bedtime.  11/14/14  Yes [provider]  oxyCODONE (OXY IR/ROXICODONE) 5 MG immediate release tablet Take 5 mg by mouth every 6 (six) hours as needed for severe pain.   Yes [provider]  Polymyxin B-Trimethoprim (POLYTRIM OP) Place 2 drops into both eyes 2 (two) times daily.   Yes [provider]  pravastatin (PRAVACHOL) 80 MG tablet Take 80 mg by mouth at bedtime.   Yes [provider]  sertraline (ZOLOFT) 100 MG tablet Take 100 mg by mouth daily.    Yes [provider]  torsemide (DEMADEX) 5 MG tablet Take 5 mg by mouth daily.   Yes [provider]  traZODone (DESYREL) 50 MG tablet Take 50 mg by  mouth at bedtime.   Yes [provider]    Inpatient Medications: Scheduled Meds: . amLODipine  5 mg Oral BID  . aspirin EC  81 mg Oral Daily  . calcitRIOL  0.5 mcg Oral See admin instructions  . cholecalciferol  1,000 Units Oral BID  . clonazePAM  0.5 mg Oral BID  . docusate sodium  200 mg Oral QHS  . enoxaparin (LOVENOX) injection  30 mg Subcutaneous Q24H  . ferrous sulfate  325 mg Oral BID WC  . furosemide  60 mg Intravenous BID  . furosemide  60 mg Intravenous STAT  . hydrALAZINE  10 mg Oral BID  . insulin aspart  5 Units Subcutaneous TID WC  . insulin aspart  0-15 Units Subcutaneous TID WC  . insulin aspart  3 Units Subcutaneous TID WC  . insulin detemir  20 Units  Subcutaneous BID  . pravastatin  80 mg Oral QHS  . sertraline  100 mg Oral Daily  . sodium chloride flush  3 mL Intravenous Q12H  . traZODone  50 mg Oral QHS  . trimethoprim-polymyxin b  2 drop Both Eyes BID   Continuous Infusions:  PRN Meds: acetaminophen **OR** acetaminophen, cloNIDine, ibuprofen, ipratropium-albuterol, ketorolac, oxyCODONE  Allergies:    Allergies  Allergen Reactions  . Phenytoin Sodium Extended Itching  . Latex Swelling    Social History:   Social History   Socioeconomic History  . Marital status: Widowed    Spouse name: Not on file  . Number of children: Not on file  . Years of education: Not on file  . Highest education level: Not on file  Social Needs  . Financial resource strain: Not on file  . Food insecurity - worry: Not on file  . Food insecurity - inability: Not on file  . Transportation needs - medical: Not on file  . Transportation needs - non-medical: Not on file  Occupational History  . Not on file  Tobacco Use  . Smoking status: Never Smoker  . Smokeless tobacco: Never Used  Substance and Sexual Activity  . Alcohol use: No    Alcohol/week: 0.0 oz  . Drug use: No  . Sexual activity: Not on file  Other Topics Concern  . Not on file  Social History  Narrative  . Not on file    Family History:    Family History  Problem Relation Age of Onset  . Heart failure Mother   . Liver cancer Father   . Lung cancer Father   . Cancer Sister   . Heart murmur Brother      ROS:  Please see the history of present illness.  See HPI  All other ROS reviewed and negative.     Physical Exam/Data:   Vitals:   06/10/17 1316 06/10/17 1500 06/10/17 1530 06/10/17 1630  BP: (!) 148/52 (!) 151/66 (!) 150/49 (!) 146/63  Pulse: 71 70 71 72  Resp: 20 19 15 12   Temp:  98.4 F (36.9 C)    TempSrc:  Oral    SpO2: 97% 95% 96% 92%   No intake or output data in the 24 hours ending 06/10/17 1640 There were no vitals filed for this visit. There is no height or weight on file to calculate BMI.  General:  Well nourished, well developed, in no acute distress HEENT: normal Lymph: no adenopathy Neck: no JVD Endocrine:  No thryomegaly Vascular: No carotid bruits; FA pulses 2+ bilaterally without bruits  Cardiac:  normal S1, S2; RRR; no murmur  Lungs:  clear to auscultation bilaterally, no wheezing, rhonchi or rales  Abd: soft, nontender, no hepatomegaly  Ext: 2+ LE edema Musculoskeletal:  No deformities, BUE and BLE strength normal and equal Skin: warm and dry  Neuro:  CNs 2-12 intact, no focal abnormalities noted Psych:  Normal affect   EKG:  The EKG was personally reviewed and demonstrates:  NSR with nonspecific ST abnormality Telemetry:  Telemetry was personally reviewed and demonstrates:  NSR  Relevant CV Studies: RHC 05/2016 with moderate to severe pulmonary HTN  Laboratory Data:  Chemistry Recent Labs  Lab 06/10/17 1037  NA 129*  K 4.1  CL 97*  CO2 18*  GLUCOSE 348*  BUN 25*  CREATININE 2.43*  CALCIUM 8.3*  GFRNONAA 19*  GFRAA 22*  ANIONGAP 14    Recent Labs  Lab 06/10/17 1233  PROT 6.8  ALBUMIN 2.9*  AST 25  ALT 13*  ALKPHOS 79  BILITOT 0.7   Hematology Recent Labs  Lab 06/10/17 1037  WBC 6.5  RBC 3.39*  HGB 9.1*   HCT 29.8*  MCV 87.9  MCH 26.8  MCHC 30.5  RDW 16.4*  PLT 124*   Cardiac EnzymesNo results for input(s): TROPONINI in the last 168 hours.  Recent Labs  Lab 06/10/17 1053  TROPIPOC 0.02    BNPNo results for input(s): BNP, PROBNP in the last 168 hours.  DDimer No results for input(s): DDIMER in the last 168 hours.  Radiology/Studies:  Dg Chest 2 View  Result Date: 06/10/2017 CLINICAL DATA:  Leg swelling and shortness of breath for 2 weeks, history CHF EXAM: CHEST  2 VIEW COMPARISON:  01/25/2015; interval chest radiographs listed on the time line from 2017 are not available for comparison FINDINGS: Upper normal size of cardiac silhouette. Mediastinal contours and pulmonary vascularity normal. Bronchitic changes with mild RIGHT basilar atelectasis. Lungs otherwise clear. No infiltrate, pleural effusion or pneumothorax. Bones appear demineralized. IMPRESSION: Mild bronchitic changes and RIGHT basilar atelectasis. Electronically Signed   By: Lavonia Dana M.D.   On: 06/10/2017 11:26   Ct Head Wo Contrast  Result Date: 06/10/2017 CLINICAL DATA:  Unexplained altered level of consciousness, headache and dizziness since yesterday, history hypertension, seizures EXAM: CT HEAD WITHOUT CONTRAST TECHNIQUE: Contiguous axial images were obtained from the base of the skull through the vertex without intravenous contrast. Sagittal and coronal MPR images reconstructed from axial data set. COMPARISON:  01/22/2015 FINDINGS: Brain: Mild generalized atrophy. Normal ventricular morphology. No midline shift or mass effect. Old appearing BILATERAL basal ganglia lacunar infarcts. Mild small vessel chronic ischemic changes of deep cerebral white matter. No intracranial hemorrhage, mass lesion, or evidence of acute infarction. No extra-axial fluid collections. Vascular: Atherosclerotic calcification of internal carotid arteries and LEFT vertebral artery at skull base. Skull: Intact Sinuses/Orbits: Tiny mucosal retention  cyst LEFT maxillary sinus. Otherwise clear. Other: N/A IMPRESSION: Atrophy with small vessel chronic ischemic changes of deep cerebral white matter. Old BILATERAL basal ganglia lacunar infarcts, though new on RIGHT since 2016. No acute intracranial abnormalities. Electronically Signed   By: Lavonia Dana M.D.   On: 06/10/2017 12:22    Assessment and Plan:   1.  Acute on chronic diastolic CHF - this appears to be mainly right heart failure with marked LE edema as well as edema in abdomen.  Her lungs do not sound bad and cxray with no edema.  ? Whether she has developed cor pulmonale from her pulmonary HTN with RHF. - check BNP - repeat 2D echo since last one was at an outside hospital and several months ago and now with acute decompensation. - Start lasix 40mg  IV BID - may need repeat RHC - will ask advanced HF to see in am as she was supposed to see them next week.   2.  Moderate to severe pulmonary HTN by RHC a year ago.  Suspect this has progressed and now with right heart failure.  - start IV diuretics - follow renal function closely - AHF to see in am.  Will likely need repeat RHC  3.  Acute on chronic kidney disease likely related to passive congestion from HF.  This should improve with diuresis. - follow creatinine closely with initiation of diuretics.  4.  HTN - BP stable - continue amlodipine   For questions or updates, please contact [place] Please consult [email or web address] for contact info under Cardiology/STEMI.  Signed, Richardson Dopp, PA-C  06/10/2017 4:40 PM

## 2017-06-10 NOTE — ED Notes (Signed)
Hospitalist aware of pt's distress, orders received

## 2017-06-10 NOTE — ED Notes (Signed)
Family is concerned that patient is having visual hallucinations about dead relatives and her spouse. Family states that the patient has been overly emotional and has been crying and calling her sister nonstop. Family does not know what to do about current behavior and is concerned for patient's well being.

## 2017-06-10 NOTE — ED Provider Notes (Signed)
[facility] EMERGENCY DEPARTMENT Provider Note   CSN: [id number] Arrival date & time: 06/10/17  1016     History   Chief Complaint Chief Complaint  Patient presents with  . Leg Swelling  . Shortness of Breath    HPI Stacy Smith is a 72 y.o. female.  HPI Patient presents with shortness of breath.  Has been going on for the last few weeks but worsening recently.  States the swelling is gone from her legs up to her abdomen.  Does have history of CHF.  Also has had some recent falls.  Worsening shortness of breath with lying down.  No real chest pain.  Also has had confusion.  Patient states she knows she is been confused and has been crying.  Discussed with patient's family they state that she has been talking to relatives that are not there. Past Medical History:  Diagnosis Date  . Anxiety   . Bradycardia   . CHF (congestive heart failure) ([place])   . Chronic kidney disease    CKD Stage 3  . Depression   . Diabetes mellitus without complication ([place])   . Hyperkalemia   . Hypertension   . Lupus   . Peripheral neuropathy   . Rheumatoid arthritis ([place])   . Seizures ([place])   . Shortness of breath dyspnea     Patient Active Problem List   Diagnosis Date Noted  . Seizures ([place])   . Delirium   . Diabetes type 2, uncontrolled ([place])   . Demand ischemia ([place])   . Sinus tachycardia   . Ischemic chest pain   . Diabetes type 2, controlled ([place])   . Convulsion ([place])   . Altered mental state   . HLD (hyperlipidemia)   . Rheumatoid arthritis ([place])   . Lupus (systemic lupus erythematosus) ([place])   . CKD (chronic kidney disease), stage III ([place])   . Hypokalemia   . Hypomagnesemia   . Convulsions/seizures ([place]) 01/22/2015  . Diabetes ([place]) 01/22/2015  . Essential hypertension 01/22/2015  . Hyperlipidemia 01/22/2015  . Seizure ([place]) 01/22/2015    Past Surgical History:  Procedure Laterality Date  . ABDOMINAL HYSTERECTOMY    . APPENDECTOMY    . CARDIAC  CATHETERIZATION N/A 05/18/2016   Procedure: Right Heart Cath;  Surgeon: Burnell Blanks, MD;  Location: [facility];  Service: Cardiovascular;  Laterality: N/A;  . CYST EXCISION    . DENTAL SURGERY  01/18/15    OB History    No data available       Home Medications    Prior to Admission medications   Medication Sig Start Date End Date Taking? Authorizing Provider  amLODipine (NORVASC) 5 MG tablet Take 5 mg by mouth 2 (two) times daily.    Yes [provider]  aspirin EC 81 MG tablet Take 81 mg by mouth daily.   Yes [provider]  calcitRIOL (ROCALTROL) 0.5 MCG capsule Take 0.5 mcg by mouth See admin instructions. Take 1 capsule  0.35mcg every Monday and Friday   Yes [provider]  cholecalciferol (VITAMIN D) 1000 units tablet Take 1,000 Units by mouth 2 (two) times daily.   Yes [provider]  cloNIDine (CATAPRES) 0.2 MG tablet Take 0.2 mg by mouth 4 (four) times daily as needed.    Yes [provider]  docusate sodium (COLACE) 100 MG capsule Take 1 capsule (100 mg total) by mouth 2 (two) times daily. Patient taking differently: Take 200 mg by mouth  at bedtime.  01/28/15  Yes Donne Hazel, MD  ferrous sulfate 325 (65 FE) MG tablet Take 325 mg by mouth 2 (two) times daily with a meal.    Yes [provider]  furosemide (LASIX) 20 MG tablet Take 20 mg by mouth daily.    Yes [provider]  gabapentin (NEURONTIN) 100 MG capsule Take 100 mg by mouth 3 (three) times daily.   Yes [provider]  hydrALAZINE (APRESOLINE) 10 MG tablet Take 10 mg by mouth 2 (two) times daily.   Yes [provider]  insulin aspart (NOVOLOG FLEXPEN) 100 UNIT/ML FlexPen Inject 5 Units into the skin 3 (three) times daily with meals.   Yes [provider]  insulin detemir (LEVEMIR) 100 UNIT/ML injection Inject 20-30 Units into the skin See admin instructions. Take 20-30 units twice daily depending on blood sugar  reading   Yes [provider]  ipratropium-albuterol (DUONEB) 0.5-2.5 (3) MG/3ML SOLN Take 3 mLs by nebulization every 6 (six) hours as needed (for shortness of breath).    Yes [provider]  ondansetron (ZOFRAN-ODT) 8 MG disintegrating tablet Take 8 mg by mouth at bedtime.  11/14/14  Yes [provider]  oxyCODONE (OXY IR/ROXICODONE) 5 MG immediate release tablet Take 5 mg by mouth every 6 (six) hours as needed for severe pain.   Yes [provider]  Polymyxin B-Trimethoprim (POLYTRIM OP) Place 2 drops into both eyes 2 (two) times daily.   Yes [provider]  pravastatin (PRAVACHOL) 80 MG tablet Take 80 mg by mouth at bedtime.   Yes [provider]  sertraline (ZOLOFT) 100 MG tablet Take 100 mg by mouth daily.    Yes [provider]  torsemide (DEMADEX) 5 MG tablet Take 5 mg by mouth daily.   Yes [provider]  traZODone (DESYREL) 50 MG tablet Take 50 mg by mouth at bedtime.   Yes [provider]    Family History Family History  Problem Relation Age of Onset  . Heart failure Mother   . Liver cancer Father   . Lung cancer Father   . Cancer Sister   . Heart murmur Brother     Social History Social History   Tobacco Use  . Smoking status: Never Smoker  . Smokeless tobacco: Never Used  Substance Use Topics  . Alcohol use: No    Alcohol/week: 0.0 oz  . Drug use: No     Allergies   Phenytoin sodium extended and Latex   Review of Systems Review of Systems  Constitutional: Negative for appetite change and fatigue.  HENT: Negative for dental problem.   Respiratory: Positive for shortness of breath.   Cardiovascular: Positive for leg swelling.  Gastrointestinal: Negative for abdominal pain.  Endocrine: Negative for polyuria.  Genitourinary: Negative for frequency.  Musculoskeletal: Negative for gait problem.  Skin: Negative for rash.  Neurological: Positive for weakness.    Psychiatric/Behavioral: Positive for confusion and hallucinations. Negative for suicidal ideas.     Physical Exam Updated Vital Signs BP (!) 151/66 (BP Location: Right Arm)   Pulse 70   Temp 98.4 F (36.9 C) (Oral)   Resp 19   SpO2 95%   Physical Exam  Constitutional: She appears well-developed and well-nourished.  HENT:  Head: Normocephalic.  Eyes: Pupils are equal, round, and reactive to light.  Neck: JVD present.  Pulmonary/Chest:  Few scattered rales.  Abdominal: Soft. There is no tenderness.  Musculoskeletal:       Right lower  leg: She exhibits edema.       Left lower leg: She exhibits edema.  Moderate pitting edema bilateral lower extremities.  Neurological: She is alert.  Skin: Skin is warm. Capillary refill takes less than 2 seconds.  Psychiatric: She has a normal mood and affect.     ED Treatments / Results  Labs (all labs ordered are listed, but only abnormal results are displayed) Labs Reviewed  BASIC METABOLIC PANEL - Abnormal; Notable for the following components:      Result Value   Sodium 129 (*)    Chloride 97 (*)    CO2 18 (*)    Glucose, Bld 348 (*)    BUN 25 (*)    Creatinine, Ser 2.43 (*)    Calcium 8.3 (*)    GFR calc non Af Amer 19 (*)    GFR calc Af Amer 22 (*)    All other components within normal limits  CBC - Abnormal; Notable for the following components:   RBC 3.39 (*)    Hemoglobin 9.1 (*)    HCT 29.8 (*)    RDW 16.4 (*)    Platelets 124 (*)    All other components within normal limits  HEPATIC FUNCTION PANEL - Abnormal; Notable for the following components:   Albumin 2.9 (*)    ALT 13 (*)    All other components within normal limits  AMMONIA - Abnormal; Notable for the following components:   Ammonia 49 (*)    All other components within normal limits  URINALYSIS, ROUTINE W REFLEX MICROSCOPIC - Abnormal; Notable for the following components:   Glucose, UA 150 (*)    Protein, ur 100 (*)    Bacteria, UA RARE (*)     Squamous Epithelial / LPF 0-5 (*)    All other components within normal limits  ETHANOL  RAPID URINE DRUG SCREEN, HOSP PERFORMED  I-STAT TROPONIN, ED    EKG  EKG Interpretation  Date/Time:  Saturday June 10 2017 10:25:59 EST Ventricular Rate:  74 PR Interval:  160 QRS Duration: 94 QT Interval:  390 QTC Calculation: 432 R Axis:   -18 Text Interpretation:  Normal sinus rhythm Nonspecific ST abnormality Abnormal ECG Confirmed by Davonna Belling [phone]) on 06/10/2017 10:42:52 AM       Radiology Dg Chest 2 View  Result Date: 06/10/2017 CLINICAL DATA:  Leg swelling and shortness of breath for 2 weeks, history CHF EXAM: CHEST  2 VIEW COMPARISON:  01/25/2015; interval chest radiographs listed on the time line from 2017 are not available for comparison FINDINGS: Upper normal size of cardiac silhouette. Mediastinal contours and pulmonary vascularity normal. Bronchitic changes with mild RIGHT basilar atelectasis. Lungs otherwise clear. No infiltrate, pleural effusion or pneumothorax. Bones appear demineralized. IMPRESSION: Mild bronchitic changes and RIGHT basilar atelectasis. Electronically Signed   By: Lavonia Dana M.D.   On: 06/10/2017 11:26   Ct Head Wo Contrast  Result Date: 06/10/2017 CLINICAL DATA:  Unexplained altered level of consciousness, headache and dizziness since yesterday, history hypertension, seizures EXAM: CT HEAD WITHOUT CONTRAST TECHNIQUE: Contiguous axial images were obtained from the base of the skull through the vertex without intravenous contrast. Sagittal and coronal MPR images reconstructed from axial data set. COMPARISON:  01/22/2015 FINDINGS: Brain: Mild generalized atrophy. Normal ventricular morphology. No midline shift or mass effect. Old appearing BILATERAL basal ganglia lacunar infarcts. Mild small vessel chronic ischemic changes of deep cerebral white matter. No intracranial hemorrhage, mass lesion, or evidence of acute infarction. No extra-axial fluid  collections. Vascular: Atherosclerotic calcification of internal carotid arteries and LEFT vertebral artery at skull base. Skull: Intact Sinuses/Orbits: Tiny mucosal retention cyst LEFT maxillary sinus. Otherwise clear. Other: N/A IMPRESSION: Atrophy with small vessel chronic ischemic changes of deep cerebral white matter. Old BILATERAL basal ganglia lacunar infarcts, though new on RIGHT since 2016. No acute intracranial abnormalities. Electronically Signed   By: Lavonia Dana M.D.   On: 06/10/2017 12:22    Procedures Procedures (including critical care time)  Medications Ordered in ED Medications - No data to display   Initial Impression / Assessment and Plan / ED Course  I have reviewed the triage vital signs and the nursing notes.  Pertinent labs & imaging results that were available during my care of the patient were reviewed by me and considered in my medical decision making (see chart for details).     Patient with shortness of breath and leg swelling.  History of CHF.  Chest x-ray shows some bronchitis.  Has worsening renal function.  Does have a history of renal insufficiency.  Ammonia mildly elevated.  Head CT done and reassuring.  Patient reportedly has been hallucinating also.  Will admit to internal medicine unassigned for further evaluation and treatment.  Final Clinical Impressions(s) / ED Diagnoses   Final diagnoses:  Peripheral edema  Encephalopathy  Hyponatremia    ED Discharge Orders    None       Davonna Belling, MD 06/10/17 1512

## 2017-06-10 NOTE — ED Notes (Signed)
Returned from ct scan 

## 2017-06-10 NOTE — ED Notes (Signed)
Patient currently in ct scan

## 2017-06-10 NOTE — ED Triage Notes (Signed)
Pt. Stated, Stacy Smith got swollen legs and SOB for 2 weeks.

## 2017-06-11 ENCOUNTER — Inpatient Hospital Stay (HOSPITAL_COMMUNITY): Payer: Medicare HMO

## 2017-06-11 DIAGNOSIS — E1165 Type 2 diabetes mellitus with hyperglycemia: Secondary | ICD-10-CM

## 2017-06-11 DIAGNOSIS — E871 Hypo-osmolality and hyponatremia: Secondary | ICD-10-CM

## 2017-06-11 DIAGNOSIS — G4700 Insomnia, unspecified: Secondary | ICD-10-CM

## 2017-06-11 DIAGNOSIS — R609 Edema, unspecified: Secondary | ICD-10-CM

## 2017-06-11 DIAGNOSIS — I361 Nonrheumatic tricuspid (valve) insufficiency: Secondary | ICD-10-CM

## 2017-06-11 LAB — IRON AND TIBC
Iron: 41 ug/dL (ref 28–170)
SATURATION RATIOS: 18 % (ref 10.4–31.8)
TIBC: 223 ug/dL — ABNORMAL LOW (ref 250–450)
UIBC: 182 ug/dL

## 2017-06-11 LAB — ECHOCARDIOGRAM COMPLETE
Height: 60 in
Weight: 2694.9 oz

## 2017-06-11 LAB — CBC
HCT: 31.5 % — ABNORMAL LOW (ref 36.0–46.0)
Hemoglobin: 9.7 g/dL — ABNORMAL LOW (ref 12.0–15.0)
MCH: 26.8 pg (ref 26.0–34.0)
MCHC: 30.8 g/dL (ref 30.0–36.0)
MCV: 87 fL (ref 78.0–100.0)
Platelets: 163 10*3/uL (ref 150–400)
RBC: 3.62 MIL/uL — ABNORMAL LOW (ref 3.87–5.11)
RDW: 16.4 % — AB (ref 11.5–15.5)
WBC: 5 10*3/uL (ref 4.0–10.5)

## 2017-06-11 LAB — FERRITIN: Ferritin: 122 ng/mL (ref 11–307)

## 2017-06-11 LAB — GLUCOSE, CAPILLARY
GLUCOSE-CAPILLARY: 99 mg/dL (ref 65–99)
GLUCOSE-CAPILLARY: 84 mg/dL (ref 65–99)
Glucose-Capillary: 103 mg/dL — ABNORMAL HIGH (ref 65–99)
Glucose-Capillary: 114 mg/dL — ABNORMAL HIGH (ref 65–99)

## 2017-06-11 LAB — BASIC METABOLIC PANEL
Anion gap: 14 (ref 5–15)
BUN: 23 mg/dL — AB (ref 6–20)
CO2: 22 mmol/L (ref 22–32)
Calcium: 8.8 mg/dL — ABNORMAL LOW (ref 8.9–10.3)
Chloride: 100 mmol/L — ABNORMAL LOW (ref 101–111)
Creatinine, Ser: 2.32 mg/dL — ABNORMAL HIGH (ref 0.44–1.00)
GFR calc Af Amer: 23 mL/min — ABNORMAL LOW (ref >60)
GFR, EST NON AFRICAN AMERICAN: 20 mL/min — AB (ref >60)
GLUCOSE: 102 mg/dL — AB (ref 65–99)
POTASSIUM: 3.7 mmol/L (ref 3.5–5.1)
Sodium: 136 mmol/L (ref 135–145)

## 2017-06-11 LAB — BRAIN NATRIURETIC PEPTIDE: B Natriuretic Peptide: 470.6 pg/mL — ABNORMAL HIGH (ref 0.0–100.0)

## 2017-06-11 MED ORDER — SODIUM CHLORIDE 0.9 % IV SOLN: 250.0000 mL | INTRAVENOUS | Status: DC | PRN

## 2017-06-11 MED ORDER — FUROSEMIDE 10 MG/ML IJ SOLN
80.0000 mg | Freq: Two times a day (BID) | INTRAMUSCULAR | Status: DC
  Filled 2017-06-11: qty 8

## 2017-06-11 MED ORDER — FUROSEMIDE 10 MG/ML IJ SOLN: 80.0000 mg | Freq: Two times a day (BID) | INTRAMUSCULAR | Status: DC

## 2017-06-11 MED ORDER — POTASSIUM CHLORIDE CRYS ER 20 MEQ PO TBCR
40.0000 meq | EXTENDED_RELEASE_TABLET | Freq: Once | ORAL | Status: AC
  Administered 2017-06-11: 40 meq via ORAL
  Filled 2017-06-11: qty 2

## 2017-06-11 MED ORDER — SODIUM CHLORIDE 0.9% FLUSH: 3.0000 mL | Freq: Two times a day (BID) | INTRAVENOUS | Status: DC

## 2017-06-11 MED ORDER — SODIUM CHLORIDE 0.9 % IV SOLN
INTRAVENOUS | Status: DC
  Administered 2017-06-12: 06:00:00 via INTRAVENOUS

## 2017-06-11 MED ORDER — TECHNETIUM TC 99M DIETHYLENETRIAME-PENTAACETIC ACID
31.7000 | Freq: Once | INTRAVENOUS | Status: AC | PRN
  Administered 2017-06-11: 31.7 via RESPIRATORY_TRACT

## 2017-06-11 MED ORDER — SODIUM CHLORIDE 0.9% FLUSH: 3.0000 mL | INTRAVENOUS | Status: DC | PRN

## 2017-06-11 MED ORDER — TECHNETIUM TO 99M ALBUMIN AGGREGATED
4.3800 | Freq: Once | INTRAVENOUS | Status: AC | PRN
  Administered 2017-06-11: 4.38 via INTRAVENOUS

## 2017-06-11 MED ORDER — FUROSEMIDE 10 MG/ML IJ SOLN
80.0000 mg | Freq: Three times a day (TID) | INTRAMUSCULAR | Status: DC
  Administered 2017-06-11 – 2017-06-12 (×3): 80 mg via INTRAVENOUS
  Filled 2017-06-11 (×2): qty 8

## 2017-06-11 NOTE — Evaluation (Signed)
Clinical/Bedside Swallow Evaluation Patient Details  Name: Stacy Smith MRN: [id number] Date of Birth: 03-04-1946  Today's Date: 06/11/2017 Time: SLP Start Time (ACUTE ONLY): 0735 SLP Stop Time (ACUTE ONLY): 0750 SLP Time Calculation (min) (ACUTE ONLY): 15 min  Past Medical History:  Past Medical History:  Diagnosis Date  . Anxiety   . Bradycardia   . CHF (congestive heart failure) ([place])   . Chronic kidney disease    CKD Stage 3  . Depression   . Diabetes mellitus without complication ([place])   . Hyperkalemia   . Hypertension   . Lupus   . Peripheral neuropathy   . Rheumatoid arthritis ([place])   . Seizures ([place])   . Shortness of breath dyspnea    Past Surgical History:  Past Surgical History:  Procedure Laterality Date  . ABDOMINAL HYSTERECTOMY    . APPENDECTOMY    . CARDIAC CATHETERIZATION N/A 05/18/2016   Procedure: Right Heart Cath;  Surgeon: Burnell Blanks, MD;  Location: [facility];  Service: Cardiovascular;  Laterality: N/A;  . CYST EXCISION    . 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 Upon presentation to the emergency department family privately told staff that the patient has been having visual hallucinations about her spouse who passed away last 07/07/2022. The patient is often emotional and somewhat needy but she has become overly emotional and been crying calling her daughter multiple times. She woke her daughter in the middle the night twice last night. She has become increasingly short of breath having bilateral leg swelling with exertional  dyspnea with minimal activity. She has 3+ bilateral lower extremity edema. [place] recently on her front porch when bending over trying to feed the birds outside. Her dyspnea is worse with lying down. She has no chest pain. She has paroxysmal nocturnal dyspnea and wakes up at night, she has had some confusion. Patient's signs and symptoms are very vague and unclear and do not add up to any particular diagnosis. She will be admitted to the hospital for further diagnostic testing, evaluation and management of her apnea, edema, renal failure and a behavioral health evaluation.  Head CT with no acute findings.  CHest xray is showing mild bronchitic changes and right basilar atelectasis.     Assessment / Plan / Recommendation Clinical Impression  Clinical swallowing evaluation was completed using thin liquids, pureed material and dry solids.  Oral mechanism exam was completed and unremarkable.  The patient has no reports of trouble swallowing.  Patient is edentulous and wears upper and lower dentures that are poorly fitting unless denture adhesive is used.  They were placed for evaluation without the use of adhesive.  The patient presented with a mild oral dysphagia characterized by decreased mastication and oral residue that was most likely impacted by her poorly fitting dentures.  Swallow trigger appeared to be timely and hyo-laryngeal excursion was appreciated to palpation.  No overt s/s of aspiration were seen. In addition, the patient passed the [facility] Swallowing Protocol suggesting the low likelihood for aspiration.  Recommend a regular diet with thin liquids.  ST follow up is not indicated.   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 Upon presentation to the emergency department family privately told staff that the patient has been having visual hallucinations about her spouse who passed away last 07-05-22. The patient is often emotional and somewhat needy but she has become overly emotional and been crying calling her daughter multiple times. She woke her daughter in the middle the night twice last night. She has become increasingly short of breath having bilateral leg swelling with exertional dyspnea with minimal activity. She has 3+ bilateral lower extremity edema. [place] recently on her front porch when bending over trying to feed the birds outside. Her dyspnea is worse with lying down. She has no chest pain. She has paroxysmal nocturnal dyspnea and wakes up at night, she has had some confusion. Patient's signs and symptoms are very vague and unclear and do not add up to any particular diagnosis. She will be admitted to the hospital for further diagnostic testing, evaluation and management of her apnea, edema, renal failure and a behavioral health evaluation.  Head CT with no acute findings.  CHest xray is showing mild bronchitic changes and right basilar atelectasis.   Type of Study: Bedside Swallow Evaluation Previous Swallow Assessment: 01/24/2015 with rx for a regular diet/thin liquids Diet Prior to this Study: Regular;Thin liquids Temperature Spikes Noted: No History of Recent Intubation: No Behavior/Cognition: Alert;Cooperative;Pleasant  mood Oral Cavity Assessment: Within Functional Limits Oral Care Completed by SLP: No Oral Cavity - Dentition: Edentulous;Dentures, bottom;Dentures, top Vision: Functional for self-feeding Self-Feeding Abilities: Able to feed self Patient Positioning: Upright in bed Baseline Vocal Quality: Normal Volitional Cough: Strong Volitional Swallow: Able to elicit    Oral/Motor/Sensory Function Overall Oral Motor/Sensory Function: Within functional limits   Ice Chips Ice chips: Not tested   Thin Liquid Thin Liquid: Within functional limits Presentation: Cup;Self Fed;Straw    Nectar Thick Nectar Thick Liquid: Not tested   Honey Thick Honey Thick Liquid: Not tested   Puree Puree: Within functional limits Presentation: Spoon;Self Fed   Solid   GO   Solid: Impaired Presentation: Self Fed Oral Phase Impairments: Impaired mastication Oral Phase Functional Implications: Prolonged oral transit;Oral residue       Shelly Flatten, MA, CCC-SLP Acute Rehab SLP [phone] Lamar Sprinkles 06/11/2017,8:22 AM

## 2017-06-11 NOTE — Progress Notes (Signed)
Unable to pull adjusted 10am meds due to need for pharmacy review; pt gone to nuclear medicine before pyxis would allow pull of these meds.

## 2017-06-11 NOTE — Progress Notes (Signed)
Patient ID: Stacy Smith, female   DOB: [birth year], 72 y.o.   MRN: [id number]     Advanced Heart Failure Rounding Note   HF Cardiology: Aundra Dubin   Subjective:    Some diuresis yesterday.  Still has swollen legs.  Still short of breath with exertion. Creatinine 2.4 => 2.3.   RHC (1/18): mean RA 8, PA 78/25 mean 45, mean PCWP 18, CI 3.6, PVR 4.6 WU Echo (9/18): EF 60-65%, normal RV systolic function, PASP 58, mild TR and MR.   Objective:   Weight Range: 168 lb 6.9 oz (76.4 kg) Body mass index is 32.89 kg/m.   Vital Signs:   Temp:  [97.6 F (36.4 C)-98.5 F (36.9 C)] 98.5 F (36.9 C) (01/27 0521) Pulse Rate:  [59-76] 76 (01/27 0521) Resp:  [12-20] 18 (01/27 0521) BP: (130-151)/(37-66) 149/46 (01/27 0521) SpO2:  [92 %-98 %] 95 % (01/27 0521) FiO2 (%):  [21 %] 21 % (01/26 1611) Weight:  [168 lb 6.9 oz (76.4 kg)-180 lb 12.4 oz (82 kg)] 168 lb 6.9 oz (76.4 kg) (01/27 0521) Last BM Date: 06/10/17  Weight change: Filed Weights   06/10/17 1755 06/11/17 0521  Weight: 180 lb 12.4 oz (82 kg) 168 lb 6.9 oz (76.4 kg)    Intake/Output:   Intake/Output Summary (Last 24 hours) at 06/11/2017 1007 Last data filed at 06/11/2017 0500 Gross per 24 hour  Intake 600 ml  Output 1700 ml  Net -1100 ml      Physical Exam    General:  Well appearing. No resp difficulty HEENT: Normal Neck: Supple. JVP 14+ cm. Carotids 2+ bilat; no bruits. No lymphadenopathy or thyromegaly appreciated. Cor: PMI nondisplaced. Regular rate & rhythm. No rubs, gallops.  2/6 HSM LLSB.  Lungs: Clear Abdomen: Soft, nontender, nondistended. No hepatosplenomegaly. No bruits or masses. Good bowel sounds. Extremities: No cyanosis, clubbing, rash.  2+ edema to knees.  Neuro: Alert & orientedx3, cranial nerves grossly intact. moves all 4 extremities w/o difficulty. Affect pleasant   Telemetry   NSR, personally reviewed  Labs    CBC Recent Labs    06/10/17 1631 06/11/17 0632  WBC 4.7 5.0  HGB 8.9* 9.7*  HCT  28.9* 31.5*  MCV 87.0 87.0  PLT 135* 948   Basic Metabolic Panel Recent Labs    06/10/17 1037 06/10/17 1631 06/11/17 0632  NA 129*  --  136  K 4.1  --  3.7  CL 97*  --  100*  CO2 18*  --  22  GLUCOSE 348*  --  102*  BUN 25*  --  23*  CREATININE 2.43* 2.50* 2.32*  CALCIUM 8.3*  --  8.8*   Liver Function Tests Recent Labs    06/10/17 1233  AST 25  ALT 13*  ALKPHOS 79  BILITOT 0.7  PROT 6.8  ALBUMIN 2.9*   No results for input(s): LIPASE, AMYLASE in the last 72 hours. Cardiac Enzymes No results for input(s): CKTOTAL, CKMB, CKMBINDEX, TROPONINI in the last 72 hours.  BNP: BNP (last 3 results) Recent Labs    06/10/17 1538 06/11/17 0632  BNP 394.7* 470.6*    ProBNP (last 3 results) No results for input(s): PROBNP in the last 8760 hours.   D-Dimer No results for input(s): DDIMER in the last 72 hours. Hemoglobin A1C Recent Labs    06/10/17 1631  HGBA1C 5.5   Fasting Lipid Panel No results for input(s): CHOL, HDL, LDLCALC, TRIG, CHOLHDL, LDLDIRECT in the last 72 hours. Thyroid Function Tests No results for  input(s): TSH, T4TOTAL, T3FREE, THYROIDAB in the last 72 hours.  Invalid input(s): FREET3  Other results:   Imaging    Dg Chest 2 View  Result Date: 06/10/2017 CLINICAL DATA:  Leg swelling and shortness of breath for 2 weeks, history CHF EXAM: CHEST  2 VIEW COMPARISON:  01/25/2015; interval chest radiographs listed on the time line from 2017 are not available for comparison FINDINGS: Upper normal size of cardiac silhouette. Mediastinal contours and pulmonary vascularity normal. Bronchitic changes with mild RIGHT basilar atelectasis. Lungs otherwise clear. No infiltrate, pleural effusion or pneumothorax. Bones appear demineralized. IMPRESSION: Mild bronchitic changes and RIGHT basilar atelectasis. Electronically Signed   By: Lavonia Dana M.D.   On: 06/10/2017 11:26   Ct Head Wo Contrast  Result Date: 06/10/2017 CLINICAL DATA:  Unexplained altered level  of consciousness, headache and dizziness since yesterday, history hypertension, seizures EXAM: CT HEAD WITHOUT CONTRAST TECHNIQUE: Contiguous axial images were obtained from the base of the skull through the vertex without intravenous contrast. Sagittal and coronal MPR images reconstructed from axial data set. COMPARISON:  01/22/2015 FINDINGS: Brain: Mild generalized atrophy. Normal ventricular morphology. No midline shift or mass effect. Old appearing BILATERAL basal ganglia lacunar infarcts. Mild small vessel chronic ischemic changes of deep cerebral white matter. No intracranial hemorrhage, mass lesion, or evidence of acute infarction. No extra-axial fluid collections. Vascular: Atherosclerotic calcification of internal carotid arteries and LEFT vertebral artery at skull base. Skull: Intact Sinuses/Orbits: Tiny mucosal retention cyst LEFT maxillary sinus. Otherwise clear. Other: N/A IMPRESSION: Atrophy with small vessel chronic ischemic changes of deep cerebral white matter. Old BILATERAL basal ganglia lacunar infarcts, though new on RIGHT since 2016. No acute intracranial abnormalities. Electronically Signed   By: Lavonia Dana M.D.   On: 06/10/2017 12:22   US Renal  Result Date: 06/10/2017 CLINICAL DATA:  Patient with renal failure. EXAM: RENAL / URINARY TRACT ULTRASOUND COMPLETE COMPARISON:  None. FINDINGS: Right Kidney: Length: 11.4 cm. Mild pelviectasis. Kidney is lobular in contour with increased cortical echogenicity. Left Kidney: Not adequately visualized and therefore not assessed. Bladder: Appears normal for degree of bladder distention. IMPRESSION: 1. Mild right pelviectasis. 2. Echogenic and lobular right kidney as can be seen with chronic medical renal disease. 3. The left kidney is not adequately visualized and therefore not assessed. Electronically Signed   By: Lovey Newcomer M.D.   On: 06/10/2017 21:24      Medications:     Scheduled Medications: . amLODipine  5 mg Oral BID  . aspirin EC   81 mg Oral Daily  . [START ON 06/12/2017] calcitRIOL  0.5 mcg Oral Once per day on Mon Fri  . cholecalciferol  1,000 Units Oral BID  . clonazePAM  0.5 mg Oral BID  . docusate sodium  200 mg Oral QHS  . enoxaparin (LOVENOX) injection  30 mg Subcutaneous Q24H  . ferrous sulfate  325 mg Oral BID WC  . furosemide  80 mg Intravenous BID  . hydrALAZINE  10 mg Oral BID  . insulin aspart  0-15 Units Subcutaneous TID WC  . insulin aspart  3 Units Subcutaneous TID WC  . insulin detemir  20 Units Subcutaneous BID  . potassium chloride  40 mEq Oral Once  . pravastatin  80 mg Oral QHS  . sertraline  100 mg Oral Daily  . sodium chloride flush  3 mL Intravenous Q12H  . traZODone  50 mg Oral QHS  . trimethoprim-polymyxin b  2 drop Both Eyes BID     Infusions:  PRN Medications:  acetaminophen **OR** acetaminophen, cloNIDine, ipratropium-albuterol, oxyCODONE    Patient Profile   72 yo with history of CKD stage IV, diastolic CHF, HTN, ?cirrhosis, pulmonary hypertension was admitted with acute on chronic diastolic CHF.   Assessment/Plan   1. Acute on chronic diastolic CHF: Echo 8/56 with EF 60-65%, per report RV systolic function normal.  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 She presented with dyspnea.  On exam today, she is volume overloaded with JVD and peripheral edema.  Some diuresis yesterday with IV Lasix.  - Echo pending today.  - Increase Lasix to 80 mg IV bid and replete K.  - RHC tomorrow to assess filling pressure and PA pressure. This presentation not consistent with ACS, will not do coronary angiography given elevated creatinine.  - Place ted hose.  2. 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  Echo pending today to assess RV.   - Repeat RHC tomorrow after some diuresis.  - V/Q scan to assess for chronic PE.  - Will send serologic  workup for pulmonary hypertension.  3. CKD: Stage IV.  Creatinine lower today at 2.3.  Follow closely with diuresis. Followed by nephrology in [place].  4. HTN: BP controlled on current meds.  5. ?Cirrhosis: Per history.  Would get RUQ [place] to reassess.   Length of Stay: 1  Loralie Champagne, MD  06/11/2017, 10:07 AM  Advanced Heart Failure Team Pager [phone] (M-F; 7a - 4p)  Please contact [facility] for night-coverage after hours (4p -7a ) and weekends on [email or web address]

## 2017-06-11 NOTE — Progress Notes (Signed)
PROGRESS NOTE    Stacy Smith  VOJ:[id number] DOB: 1945/09/27 DOA: 06/10/2017 PCP: Jacqualine Code, DO   Brief Narrative: Stacy Smith is a 72 y.o. 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 She presented with dyspnea and LE edema. She was found to be in acute heart failure.   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 (HCC) Active Problems:   Essential hypertension   Hyperlipidemia   Acute kidney injury superimposed on chronic kidney disease (HCC)   Diabetes type 2, uncontrolled (HCC)   Hyponatremia   Iron deficiency anemia due to chronic blood loss   Major depression, recurrent, chronic ([place])   Pulmonary HTN ([place])   Right heart failure Pulmonary hypertension -Cardiology recommendations: IV diuresis with lasix; RHC planned for 1/28; V/Q scan -Echocardiogram pending  Essential hypertension Stable -Continue amlodipine, hydralazine and clonidine prn  Acute kidney injury on CKD 3 Baseline appears to be around 1.8-1.9. 2.43 on admission. Improved slightly today. -Nephrology recommendations -Vitamin D, calcitriol  Hyperlipidemia -Continue pravastatin  Diabetes mellitus, type 2 Uncontrolled with hyperglycemia and kidney disease. -Continue SSI -Continue Levemir and mealtime coverage Novolog 3 units TID  Hyponatremia Resolved.  Normocytic anemia Likely related to chronic kidney disease. No previous iron panel. Possible this could be mixed. -Iron/TIBC/ferritin -Continue iron  Major depression Behavioral issues Per nurse, patient states she covers her wall to prevent a lady coming through the wall. Patient with an odd affect on examination. Appears to be  anxious. Psychiatry consulted on admission. Patient started on Klonopin -Psychiatry recommendations -Continue Klonopin and Zoloft  Rashes Chronic. Outpatient follow-up.   DVT prophylaxis: Lovenox Code Status: Full code Family Communication: None at bedside Disposition Plan: Pending cardiac workup   Consultants:   Cardiology  Nephrology  Psychiatry  Procedures:   None  Antimicrobials:  None    Subjective: Complaining about her rashes. No chest pain.  Objective: Vitals:   06/10/17 1947 06/11/17 0017 06/11/17 0521 06/11/17 1016  BP: (!) 137/56 (!) 131/37 (!) 149/46 (!) 166/56  Pulse: 61 66 76   Resp: 14 16 18    Temp: 97.6 F (36.4 C) 98.4 F (36.9 C) 98.5 F (36.9 C)   TempSrc: Axillary Oral Oral   SpO2: 96% 94% 95%   Weight:   76.4 kg (168 lb 6.9 oz)   Height:        Intake/Output Summary (Last 24 hours) at 06/11/2017 1111 Last data filed at 06/11/2017 0500 Gross per 24 hour  Intake 600 ml  Output 1700 ml  Net -1100 ml   Filed Weights   06/10/17 1755 06/11/17 0521  Weight: 82 kg (180 lb 12.4 oz) 76.4 kg (168 lb 6.9 oz)    Examination:  General exam: Appears calm and comfortable Respiratory system: Clear to auscultation. Respiratory effort normal. Cardiovascular system: S1 & S2 heard, RRR. Gastrointestinal system: Abdomen is nondistended, soft and nontender. No organomegaly or masses felt. Normal bowel sounds heard. Central nervous system: Alert and oriented. No focal neurological deficits. Extremities: Edema. No calf tenderness Skin: No cyanosis. No rashes Psychiatry: Judgement and insight appear normal. Mood & affect appropriate.     Data Reviewed: I have personally reviewed following labs and imaging studies  CBC: Recent Labs  Lab 06/10/17 1037 06/10/17 1631 06/11/17 9937  WBC 6.5 4.7 5.0  HGB 9.1* 8.9* 9.7*  HCT 29.8* 28.9* 31.5*  MCV 87.9 87.0 87.0  PLT 124* 135* 774   Basic Metabolic Panel: Recent Labs  Lab 06/10/17 1037  06/10/17 1631 06/11/17 0632  NA 129*  --  136  K 4.1  --  3.7  CL 97*  --  100*  CO2 18*  --  22  GLUCOSE 348*  --  102*  BUN 25*  --  23*  CREATININE 2.43* 2.50* 2.32*  CALCIUM 8.3*  --  8.8*   GFR: Estimated Creatinine Clearance: 20.3 mL/min (A) (by C-G formula based on SCr of 2.32 mg/dL (H)). Liver Function Tests: Recent Labs  Lab 06/10/17 1233  AST 25  ALT 13*  ALKPHOS 79  BILITOT 0.7  PROT 6.8  ALBUMIN 2.9*   No results for input(s): LIPASE, AMYLASE in the last 168 hours. Recent Labs  Lab 06/10/17 1233  AMMONIA 49*   Coagulation Profile: No results for input(s): INR, PROTIME in the last 168 hours. Cardiac Enzymes: No results for input(s): CKTOTAL, CKMB, CKMBINDEX, TROPONINI in the last 168 hours. BNP (last 3 results) No results for input(s): PROBNP in the last 8760 hours. HbA1C: Recent Labs    06/10/17 1631  HGBA1C 5.5   CBG: Recent Labs  Lab 06/10/17 1701 06/10/17 2201 06/11/17 0736  GLUCAP 188* 144* 103*   Lipid Profile: No results for input(s): CHOL, HDL, LDLCALC, TRIG, CHOLHDL, LDLDIRECT in the last 72 hours. Thyroid Function Tests: No results for input(s): TSH, T4TOTAL, FREET4, T3FREE, THYROIDAB in the last 72 hours. Anemia Panel: No results for input(s): VITAMINB12, FOLATE, FERRITIN, TIBC, IRON, RETICCTPCT in the last 72 hours. Sepsis Labs: No results for input(s): PROCALCITON, LATICACIDVEN in the last 168 hours.  No results found for this or any previous visit (from the past 240 hour(s)).       Radiology Studies: Dg Chest 2 View  Result Date: 06/10/2017 CLINICAL DATA:  Leg swelling and shortness of breath for 2 weeks, history CHF EXAM: CHEST  2 VIEW COMPARISON:  01/25/2015; interval chest radiographs listed on the time line from 2017 are not available for comparison FINDINGS: Upper normal size of cardiac silhouette. Mediastinal contours and pulmonary vascularity normal. Bronchitic changes with mild RIGHT basilar atelectasis. Lungs  otherwise clear. No infiltrate, pleural effusion or pneumothorax. Bones appear demineralized. IMPRESSION: Mild bronchitic changes and RIGHT basilar atelectasis. Electronically Signed   By: Lavonia Dana M.D.   On: 06/10/2017 11:26   Ct Head Wo Contrast  Result Date: 06/10/2017 CLINICAL DATA:  Unexplained altered level of consciousness, headache and dizziness since yesterday, history hypertension, seizures EXAM: CT HEAD WITHOUT CONTRAST TECHNIQUE: Contiguous axial images were obtained from the base of the skull through the vertex without intravenous contrast. Sagittal and coronal MPR images reconstructed from axial data set. COMPARISON:  01/22/2015 FINDINGS: Brain: Mild generalized atrophy. Normal ventricular morphology. No midline shift or mass effect. Old appearing BILATERAL basal ganglia lacunar infarcts. Mild small vessel chronic ischemic changes of deep cerebral white matter. No intracranial hemorrhage, mass lesion, or evidence of acute infarction. No extra-axial fluid collections. Vascular: Atherosclerotic calcification of internal carotid arteries and LEFT vertebral artery at skull base. Skull: Intact Sinuses/Orbits: Tiny mucosal retention cyst LEFT maxillary sinus. Otherwise clear. Other: N/A IMPRESSION: Atrophy with small vessel chronic ischemic changes of deep cerebral white matter. Old BILATERAL basal ganglia lacunar infarcts, though new on RIGHT since 2016. No acute intracranial abnormalities. Electronically Signed   By: Crist Infante.D.  On: 06/10/2017 12:22   US Renal  Result Date: 06/10/2017 CLINICAL DATA:  Patient with renal failure. EXAM: RENAL / URINARY TRACT ULTRASOUND COMPLETE COMPARISON:  None. FINDINGS: Right Kidney: Length: 11.4 cm. Mild pelviectasis. Kidney is lobular in contour with increased cortical echogenicity. Left Kidney: Not adequately visualized and therefore not assessed. Bladder: Appears normal for degree of bladder distention. IMPRESSION: 1. Mild right pelviectasis. 2.  Echogenic and lobular right kidney as can be seen with chronic medical renal disease. 3. The left kidney is not adequately visualized and therefore not assessed. Electronically Signed   By: Lovey Newcomer M.D.   On: 06/10/2017 21:24        Scheduled Meds: . amLODipine  5 mg Oral BID  . aspirin EC  81 mg Oral Daily  . [START ON 06/12/2017] calcitRIOL  0.5 mcg Oral Once per day on Mon Fri  . cholecalciferol  1,000 Units Oral BID  . clonazePAM  0.5 mg Oral BID  . docusate sodium  200 mg Oral QHS  . enoxaparin (LOVENOX) injection  30 mg Subcutaneous Q24H  . ferrous sulfate  325 mg Oral BID WC  . furosemide  80 mg Intravenous BID  . hydrALAZINE  10 mg Oral BID  . insulin aspart  0-15 Units Subcutaneous TID WC  . insulin aspart  3 Units Subcutaneous TID WC  . insulin detemir  20 Units Subcutaneous BID  . potassium chloride  40 mEq Oral Once  . pravastatin  80 mg Oral QHS  . sertraline  100 mg Oral Daily  . sodium chloride flush  3 mL Intravenous Q12H  . traZODone  50 mg Oral QHS  . trimethoprim-polymyxin b  2 drop Both Eyes BID   Continuous Infusions:   LOS: 1 day     Cordelia Poche, MD [facility] 06/11/2017, 11:11 AM Pager: [phone]  If 7PM-7AM, please contact night-coverage [email or web address] Password [email or web address] 06/11/2017, 11:11 AM

## 2017-06-11 NOTE — Evaluation (Signed)
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   Clinical Impression  Pt admitted with above diagnosis. Pt currently with functional limitations due to the deficits listed below (see PT Problem List). PT eval limited today due to pt's fatigue from testing earlier in day, pt also somewhat self limiting. Needing min A for bed mobility at this time. Spoke extensively about option of SNF but pt not agreeable at this point in time. Will recommend HHPT for now but may need to advance this to SNF once further testing completed. Encouraged pt to get OOB to chair with nursing this evening once she has rested.  Pt will benefit from skilled PT to increase their independence and safety with mobility to allow discharge to the venue listed below.       Follow Up Recommendations Home health PT    Equipment Recommendations  None recommended by PT    Recommendations for Other Services       Precautions / Restrictions Precautions Precautions: Fall Restrictions Weight Bearing Restrictions: No      Mobility  Bed Mobility Overal bed mobility: Needs Assistance Bed Mobility: Rolling Rolling: Min assist         General bed mobility comments: min A for pt to roll in bed as well as lift hips for placement of bed pan. Min A to scoot to [facility] and position self appropriately  Transfers                 General transfer comment: pt deferred mobility OOB due to fatigue from numerous tests this morning  Ambulation/Gait             General Gait Details:  deferred today  Stairs            Wheelchair Mobility    Modified Rankin (Stroke Patients Only)       Balance                                             Pertinent Vitals/Pain Pain Assessment: No/denies pain    Home Living Family/patient expects to be discharged to:: Private residence Living Arrangements: Alone Available Help at Discharge: Family;Available PRN/intermittently Type of Home: House Home Access: Ramped entrance     Home Layout: Laundry or work area in basement;Two level Home Equipment: Walker - 2 wheels;Bedside commode;Shower seat;Wheelchair - manual Additional Comments: pt does not go in basement. Her husband passed away a year ago. Daughter checks on her consistently and sister in law lives next door    Prior Function Level of Independence: Independent with assistive device(s)         Comments: uses RW     Hand Dominance   Dominant Hand: Right    Extremity/Trunk Assessment   Upper Extremity Assessment Upper Extremity Assessment: Generalized weakness    Lower Extremity Assessment Lower Extremity Assessment: Generalized weakness(swelling BLE's)    Cervical / Trunk Assessment Cervical / Trunk Assessment: Normal  Communication   Communication: No difficulties  Cognition Arousal/Alertness: Awake/alert Behavior During Therapy:  WFL for tasks assessed/performed Overall Cognitive Status: No family/caregiver present to determine baseline cognitive functioning Area of Impairment: Memory                     Memory: Decreased short-term memory         General Comments: repeats self several times throughout eval. Presents with depressed mood and somewhat self limiting      General Comments General comments (skin integrity, edema, etc.): discussed possibility of SNF and pt not open to this at this time    Exercises     Assessment/Plan    PT Assessment Patient needs continued PT services  PT Problem List  Decreased strength;Decreased activity tolerance;Decreased balance;Decreased mobility;Decreased cognition       PT Treatment Interventions DME instruction;Gait training;Functional mobility training;Therapeutic activities;Therapeutic exercise;Balance training;Patient/family education;Cognitive remediation    PT Goals (Current goals can be found in the Care Plan section)  Acute Rehab PT Goals Patient Stated Goal: return home PT Goal Formulation: With patient Time For Goal Achievement: 06/25/17 Potential to Achieve Goals: Fair    Frequency Min 3X/week   Barriers to discharge Decreased caregiver support lives alone    Co-evaluation               AM-PAC PT "6 Clicks" Daily Activity  Outcome Measure Difficulty turning over in bed (including adjusting bedclothes, sheets and blankets)?: A Little Difficulty moving from lying on back to sitting on the side of the bed? : A Little Difficulty sitting down on and standing up from a chair with arms (e.g., wheelchair, bedside commode, etc,.)?: Unable Help needed moving to and from a bed to chair (including a wheelchair)?: A Lot Help needed walking in hospital room?: A Lot Help needed climbing 3-5 steps with a railing? : Total 6 Click Score: 12    End of Session   Activity Tolerance: Patient limited by fatigue Patient left: in bed;with call bell/phone within reach Nurse Communication: Mobility status PT Visit Diagnosis: Muscle weakness (generalized) (M62.81)    Time: 7672-0947 PT Time Calculation (min) (ACUTE ONLY): 17 min   Charges:   PT Evaluation $PT Eval Moderate Complexity: 1 Mod     PT G Codes:        [place] 06/11/2017, 4:53 PM

## 2017-06-11 NOTE — Progress Notes (Signed)
  Echocardiogram 2D Echocardiogram has been performed.  Stacy Smith [name] 06/11/2017, 9:14 AM

## 2017-06-11 NOTE — Progress Notes (Signed)
PT Cancellation Note  Patient Details Name: Stacy Smith MRN: [id number] DOB: [birth year]   Cancelled Treatment:    Reason Eval/Treat Not Completed: Patient at procedure or test/unavailable all morning. Will check back later in day.   [place] 06/11/2017, 1:34 PM

## 2017-06-11 NOTE — H&P (View-Only) (Signed)
Patient ID: Stacy Smith, female   DOB: February 18, 1946, 72 y.o.   MRN: [id number]     Advanced Heart Failure Rounding Note   HF Cardiology: Aundra Dubin   Subjective:    Some diuresis yesterday.  Still has swollen legs.  Still short of breath with exertion. Creatinine 2.4 => 2.3.   RHC (1/18): mean RA 8, PA 78/25 mean 45, mean PCWP 18, CI 3.6, PVR 4.6 WU Echo (9/18): EF 60-65%, normal RV systolic function, PASP 58, mild TR and MR.   Objective:   Weight Range: 168 lb 6.9 oz (76.4 kg) Body mass index is 32.89 kg/m.   Vital Signs:   Temp:  [97.6 F (36.4 C)-98.5 F (36.9 C)] 98.5 F (36.9 C) (01/27 0521) Pulse Rate:  [59-76] 76 (01/27 0521) Resp:  [12-20] 18 (01/27 0521) BP: (130-151)/(37-66) 149/46 (01/27 0521) SpO2:  [92 %-98 %] 95 % (01/27 0521) FiO2 (%):  [21 %] 21 % (01/26 1611) Weight:  [168 lb 6.9 oz (76.4 kg)-180 lb 12.4 oz (82 kg)] 168 lb 6.9 oz (76.4 kg) (01/27 0521) Last BM Date: 06/10/17  Weight change: Filed Weights   06/10/17 1755 06/11/17 0521  Weight: 180 lb 12.4 oz (82 kg) 168 lb 6.9 oz (76.4 kg)    Intake/Output:   Intake/Output Summary (Last 24 hours) at 06/11/2017 1007 Last data filed at 06/11/2017 0500 Gross per 24 hour  Intake 600 ml  Output 1700 ml  Net -1100 ml      Physical Exam    General:  Well appearing. No resp difficulty HEENT: Normal Neck: Supple. JVP 14+ cm. Carotids 2+ bilat; no bruits. No lymphadenopathy or thyromegaly appreciated. Cor: PMI nondisplaced. Regular rate & rhythm. No rubs, gallops.  2/6 HSM LLSB.  Lungs: Clear Abdomen: Soft, nontender, nondistended. No hepatosplenomegaly. No bruits or masses. Good bowel sounds. Extremities: No cyanosis, clubbing, rash.  2+ edema to knees.  Neuro: Alert & orientedx3, cranial nerves grossly intact. moves all 4 extremities w/o difficulty. Affect pleasant   Telemetry   NSR, personally reviewed  Labs    CBC Recent Labs    06/10/17 1631 06/11/17 0632  WBC 4.7 5.0  HGB 8.9* 9.7*  HCT  28.9* 31.5*  MCV 87.0 87.0  PLT 135* 998   Basic Metabolic Panel Recent Labs    06/10/17 1037 06/10/17 1631 06/11/17 0632  NA 129*  --  136  K 4.1  --  3.7  CL 97*  --  100*  CO2 18*  --  22  GLUCOSE 348*  --  102*  BUN 25*  --  23*  CREATININE 2.43* 2.50* 2.32*  CALCIUM 8.3*  --  8.8*   Liver Function Tests Recent Labs    06/10/17 1233  AST 25  ALT 13*  ALKPHOS 79  BILITOT 0.7  PROT 6.8  ALBUMIN 2.9*   No results for input(s): LIPASE, AMYLASE in the last 72 hours. Cardiac Enzymes No results for input(s): CKTOTAL, CKMB, CKMBINDEX, TROPONINI in the last 72 hours.  BNP: BNP (last 3 results) Recent Labs    06/10/17 1538 06/11/17 0632  BNP 394.7* 470.6*    ProBNP (last 3 results) No results for input(s): PROBNP in the last 8760 hours.   D-Dimer No results for input(s): DDIMER in the last 72 hours. Hemoglobin A1C Recent Labs    06/10/17 1631  HGBA1C 5.5   Fasting Lipid Panel No results for input(s): CHOL, HDL, LDLCALC, TRIG, CHOLHDL, LDLDIRECT in the last 72 hours. Thyroid Function Tests No results for  input(s): TSH, T4TOTAL, T3FREE, THYROIDAB in the last 72 hours.  Invalid input(s): FREET3  Other results:   Imaging    Dg Chest 2 View  Result Date: 06/10/2017 CLINICAL DATA:  Leg swelling and shortness of breath for 2 weeks, history CHF EXAM: CHEST  2 VIEW COMPARISON:  01/25/2015; interval chest radiographs listed on the time line from 2017 are not available for comparison FINDINGS: Upper normal size of cardiac silhouette. Mediastinal contours and pulmonary vascularity normal. Bronchitic changes with mild RIGHT basilar atelectasis. Lungs otherwise clear. No infiltrate, pleural effusion or pneumothorax. Bones appear demineralized. IMPRESSION: Mild bronchitic changes and RIGHT basilar atelectasis. Electronically Signed   By: Lavonia Dana M.D.   On: 06/10/2017 11:26   Ct Head Wo Contrast  Result Date: 06/10/2017 CLINICAL DATA:  Unexplained altered level  of consciousness, headache and dizziness since yesterday, history hypertension, seizures EXAM: CT HEAD WITHOUT CONTRAST TECHNIQUE: Contiguous axial images were obtained from the base of the skull through the vertex without intravenous contrast. Sagittal and coronal MPR images reconstructed from axial data set. COMPARISON:  01/22/2015 FINDINGS: Brain: Mild generalized atrophy. Normal ventricular morphology. No midline shift or mass effect. Old appearing BILATERAL basal ganglia lacunar infarcts. Mild small vessel chronic ischemic changes of deep cerebral white matter. No intracranial hemorrhage, mass lesion, or evidence of acute infarction. No extra-axial fluid collections. Vascular: Atherosclerotic calcification of internal carotid arteries and LEFT vertebral artery at skull base. Skull: Intact Sinuses/Orbits: Tiny mucosal retention cyst LEFT maxillary sinus. Otherwise clear. Other: N/A IMPRESSION: Atrophy with small vessel chronic ischemic changes of deep cerebral white matter. Old BILATERAL basal ganglia lacunar infarcts, though new on RIGHT since 2016. No acute intracranial abnormalities. Electronically Signed   By: Lavonia Dana M.D.   On: 06/10/2017 12:22   US Renal  Result Date: 06/10/2017 CLINICAL DATA:  Patient with renal failure. EXAM: RENAL / URINARY TRACT ULTRASOUND COMPLETE COMPARISON:  None. FINDINGS: Right Kidney: Length: 11.4 cm. Mild pelviectasis. Kidney is lobular in contour with increased cortical echogenicity. Left Kidney: Not adequately visualized and therefore not assessed. Bladder: Appears normal for degree of bladder distention. IMPRESSION: 1. Mild right pelviectasis. 2. Echogenic and lobular right kidney as can be seen with chronic medical renal disease. 3. The left kidney is not adequately visualized and therefore not assessed. Electronically Signed   By: Lovey Newcomer M.D.   On: 06/10/2017 21:24      Medications:     Scheduled Medications: . amLODipine  5 mg Oral BID  . aspirin EC   81 mg Oral Daily  . [START ON 06/12/2017] calcitRIOL  0.5 mcg Oral Once per day on Mon Fri  . cholecalciferol  1,000 Units Oral BID  . clonazePAM  0.5 mg Oral BID  . docusate sodium  200 mg Oral QHS  . enoxaparin (LOVENOX) injection  30 mg Subcutaneous Q24H  . ferrous sulfate  325 mg Oral BID WC  . furosemide  80 mg Intravenous BID  . hydrALAZINE  10 mg Oral BID  . insulin aspart  0-15 Units Subcutaneous TID WC  . insulin aspart  3 Units Subcutaneous TID WC  . insulin detemir  20 Units Subcutaneous BID  . potassium chloride  40 mEq Oral Once  . pravastatin  80 mg Oral QHS  . sertraline  100 mg Oral Daily  . sodium chloride flush  3 mL Intravenous Q12H  . traZODone  50 mg Oral QHS  . trimethoprim-polymyxin b  2 drop Both Eyes BID     Infusions:  PRN Medications:  acetaminophen **OR** acetaminophen, cloNIDine, ipratropium-albuterol, oxyCODONE    Patient Profile   72 yo with history of CKD stage IV, diastolic CHF, HTN, ?cirrhosis, pulmonary hypertension was admitted with acute on chronic diastolic CHF.   Assessment/Plan   1. Acute on chronic diastolic CHF: Echo 0/07 with EF 60-65%, per report RV systolic function normal.  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 She presented with dyspnea.  On exam today, she is volume overloaded with JVD and peripheral edema.  Some diuresis yesterday with IV Lasix.  - Echo pending today.  - Increase Lasix to 80 mg IV bid and replete K.  - RHC tomorrow to assess filling pressure and PA pressure. This presentation not consistent with ACS, will not do coronary angiography given elevated creatinine.  - Place ted hose.  2. 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  Echo pending today to assess RV.   - Repeat RHC tomorrow after some diuresis.  - V/Q scan to assess for chronic PE.  - Will send serologic  workup for pulmonary hypertension.  3. CKD: Stage IV.  Creatinine lower today at 2.3.  Follow closely with diuresis. Followed by nephrology in [place].  4. HTN: BP controlled on current meds.  5. ?Cirrhosis: Per history.  Would get RUQ [place] to reassess.   Length of Stay: 1  Loralie Champagne, MD  06/11/2017, 10:07 AM  Advanced Heart Failure Team Pager [phone] (M-F; 7a - 4p)  Please contact [facility] for night-coverage after hours (4p -7a ) and weekends on [email or web address]

## 2017-06-11 NOTE — Plan of Care (Signed)
  Progressing Safety: Ability to remain free from injury will improve 06/11/2017 0452 - Progressing by Ardine Eng, RN

## 2017-06-11 NOTE — Consult Note (Signed)
[facility] Nurse wound consult note Reason for Consult: Several full and partial thickness wounds, circular in presentation, to feet, LEs, to patient's left shoulder, ears and on scalp. Patient states that some of the lesions have been present for approximately 2 years. States that they first started appearing post hospitalization. Two lesions on plantar aspect of bilateral feet at 1st metatarsal head appear neuropathic in origin. Wound type: Idiopathic Pressure Injury POA: NA Measurement:Several lesions, largest measures 0.6cm round x 0.2cm. Wound bed: varying stages of wound healing, some with dried serum (scabbed) and others with red, moist wound bed. Patient scratching and picking at wounds on left shoulder during my assessment with long fingernails and poor nail hygiene. Drainage (amount, consistency, odor) No drainage Periwound: clear, dry Dressing procedure/placement/frequency: The definitive diagnosis of these lesions exceeds the scope of an [facility] WOC nurse.  Suggest follow up with Dermatology post discharge for biopsy if etiology is not readily apparent. No topical therapy recommendations are made at this time. If you agree with dermatology post discharge, please order/arrange referral.  [facility] nursing team will not follow, but will remain available to this patient, the nursing and medical teams.  Please re-consult if needed. Thanks, Maudie Flakes, MSN, RN, [place], Arther Abbott  Pager# [phone]

## 2017-06-11 NOTE — Evaluation (Signed)
Speech Language Pathology Evaluation Patient Details Name: Stacy Smith MRN: [id number] DOB: [birth year] Today's Date: 06/11/2017 Time: 7858-8502 SLP Time Calculation (min) (ACUTE ONLY): 21 min  Problem List:  Patient Active Problem List   Diagnosis Date Noted  . 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 Hyponatremia 06/10/2017  . Iron deficiency anemia due to chronic blood loss 06/10/2017  . Major depression, recurrent, chronic ([place]) 06/10/2017  . Pulmonary HTN ([place])   . Seizures ([place])   . Delirium   . Diabetes type 2, uncontrolled ([place])   . Demand ischemia ([place])   . Sinus tachycardia   . Ischemic chest pain   . Diabetes type 2, controlled ([place])   . Convulsion ([place])   . Altered mental state   . HLD (hyperlipidemia)   . Rheumatoid arthritis ([place])   . Lupus (systemic lupus erythematosus) ([place])   . Acute kidney injury superimposed on chronic kidney disease ([place])   . Hypokalemia   . Hypomagnesemia   . Convulsions/seizures ([place]) 01/22/2015  . Diabetes ([place]) 01/22/2015  . Essential hypertension 01/22/2015  . Hyperlipidemia 01/22/2015  . Seizure ([place]) 01/22/2015   Past Medical History:  Past Medical History:  Diagnosis Date  . Anxiety   . Bradycardia   . CHF (congestive heart failure) ([place])   . Chronic kidney disease    CKD Stage 3  . Depression   . Diabetes mellitus without complication ([place])   . Hyperkalemia   . Hypertension   . Lupus   . Peripheral neuropathy   . Rheumatoid arthritis ([place])   . Seizures ([place])   . Shortness of breath dyspnea    Past Surgical History:  Past Surgical History:  Procedure Laterality Date  . ABDOMINAL HYSTERECTOMY    . APPENDECTOMY    . CARDIAC CATHETERIZATION N/A 05/18/2016   Procedure: Right Heart Cath;  Surgeon: Burnell Blanks, MD;  Location: [facility];  Service: Cardiovascular;  Laterality: N/A;  . CYST EXCISION    . DENTAL SURGERY  01/18/15   HPI:  Stacy Smith a 72  y.o.femalewith medical history significantfor pulmonary hypertension with a right heart cath in January 2018(showing pulmonary artery pressure of 74), type 2 diabetes mellitus with renal insufficiency, essential hypertension, hyperlipidemia,chronickidney disease stage III, iron deficiency anemia who presents from home with complaints of shortness of breath and leg swelling over the past 2 weeks. Upon presentation to the emergency department family privately told staff that the patient has been having visual hallucinations about her spouse who passed away last 10-Jun-2022. The patient is often emotional and somewhat needy but she has become overly emotional and been crying calling her daughter multiple times. She woke her daughter in the middle the night twice last night. She has become increasingly short of breath having bilateral leg swelling with exertional dyspnea with minimal activity. She has 3+ bilateral lower extremity edema. [place] recently on her front porch when bending over trying to feed the birds outside. Her dyspnea is worse with lying down. She has no chest pain. She has paroxysmal nocturnal dyspnea and wakes up at night, she has had some confusion. Patient's signs and symptoms are very vague and unclear and do not add up to any particular diagnosis. She will be admitted to the hospital for further diagnostic testing, evaluation and management of her apnea, edema, renal failure and a behavioral health evaluation.  Head CT with no acute findings.  CHest xray is showing mild bronchitic changes and right basilar atelectasis.  Assessment / Plan / Recommendation Clinical Impression  Cognitive/linguistic and motor speech screen were completed.  The patient was alert and very talkative during evaluation.  The patient's speech was clear and easy to understand.  No discernible dysarthria.   The patient reported a seizure event approximately 2 years ago that left her with some memory issues.   She lives alone but has a daughter and nephew that live nearby that check on her.  In addition, her sister in law lives next door.  She achieved a score of 22/30 on the Mini Mental State Exam suggestive of a mild cognitive impairment.  Suspect that these issues are baseline.  She was oriented to person, place, time and situation.  She was able to immediately recall 3 novel words, name objects, follow 2 steps of a 3 step command, read/comprehend a sentence and write a short sentence.  Mild difficulty noted for delayed recall (i.e. 2/3 which improved to 3/3 for semantic cue), repetition of a short sentence and copying a design.  In addition, she struggled on the attention task but she did not appear to demonstrate any issues with her focus.  She was able to provide logical solutions to simple problems.  Suggest a home health ST evaluation for home safety and possible therapy to maximize functional status at home.  ST will not follow during acute stay.        SLP Assessment  SLP Visit Diagnosis: Other (comment)(Short Term Memory deficits)    Follow Up Recommendations  Home health SLP          SLP Evaluation Cognition  Overall Cognitive Status: No family/caregiver present to determine baseline cognitive functioning Arousal/Alertness: Awake/alert Orientation Level: Oriented X4 Attention: Focused Focused Attention: Appears intact Memory: Impaired Memory Impairment: Decreased recall of new information;Decreased short term memory Decreased Short Term Memory: Verbal basic;Verbal complex Awareness: Appears intact Problem Solving: Appears intact Safety/Judgment: Appears intact       Comprehension  Auditory Comprehension Overall Auditory Comprehension: Appears within functional limits for tasks assessed Commands: Within Functional Limits Conversation: Complex Reading Comprehension Reading Status: Within funtional limits    Expression Expression Primary Mode of Expression: Verbal Verbal  Expression Overall Verbal Expression: Appears within functional limits for tasks assessed Initiation: No impairment Automatic Speech: Name;Social Response Level of Generative/Spontaneous Verbalization: Conversation Repetition: No impairment Naming: No impairment Pragmatics: No impairment Non-Verbal Means of Communication: Not applicable Written Expression Dominant Hand: Right Written Expression: Within Functional Limits   Oral / Motor  Oral Motor/Sensory Function Overall Oral Motor/Sensory Function: Within functional limits Motor Speech Overall Motor Speech: Appears within functional limits for tasks assessed Respiration: Within functional limits Phonation: Normal Resonance: Within functional limits Articulation: Within functional limitis Intelligibility: Intelligible Motor Planning: Witnin functional limits Motor Speech Errors: Not applicable   GO                    Shelly Flatten, MA, CCC-SLP Acute Rehab SLP [phone] Lamar Sprinkles 06/11/2017, 8:32 AM

## 2017-06-11 NOTE — Progress Notes (Signed)
[facility] Progress Note  Subjective: feeling better, breathing improving.  1700 cc urine out on IV lasix.   Vitals:   06/10/17 1947 06/11/17 0017 06/11/17 0521 06/11/17 1016  BP: (!) 137/56 (!) 131/37 (!) 149/46 (!) 166/56  Pulse: 61 66 76   Resp: 14 16 18    Temp: 97.6 F (36.4 C) 98.4 F (36.9 C) 98.5 F (36.9 C)   TempSrc: Axillary Oral Oral   SpO2: 96% 94% 95%   Weight:   76.4 kg (168 lb 6.9 oz)   Height:        Inpatient medications: . amLODipine  5 mg Oral BID  . aspirin EC  81 mg Oral Daily  . [START ON 06/12/2017] calcitRIOL  0.5 mcg Oral Once per day on Mon Fri  . cholecalciferol  1,000 Units Oral BID  . clonazePAM  0.5 mg Oral BID  . docusate sodium  200 mg Oral QHS  . enoxaparin (LOVENOX) injection  30 mg Subcutaneous Q24H  . ferrous sulfate  325 mg Oral BID WC  . furosemide  80 mg Intravenous BID  . hydrALAZINE  10 mg Oral BID  . insulin aspart  0-15 Units Subcutaneous TID WC  . insulin aspart  3 Units Subcutaneous TID WC  . insulin detemir  20 Units Subcutaneous BID  . potassium chloride  40 mEq Oral Once  . pravastatin  80 mg Oral QHS  . sertraline  100 mg Oral Daily  . sodium chloride flush  3 mL Intravenous Q12H  . traZODone  50 mg Oral QHS  . trimethoprim-polymyxin b  2 drop Both Eyes BID    acetaminophen **OR** acetaminophen, cloNIDine, ipratropium-albuterol, oxyCODONE  Exam: Gen eldelry , frail, no distress, groggy No rash, cyanosis or gangrene Sclera anicteric, throat clear  No jvd or bruits Chest dec'd R base, o/w clear RRR no MRG Abd soft ntnd no mass or ascites +bs obese GU defer MS no joint effusions or deformity Ext 2-3+ pitting bilat LE edema from shins to waist, no wounds or ulcers Neuro is groggy, arousable, moves all ext  UA today > clear, 0-5 rbc/ wbc, prot 100.  CXR no edema.  CXR - clear Renal US > 1. Mild right pelviectasis.  2. Echogenic and lobular right kidney as can be seen with chronic medical renal  disease.  3. The left kidney is not adequately visualized and therefore not assessed.  Home meds: -norvasc 5 bid/ clonidine 0.2 qid prn/ lasix 20 qd/ hydralazine 10 bid/ demadex 5 qd -levemir insulin/ novolog flexpen -ecasa/ neurontin 100 tid/ oxy IR prn/ pravachol/ zoloft 100 qd/ trazodone 50 hs -vitamins/ prns/ duoneb/ colace  Date                Creat               eGFR Sept 2016        1.1- 1.4            43         Jan 2018         1.80                 25 Jun 10, 2017   2.50                 19   Impression: 1  CKD stage IV - bump in creat from 1 yr ago may be progression of CKD vs acute decline due to decomp HF.  CKD most likely  due to HTN > DM.  UA benign.  BP's good, no nsaid/ acei.  Vol overloaded, sig LE edema. Plan is diurese w/ IV lasix, cont 80 mg tid. Renal US showing only one visible kidney. Pt says her other kidney is "all shriveled up, like a raisin".  She is from [place] and has a "kidney doctor" there but sounds more like a urologist.  Will follow.  2  HTN stable on home bp meds x 3 3  DM on insulin 4  Depression/ anxiety 5  HL 6  Hx pulm HTN 7  RA/ hx lupus  Plan - as above   Kelly Splinter MD [name] pager [phone]   06/11/2017, 1:15 PM   Recent Labs  Lab 06/10/17 1037 06/10/17 1631 06/11/17 0632  NA 129*  --  136  K 4.1  --  3.7  CL 97*  --  100*  CO2 18*  --  22  GLUCOSE 348*  --  102*  BUN 25*  --  23*  CREATININE 2.43* 2.50* 2.32*  CALCIUM 8.3*  --  8.8*   Recent Labs  Lab 06/10/17 1233  AST 25  ALT 13*  ALKPHOS 79  BILITOT 0.7  PROT 6.8  ALBUMIN 2.9*   Recent Labs  Lab 06/10/17 1037 06/10/17 1631 06/11/17 0632  WBC 6.5 4.7 5.0  HGB 9.1* 8.9* 9.7*  HCT 29.8* 28.9* 31.5*  MCV 87.9 87.0 87.0  PLT 124* 135* 163   Iron/TIBC/Ferritin/ %Sat No results found for: IRON, TIBC, FERRITIN, IRONPCTSAT

## 2017-06-11 NOTE — Consult Note (Signed)
[facility] Consult   Reason for Consult:  Depression, prolong grief Referring Physician:  Dr. Lonny Prude Patient Identification: Stacy Smith MRN:  [id number] Principal Diagnosis: Major depression, recurrent, chronic ([place]) Diagnosis:   Patient Active Problem List   Diagnosis Date Noted  . 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 Hyponatremia [E87.1] 06/10/2017  . Iron deficiency anemia due to chronic blood loss [D50.0] 06/10/2017  . Major depression, recurrent, chronic ([place]) [F33.9] 06/10/2017  . Pulmonary HTN ([place]) [I27.20]   . Seizures ([place]) [R56.9]   . Delirium [R41.0]   . Diabetes type 2, uncontrolled ([place]) [E11.65]   . Demand ischemia ([place]) [I24.8]   . Sinus tachycardia [R00.0]   . Ischemic chest pain [I25.9]   . Diabetes type 2, controlled ([place]) [E11.9]   . Convulsion ([place]) [R56.9]   . Altered mental state [R41.82]   . HLD (hyperlipidemia) [E78.5]   . Rheumatoid arthritis ([place]) [M06.9]   . Lupus (systemic lupus erythematosus) ([place]) [M32.9]   . Acute kidney injury superimposed on chronic kidney disease ([place]) [N17.9, N18.9]   . Hypokalemia [E87.6]   . Hypomagnesemia [E83.42]   . Convulsions/seizures ([place]) [R56.9] 01/22/2015  . Diabetes ([place]) [E11.9] 01/22/2015  . Essential hypertension [I10] 01/22/2015  . Hyperlipidemia [E78.5] 01/22/2015  . Seizure ([place]) [R56.9] 01/22/2015    Total Time spent with patient: 45 minutes  Subjective:   Stacy Smith is a 72 y.o. 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 However, patient reports that she has been  feeling more depressed,  lonely, and having low energy level, lack motivation, difficulty sleeping despite taking her antidepressant. She attributed her symptoms to inability to stop thinking about her husband of 32 years who passed last year. Patient denies suicidal thoughts, psychosis or delusional thinking. She plan to schedule an appointment with Dr. Posey Pronto her psychiatrist in [place] after she is discharged.   Past Psychiatric History: as above  Risk to Self: Is patient at risk for suicide?: No Risk to Others:   Prior Inpatient Therapy:   Prior Outpatient Therapy:    Past Medical History:  Past Medical History:  Diagnosis Date  . Anxiety   . Bradycardia   . CHF (congestive heart failure) ([place])   . Chronic kidney disease    CKD Stage 3  . Depression   . Diabetes mellitus without complication ([place])   . Hyperkalemia   . Hypertension   . Lupus   . Peripheral neuropathy   . Rheumatoid arthritis ([place])   . Seizures ([place])   . Shortness of breath dyspnea     Past Surgical History:  Procedure Laterality Date  . ABDOMINAL HYSTERECTOMY    . APPENDECTOMY    . CARDIAC CATHETERIZATION N/A 05/18/2016   Procedure: Right Heart Cath;  Surgeon: Burnell Blanks, MD;  Location: [facility];  Service: Cardiovascular;  Laterality: N/A;  . CYST EXCISION    . DENTAL SURGERY  01/18/15   Family History:  Family History  Problem Relation Age of Onset  . Heart failure Mother   . Liver cancer Father   . Lung cancer Father   . Cancer Sister   . Heart murmur Brother  Family Psychiatric  History:  Social History:  Social History   Substance and Sexual Activity  Alcohol Use No  . Alcohol/week: 0.0 oz     Social History   Substance and Sexual Activity  Drug Use No    Social History   Socioeconomic History  . Marital status: Widowed    Spouse name: None  . Number of children: None  . Years of education: None  . Highest education level: None  Social Needs  . Financial  resource strain: None  . Food insecurity - worry: None  . Food insecurity - inability: None  . Transportation needs - medical: None  . Transportation needs - non-medical: None  Occupational History  . None  Tobacco Use  . Smoking status: Never Smoker  . Smokeless tobacco: Never Used  Substance and Sexual Activity  . Alcohol use: No    Alcohol/week: 0.0 oz  . Drug use: No  . Sexual activity: None  Other Topics Concern  . None  Social History Narrative  . None   Additional Social History:    Allergies:   Allergies  Allergen Reactions  . Phenytoin Sodium Extended Itching  . Latex Swelling    Labs:  Results for orders placed or performed during the hospital encounter of 06/10/17 (from the past 48 hour(s))  Basic metabolic panel     Status: Abnormal   Collection Time: 06/10/17 10:37 AM  Result Value Ref Range   Sodium 129 (L) 135 - 145 mmol/L   Potassium 4.1 3.5 - 5.1 mmol/L   Chloride 97 (L) 101 - 111 mmol/L   CO2 18 (L) 22 - 32 mmol/L   Glucose, Bld 348 (H) 65 - 99 mg/dL   BUN 25 (H) 6 - 20 mg/dL   Creatinine, Ser 2.43 (H) 0.44 - 1.00 mg/dL   Calcium 8.3 (L) 8.9 - 10.3 mg/dL   GFR calc non Af Amer 19 (L) >60 mL/min   GFR calc Af Amer 22 (L) >60 mL/min    Comment: (NOTE) The eGFR has been calculated using the CKD EPI equation. This calculation has not been validated in all clinical situations. eGFR's persistently <60 mL/min signify possible Chronic Kidney Disease.    Anion gap 14 5 - 15  CBC     Status: Abnormal   Collection Time: 06/10/17 10:37 AM  Result Value Ref Range   WBC 6.5 4.0 - 10.5 K/uL   RBC 3.39 (L) 3.87 - 5.11 MIL/uL   Hemoglobin 9.1 (L) 12.0 - 15.0 g/dL   HCT 29.8 (L) 36.0 - 46.0 %   MCV 87.9 78.0 - 100.0 fL   MCH 26.8 26.0 - 34.0 pg   MCHC 30.5 30.0 - 36.0 g/dL   RDW 16.4 (H) 11.5 - 15.5 %   Platelets 124 (L) 150 - 400 K/uL  I-stat troponin, ED     Status: None   Collection Time: 06/10/17 10:53 AM  Result Value Ref Range   Troponin i, poc  0.02 0.00 - 0.08 ng/mL   Comment 3            Comment: Due to the release kinetics of cTnI, a negative result within the first hours of the onset of symptoms does not rule out myocardial infarction with certainty. If myocardial infarction is still suspected, repeat the test at appropriate intervals.   Urine rapid drug screen (hosp performed)     Status: None   Collection Time: 06/10/17 11:20 AM  Result Value Ref Range   Opiates NONE DETECTED  NONE DETECTED   Cocaine NONE DETECTED NONE DETECTED   Benzodiazepines NONE DETECTED NONE DETECTED   Amphetamines NONE DETECTED NONE DETECTED   Tetrahydrocannabinol NONE DETECTED NONE DETECTED   Barbiturates NONE DETECTED NONE DETECTED    Comment: (NOTE) DRUG SCREEN FOR MEDICAL PURPOSES ONLY.  IF CONFIRMATION IS NEEDED FOR ANY PURPOSE, NOTIFY LAB WITHIN 5 DAYS. LOWEST DETECTABLE LIMITS FOR URINE DRUG SCREEN Drug Class                     Cutoff (ng/mL) Amphetamine and metabolites    1000 Barbiturate and metabolites    200 Benzodiazepine                 811 Tricyclics and metabolites     300 Opiates and metabolites        300 Cocaine and metabolites        300 THC                            50   Urinalysis, Routine w reflex microscopic     Status: Abnormal   Collection Time: 06/10/17 11:20 AM  Result Value Ref Range   Color, Urine YELLOW YELLOW   APPearance CLEAR CLEAR   Specific Gravity, Urine 1.010 1.005 - 1.030   pH 6.0 5.0 - 8.0   Glucose, UA 150 (A) NEGATIVE mg/dL   Hgb urine dipstick NEGATIVE NEGATIVE   Bilirubin Urine NEGATIVE NEGATIVE   Ketones, ur NEGATIVE NEGATIVE mg/dL   Protein, ur 100 (A) NEGATIVE mg/dL   Nitrite NEGATIVE NEGATIVE   Leukocytes, UA NEGATIVE NEGATIVE   RBC / HPF 0-5 0 - 5 RBC/hpf   WBC, UA 0-5 0 - 5 WBC/hpf   Bacteria, UA RARE (A) NONE SEEN   Squamous Epithelial / LPF 0-5 (A) NONE SEEN  Hepatic function panel     Status: Abnormal   Collection Time: 06/10/17 12:33 PM  Result Value Ref Range   Total  Protein 6.8 6.5 - 8.1 g/dL   Albumin 2.9 (L) 3.5 - 5.0 g/dL   AST 25 15 - 41 U/L   ALT 13 (L) 14 - 54 U/L   Alkaline Phosphatase 79 38 - 126 U/L   Total Bilirubin 0.7 0.3 - 1.2 mg/dL   Bilirubin, Direct 0.2 0.1 - 0.5 mg/dL   Indirect Bilirubin 0.5 0.3 - 0.9 mg/dL  Ammonia     Status: Abnormal   Collection Time: 06/10/17 12:33 PM  Result Value Ref Range   Ammonia 49 (H) 9 - 35 umol/L  Ethanol     Status: None   Collection Time: 06/10/17 12:33 PM  Result Value Ref Range   Alcohol, Ethyl (B) <10 <10 mg/dL    Comment:        LOWEST DETECTABLE LIMIT FOR SERUM ALCOHOL IS 10 mg/dL FOR MEDICAL PURPOSES ONLY   Brain natriuretic peptide     Status: Abnormal   Collection Time: 06/10/17  3:38 PM  Result Value Ref Range   B Natriuretic Peptide 394.7 (H) 0.0 - 100.0 pg/mL  CBC     Status: Abnormal   Collection Time: 06/10/17  4:31 PM  Result Value Ref Range   WBC 4.7 4.0 - 10.5 K/uL   RBC 3.32 (L) 3.87 - 5.11 MIL/uL   Hemoglobin 8.9 (L) 12.0 - 15.0 g/dL   HCT 28.9 (L) 36.0 - 46.0 %   MCV 87.0 78.0 - 100.0 fL   MCH 26.8 26.0 - 34.0  pg   MCHC 30.8 30.0 - 36.0 g/dL   RDW 16.2 (H) 11.5 - 15.5 %   Platelets 135 (L) 150 - 400 K/uL  Creatinine, serum     Status: Abnormal   Collection Time: 06/10/17  4:31 PM  Result Value Ref Range   Creatinine, Ser 2.50 (H) 0.44 - 1.00 mg/dL   GFR calc non Af Amer 18 (L) >60 mL/min   GFR calc Af Amer 21 (L) >60 mL/min    Comment: (NOTE) The eGFR has been calculated using the CKD EPI equation. This calculation has not been validated in all clinical situations. eGFR's persistently <60 mL/min signify possible Chronic Kidney Disease.   Hemoglobin A1c     Status: None   Collection Time: 06/10/17  4:31 PM  Result Value Ref Range   Hgb A1c MFr Bld 5.5 4.8 - 5.6 %    Comment: (NOTE) Pre diabetes:          5.7%-6.4% Diabetes:              >6.4% Glycemic control for   <7.0% adults with diabetes    Mean Plasma Glucose 111.15 mg/dL  CBG monitoring, ED      Status: Abnormal   Collection Time: 06/10/17  5:01 PM  Result Value Ref Range   Glucose-Capillary 188 (H) 65 - 99 mg/dL  Glucose, capillary     Status: Abnormal   Collection Time: 06/10/17 10:01 PM  Result Value Ref Range   Glucose-Capillary 144 (H) 65 - 99 mg/dL  Basic metabolic panel     Status: Abnormal   Collection Time: 06/11/17  6:32 AM  Result Value Ref Range   Sodium 136 135 - 145 mmol/L   Potassium 3.7 3.5 - 5.1 mmol/L   Chloride 100 (L) 101 - 111 mmol/L   CO2 22 22 - 32 mmol/L   Glucose, Bld 102 (H) 65 - 99 mg/dL   BUN 23 (H) 6 - 20 mg/dL   Creatinine, Ser 2.32 (H) 0.44 - 1.00 mg/dL   Calcium 8.8 (L) 8.9 - 10.3 mg/dL   GFR calc non Af Amer 20 (L) >60 mL/min   GFR calc Af Amer 23 (L) >60 mL/min    Comment: (NOTE) The eGFR has been calculated using the CKD EPI equation. This calculation has not been validated in all clinical situations. eGFR's persistently <60 mL/min signify possible Chronic Kidney Disease.    Anion gap 14 5 - 15  CBC     Status: Abnormal   Collection Time: 06/11/17  6:32 AM  Result Value Ref Range   WBC 5.0 4.0 - 10.5 K/uL   RBC 3.62 (L) 3.87 - 5.11 MIL/uL   Hemoglobin 9.7 (L) 12.0 - 15.0 g/dL   HCT 31.5 (L) 36.0 - 46.0 %   MCV 87.0 78.0 - 100.0 fL   MCH 26.8 26.0 - 34.0 pg   MCHC 30.8 30.0 - 36.0 g/dL   RDW 16.4 (H) 11.5 - 15.5 %   Platelets 163 150 - 400 K/uL  Brain natriuretic peptide     Status: Abnormal   Collection Time: 06/11/17  6:32 AM  Result Value Ref Range   B Natriuretic Peptide 470.6 (H) 0.0 - 100.0 pg/mL  Glucose, capillary     Status: Abnormal   Collection Time: 06/11/17  7:36 AM  Result Value Ref Range   Glucose-Capillary 103 (H) 65 - 99 mg/dL   Comment 1 Notify RN    Comment 2 Document in Chart   Ferritin  Status: None   Collection Time: 06/11/17 12:23 PM  Result Value Ref Range   Ferritin 122 11 - 307 ng/mL  Iron and TIBC     Status: Abnormal   Collection Time: 06/11/17 12:23 PM  Result Value Ref Range   Iron 41  28 - 170 ug/dL   TIBC 223 (L) 250 - 450 ug/dL   Saturation Ratios 18 10.4 - 31.8 %   UIBC 182 ug/dL    Current Facility-Administered Medications  Medication Dose Route Frequency Provider Last Rate Last Dose  . acetaminophen (TYLENOL) tablet 650 mg  650 mg Oral Q6H PRN Lady Deutscher, MD   650 mg at 06/11/17 0047   Or  . acetaminophen (TYLENOL) suppository 650 mg  650 mg Rectal Q6H PRN Lady Deutscher, MD      . amLODipine (NORVASC) tablet 5 mg  5 mg Oral BID Lady Deutscher, MD   5 mg at 06/11/17 1016  . aspirin EC tablet 81 mg  81 mg Oral Daily Lady Deutscher, MD   81 mg at 06/11/17 1009  . [START ON 06/12/2017] calcitRIOL (ROCALTROL) capsule 0.5 mcg  0.5 mcg Oral Once per day on Mon Fri Lady Deutscher, MD      . cholecalciferol (VITAMIN D) tablet 1,000 Units  1,000 Units Oral BID Lady Deutscher, MD   1,000 Units at 06/11/17 1010  . clonazePAM (KLONOPIN) tablet 0.5 mg  0.5 mg Oral BID Lady Deutscher, MD   0.5 mg at 06/11/17 1010  . cloNIDine (CATAPRES) tablet 0.2 mg  0.2 mg Oral QID PRN Lady Deutscher, MD      . docusate sodium (COLACE) capsule 200 mg  200 mg Oral QHS Lady Deutscher, MD   200 mg at 06/10/17 2143  . enoxaparin (LOVENOX) injection 30 mg  30 mg Subcutaneous Q24H Lady Deutscher, MD   30 mg at 06/10/17 2142  . ferrous sulfate tablet 325 mg  325 mg Oral BID WC Lady Deutscher, MD   325 mg at 06/11/17 1009  . furosemide (LASIX) injection 80 mg  80 mg Intravenous Q8H Roney Jaffe, MD      . hydrALAZINE (APRESOLINE) tablet 10 mg  10 mg Oral BID Lady Deutscher, MD   10 mg at 06/11/17 1016  . insulin aspart (novoLOG) injection 0-15 Units  0-15 Units Subcutaneous TID WC Lady Deutscher, MD   3 Units at 06/10/17 1839  . insulin aspart (novoLOG) injection 3 Units  3 Units Subcutaneous TID WC Lady Deutscher, MD   3 Units at 06/10/17 1840  . insulin detemir (LEVEMIR) injection 20 Units  20 Units Subcutaneous BID Lady Deutscher, MD    20 Units at 06/11/17 1010  . ipratropium-albuterol (DUONEB) 0.5-2.5 (3) MG/3ML nebulizer solution 3 mL  3 mL Nebulization Q6H PRN Lady Deutscher, MD      . oxyCODONE (Oxy IR/ROXICODONE) immediate release tablet 5 mg  5 mg Oral Q6H PRN Lady Deutscher, MD   5 mg at 06/11/17 0518  . potassium chloride SA (K-DUR,KLOR-CON) CR tablet 40 mEq  40 mEq Oral Once Larey Dresser, MD      . pravastatin (PRAVACHOL) tablet 80 mg  80 mg Oral QHS Lady Deutscher, MD   80 mg at 06/10/17 2144  . sertraline (ZOLOFT) tablet 100 mg  100 mg Oral Daily Lady Deutscher, MD   100 mg at 06/11/17 1010  . sodium chloride flush (NS) 0.9 % injection  3 mL  3 mL Intravenous Q12H Lady Deutscher, MD   3 mL at 06/11/17 1012  . traZODone (DESYREL) tablet 50 mg  50 mg Oral QHS Lady Deutscher, MD   50 mg at 06/10/17 2143  . trimethoprim-polymyxin b (POLYTRIM) ophthalmic solution 2 drop  2 drop Both Eyes BID Lady Deutscher, MD   2 drop at 06/11/17 1013    Musculoskeletal: Strength & Muscle Tone: within normal limits Gait & Station: unsteady Patient leans: Front  Psychiatric Specialty Exam: Physical Exam  Psychiatric: Her speech is normal. Judgment and thought content normal. Her affect is blunt. She is withdrawn. Cognition and memory are normal. She exhibits a depressed mood.    Review of Systems  Constitutional: Positive for malaise/fatigue.  HENT: Negative.   Eyes: Negative.   Respiratory: Negative.   Gastrointestinal: Negative.   Skin: Negative.   Neurological: Negative.   Psychiatric/Behavioral: Positive for depression. The patient has insomnia.     Blood pressure (!) 166/56, pulse 76, temperature 98.5 F (36.9 C), temperature source Oral, resp. rate 18, height 5' (1.524 m), weight 76.4 kg (168 lb 6.9 oz), SpO2 95 %.Body mass index is 32.89 kg/m.  General Appearance: Casual  Eye Contact:  Good  Speech:  Clear and Coherent  Volume:  Normal  Mood:  Dysphoric  Affect:  Constricted   Thought Process:  Coherent and Descriptions of Associations: Intact  Orientation:  Full (Time, Place, and Person)  Thought Content:  Logical  Suicidal Thoughts:  No  Homicidal Thoughts:  No  Memory:  Immediate;   Fair Recent;   Fair Remote;   Good  Judgement:  Intact  Insight:  Fair  Psychomotor Activity:  Psychomotor Retardation  Concentration:  Concentration: Fair and Attention Span: Fair  Recall:  Good  Fund of Knowledge:  Good  Language:  Good  Akathisia:  No  Handed:  Right  AIMS (if indicated):     Assets:  Communication Skills Desire for Improvement Social Support  ADL's:  Intact  Cognition:  WNL  Sleep:   poor     Treatment Plan Summary: Plan/Recomendation: -Continue Zoloft 100 mg daily for depression -Consider increasing Trazodone to 75 mg at bedtime for sleep.  Disposition: No evidence of imminent risk to self or others at present.   Patient does not meet criteria for psychiatric inpatient admission. Supportive therapy provided about ongoing stressors. Social worker to schedule patient with Dr. Posey Pronto in [place], [place] for medication management and therapy upon discharge  Corena Pilgrim, MD 06/11/2017 3:09 PM

## 2017-06-11 NOTE — Progress Notes (Signed)
OT Cancellation Note  Patient Details Name: Stacy Smith MRN: [id number] DOB: April 20, 1946   Cancelled Treatment:     PATIENT WAS OUT OF ROOM FOR TESTING.   Gavon Majano 06/11/2017, 12:19 PM

## 2017-06-12 ENCOUNTER — Encounter (HOSPITAL_COMMUNITY): Payer: Self-pay | Admitting: Cardiology

## 2017-06-12 ENCOUNTER — Encounter (HOSPITAL_COMMUNITY)
Admission: EM | Disposition: A | Discharge status: Home Health | Payer: Self-pay | Source: Home / Self Care | Attending: Internal Medicine

## 2017-06-12 DIAGNOSIS — I272 Pulmonary hypertension, unspecified: Secondary | ICD-10-CM

## 2017-06-12 DIAGNOSIS — I509 Heart failure, unspecified: Secondary | ICD-10-CM

## 2017-06-12 DIAGNOSIS — I5033 Acute on chronic diastolic (congestive) heart failure: Secondary | ICD-10-CM

## 2017-06-12 HISTORY — PX: RIGHT HEART CATH: CATH118263

## 2017-06-12 LAB — CBC
HCT: 31.7 % — ABNORMAL LOW (ref 36.0–46.0)
HCT: 33.3 % — ABNORMAL LOW (ref 36.0–46.0)
Hemoglobin: 9.8 g/dL — ABNORMAL LOW (ref 12.0–15.0)
Hemoglobin: 10.3 g/dL — ABNORMAL LOW (ref 12.0–15.0)
MCH: 27.3 pg (ref 26.0–34.0)
MCH: 27.3 pg (ref 26.0–34.0)
MCHC: 30.9 g/dL (ref 30.0–36.0)
MCHC: 30.9 g/dL (ref 30.0–36.0)
MCV: 88.3 fL (ref 78.0–100.0)
MCV: 88.3 fL (ref 78.0–100.0)
PLATELETS: 159 10*3/uL (ref 150–400)
PLATELETS: 175 10*3/uL (ref 150–400)
RBC: 3.59 MIL/uL — AB (ref 3.87–5.11)
RBC: 3.77 MIL/uL — ABNORMAL LOW (ref 3.87–5.11)
RDW: 16.4 % — AB (ref 11.5–15.5)
RDW: 16.5 % — ABNORMAL HIGH (ref 11.5–15.5)
WBC: 5.3 10*3/uL (ref 4.0–10.5)
WBC: 7 10*3/uL (ref 4.0–10.5)

## 2017-06-12 LAB — BASIC METABOLIC PANEL
Anion gap: 12 (ref 5–15)
BUN: 23 mg/dL — AB (ref 6–20)
CALCIUM: 8.8 mg/dL — AB (ref 8.9–10.3)
CO2: 24 mmol/L (ref 22–32)
CREATININE: 2.22 mg/dL — AB (ref 0.44–1.00)
Chloride: 105 mmol/L (ref 101–111)
GFR calc Af Amer: 24 mL/min — ABNORMAL LOW (ref >60)
GFR, EST NON AFRICAN AMERICAN: 21 mL/min — AB (ref >60)
Glucose, Bld: 125 mg/dL — ABNORMAL HIGH (ref 65–99)
POTASSIUM: 3.8 mmol/L (ref 3.5–5.1)
SODIUM: 141 mmol/L (ref 135–145)

## 2017-06-12 LAB — ANTI-JO 1 ANTIBODY, IGG

## 2017-06-12 LAB — POCT I-STAT 3, VENOUS BLOOD GAS (G3P V)
BICARBONATE: 25.3 mmol/L (ref 20.0–28.0)
O2 Saturation: 80 %
PH VEN: 7.356 (ref 7.250–7.430)
PO2 VEN: 47 mmHg — AB (ref 32.0–45.0)
TCO2: 27 mmol/L (ref 22–32)
pCO2, Ven: 45.3 mmHg (ref 44.0–60.0)

## 2017-06-12 LAB — ANA W/REFLEX IF POSITIVE: ANA: NEGATIVE

## 2017-06-12 LAB — GLUCOSE, CAPILLARY
Glucose-Capillary: 103 mg/dL — ABNORMAL HIGH (ref 65–99)
Glucose-Capillary: 155 mg/dL — ABNORMAL HIGH (ref 65–99)
Glucose-Capillary: 218 mg/dL — ABNORMAL HIGH (ref 65–99)
Glucose-Capillary: 192 mg/dL — ABNORMAL HIGH (ref 65–99)
Glucose-Capillary: 184 mg/dL — ABNORMAL HIGH (ref 65–99)

## 2017-06-12 LAB — CREATININE, SERUM
Creatinine, Ser: 2.35 mg/dL — ABNORMAL HIGH (ref 0.44–1.00)
GFR calc Af Amer: 23 mL/min — ABNORMAL LOW (ref >60)
GFR calc non Af Amer: 20 mL/min — ABNORMAL LOW (ref >60)

## 2017-06-12 LAB — ANTI-SCLERODERMA ANTIBODY: Scleroderma (Scl-70) (ENA) Antibody, IgG: <0.2 AI (ref 0.0–0.9)

## 2017-06-12 LAB — PROTIME-INR
INR: 1.18
PROTHROMBIN TIME: 14.9 s (ref 11.4–15.2)

## 2017-06-12 LAB — RHEUMATOID FACTOR: Rhuematoid fact SerPl-aCnc: <10 IU/mL (ref 0.0–13.9)

## 2017-06-12 SURGERY — RIGHT HEART CATH
Anesthesia: LOCAL

## 2017-06-12 MED ORDER — LIDOCAINE HCL (PF) 1 % IJ SOLN
INTRAMUSCULAR | Status: DC | PRN
  Administered 2017-06-12: 3 mL
  Administered 2017-06-12: 7 mL

## 2017-06-12 MED ORDER — ONDANSETRON HCL 4 MG/2ML IJ SOLN
4.0000 mg | Freq: Four times a day (QID) | INTRAMUSCULAR | Status: DC | PRN
  Administered 2017-06-12 – 2017-06-15 (×3): 4 mg via INTRAVENOUS
  Filled 2017-06-12 (×4): qty 2

## 2017-06-12 MED ORDER — HEPARIN (PORCINE) IN NACL 2-0.9 UNIT/ML-% IJ SOLN
INTRAMUSCULAR | Status: AC | PRN
  Administered 2017-06-12: 500 mL

## 2017-06-12 MED ORDER — SODIUM CHLORIDE 0.9% FLUSH
3.0000 mL | INTRAVENOUS | Status: DC | PRN
  Administered 2017-06-13: 3 mL via INTRAVENOUS
  Filled 2017-06-12: qty 3

## 2017-06-12 MED ORDER — MIDAZOLAM HCL 2 MG/2ML IJ SOLN
INTRAMUSCULAR | Status: DC | PRN
  Administered 2017-06-12 (×2): 1 mg via INTRAVENOUS

## 2017-06-12 MED ORDER — POTASSIUM CHLORIDE CRYS ER 20 MEQ PO TBCR
40.0000 meq | EXTENDED_RELEASE_TABLET | Freq: Once | ORAL | Status: AC
  Administered 2017-06-12: 40 meq via ORAL
  Filled 2017-06-12: qty 2

## 2017-06-12 MED ORDER — HEPARIN SODIUM (PORCINE) 5000 UNIT/ML IJ SOLN
5000.0000 [IU] | Freq: Three times a day (TID) | INTRAMUSCULAR | Status: DC
  Administered 2017-06-12 – 2017-06-14 (×5): 5000 [IU] via SUBCUTANEOUS
  Filled 2017-06-12 (×5): qty 1

## 2017-06-12 MED ORDER — SODIUM CHLORIDE 0.9 % IV SOLN
INTRAVENOUS | Status: AC | PRN
  Administered 2017-06-12: 10 mL/h via INTRAVENOUS

## 2017-06-12 MED ORDER — MIDAZOLAM HCL 2 MG/2ML IJ SOLN
INTRAMUSCULAR | Status: AC
  Filled 2017-06-12: qty 2

## 2017-06-12 MED ORDER — HEPARIN (PORCINE) IN NACL 2-0.9 UNIT/ML-% IJ SOLN
INTRAMUSCULAR | Status: AC
  Filled 2017-06-12: qty 500

## 2017-06-12 MED ORDER — SODIUM CHLORIDE 0.9 % IV SOLN
250.0000 mL | INTRAVENOUS | Status: DC | PRN
  Administered 2017-06-14: 250 mL via INTRAVENOUS

## 2017-06-12 MED ORDER — FUROSEMIDE 10 MG/ML IJ SOLN
80.0000 mg | Freq: Two times a day (BID) | INTRAMUSCULAR | Status: DC
  Administered 2017-06-12 – 2017-06-13 (×4): 80 mg via INTRAVENOUS
  Filled 2017-06-12 (×4): qty 8

## 2017-06-12 MED ORDER — HYDRALAZINE HCL 20 MG/ML IJ SOLN
10.0000 mg | Freq: Once | INTRAMUSCULAR | Status: AC
  Administered 2017-06-12: 10 mg via INTRAVENOUS

## 2017-06-12 MED ORDER — ACETAMINOPHEN 325 MG PO TABS
650.0000 mg | ORAL_TABLET | ORAL | Status: DC | PRN
  Administered 2017-06-16: 650 mg via ORAL
  Filled 2017-06-12: qty 2

## 2017-06-12 MED ORDER — SODIUM CHLORIDE 0.9% FLUSH
3.0000 mL | Freq: Two times a day (BID) | INTRAVENOUS | Status: DC
  Administered 2017-06-12 – 2017-06-18 (×11): 3 mL via INTRAVENOUS

## 2017-06-12 MED ORDER — HYDRALAZINE HCL 25 MG PO TABS
25.0000 mg | ORAL_TABLET | Freq: Three times a day (TID) | ORAL | Status: DC
  Administered 2017-06-12 – 2017-06-13 (×3): 25 mg via ORAL
  Filled 2017-06-12 (×3): qty 1

## 2017-06-12 MED ORDER — FENTANYL CITRATE (PF) 100 MCG/2ML IJ SOLN
INTRAMUSCULAR | Status: AC
Start: 2017-06-12 — End: ?
  Filled 2017-06-12: qty 2

## 2017-06-12 MED ORDER — LIDOCAINE HCL 1 % IJ SOLN
INTRAMUSCULAR | Status: AC
  Filled 2017-06-12: qty 20

## 2017-06-12 MED ORDER — FENTANYL CITRATE (PF) 100 MCG/2ML IJ SOLN
INTRAMUSCULAR | Status: DC | PRN
  Administered 2017-06-12 (×2): 25 ug via INTRAVENOUS

## 2017-06-12 MED ORDER — DIPHENHYDRAMINE HCL 50 MG/ML IJ SOLN
INTRAMUSCULAR | Status: DC | PRN
  Administered 2017-06-12: 25 mg via INTRAVENOUS

## 2017-06-12 MED ORDER — DIPHENHYDRAMINE HCL 50 MG/ML IJ SOLN
INTRAMUSCULAR | Status: AC
  Filled 2017-06-12: qty 1

## 2017-06-12 MED ORDER — HYDRALAZINE HCL 20 MG/ML IJ SOLN
INTRAMUSCULAR | Status: AC
  Filled 2017-06-12: qty 1

## 2017-06-12 SURGICAL SUPPLY — 9 items
CATH SWAN GANZ 7F STRAIGHT (CATHETERS) ×2
COVER PRB 48X5XTLSCP FOLD TPE (BAG) ×2
COVER PROBE 5X48 (BAG) ×2
PACK CARDIAC CATHETERIZATION (CUSTOM PROCEDURE TRAY) ×2
SHEATH GLIDE SLENDER 4/5FR (SHEATH) ×2
SHEATH PINNACLE 7F 10CM (SHEATH) ×2
TRANSDUCER W/STOPCOCK (MISCELLANEOUS) ×2
TUBING ART PRESS 72  MALE/FEM (TUBING) ×1
TUBING ART PRESS 72 MALE/FEM (TUBING) ×1

## 2017-06-12 NOTE — Progress Notes (Signed)
Patient to cath lab.  Right AC IV attempted by this Probation officer, IV team, Surveyor, quantity of [place] with no success.  Patient stable before transport.

## 2017-06-12 NOTE — Progress Notes (Addendum)
Patient ID: Stacy Smith, female   DOB: 1946/03/24, 72 y.o.   MRN: [id number]     Advanced Heart Failure Rounding Note   HF Cardiology: Aundra Dubin   Subjective:    Good diuresis yesterday.  Weight down another 2 lbs.  Breathing improving.   RHC (1/18): mean RA 8, PA 78/25 mean 45, mean PCWP 18, CI 3.6, PVR 4.6 WU Echo (9/18): EF 60-65%, normal RV systolic function, PASP 58, mild TR and MR.  Echo (1/19): EF 65-70%, mild aortic stenosis, mildly dilated RV normal systolic function, PASP 60 mmHg.  V/Q scan (1/19): No PE Abdominal US (1/19): Liver appears cirrhotic.   RHC Procedural Findings (06/12/17): Hemodynamics (mmHg) RA mean 13 RV 65/15 PA 61/27, mean 39 PCWP mean 20 Oxygen saturations: PA 80% AO 97% Cardiac Output (Fick) 10.1  Cardiac Index (Fick) 5.87 PVR 1.9 WU Cardiac Output (Thermo) 9.23 Cardiac Output (Thermo) 5.34 PVR 2 WU  Objective:   Weight Range: 166 lb (75.3 kg) Body mass index is 32.42 kg/m.   Vital Signs:   Temp:  [98.9 F (37.2 C)-99.5 F (37.5 C)] 99.5 F (37.5 C) (01/28 0537) Pulse Rate:  [92-95] 92 (01/28 0537) Resp:  [18] 18 (01/28 0537) BP: (166-192)/(56-64) 183/64 (01/28 0537) SpO2:  [94 %-96 %] 94 % (01/28 0537) Weight:  [166 lb (75.3 kg)] 166 lb (75.3 kg) (01/28 0537) Last BM Date: 06/10/17  Weight change: Filed Weights   06/10/17 1755 06/11/17 0521 06/12/17 0537  Weight: 180 lb 12.4 oz (82 kg) 168 lb 6.9 oz (76.4 kg) 166 lb (75.3 kg)    Intake/Output:   Intake/Output Summary (Last 24 hours) at 06/12/2017 0743 Last data filed at 06/12/2017 0540 Gross per 24 hour  Intake 240 ml  Output 2650 ml  Net -2410 ml      Physical Exam    General: NAD Neck: JVP 12 cm, no thyromegaly or thyroid nodule.  Lungs: Clear to auscultation bilaterally with normal respiratory effort. CV: Nondisplaced PMI.  Heart regular S1/S2, no S3/S4, no murmur.  1+ ankle edema. Abdomen: Soft, nontender, no hepatosplenomegaly, no distention.  Skin: Intact  without lesions or rashes.  Neurologic: Alert and oriented x 3.  Psych: Normal affect. Extremities: No clubbing or cyanosis.  HEENT: Normal.    Telemetry   NSR 90s, personally reviewed  Labs    CBC Recent Labs    06/11/17 0632 06/12/17 0523  WBC 5.0 5.3  HGB 9.7* 9.8*  HCT 31.5* 31.7*  MCV 87.0 88.3  PLT 163 300   Basic Metabolic Panel Recent Labs    06/11/17 0632 06/12/17 0523  NA 136 141  K 3.7 3.8  CL 100* 105  CO2 22 24  GLUCOSE 102* 125*  BUN 23* 23*  CREATININE 2.32* 2.22*  CALCIUM 8.8* 8.8*   Liver Function Tests Recent Labs    06/10/17 1233  AST 25  ALT 13*  ALKPHOS 79  BILITOT 0.7  PROT 6.8  ALBUMIN 2.9*   No results for input(s): LIPASE, AMYLASE in the last 72 hours. Cardiac Enzymes No results for input(s): CKTOTAL, CKMB, CKMBINDEX, TROPONINI in the last 72 hours.  BNP: BNP (last 3 results) Recent Labs    06/10/17 1538 06/11/17 0632  BNP 394.7* 470.6*    ProBNP (last 3 results) No results for input(s): PROBNP in the last 8760 hours.   D-Dimer No results for input(s): DDIMER in the last 72 hours. Hemoglobin A1C Recent Labs    06/10/17 1631  HGBA1C 5.5   Fasting Lipid  Panel No results for input(s): CHOL, HDL, LDLCALC, TRIG, CHOLHDL, LDLDIRECT in the last 72 hours. Thyroid Function Tests No results for input(s): TSH, T4TOTAL, T3FREE, THYROIDAB in the last 72 hours.  Invalid input(s): FREET3  Other results:   Imaging    Nm Pulmonary Perf And Vent  Result Date: 06/11/2017 CLINICAL DATA:  Pulmonary hypertension question chronic pulmonary embolism EXAM: NUCLEAR MEDICINE VENTILATION - PERFUSION LUNG SCAN TECHNIQUE: Ventilation images were obtained in multiple projections using inhaled aerosol Tc-58m DTPA. Perfusion images were obtained in multiple projections after intravenous injection of Tc-58m MAA. RADIOPHARMACEUTICALS:  31.7 mCi of Tc-13m DTPA aerosol inhalation and 4.38 mCi Tc46m MAA-IV COMPARISON:  None Correlation:  Chest radiograph 06/10/2017 FINDINGS: Ventilation: Central airway deposition of aerosol. Swallowed aerosol within stomach. No focal ventilatory defects. Perfusion: Normal IMPRESSION: Normal ventilation and perfusion lung scan. Electronically Signed   By: Lavonia Dana M.D.   On: 06/11/2017 14:02   US Abdomen Limited Ruq  Result Date: 06/11/2017 CLINICAL DATA:  History of cirrhosis. EXAM: ULTRASOUND ABDOMEN LIMITED RIGHT UPPER QUADRANT COMPARISON:  Abdominal ultrasound 10/31/2012. FINDINGS: Gallbladder: No gallstones or wall thickening visualized. No sonographic Murphy sign noted by sonographer. Common bile duct: Diameter: 0.2 cm Liver: No focal lesion. The liver border has a nodular appearance. Echotexture is coarsened. Portal vein is patent on color Doppler imaging with normal direction of blood flow towards the liver. IMPRESSION: Cirrhotic appearing liver.  No focal lesion.  No acute abnormality. Negative for gallstones. Electronically Signed   By: Inge Rise M.D.   On: 06/11/2017 14:49     Medications:     Scheduled Medications: . [MAR Hold] amLODipine  5 mg Oral BID  . [MAR Hold] aspirin EC  81 mg Oral Daily  . [MAR Hold] calcitRIOL  0.5 mcg Oral Once per day on Mon Fri  . [MAR Hold] cholecalciferol  1,000 Units Oral BID  . [MAR Hold] clonazePAM  0.5 mg Oral BID  . [MAR Hold] docusate sodium  200 mg Oral QHS  . [MAR Hold] enoxaparin (LOVENOX) injection  30 mg Subcutaneous Q24H  . [MAR Hold] ferrous sulfate  325 mg Oral BID WC  . [MAR Hold] furosemide  80 mg Intravenous Q8H  . [MAR Hold] hydrALAZINE  10 mg Oral BID  . [MAR Hold] insulin aspart  0-15 Units Subcutaneous TID WC  . [MAR Hold] insulin aspart  3 Units Subcutaneous TID WC  . [MAR Hold] insulin detemir  20 Units Subcutaneous BID  . [MAR Hold] pravastatin  80 mg Oral QHS  . [MAR Hold] sertraline  100 mg Oral Daily  . [MAR Hold] sodium chloride flush  3 mL Intravenous Q12H  . sodium chloride flush  3 mL Intravenous Q12H  .  [MAR Hold] traZODone  50 mg Oral QHS  . [MAR Hold] trimethoprim-polymyxin b  2 drop Both Eyes BID    Infusions: . sodium chloride    . sodium chloride 10 mL/hr at 06/12/17 0610  . sodium chloride 10 mL/hr (06/12/17 0738)  . heparin      PRN Medications: sodium chloride, sodium chloride, [MAR Hold] acetaminophen **OR** [MAR Hold] acetaminophen, [MAR Hold] cloNIDine, fentaNYL, heparin, [MAR Hold] ipratropium-albuterol, midazolam, [MAR Hold] oxyCODONE, sodium chloride flush    Patient Profile   72 yo with history of CKD stage IV, diastolic CHF, HTN, ?cirrhosis, pulmonary hypertension was admitted with acute on chronic diastolic CHF.   Assessment/Plan   1. Acute on chronic diastolic CHF: Echo 9/98 with EF 65-70%, mildly dilated RV.  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 RHC today (1/28), however, showed pulmonary venous hypertension and elevated filling pressures with PVR about 2 WU.  Good diuresis yesterday with IV Lasix. She remains volume overloaded by exam and RHC.  - Continue Lasix 80 mg IV bid.  - Place ted hose.  - She may benefit from Cardiomems as outpatient.  2. 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  Echo with mildly dilated RV.  V/Q scan with no evidence for chronic PE. RHC 1/19 (this admission), however, showed primarily pulmonary venous hypertension with PVR only 2 WU. - Treatment at this point will be diuretics rather than selective pulmonary vasodilators.    3. CKD: Stage IV.  Creatinine lower today at 2.2.  Follow closely with diuresis. Followed by nephrology in [place].  4. HTN: BP runs high, titrate up hydralazine.   5. Cirrhosis: RUQ [place] suggestive of cirrhosis.   Length of Stay: 2  Loralie Champagne, MD  06/12/2017, 7:43 AM  Advanced Heart Failure Team Pager [phone] (M-F; 7a - 4p)  Please contact [facility]  for night-coverage after hours (4p -7a ) and weekends on [email or web address]

## 2017-06-12 NOTE — Progress Notes (Signed)
7Fr right femoral venous sheath aspirated and pulled.  Manual pressure held for 71mins, hemostasis achieved.  Site level 0.  Tegaderm and gauze dressing applied over site.  Instructions given to pt, pt verbalizes understanding.    Bedrest begins at 09:15.

## 2017-06-12 NOTE — Progress Notes (Signed)
OT Cancellation Note  Patient Details Name: Stacy Smith MRN: [id number] DOB: [birth year]   Cancelled Treatment:    Reason Eval/Treat Not Completed: Patient at procedure or test/ unavailable(cath lab)  [place], OT/L  [phone] 06/12/2017 06/12/2017, 10:19 AM

## 2017-06-12 NOTE — Interval H&P Note (Signed)
History and Physical Interval Note:  06/12/2017 7:41 AM  Stacy Smith  has presented today for surgery, with the diagnosis of HF  The various methods of treatment have been discussed with the patient and family. After consideration of risks, benefits and other options for treatment, the patient has consented to  Procedure(s): RIGHT HEART CATH (N/A) as a surgical intervention .  The patient's history has been reviewed, patient examined, no change in status, stable for surgery.  I have reviewed the patient's chart and labs.  Questions were answered to the patient's satisfaction.     Jaiveon Suppes [employer]

## 2017-06-12 NOTE — Progress Notes (Signed)
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     PT Comments    Pt anxious and tearful about mobilizing, however, with encouragement, she is agreeable to participate with therapy. Pt ambulated 75 ft in hall with min guard for safety. She would benefit from continued skilled PT to increase functional independence and safety with mobility. Will continue to follow acutely.   Follow Up Recommendations  Home health PT     Equipment Recommendations  None recommended by PT    Recommendations for Other Services       Precautions / Restrictions Precautions Precautions: Fall Restrictions Weight Bearing Restrictions: No    Mobility  Bed Mobility Overal bed mobility: Needs Assistance Bed Mobility: Rolling;Sidelying to Sit   Sidelying to sit: Min assist       General bed mobility comments: in chair on arrival  Transfers Overall transfer level: Needs assistance Equipment used: Rolling walker (2 wheeled) Transfers: Sit to/from [facility] Sit to Stand: Min assist Stand pivot transfers: Min assist       General transfer comment: cueing for technique and min A to power up.  Ambulation/Gait Ambulation/Gait assistance: Min guard Ambulation Distance (Feet): 75 Feet Assistive device: Rolling walker (2 wheeled) Gait Pattern/deviations: Step-to pattern;Wide base of support Gait velocity: decreased Gait velocity interpretation: Below normal speed for age/gender General Gait Details: pt very axious about walking, however once up  pt ambulated with min guard for safety. No physical assist required   Stairs            Wheelchair Mobility    Modified Rankin (Stroke Patients Only)       Balance Overall balance assessment: Needs assistance   Sitting balance-Leahy Scale: Good     Standing balance support: Bilateral upper extremity supported Standing balance-Leahy Scale: Fair Standing balance comment: reliance on UE support                            Cognition Arousal/Alertness: Awake/alert Behavior During Therapy: Anxious Overall Cognitive Status: History of cognitive impairments - at baseline Area of Impairment: Memory                     Memory: Decreased short-term memory         General Comments: tangentail at times; tearful at times; talked about her father and husband who have passed away; states she is very religious      Exercises      General Comments        Pertinent Vitals/Pain Pain Assessment: Faces Faces Pain Scale: Hurts little more Pain Location: R groin; R upper chest; BLE; "everywher because of fibromyalgia" Pain Descriptors / Indicators: Aching;Discomfort Pain Intervention(s): Monitored during session;Limited activity within patient's tolerance    Home Living Family/patient expects to be discharged to:: Private residence Living Arrangements: Alone Available Help at Discharge: Family;Available PRN/intermittently Type of Home: House Home Access: Ramped entrance   Home Layout: Laundry or work area in basement;Two level Home Equipment: Walker - 2 wheels;Bedside commode;Shower seat;Wheelchair - manual Additional Comments: pt does not go in basement. Her husband  passed away a year ago. Daughter checks on her consistently and sister in law lives next door    Prior Function Level of Independence: Independent with assistive device(s)      Comments: uses RW   PT Goals (current goals can now be found in the care plan section) Acute Rehab PT  Goals Patient Stated Goal: return home to her pet rabbit PT Goal Formulation: With patient Time For Goal Achievement: 06/25/17 Potential to Achieve Goals: Fair Progress towards PT goals: Progressing toward goals    Frequency    Min 3X/week      PT Plan Current plan remains appropriate    Co-evaluation              AM-PAC PT "6 Clicks" Daily Activity  Outcome Measure  Difficulty turning over in bed (including adjusting bedclothes, sheets and blankets)?: A Little Difficulty moving from lying on back to sitting on the side of the bed? : A Little Difficulty sitting down on and standing up from a chair with arms (e.g., wheelchair, bedside commode, etc,.)?: Unable Help needed moving to and from a bed to chair (including a wheelchair)?: A Little Help needed walking in hospital room?: A Little Help needed climbing 3-5 steps with a railing? : A Lot 6 Click Score: 15    End of Session Equipment Utilized During Treatment: Gait belt Activity Tolerance: Patient tolerated treatment well Patient left: with call bell/phone within reach;in chair;with chair alarm set Nurse Communication: Mobility status PT Visit Diagnosis: Muscle weakness (generalized) (M62.81)     Time: 2440-1027 PT Time Calculation (min) (ACUTE ONLY): 22 min  Charges:  $Gait Training: 8-22 mins                    G Codes:       Benjiman Core, [place] Pager [phone] Acute Rehab   Allena Katz 06/12/2017, 4:01 PM

## 2017-06-12 NOTE — Progress Notes (Signed)
Visited with this patient and introduced myself to her. Patient says she just returned from a procedure this morning and would not like a visit today.  Patient thanked me for coming and stated she would love to talk but isn't feeling well.  She may would like a visit on another day. Please place a consult or page [place] as is necessary.    06/12/17 1140  Clinical Encounter Type  Visited With Patient  Visit Type Initial;Psychological support;Spiritual support

## 2017-06-12 NOTE — Progress Notes (Signed)
PROGRESS NOTE    Stacy Smith  BTD:[id number] DOB: 10-19-1945 DOA: 06/10/2017 PCP: Jacqualine Code, DO   Brief Narrative: Stacy Smith is a 72 y.o. 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 She presented with dyspnea and LE edema. She was found to be in acute heart failure.   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 RHC significant for moderate pulmonary venous hypertension and elevated R/L filling pressures. Transthoracic Echocardiogram significant for an EF of 65-70% and grade 1 diastolic dysfunction with elevated PA peak pressure of 60 mm Hg. Total I/O of -3.5 with ?14 lb weight loss from admission and 2 lb weight loss from yesterday. -Cardiology recommendations: Continue IV diuresis with lasix -daily weights -strict in/out  Essential hypertension Stable -Continue amlodipine, hydralazine and clonidine prn  Acute kidney injury on CKD 3 Baseline appears to be around 1.8-1.9. 2.43 on admission. Improved slightly today. -Nephrology recommendations -Vitamin D, calcitriol  Hyperlipidemia -Continue pravastatin  Diabetes mellitus, type 2 Uncontrolled with hyperglycemia and kidney disease. -Continue SSI -Continue Levemir and mealtime coverage Novolog 3 units TID  Hyponatremia Resolved.  Normocytic  anemia Iron panel significant for decreased TIBC only. Would favor chronic disease from kidney disease. -Continue outpatient iron  Major depression Behavioral issues Per nurse, patient states she covers her wall to prevent a lady coming through the wall. Patient with an odd affect on examination. Appears to be anxious. Psychiatry consulted on admission. Patient started on Klonopin -Psychiatry recommendations -Continue Klonopin and Zoloft  Rashes Chronic Outpatient follow-up. Stop scratching.  Cirrhosis As evidenced by RUQ ultrasound. INR and platelets stable. Ammonia slightly elevated at 49. Mental status not altered   DVT prophylaxis: Lovenox Code Status: Full code Family Communication: None at bedside Disposition Plan: Pending cardiac workup   Consultants:   Cardiology  Nephrology  Psychiatry  Procedures:   Transthoracic Echocardiogram (06/11/2017) Study Conclusions  - Left ventricle: The cavity size was normal. Wall thickness was   increased in a pattern of mild LVH. Systolic function was   vigorous. The estimated ejection fraction was in the range of 65%   to 70%. Wall motion was normal; there were no regional wall   motion abnormalities. Doppler parameters are consistent with   abnormal left ventricular relaxation (grade 1 diastolic   dysfunction). Doppler parameters are consistent with high   ventricular filling pressure. - Aortic valve: There was mild stenosis. - Mitral valve: Calcified annulus. - Left atrium: The atrium was mildly dilated. - Right ventricle: The cavity size was mildly dilated. - Right atrium: The atrium was mildly dilated. - Pulmonary arteries: Systolic pressure was moderately to severely   increased. PA peak pressure: 60 mm Hg (S).  Impressions:  - Vigorous LV systolic function; mild diastolic dysfunction with   elevated LV filling pressure; mild LVH; elevated LVOT gradient   (mean gradient 17 mmHg) at least partially explained by  vigorous  LV function; aortic valve not well visualized but appears to open   well; mild biatrial enlargement; mild RVE; mild TR with moderate   to severe pulmonary hypertension.   Right heart catheterization (06/12/2017)  Conclusion   1. Moderate pulmonary venous hypertension.  2. Elevated R > L heart filling pressures 3. Preserved cardiac output.    Antimicrobials:  None    Subjective: S/p RHC today.  Objective: Vitals:   06/12/17 0910 06/12/17 0915 06/12/17 0920 06/12/17 0959  BP: (!) 157/60 (!) 160/57 (!) 186/47 (!) 165/65  Pulse: 97 98 98 100  Resp: 13 12 13    Temp:      TempSrc:      SpO2: 91% 92% 91%   Weight:      Height:        Intake/Output Summary (Last 24 hours) at 06/12/2017 1035 Last data filed at 06/12/2017 0540 Gross per 24 hour  Intake 240 ml  Output 2650 ml  Net -2410 ml   Filed Weights   06/10/17 1755 06/11/17 0521 06/12/17 0537  Weight: 82 kg (180 lb 12.4 oz) 76.4 kg (168 lb 6.9 oz) 75.3 kg (166 lb)    Examination:  General exam: Appears calm and comfortable Respiratory system: Respiratory effort normal. Central nervous system: Sleeping   Data Reviewed: I have personally reviewed following labs and imaging studies  CBC: Recent Labs  Lab 06/10/17 1037 06/10/17 1631 06/11/17 0632 06/12/17 0523  WBC 6.5 4.7 5.0 5.3  HGB 9.1* 8.9* 9.7* 9.8*  HCT 29.8* 28.9* 31.5* 31.7*  MCV 87.9 87.0 87.0 88.3  PLT 124* 135* 163 094   Basic Metabolic Panel: Recent Labs  Lab 06/10/17 1037 06/10/17 1631 06/11/17 0632 06/12/17 0523  NA 129*  --  136 141  K 4.1  --  3.7 3.8  CL 97*  --  100* 105  CO2 18*  --  22 24  GLUCOSE 348*  --  102* 125*  BUN 25*  --  23* 23*  CREATININE 2.43* 2.50* 2.32* 2.22*  CALCIUM 8.3*  --  8.8* 8.8*   GFR: Estimated Creatinine Clearance: 21.1 mL/min (A) (by C-G formula based on SCr of 2.22 mg/dL (H)). Liver Function Tests: Recent Labs  Lab 06/10/17 1233  AST 25  ALT 13*  ALKPHOS 79  BILITOT 0.7    PROT 6.8  ALBUMIN 2.9*   No results for input(s): LIPASE, AMYLASE in the last 168 hours. Recent Labs  Lab 06/10/17 1233  AMMONIA 49*   Coagulation Profile: Recent Labs  Lab 06/12/17 0523  INR 1.18   Cardiac Enzymes: No results for input(s): CKTOTAL, CKMB, CKMBINDEX, TROPONINI in the last 168 hours. BNP (last 3 results) No results for input(s): PROBNP in the last 8760 hours. HbA1C: Recent Labs    06/10/17 1631  HGBA1C 5.5   CBG: Recent Labs  Lab 06/11/17 1524 06/11/17 1901 06/11/17 2135 06/11/17 2329 06/12/17 0846  GLUCAP 114* 99 84 103* 155*   Lipid Profile: No results for input(s): CHOL, HDL, LDLCALC, TRIG, CHOLHDL, LDLDIRECT in the last 72 hours. Thyroid Function Tests: No results for input(s): TSH, T4TOTAL, FREET4, T3FREE, THYROIDAB in the last 72 hours. Anemia Panel: Recent Labs    06/11/17 1223  FERRITIN 122  TIBC 223*  IRON 41   Sepsis Labs: No results for input(s): PROCALCITON, LATICACIDVEN in the last 168 hours.  No results found for this or any previous visit (from the past 240 hour(s)).       Radiology Studies: Dg Chest 2 View  Result Date: 06/10/2017 CLINICAL DATA:  Leg swelling and shortness of breath for 2 weeks, history CHF EXAM: CHEST  2 VIEW COMPARISON:  01/25/2015; interval chest radiographs listed on the time line from 2017 are not available for comparison FINDINGS: Upper normal size of cardiac silhouette. Mediastinal contours and pulmonary vascularity normal. Bronchitic changes with mild RIGHT basilar atelectasis. Lungs otherwise clear. No infiltrate, pleural effusion or pneumothorax. Bones appear demineralized. IMPRESSION: Mild bronchitic changes and RIGHT basilar atelectasis. Electronically Signed   By: Lavonia Dana M.D.   On: 06/10/2017 11:26   Ct Head Wo Contrast  Result Date: 06/10/2017 CLINICAL DATA:  Unexplained altered level of consciousness, headache and dizziness since yesterday, history hypertension, seizures EXAM: CT HEAD  WITHOUT CONTRAST TECHNIQUE: Contiguous axial images were obtained from the base of the skull through the vertex without intravenous contrast. Sagittal and coronal MPR images reconstructed from axial data set. COMPARISON:  01/22/2015 FINDINGS: Brain: Mild generalized atrophy. Normal ventricular morphology. No midline shift or mass effect. Old appearing BILATERAL basal ganglia lacunar infarcts. Mild small vessel chronic ischemic changes of deep cerebral white matter. No intracranial hemorrhage, mass lesion, or evidence of acute infarction. No extra-axial fluid collections. Vascular: Atherosclerotic calcification of internal carotid arteries and LEFT vertebral artery at skull base. Skull: Intact Sinuses/Orbits: Tiny mucosal retention cyst LEFT maxillary sinus. Otherwise clear. Other: N/A IMPRESSION: Atrophy with small vessel chronic ischemic changes of deep cerebral white matter. Old BILATERAL basal ganglia lacunar infarcts, though new on RIGHT since 2016. No acute intracranial abnormalities. Electronically Signed   By: Lavonia Dana M.D.   On: 06/10/2017 12:22   US Renal  Result Date: 06/10/2017 CLINICAL DATA:  Patient with renal failure. EXAM: RENAL / URINARY TRACT ULTRASOUND COMPLETE COMPARISON:  None. FINDINGS: Right Kidney: Length: 11.4 cm. Mild pelviectasis. Kidney is lobular in contour with increased cortical echogenicity. Left Kidney: Not adequately visualized and therefore not assessed. Bladder: Appears normal for degree of bladder distention. IMPRESSION: 1. Mild right pelviectasis. 2. Echogenic and lobular right kidney as can be seen with chronic medical renal disease. 3. The left kidney is not adequately visualized and therefore not assessed. Electronically Signed   By: Lovey Newcomer M.D.   On: 06/10/2017 21:24   Nm Pulmonary Perf And Vent  Result Date: 06/11/2017 CLINICAL DATA:  Pulmonary hypertension question chronic pulmonary embolism EXAM: NUCLEAR MEDICINE VENTILATION - PERFUSION LUNG SCAN TECHNIQUE:  Ventilation images were obtained in multiple projections using inhaled aerosol Tc-43m DTPA. Perfusion images were obtained in multiple projections after intravenous injection of Tc-9m MAA. RADIOPHARMACEUTICALS:  31.7 mCi of Tc-60m DTPA aerosol inhalation and 4.38 mCi Tc91m MAA-IV COMPARISON:  None Correlation: Chest radiograph 06/10/2017 FINDINGS: Ventilation: Central airway deposition of aerosol. Swallowed aerosol within stomach. No focal ventilatory defects. Perfusion: Normal IMPRESSION: Normal ventilation and perfusion lung scan. Electronically Signed   By: Lavonia Dana M.D.   On: 06/11/2017 14:02   US Abdomen Limited Ruq  Result Date: 06/11/2017 CLINICAL DATA:  History of cirrhosis. EXAM: ULTRASOUND ABDOMEN LIMITED RIGHT UPPER QUADRANT COMPARISON:  Abdominal ultrasound 10/31/2012. FINDINGS: Gallbladder: No gallstones or wall thickening visualized. No sonographic Murphy sign noted by sonographer. Common bile duct: Diameter: 0.2 cm Liver: No focal lesion. The liver border has a nodular appearance. Echotexture is coarsened. Portal vein is patent on color Doppler imaging with normal direction of blood flow towards the liver. IMPRESSION: Cirrhotic appearing liver.  No focal lesion.  No acute abnormality. Negative for gallstones. Electronically Signed   By: Inge Rise M.D.   On:  06/11/2017 14:49        Scheduled Meds: . amLODipine  5 mg Oral BID  . aspirin EC  81 mg Oral Daily  . calcitRIOL  0.5 mcg Oral Once per day on Mon Fri  . cholecalciferol  1,000 Units Oral BID  . clonazePAM  0.5 mg Oral BID  . docusate sodium  200 mg Oral QHS  . ferrous sulfate  325 mg Oral BID WC  . furosemide  80 mg Intravenous BID  . heparin  5,000 Units Subcutaneous Q8H  . hydrALAZINE  25 mg Oral Q8H  . insulin aspart  0-15 Units Subcutaneous TID WC  . insulin aspart  3 Units Subcutaneous TID WC  . insulin detemir  20 Units Subcutaneous BID  . potassium chloride  40 mEq Oral Once  . pravastatin  80 mg Oral  QHS  . sertraline  100 mg Oral Daily  . sodium chloride flush  3 mL Intravenous Q12H  . sodium chloride flush  3 mL Intravenous Q12H  . traZODone  50 mg Oral QHS  . trimethoprim-polymyxin b  2 drop Both Eyes BID   Continuous Infusions: . sodium chloride       LOS: 2 days     Cordelia Poche, MD [facility] 06/12/2017, 10:35 AM Pager: [phone]  If 7PM-7AM, please contact night-coverage [email or web address] Password [facility] 06/12/2017, 10:35 AM

## 2017-06-12 NOTE — Progress Notes (Signed)
Impression: 1 CKD stage IV -  2 HTN stable on home bp meds x 3 3 DM on insulin 4 Depression/ anxiety 5 HL 6 Hx pulm HTN 7 RA/ hx lupus  Plan - We will sign off.  She can f/u with [facility]  Subjective: Interval History: RHC today  Objective: Vital signs in last 24 hours: Temp:  [98.9 F (37.2 C)-99.5 F (37.5 C)] 98.9 F (37.2 C) (01/28 1215) Pulse Rate:  [88-105] 105 (01/28 1215) Resp:  [11-33] 13 (01/28 0920) BP: (157-206)/(45-80) 177/57 (01/28 1215) SpO2:  [89 %-99 %] 92 % (01/28 1215) Weight:  [75.3 kg (166 lb)] 75.3 kg (166 lb) (01/28 0537) Weight change: -6.703 kg (-12.4 oz)  Intake/Output from previous day: 01/27 0701 - 01/28 0700 In: 240 [P.O.:240] Out: 2650 [Urine:2650] Intake/Output this shift: Total I/O In: -  Out: 1 [Stool:1]  General appearance: alert and cooperative Resp: clear to auscultation bilaterally Cardio: regular rate and rhythm, S1, S2 normal, no murmur, click, rub or gallop Extremities: edema 1+  Lab Results: Recent Labs    06/12/17 0523 06/12/17 1033  WBC 5.3 7.0  HGB 9.8* 10.3*  HCT 31.7* 33.3*  PLT 159 175   BMET:  Recent Labs    06/11/17 0632 06/12/17 0523 06/12/17 1033  NA 136 141  --   K 3.7 3.8  --   CL 100* 105  --   CO2 22 24  --   GLUCOSE 102* 125*  --   BUN 23* 23*  --   CREATININE 2.32* 2.22* 2.35*  CALCIUM 8.8* 8.8*  --    No results for input(s): PTH in the last 72 hours. Iron Studies:  Recent Labs    06/11/17 1223  IRON 41  TIBC 223*  FERRITIN 122   Studies/Results: US Renal  Result Date: 06/10/2017 CLINICAL DATA:  Patient with renal failure. EXAM: RENAL / URINARY TRACT ULTRASOUND COMPLETE COMPARISON:  None. FINDINGS: Right Kidney: Length: 11.4 cm. Mild pelviectasis. Kidney is lobular in contour with increased cortical echogenicity. Left Kidney: Not adequately visualized and therefore not assessed. Bladder: Appears normal for degree of bladder distention. IMPRESSION: 1. Mild  right pelviectasis. 2. Echogenic and lobular right kidney as can be seen with chronic medical renal disease. 3. The left kidney is not adequately visualized and therefore not assessed. Electronically Signed   By: Lovey Newcomer M.D.   On: 06/10/2017 21:24   Nm Pulmonary Perf And Vent  Result Date: 06/11/2017 CLINICAL DATA:  Pulmonary hypertension question chronic pulmonary embolism EXAM: NUCLEAR MEDICINE VENTILATION - PERFUSION LUNG SCAN TECHNIQUE: Ventilation images were obtained in multiple projections using inhaled aerosol Tc-36m DTPA. Perfusion images were obtained in multiple projections after intravenous injection of Tc-59m MAA. RADIOPHARMACEUTICALS:  31.7 mCi of Tc-78m DTPA aerosol inhalation and 4.38 mCi Tc109m MAA-IV COMPARISON:  None Correlation: Chest radiograph 06/10/2017 FINDINGS: Ventilation: Central airway deposition of aerosol. Swallowed aerosol within stomach. No focal ventilatory defects. Perfusion: Normal IMPRESSION: Normal ventilation and perfusion lung scan. Electronically Signed   By: Lavonia Dana M.D.   On: 06/11/2017 14:02   US Abdomen Limited Ruq  Result Date: 06/11/2017 CLINICAL DATA:  History of cirrhosis. EXAM: ULTRASOUND ABDOMEN LIMITED RIGHT UPPER QUADRANT COMPARISON:  Abdominal ultrasound 10/31/2012. FINDINGS: Gallbladder: No gallstones or wall thickening visualized. No sonographic Murphy sign noted by sonographer. Common bile duct: Diameter: 0.2 cm Liver: No focal lesion. The liver border has a nodular appearance. Echotexture is coarsened. Portal vein is patent on color Doppler imaging with normal direction of blood  flow towards the liver. IMPRESSION: Cirrhotic appearing liver.  No focal lesion.  No acute abnormality. Negative for gallstones. Electronically Signed   By: Inge Rise M.D.   On: 06/11/2017 14:49    Scheduled: . amLODipine  5 mg Oral BID  . aspirin EC  81 mg Oral Daily  . calcitRIOL  0.5 mcg Oral Once per day on Mon Fri  . cholecalciferol  1,000 Units Oral  BID  . clonazePAM  0.5 mg Oral BID  . docusate sodium  200 mg Oral QHS  . ferrous sulfate  325 mg Oral BID WC  . furosemide  80 mg Intravenous BID  . heparin  5,000 Units Subcutaneous Q8H  . hydrALAZINE  25 mg Oral Q8H  . insulin aspart  0-15 Units Subcutaneous TID WC  . insulin aspart  3 Units Subcutaneous TID WC  . insulin detemir  20 Units Subcutaneous BID  . pravastatin  80 mg Oral QHS  . sertraline  100 mg Oral Daily  . sodium chloride flush  3 mL Intravenous Q12H  . sodium chloride flush  3 mL Intravenous Q12H  . traZODone  50 mg Oral QHS  . trimethoprim-polymyxin b  2 drop Both Eyes BID      LOS: 2 days   Estanislado Emms 06/12/2017,2:14 PM

## 2017-06-12 NOTE — Plan of Care (Signed)
  Progressing Safety: Ability to remain free from injury will improve 06/12/2017 0622 - Progressing by Ardine Eng, RN

## 2017-06-12 NOTE — Progress Notes (Signed)
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    Clinical Impression   PTA, pt lived alone at home and was modified independent with mobility and ADL @ RW level. Pt currently requires min A for mobility and mod A for ADL @ RW level. Pt tearful at times; pt reassured and encouraged. At this time, feel pt will be appropirate to DC home with initial 24/7 S. Will continue to follow acutely to address established goals to facilitate safe DC home.    Follow Up Recommendations  Home health OT;Supervision/Assistance - 24 hour(initially)    Equipment Recommendations  3 in 1 bedside commode(unsure if pt has one)    Recommendations for Other Services       Precautions / Restrictions Precautions Precautions: Fall Restrictions Weight Bearing Restrictions: No      Mobility Bed Mobility Overal bed mobility: Needs Assistance Bed Mobility: Rolling;Sidelying to Sit   Sidelying to sit: Min assist       General bed mobility comments:  Min A to trasnition trunk to upright position  Transfers Overall transfer level: Needs assistance Equipment used: Rolling walker (2 wheeled) Transfers: Sit to/from [facility] Sit to Stand: Min assist Stand pivot transfers: Min assist            Balance Overall balance assessment: Needs assistance   Sitting balance-Leahy Scale: Good       Standing balance-Leahy Scale: Fair                             ADL either performed or assessed with clinical judgement   ADL Overall  ADL's : Needs assistance/impaired     Grooming: Set up;Sitting   Upper Body Bathing: Set up;Sitting   Lower Body Bathing: Minimal assistance;Sit to/from stand   Upper Body Dressing : Minimal assistance;Sitting   Lower Body Dressing: Moderate assistance;Sit to/from stand   Toilet Transfer: Minimal assistance;RW;Stand-pivot   Toileting- Clothing Manipulation and Hygiene: Moderate assistance       Functional mobility during ADLs: Minimal assistance;Rolling walker;Cueing for safety General ADL Comments: Appears to be limiting herself with her ability to complete tasks at times     Vision         Perception     Praxis      Pertinent Vitals/Pain Pain Assessment: Faces Faces Pain Scale: Hurts even more Pain Location: R groin; R upper chest; BLE; "everywher because of fibromyalgia" Pain Descriptors / Indicators: Aching;Discomfort Pain Intervention(s): Limited activity within patient's tolerance;Repositioned;Heat applied     Hand Dominance Right   Extremity/Trunk Assessment Upper Extremity Assessment Upper Extremity Assessment: Generalized weakness   Lower Extremity Assessment Lower Extremity Assessment: Defer to PT evaluation   Cervical / Trunk Assessment Cervical / Trunk Assessment: Normal   Communication Communication Communication: No difficulties   Cognition Arousal/Alertness: Awake/alert Behavior During Therapy: Anxious Overall Cognitive Status: History of cognitive impairments - at baseline Area of Impairment: Memory                     Memory: Decreased short-term memory  General Comments: tangentail at times; tearful at times; talked about her father and husband who have passed away; states she is very religious   General Comments       Exercises     Shoulder Instructions      Home Living Family/patient expects to be discharged to:: Private residence Living Arrangements: Alone Available Help at Discharge: Family;Available  PRN/intermittently Type of Home: House Home Access: Ramped entrance     Home Layout: Laundry or work area in basement;Two level     Bathroom Shower/Tub: Teacher, early years/pre: Standard Bathroom Accessibility: Yes How Accessible: Accessible via walker Home Equipment: [place] - 2 wheels;Bedside commode;Shower seat;Wheelchair - manual   Additional Comments: pt does not go in basement. Her husband passed away a year ago. Daughter checks on her consistently and sister in law lives next door  Lives With: Alone    Prior Functioning/Environment Level of Independence: Independent with assistive device(s)        Comments: uses RW        OT Problem List: Decreased strength;Decreased activity tolerance;Impaired balance (sitting and/or standing);Decreased safety awareness;Pain      OT Treatment/Interventions: Self-care/ADL training;Therapeutic exercise;Energy conservation;DME and/or AE instruction;Therapeutic activities;Patient/family education;Balance training;Cognitive remediation/compensation    OT Goals(Current goals can be found in the care plan section) Acute Rehab OT Goals Patient Stated Goal: return home to her pet rabbit OT Goal Formulation: With patient Time For Goal Achievement: 06/26/17 Potential to Achieve Goals: Good  OT Frequency: Min 3X/week   Barriers to D/C:            Co-evaluation              AM-PAC PT "6 Clicks" Daily Activity     Outcome Measure Help from another person eating meals?: None Help from another person taking care of personal grooming?: A Little Help from another person toileting, which includes using toliet, bedpan, or urinal?: A Lot Help from another person bathing (including washing, rinsing, drying)?: A Lot Help from another person to put on and taking off regular upper body clothing?: A Little Help from another person to put on and taking off regular lower body clothing?: A Lot 6 Click Score: 16   End of Session  Equipment Utilized During Treatment: Gait belt;Rolling walker Nurse Communication: Mobility status  Activity Tolerance: Patient tolerated treatment well Patient left: in chair;with call bell/phone within reach;with chair alarm set;with family/visitor present  OT Visit Diagnosis: Unsteadiness on feet (R26.81);Muscle weakness (generalized) (M62.81);Pain Pain - part of body: Leg;Hip;Arm;Shoulder                Time: 1440-1509 OT Time Calculation (min): 29 min Charges:  OT General Charges $OT Visit: 1 Visit OT Evaluation $OT Eval Moderate Complexity: 1 Mod OT Treatments $Self Care/Home Management : 8-22 mins G-Codes:     [facility], OT/L  [phone] 06/12/2017  Stacy Smith,Stacy Smith 06/12/2017, 3:30 PM

## 2017-06-13 ENCOUNTER — Inpatient Hospital Stay (HOSPITAL_COMMUNITY): Payer: Medicare HMO

## 2017-06-13 ENCOUNTER — Encounter (HOSPITAL_COMMUNITY): Payer: Medicare HMO | Admitting: Cardiology

## 2017-06-13 DIAGNOSIS — I5030 Unspecified diastolic (congestive) heart failure: Secondary | ICD-10-CM

## 2017-06-13 DIAGNOSIS — I5031 Acute diastolic (congestive) heart failure: Secondary | ICD-10-CM

## 2017-06-13 LAB — URINALYSIS, ROUTINE W REFLEX MICROSCOPIC
Bilirubin Urine: NEGATIVE
Glucose, UA: 50 mg/dL — AB
Hgb urine dipstick: NEGATIVE
KETONES UR: NEGATIVE mg/dL
Leukocytes, UA: NEGATIVE
Nitrite: NEGATIVE
PROTEIN: 100 mg/dL — AB
Specific Gravity, Urine: 1.012 (ref 1.005–1.030)
pH: 6 (ref 5.0–8.0)

## 2017-06-13 LAB — BASIC METABOLIC PANEL
Anion gap: 17 — ABNORMAL HIGH (ref 5–15)
BUN: 28 mg/dL — ABNORMAL HIGH (ref 6–20)
CO2: 20 mmol/L — ABNORMAL LOW (ref 22–32)
CREATININE: 2.42 mg/dL — AB (ref 0.44–1.00)
Calcium: 9.1 mg/dL (ref 8.9–10.3)
Chloride: 104 mmol/L (ref 101–111)
GFR calc non Af Amer: 19 mL/min — ABNORMAL LOW (ref >60)
GFR, EST AFRICAN AMERICAN: 22 mL/min — AB (ref >60)
Glucose, Bld: 294 mg/dL — ABNORMAL HIGH (ref 65–99)
Potassium: 3.8 mmol/L (ref 3.5–5.1)
Sodium: 141 mmol/L (ref 135–145)

## 2017-06-13 LAB — HIV ANTIBODY (ROUTINE TESTING W REFLEX): HIV Screen 4th Generation wRfx: NONREACTIVE

## 2017-06-13 LAB — GLUCOSE, CAPILLARY
GLUCOSE-CAPILLARY: 263 mg/dL — AB (ref 65–99)
Glucose-Capillary: 229 mg/dL — ABNORMAL HIGH (ref 65–99)
Glucose-Capillary: 146 mg/dL — ABNORMAL HIGH (ref 65–99)
Glucose-Capillary: 103 mg/dL — ABNORMAL HIGH (ref 65–99)

## 2017-06-13 LAB — CBC
HEMATOCRIT: 32.2 % — AB (ref 36.0–46.0)
Hemoglobin: 9.9 g/dL — ABNORMAL LOW (ref 12.0–15.0)
MCH: 27.3 pg (ref 26.0–34.0)
MCHC: 30.7 g/dL (ref 30.0–36.0)
MCV: 88.7 fL (ref 78.0–100.0)
Platelets: 157 10*3/uL (ref 150–400)
RBC: 3.63 MIL/uL — AB (ref 3.87–5.11)
RDW: 16.8 % — ABNORMAL HIGH (ref 11.5–15.5)
WBC: 6.7 10*3/uL (ref 4.0–10.5)

## 2017-06-13 LAB — TROPONIN I
TROPONIN I: 0.1 ng/mL — AB (ref <0.03)
TROPONIN I: 0.39 ng/mL — AB (ref <0.03)
Troponin I: 0.11 ng/mL (ref <0.03)

## 2017-06-13 MED ORDER — ALPRAZOLAM 0.5 MG PO TABS
0.5000 mg | ORAL_TABLET | Freq: Once | ORAL | Status: AC
  Administered 2017-06-13: 0.5 mg via ORAL
  Filled 2017-06-13: qty 1

## 2017-06-13 MED ORDER — PROMETHAZINE HCL 25 MG/ML IJ SOLN
12.5000 mg | Freq: Once | INTRAMUSCULAR | Status: AC
  Administered 2017-06-13: 12.5 mg via INTRAVENOUS
  Filled 2017-06-13: qty 1

## 2017-06-13 MED ORDER — HYDRALAZINE HCL 50 MG PO TABS
50.0000 mg | ORAL_TABLET | Freq: Three times a day (TID) | ORAL | Status: DC
  Administered 2017-06-13 – 2017-06-14 (×3): 50 mg via ORAL
  Filled 2017-06-13 (×3): qty 1

## 2017-06-13 MED ORDER — NITROGLYCERIN 0.4 MG SL SUBL
SUBLINGUAL_TABLET | SUBLINGUAL | Status: AC
  Administered 2017-06-13: 0.4 mg
  Filled 2017-06-13: qty 1

## 2017-06-13 MED ORDER — HYDRALAZINE HCL 25 MG PO TABS
25.0000 mg | ORAL_TABLET | Freq: Once | ORAL | Status: AC
  Administered 2017-06-13: 25 mg via ORAL
  Filled 2017-06-13: qty 1

## 2017-06-13 MED ORDER — POTASSIUM CHLORIDE CRYS ER 20 MEQ PO TBCR
40.0000 meq | EXTENDED_RELEASE_TABLET | Freq: Once | ORAL | Status: AC
  Administered 2017-06-13: 40 meq via ORAL
  Filled 2017-06-13: qty 2

## 2017-06-13 MED ORDER — METOLAZONE 2.5 MG PO TABS
2.5000 mg | ORAL_TABLET | Freq: Once | ORAL | Status: AC
  Administered 2017-06-13: 2.5 mg via ORAL
  Filled 2017-06-13: qty 1

## 2017-06-13 MED FILL — Lidocaine HCl Local Inj 1%: INTRAMUSCULAR | Qty: 20 | Status: AC

## 2017-06-13 NOTE — Progress Notes (Signed)
Patient ID: Stacy Smith, female   DOB: [birth year], 72 y.o.   MRN: [id number]     Advanced Heart Failure Rounding Note   HF Cardiology: Aundra Dubin   Subjective:    Patient feeling poorly today. Had N/V and epigastric pain. No complaints of chest pain currently, but reported earlier.  Troponin 0.10. Weight up 4 lbs, but not out of bed this am so likely inaccurate.   RHC (1/18): mean RA 8, PA 78/25 mean 45, mean PCWP 18, CI 3.6, PVR 4.6 WU Echo (9/18): EF 60-65%, normal RV systolic function, PASP 58, mild TR and MR.  Echo (1/19): EF 65-70%, mild aortic stenosis, mildly dilated RV normal systolic function, PASP 60 mmHg.  V/Q scan (1/19): No PE Abdominal US (1/19): Liver appears cirrhotic. No gallstones  RHC Procedural Findings (06/12/17): Hemodynamics (mmHg) RA mean 13 RV 65/15 PA 61/27, mean 39 PCWP mean 20 Oxygen saturations: PA 80% AO 97% Cardiac Output (Fick) 10.1  Cardiac Index (Fick) 5.87 PVR 1.9 WU Cardiac Output (Thermo) 9.23 Cardiac Output (Thermo) 5.34 PVR 2 WU  Objective:   Weight Range: 170 lb 3.1 oz (77.2 kg) Body mass index is 33.24 kg/m.   Vital Signs:   Temp:  [98 F (36.7 C)-101.1 F (38.4 C)] 98.5 F (36.9 C) (01/29 0513) Pulse Rate:  [88-110] 88 (01/29 0600) Resp:  [18] 18 (01/29 0600) BP: (110-177)/(57-80) 160/80 (01/29 0600) SpO2:  [92 %-98 %] 98 % (01/29 0513) Weight:  [170 lb 3.1 oz (77.2 kg)] 170 lb 3.1 oz (77.2 kg) (01/29 0513) Last BM Date: 06/10/17  Weight change: Filed Weights   06/11/17 0521 06/12/17 0537 06/13/17 0513  Weight: 168 lb 6.9 oz (76.4 kg) 166 lb (75.3 kg) 170 lb 3.1 oz (77.2 kg)    Intake/Output:   Intake/Output Summary (Last 24 hours) at 06/13/2017 0929 Last data filed at 06/13/2017 0500 Gross per 24 hour  Intake 320 ml  Output 1702 ml  Net -1382 ml      Physical Exam    General: NAD. Elderly. Fatigued.  HEENT: Normal Neck: Supple. JVP 12 cm. Carotids 2+ bilat; no bruits. No thyromegaly or nodule noted. Cor:  PMI nondisplaced. RRR, No M/G/R noted Lungs: CTAB, normal effort. Abdomen: Soft, mild epigastric tenderness, non-distended, no HSM. No bruits or masses. +BS  Extremities: No cyanosis, clubbing, or rash. 1+ ankle edema.  Neuro: Alert & orientedx3, cranial nerves grossly intact. moves all 4 extremities w/o difficulty. Affect flat  Telemetry   NSR 90s, personally reviewed.   Labs    CBC Recent Labs    06/12/17 0523 06/12/17 1033  WBC 5.3 7.0  HGB 9.8* 10.3*  HCT 31.7* 33.3*  MCV 88.3 88.3  PLT 159 633   Basic Metabolic Panel Recent Labs    06/12/17 0523 06/12/17 1033 06/13/17 0518  NA 141  --  141  K 3.8  --  3.8  CL 105  --  104  CO2 24  --  20*  GLUCOSE 125*  --  294*  BUN 23*  --  28*  CREATININE 2.22* 2.35* 2.42*  CALCIUM 8.8*  --  9.1   Liver Function Tests Recent Labs    06/10/17 1233  AST 25  ALT 13*  ALKPHOS 79  BILITOT 0.7  PROT 6.8  ALBUMIN 2.9*   No results for input(s): LIPASE, AMYLASE in the last 72 hours. Cardiac Enzymes Recent Labs    06/13/17 0518  TROPONINI 0.10*    BNP: BNP (last 3 results) Recent Labs  06/10/17 1538 06/11/17 0632  BNP 394.7* 470.6*    ProBNP (last 3 results) No results for input(s): PROBNP in the last 8760 hours.   D-Dimer No results for input(s): DDIMER in the last 72 hours. Hemoglobin A1C Recent Labs    06/10/17 1631  HGBA1C 5.5   Fasting Lipid Panel No results for input(s): CHOL, HDL, LDLCALC, TRIG, CHOLHDL, LDLDIRECT in the last 72 hours. Thyroid Function Tests No results for input(s): TSH, T4TOTAL, T3FREE, THYROIDAB in the last 72 hours.  Invalid input(s): FREET3  Other results:   Imaging    No results found.   Medications:     Scheduled Medications: . amLODipine  5 mg Oral BID  . aspirin EC  81 mg Oral Daily  . calcitRIOL  0.5 mcg Oral Once per day on Mon Fri  . cholecalciferol  1,000 Units Oral BID  . clonazePAM  0.5 mg Oral BID  . docusate sodium  200 mg Oral QHS  .  ferrous sulfate  325 mg Oral BID WC  . furosemide  80 mg Intravenous BID  . heparin  5,000 Units Subcutaneous Q8H  . hydrALAZINE  25 mg Oral Once  . hydrALAZINE  50 mg Oral Q8H  . insulin aspart  0-15 Units Subcutaneous TID WC  . insulin aspart  3 Units Subcutaneous TID WC  . insulin detemir  20 Units Subcutaneous BID  . pravastatin  80 mg Oral QHS  . sertraline  100 mg Oral Daily  . sodium chloride flush  3 mL Intravenous Q12H  . sodium chloride flush  3 mL Intravenous Q12H  . traZODone  50 mg Oral QHS  . trimethoprim-polymyxin b  2 drop Both Eyes BID    Infusions: . sodium chloride      PRN Medications: sodium chloride, acetaminophen **OR** acetaminophen, acetaminophen, cloNIDine, ipratropium-albuterol, ondansetron (ZOFRAN) IV, oxyCODONE, sodium chloride flush    Patient Profile   72 yo with history of CKD stage IV, diastolic CHF, HTN, ?cirrhosis, pulmonary hypertension was admitted with acute on chronic diastolic CHF.   Assessment/Plan   1. Acute on chronic diastolic CHF: Echo 9/47 with EF 65-70%, mildly dilated RV.  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 Stacy Smith 1/28, however, showed pulmonary venous hypertension and elevated filling pressures with PVR about 2 WU.  Good diuresis yesterday with IV Lasix. Remains volume overloaded on exam.  - Continue Lasix 80 mg IV BID, add dose of metolazone 2.5 x 1.  - Continue ted hose.  - Stacy Smith may benefit from Cardiomems as outpatient => will workup for this.  - Troponin 0.10 this am with non specific upper abdominal/chest pain with accompanying N/V and bowel movements. 2. 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  Echo with mildly dilated RV.  V/Q scan with no evidence for chronic PE. RHC 1/19 (this admission), however, showed primarily pulmonary venous hypertension with PVR only 2 WU. - Treatment at  this point will be diuretics rather than selective pulmonary vasodilators.   No change 3. CKD: Stage IV.   - Creatinine slightly higher at 2.4. Continue to follow with diuresis.  - Followed by nephrology in [place].  4. HTN:  - BP running high. Increase hydralazine to 50 mg TID.  5. Cirrhosis: RUQ [place] suggestive of cirrhosis.  6. Abd pain: RUQ [place] did not show evidence of cholecystitis.  - Continue as needed Zofran.   Length of Stay:  3  Annamaria Helling  06/13/2017, 9:29 AM  Advanced Heart Failure Team Pager [phone] (M-F; 7a - 4p)  Please contact [facility] for night-coverage after hours (4p -7a ) and weekends on [email or web address]  Patient seen with PA, agree with the above note.  Nausea/vomiting/epigastric pain overnight.  Feeling better now but fatigued. Tm 101.1 last night.    On exam, JVP 12 cm and 1+ ankle edema.  Mild epigastric tenderness.  Does not want to get out of bed.   With fever, would send CBC today.  Check blood and urine cultures, UA.  Repeat CXR PA/lateral.   Diastolic CHF, pulmonary venous hypertension on RHC.  Still with volume overload.  Creatinine fairly stable with diuresis.  Will continue Lasix 80 mg IV bid with a dose of metolazone.  Would workup for Cardiomems.   Increase hydralazine as above.   Mild TnI elevation to 0.1.  Most likely demand ischemia with volume overload and elevated creatinine.  Symptoms seem more epigastric than chest.  - Continue ASA 81 and statin.  - Cycle troponin, no further workup if no trend.  Stacy Smith would be a poor coronary angiography candidate with elevated creatinine, avoid unless clear ACS.   Loralie Champagne 06/13/2017 11:57 AM

## 2017-06-13 NOTE — Progress Notes (Signed)
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     PT Comments    No c/o pain but reports very fatigued from being up all night throwing up.  Agreeable to attempt therapy, session limited within activity tolerance.  Pt currently requiring min assist for bed mobility and transfers.  Current plan remains appropriate.  Will continue to follow acutely to progress strength and activity tolerance.      Follow Up Recommendations  Home health PT     Equipment Recommendations  None recommended by PT    Recommendations for Other Services       Precautions / Restrictions Precautions Precautions: Fall Restrictions Weight Bearing Restrictions: No    Mobility  Bed Mobility Overal bed mobility: Needs Assistance Bed Mobility: Supine to Sit     Supine to sit: Min assist        Transfers Overall transfer level: Needs assistance Equipment used: 1 person hand held assist Transfers: Stand Pivot Transfers Sit to Stand: Min assist Stand pivot transfers: Min assist          Ambulation/Gait             General Gait Details: declined ambulation today, states very weak/fatigued from being ill last night   Stairs            Wheelchair Mobility    Modified Rankin (Stroke Patients Only)       Balance                                            Cognition Arousal/Alertness: Awake/alert Behavior During Therapy: Anxious Overall Cognitive Status: History of cognitive impairments - at baseline                                         Exercises      General Comments        Pertinent Vitals/Pain Pain Assessment: No/denies pain    Home Living                      Prior Function            PT Goals (current goals can now be found in the care plan section) Acute Rehab PT Goals Patient Stated Goal: return home to her pet rabbit PT Goal Formulation: With patient Time For Goal Achievement: 06/25/17 Potential to Achieve Goals: Fair Progress towards PT goals: Progressing toward goals    Frequency    Min 3X/week      PT Plan Current plan remains appropriate    Co-evaluation              AM-PAC PT "6 Clicks" Daily Activity  Outcome Measure  Difficulty turning over in bed (including adjusting bedclothes, sheets and blankets)?: A Little Difficulty moving from lying on back to sitting on the side of the bed? : A Little Difficulty sitting down on and standing up from  a chair with arms (e.g., wheelchair, bedside commode, etc,.)?: A Little Help needed moving to and from a bed to chair (including a wheelchair)?: A Little Help needed walking in hospital room?: A Lot Help needed climbing 3-5 steps with a railing? : A Lot 6 Click Score: 16    End of Session   Activity Tolerance: Patient limited by fatigue Patient left: in chair;with call bell/phone within reach;with chair alarm set;with family/visitor present Nurse Communication: Mobility status PT Visit Diagnosis: Muscle weakness (generalized) (M62.81)     Time: 5953-9672 PT Time Calculation (min) (ACUTE ONLY): 13 min  Charges:  $Therapeutic Activity: 8-22 mins                    G Codes:          Michel Santee 06/13/2017, 2:47 PM

## 2017-06-13 NOTE — Progress Notes (Addendum)
At 5 am pt complained chest pain, 12 lead stat EKG done, NItro SL given 8 twice, Cardiologist aware, troponin q6*3 times,  Pt looked more anxious so tab xanax 0.5mg  given too, will continue to monitor  [facility]

## 2017-06-13 NOTE — Progress Notes (Signed)
Pt refusing to stand or get out of bed at this time.  Pt says she is too sick to get up.  Will continue to monitor.

## 2017-06-13 NOTE — Progress Notes (Signed)
[facility] PROGRESS NOTE    Progress Note  Stacy Smith  HCW:[id number] DOB: 1945/12/16 DOA: 06/10/2017 PCP: Jacqualine Code, DO     Brief Narrative:   Stacy Smith is an 72 y.o. female past medical history significant for pulmonary hypertension, with a right heart cath in January 2018 that showed pressure of 74,, chronic diastolic heart failure diabetes mellitus with chronic renal disease stage III, presents to the ED with dyspnea and lower extremity edema  Assessment/Plan:   Acute diastolic CHF (congestive heart failure) ([place]): VQ scan was normal right heart cath showed moderate pulmonary venous hypertension, 2D echo showed an EF of 65% with grade 1 diastolic heart failure with right-sided showed only mildly dilated right ventricle. Cardiology was consulted recommended IV diuresis. Right heart cath on 06/04/2016 showed pulmonary venous hypertension with an elevated filling pressure. She continues to diurese well, she is about 4 L negative. Due to strict I's and O's Daily weights restrict her fluids. Her estimated dry weight remain's unknown.  In May 18, 2016 estimated dry weight was 68 kg, before that on 04/13/2016 it was 70 kg. Further per advanced heart failure team.  Pulmonary hypertension: Right heart cath on the 18th showed a mixed picture, cardiology recommends IV diuresis.  Acute kidney injury on chronic kidney disease stage IV: Baseline creatinine 1.8-1.9 Creatinine has worsened today Nephrology was consulted who agreed with IV diuresis now they have signed off.  Essential hypertension: Continue amlodipine hydralazine and clonidine.  Hyperlipidemia: Continue statins.  Normocytic anemia: Decreased TIBC only, would favor due to chronic renal disease. Follow-up with nephrology as an outpatient.  Major depressive disorder: Psychiatry was consulted recommended Klonopin, continue Zoloft.  Rashes: Follow-up with PCP as an  outpatient.  Cirrhosis: INR and platelets are stable no asterixis mentation is intact.   DVT prophylaxis: lovenox Family Communication:none Disposition Plan/Barrier to D/C: unable to determine Code Status:     Code Status Orders  (From admission, onward)        Start     Ordered   06/10/17 1608  Full code  Continuous     06/10/17 1625    Code Status History    Date Active Date Inactive Code Status Order ID Comments User Context   05/18/2016 10:30 05/18/2016 17:52 Full Code [id number]  Burnell Blanks, MD Inpatient   01/22/2015 20:40 01/28/2015 19:45 Full Code [id number]  Stacy Gee, MD Inpatient        IV Access:    Peripheral IV   Procedures and diagnostic studies:   Nm Pulmonary Perf And Vent  Result Date: 06/11/2017 CLINICAL DATA:  Pulmonary hypertension question chronic pulmonary embolism EXAM: NUCLEAR MEDICINE VENTILATION - PERFUSION LUNG SCAN TECHNIQUE: Ventilation images were obtained in multiple projections using inhaled aerosol Tc-78m DTPA. Perfusion images were obtained in multiple projections after intravenous injection of Tc-46m MAA. RADIOPHARMACEUTICALS:  31.7 mCi of Tc-79m DTPA aerosol inhalation and 4.38 mCi Tc75m MAA-IV COMPARISON:  None Correlation: Chest radiograph 06/10/2017 FINDINGS: Ventilation: Central airway deposition of aerosol. Swallowed aerosol within stomach. No focal ventilatory defects. Perfusion: Normal IMPRESSION: Normal ventilation and perfusion lung scan. Electronically Signed   By: Lavonia Dana M.D.   On: 06/11/2017 14:02   US Abdomen Limited Ruq  Result Date: 06/11/2017 CLINICAL DATA:  History of cirrhosis. EXAM: ULTRASOUND ABDOMEN LIMITED RIGHT UPPER QUADRANT COMPARISON:  Abdominal ultrasound 10/31/2012. FINDINGS: Gallbladder: No gallstones or wall thickening visualized. No sonographic Murphy sign noted by sonographer. Common bile duct: Diameter: 0.2 cm Liver: No focal lesion.  The liver border has a nodular appearance. Echotexture is  coarsened. Portal vein is patent on color Doppler imaging with normal direction of blood flow towards the liver. IMPRESSION: Cirrhotic appearing liver.  No focal lesion.  No acute abnormality. Negative for gallstones. Electronically Signed   By: Inge Rise M.D.   On: 06/11/2017 14:49     Medical Consultants:    None.  Anti-Infectives:   None  Subjective:    Stacy Smith she relates his breathing is slightly improved.   Objective:    Vitals:   06/12/17 2130 06/13/17 0100 06/13/17 0513 06/13/17 0600  BP: 140/80 130/80 (!) 170/62 (!) 160/80  Pulse:   (!) 109 88  Resp:   18 18  Temp: 99 F (37.2 C) 98 F (36.7 C) 98.5 F (36.9 C)   TempSrc:   Oral   SpO2: 97% 98% 98%   Weight:   77.2 kg (170 lb 3.1 oz)   Height:        Intake/Output Summary (Last 24 hours) at 06/13/2017 0920 Last data filed at 06/13/2017 0500 Gross per 24 hour  Intake 320 ml  Output 1702 ml  Net -1382 ml   Filed Weights   06/11/17 0521 06/12/17 0537 06/13/17 0513  Weight: 76.4 kg (168 lb 6.9 oz) 75.3 kg (166 lb) 77.2 kg (170 lb 3.1 oz)    Exam: General exam: In no acute distress. Respiratory system: Good air movement and clear to auscultation. Cardiovascular system: S1 & S2 heard, RRR. Gastrointestinal system: Abdomen is nondistended, soft and nontender.  Central nervous system: Alert and oriented. No focal neurological deficits. Extremities: No pedal edema. Skin: No rashes, lesions or ulcers   Data Reviewed:    Labs: Basic Metabolic Panel: Recent Labs  Lab 06/10/17 1037 06/10/17 1631 06/11/17 0632 06/12/17 0523 06/12/17 1033 06/13/17 0518  NA 129*  --  136 141  --  141  K 4.1  --  3.7 3.8  --  3.8  CL 97*  --  100* 105  --  104  CO2 18*  --  22 24  --  20*  GLUCOSE 348*  --  102* 125*  --  294*  BUN 25*  --  23* 23*  --  28*  CREATININE 2.43* 2.50* 2.32* 2.22* 2.35* 2.42*  CALCIUM 8.3*  --  8.8* 8.8*  --  9.1   GFR Estimated Creatinine Clearance: 19.6 mL/min (A)  (by C-G formula based on SCr of 2.42 mg/dL (H)). Liver Function Tests: Recent Labs  Lab 06/10/17 1233  AST 25  ALT 13*  ALKPHOS 79  BILITOT 0.7  PROT 6.8  ALBUMIN 2.9*   No results for input(s): LIPASE, AMYLASE in the last 168 hours. Recent Labs  Lab 06/10/17 1233  AMMONIA 49*   Coagulation profile Recent Labs  Lab 06/12/17 0523  INR 1.18    CBC: Recent Labs  Lab 06/10/17 1037 06/10/17 1631 06/11/17 0632 06/12/17 0523 06/12/17 1033  WBC 6.5 4.7 5.0 5.3 7.0  HGB 9.1* 8.9* 9.7* 9.8* 10.3*  HCT 29.8* 28.9* 31.5* 31.7* 33.3*  MCV 87.9 87.0 87.0 88.3 88.3  PLT 124* 135* 163 159 175   Cardiac Enzymes: Recent Labs  Lab 06/13/17 0518  TROPONINI 0.10*   BNP (last 3 results) No results for input(s): PROBNP in the last 8760 hours. CBG: Recent Labs  Lab 06/12/17 0846 06/12/17 1254 06/12/17 1831 06/12/17 2123 06/13/17 0742  GLUCAP 155* 218* 192* 184* 263*   D-Dimer: No results for input(s): DDIMER  in the last 72 hours. Hgb A1c: Recent Labs    06/10/17 1631  HGBA1C 5.5   Lipid Profile: No results for input(s): CHOL, HDL, LDLCALC, TRIG, CHOLHDL, LDLDIRECT in the last 72 hours. Thyroid function studies: No results for input(s): TSH, T4TOTAL, T3FREE, THYROIDAB in the last 72 hours.  Invalid input(s): FREET3 Anemia work up: Recent Labs    06/11/17 1223  FERRITIN 122  TIBC 223*  IRON 41   Sepsis Labs: Recent Labs  Lab 06/10/17 1631 06/11/17 0632 06/12/17 0523 06/12/17 1033  WBC 4.7 5.0 5.3 7.0   Microbiology No results found for this or any previous visit (from the past 240 hour(s)).   Medications:   . amLODipine  5 mg Oral BID  . aspirin EC  81 mg Oral Daily  . calcitRIOL  0.5 mcg Oral Once per day on Mon Fri  . cholecalciferol  1,000 Units Oral BID  . clonazePAM  0.5 mg Oral BID  . docusate sodium  200 mg Oral QHS  . ferrous sulfate  325 mg Oral BID WC  . furosemide  80 mg Intravenous BID  . heparin  5,000 Units Subcutaneous Q8H  .  hydrALAZINE  25 mg Oral Once  . hydrALAZINE  50 mg Oral Q8H  . insulin aspart  0-15 Units Subcutaneous TID WC  . insulin aspart  3 Units Subcutaneous TID WC  . insulin detemir  20 Units Subcutaneous BID  . pravastatin  80 mg Oral QHS  . sertraline  100 mg Oral Daily  . sodium chloride flush  3 mL Intravenous Q12H  . sodium chloride flush  3 mL Intravenous Q12H  . traZODone  50 mg Oral QHS  . trimethoprim-polymyxin b  2 drop Both Eyes BID   Continuous Infusions: . sodium chloride       LOS: 3 days   Charlynne Cousins  [facility] Pager [phone]  *Please refer to [email or web address], password [email or web address] to get updated schedule on who will round on this patient, as hospitalists switch teams weekly. If 7PM-7AM, please contact night-coverage at [email or web address], password [email or web address] for any overnight needs.  06/13/2017, 9:20 AM

## 2017-06-13 NOTE — Progress Notes (Signed)
CRITICAL VALUE ALERT  Critical Value: troponin 0.10  Date & Time Notied:  06/13/2016, 7.15am  Provider Notified: attending MD  Orders Received/Actions taken: waiting response

## 2017-06-13 NOTE — Progress Notes (Signed)
Responded  To page  For  Chest pain  Nitro s/l , xanax  0.5  Mg  Given , pt  Has  Anxiety  Per  Nursing s taff  Also  Cardiac  Enzymes  And  ekg  Ordered  And will review

## 2017-06-13 NOTE — Progress Notes (Signed)
Pt had 3 episodes of vomiting overnight, after IV Zofran not helping , IV phenergan given after MD's order that helped pt to sleep some as well, Temperature dropped to 98 from 101 after tylenol, pt been anxious whole night, she requested RN stay with her all the time, BP stable, denies CP and distress, will continue to monitor the patient  Palma Holter, RN

## 2017-06-14 ENCOUNTER — Other Ambulatory Visit: Payer: Self-pay

## 2017-06-14 DIAGNOSIS — N184 Chronic kidney disease, stage 4 (severe): Secondary | ICD-10-CM

## 2017-06-14 DIAGNOSIS — I214 Non-ST elevation (NSTEMI) myocardial infarction: Secondary | ICD-10-CM

## 2017-06-14 DIAGNOSIS — F339 Major depressive disorder, recurrent, unspecified: Secondary | ICD-10-CM

## 2017-06-14 DIAGNOSIS — D500 Iron deficiency anemia secondary to blood loss (chronic): Secondary | ICD-10-CM

## 2017-06-14 LAB — LIPID PANEL
CHOL/HDL RATIO: 4 ratio
Cholesterol: 166 mg/dL (ref 0–200)
HDL: 42 mg/dL (ref >40)
LDL CALC: 93 mg/dL (ref 0–99)
TRIGLYCERIDES: 156 mg/dL — AB (ref <150)
VLDL: 31 mg/dL (ref 0–40)

## 2017-06-14 LAB — GLUCOSE, CAPILLARY
GLUCOSE-CAPILLARY: 99 mg/dL (ref 65–99)
GLUCOSE-CAPILLARY: 160 mg/dL — AB (ref 65–99)
Glucose-Capillary: 208 mg/dL — ABNORMAL HIGH (ref 65–99)
Glucose-Capillary: 143 mg/dL — ABNORMAL HIGH (ref 65–99)

## 2017-06-14 LAB — CBC WITH DIFFERENTIAL/PLATELET
BASOS ABS: 0 10*3/uL (ref 0.0–0.1)
Basophils Relative: 0 %
EOS ABS: 0.1 10*3/uL (ref 0.0–0.7)
EOS PCT: 1 %
HCT: 30 % — ABNORMAL LOW (ref 36.0–46.0)
HEMOGLOBIN: 9.2 g/dL — AB (ref 12.0–15.0)
LYMPHS ABS: 1.1 10*3/uL (ref 0.7–4.0)
LYMPHS PCT: 17 %
MCH: 27.1 pg (ref 26.0–34.0)
MCHC: 30.7 g/dL (ref 30.0–36.0)
MCV: 88.2 fL (ref 78.0–100.0)
Monocytes Absolute: 0.4 10*3/uL (ref 0.1–1.0)
Monocytes Relative: 6 %
NEUTROS PCT: 76 %
Neutro Abs: 4.5 10*3/uL (ref 1.7–7.7)
PLATELETS: 129 10*3/uL — AB (ref 150–400)
RBC: 3.4 MIL/uL — ABNORMAL LOW (ref 3.87–5.11)
RDW: 16.6 % — ABNORMAL HIGH (ref 11.5–15.5)
WBC: 6 10*3/uL (ref 4.0–10.5)

## 2017-06-14 LAB — BASIC METABOLIC PANEL
ANION GAP: 13 (ref 5–15)
BUN: 30 mg/dL — ABNORMAL HIGH (ref 6–20)
CALCIUM: 9.2 mg/dL (ref 8.9–10.3)
CO2: 25 mmol/L (ref 22–32)
CREATININE: 2.24 mg/dL — AB (ref 0.44–1.00)
Chloride: 104 mmol/L (ref 101–111)
GFR calc Af Amer: 24 mL/min — ABNORMAL LOW (ref >60)
GFR, EST NON AFRICAN AMERICAN: 21 mL/min — AB (ref >60)
GLUCOSE: 96 mg/dL (ref 65–99)
Potassium: 3 mmol/L — ABNORMAL LOW (ref 3.5–5.1)
Sodium: 142 mmol/L (ref 135–145)

## 2017-06-14 LAB — TROPONIN I
Troponin I: 0.89 ng/mL (ref <0.03)
Troponin I: 1.08 ng/mL (ref <0.03)
Troponin I: 0.98 ng/mL (ref <0.03)

## 2017-06-14 LAB — HEPARIN LEVEL (UNFRACTIONATED): Heparin Unfractionated: <0.1 IU/mL — ABNORMAL LOW (ref 0.30–0.70)

## 2017-06-14 LAB — MAGNESIUM: Magnesium: 2 mg/dL (ref 1.7–2.4)

## 2017-06-14 LAB — PHOSPHORUS: PHOSPHORUS: 3.9 mg/dL (ref 2.5–4.6)

## 2017-06-14 MED ORDER — HEPARIN BOLUS VIA INFUSION
1800.0000 [IU] | Freq: Once | INTRAVENOUS | Status: AC
  Administered 2017-06-14: 1800 [IU] via INTRAVENOUS
  Filled 2017-06-14: qty 1800

## 2017-06-14 MED ORDER — POTASSIUM CHLORIDE CRYS ER 20 MEQ PO TBCR: 40.0000 meq | EXTENDED_RELEASE_TABLET | Freq: Once | ORAL | Status: DC

## 2017-06-14 MED ORDER — FUROSEMIDE 40 MG PO TABS
40.0000 mg | ORAL_TABLET | Freq: Two times a day (BID) | ORAL | Status: DC
  Administered 2017-06-14 (×2): 40 mg via ORAL
  Filled 2017-06-14 (×3): qty 1

## 2017-06-14 MED ORDER — POTASSIUM CHLORIDE CRYS ER 20 MEQ PO TBCR
40.0000 meq | EXTENDED_RELEASE_TABLET | Freq: Once | ORAL | Status: AC
  Administered 2017-06-14: 40 meq via ORAL
  Filled 2017-06-14: qty 2

## 2017-06-14 MED ORDER — CARVEDILOL 6.25 MG PO TABS
6.2500 mg | ORAL_TABLET | Freq: Two times a day (BID) | ORAL | Status: DC
  Administered 2017-06-14: 6.25 mg via ORAL
  Filled 2017-06-14 (×3): qty 1

## 2017-06-14 MED ORDER — HYDRALAZINE HCL 50 MG PO TABS: 75.0000 mg | ORAL_TABLET | Freq: Three times a day (TID) | ORAL | Status: DC

## 2017-06-14 MED ORDER — HEPARIN (PORCINE) IN NACL 100-0.45 UNIT/ML-% IJ SOLN
1300.0000 [IU]/h | INTRAMUSCULAR | Status: DC
  Administered 2017-06-14: 750 [IU]/h via INTRAVENOUS
  Administered 2017-06-15: 950 [IU]/h via INTRAVENOUS
  Administered 2017-06-16: 1300 [IU]/h via INTRAVENOUS
  Filled 2017-06-14 (×3): qty 250

## 2017-06-14 MED ORDER — POTASSIUM CHLORIDE CRYS ER 20 MEQ PO TBCR
40.0000 meq | EXTENDED_RELEASE_TABLET | Freq: Once | ORAL | Status: AC
Start: 2017-06-14 — End: 2017-06-14
  Administered 2017-06-14: 40 meq via ORAL
  Filled 2017-06-14: qty 2

## 2017-06-14 MED ORDER — ATORVASTATIN CALCIUM 80 MG PO TABS
80.0000 mg | ORAL_TABLET | Freq: Every day | ORAL | Status: DC
  Administered 2017-06-14 – 2017-06-17 (×4): 80 mg via ORAL
  Filled 2017-06-14 (×5): qty 1

## 2017-06-14 MED ORDER — MAGNESIUM SULFATE IN D5W 1-5 GM/100ML-% IV SOLN
1.0000 g | Freq: Once | INTRAVENOUS | Status: AC
  Administered 2017-06-14: 1 g via INTRAVENOUS
  Filled 2017-06-14: qty 100

## 2017-06-14 MED ORDER — ISOSORBIDE MONONITRATE ER 30 MG PO TB24
30.0000 mg | ORAL_TABLET | Freq: Every day | ORAL | Status: DC
  Administered 2017-06-14 – 2017-06-19 (×6): 30 mg via ORAL
  Filled 2017-06-14 (×6): qty 1

## 2017-06-14 MED ORDER — HEPARIN BOLUS VIA INFUSION
2000.0000 [IU] | Freq: Once | INTRAVENOUS | Status: AC
  Administered 2017-06-14: 2000 [IU] via INTRAVENOUS
  Filled 2017-06-14: qty 2000

## 2017-06-14 MED ORDER — HYDRALAZINE HCL 50 MG PO TABS
75.0000 mg | ORAL_TABLET | Freq: Three times a day (TID) | ORAL | Status: DC
  Administered 2017-06-14 – 2017-06-19 (×15): 75 mg via ORAL
  Filled 2017-06-14 (×15): qty 1

## 2017-06-14 NOTE — Progress Notes (Signed)
ANTICOAGULATION CONSULT NOTE - Follow Up Consult  Pharmacy Consult for Heparin Indication: chest pain/ACS  Allergies  Allergen Reactions  . Phenytoin Sodium Extended Itching  . Latex Swelling    Patient Measurements: Height: 5' (152.4 cm) Weight: 168 lb 3.2 oz (76.3 kg) IBW/kg (Calculated) : 45.5 Heparin Dosing Weight:  62.6 kg  Vital Signs: Temp: 100.3 F (37.9 C) (01/30 1930) Temp Source: Oral (01/30 1930) BP: 157/61 (01/30 1930) Pulse Rate: 96 (01/30 1930)  Labs: Recent Labs    06/12/17 0523 06/12/17 1033 06/13/17 0518  06/13/17 1156  06/13/17 2316 06/14/17 0448 06/14/17 0618 06/14/17 0806 06/14/17 1115 06/14/17 1956  HGB 9.8* 10.3*  --   --  9.9*  --   --   --   --  9.2*  --   --   HCT 31.7* 33.3*  --   --  32.2*  --   --   --   --  30.0*  --   --   PLT 159 175  --   --  157  --   --   --   --  129*  --   --   LABPROT 14.9  --   --   --   --   --   --   --   --   --   --   --   INR 1.18  --   --   --   --   --   --   --   --   --   --   --   HEPARINUNFRC  --   --   --   --   --   --   --   --   --   --   --  <0.10*  CREATININE 2.22* 2.35* 2.42*  --   --   --   --  2.24*  --   --   --   --   TROPONINI  --   --  0.10*   < >  --    < > 0.89*  --  1.08*  --  0.98*  --    < > = values in this interval not displayed.    Estimated Creatinine Clearance: 21 mL/min (A) (by C-G formula based on SCr of 2.24 mg/dL (H)).   Assessment: Anticoag: subQ hep for DVT ppx >> hep gtt for ACS (increase trop, runs of arrhythmias overnight, EKG nonspecific ST-T changes similar to prior).  - 1/30: RN note about Pressure dressing applied to dry hematoma to right brachial cath site. First heparin level undetectable at only 10 unit/kg of TBW.  Goal of Therapy:  Heparin level 0.3-0.7 units/ml Monitor platelets by anticoagulation protocol: Yes   Plan:  Heparin 1800 unit bolus Increase infusion to 950 units/hr Check heparin level and CBC in AM  Stacy Smith S. Stacy Smith, PharmD,  BCPS Clinical Staff Pharmacist Pager [phone]  Stacy Smith 06/14/2017,9:31 PM

## 2017-06-14 NOTE — Progress Notes (Signed)
Pressure dressing applied to dry hematoma to right brachial cath site. Pt will begin heparin drip today, per Dr Aundra Dubin ok to start drip just monitor site. Restricted extremity band applied to RUE, as was previously missing.

## 2017-06-14 NOTE — Progress Notes (Signed)
Recheck of right brachial hematoma shows no sign of increasing/worsening. Pressure dressing still intact.

## 2017-06-14 NOTE — Progress Notes (Signed)
OT Cancellation Note  Patient Details Name: Stacy Smith MRN: [id number] DOB: 1946/03/11   Cancelled Treatment:    Reason Eval/Treat Not Completed: Other (comment).  Pt with chest pain yesterday and this morning with 2 tachyarrhythmias this morning. Troponins rising. Heparin being started this am. Will cancel at this time and await for medical clearance to work with pt. Thanks    [place], OT/L  [phone] 06/14/2017 06/14/2017, 11:46 AM

## 2017-06-14 NOTE — Progress Notes (Signed)
   Contacted by RN re:  SVT Pt also had some VT K+ 3.0 today Will give 40 meq now and at 9 am Otherwise, per CHF team.  Rosaria Ferries, PA-C 06/14/2017 6:43 AM Beeper [phone]

## 2017-06-14 NOTE — Progress Notes (Signed)
Patient ID: Stacy Smith, female   DOB: [birth year], 72 y.o.   MRN: [id number]     Advanced Heart Failure Rounding Note   HF Cardiology: Aundra Dubin   Subjective:    Afebrile overnight.  Troponin rose 0.89 => 1.08.  She had 2 arrhythmias early this am, 1 appeared to be a run of SVT.  The other was probably NSVT (QRS not that wide but axis changed).  Per nurse, she had chest tightness during tachycardia but not before or after.    Weight down but I/Os do not appear to be recorded well. Creatinine down to 2.24.   This morning, she is sedated (has multiple sedating meds prn).  She wakes up then falls asleep, not able to give much history.  Says no chest pain right now and no dyspnea.   ECG this morning: NSR, nonspecific ST-T changes (similar to prior).   RHC (1/18): mean RA 8, PA 78/25 mean 45, mean PCWP 18, CI 3.6, PVR 4.6 WU Echo (9/18): EF 60-65%, normal RV systolic function, PASP 58, mild TR and MR.  Echo (1/19): EF 65-70%, mild aortic stenosis, mildly dilated RV normal systolic function, PASP 60 mmHg.  V/Q scan (1/19): No PE Abdominal US (1/19): Liver appears cirrhotic. No gallstones  RHC Procedural Findings (06/12/17): Hemodynamics (mmHg) RA mean 13 RV 65/15 PA 61/27, mean 39 PCWP mean 20 Oxygen saturations: PA 80% AO 97% Cardiac Output (Fick) 10.1  Cardiac Index (Fick) 5.87 PVR 1.9 WU Cardiac Output (Thermo) 9.23 Cardiac Output (Thermo) 5.34 PVR 2 WU  Objective:   Weight Range: 168 lb 3.2 oz (76.3 kg) Body mass index is 32.85 kg/m.   Vital Signs:   Temp:  [98 F (36.7 C)-99.3 F (37.4 C)] 98 F (36.7 C) (01/30 0431) Pulse Rate:  [101-114] 101 (01/30 0431) Resp:  [18] 18 (01/30 0431) BP: (178-198)/(60-83) 178/69 (01/30 0431) SpO2:  [92 %-98 %] 98 % (01/30 0431) Weight:  [168 lb 3.2 oz (76.3 kg)] 168 lb 3.2 oz (76.3 kg) (01/30 0431) Last BM Date: 06/10/17  Weight change: Filed Weights   06/12/17 0537 06/13/17 0513 06/14/17 0431  Weight: 166 lb (75.3 kg) 170 lb  3.1 oz (77.2 kg) 168 lb 3.2 oz (76.3 kg)    Intake/Output:   Intake/Output Summary (Last 24 hours) at 06/14/2017 0831 Last data filed at 06/14/2017 0300 Gross per 24 hour  Intake -  Output 900 ml  Net -900 ml      Physical Exam    General: NAD Neck: No JVD, no thyromegaly or thyroid nodule.  Lungs: Clear to auscultation bilaterally with normal respiratory effort. CV: Nondisplaced PMI.  Heart regular S1/S2, no S3/S4, no murmur.  No peripheral edema.   Abdomen: Soft, nontender, no hepatosplenomegaly, no distention.  Skin: Intact without lesions or rashes.  Neurologic: Drowsy, will awaken briefly and answer questions Extremities: No clubbing or cyanosis.  HEENT: Normal.    Telemetry   NSR 80s-90s currently.  Run of   Labs    CBC Recent Labs    06/12/17 1033 06/13/17 1156  WBC 7.0 6.7  HGB 10.3* 9.9*  HCT 33.3* 32.2*  MCV 88.3 88.7  PLT 175 409   Basic Metabolic Panel Recent Labs    06/13/17 0518 06/14/17 0448  NA 141 142  K 3.8 3.0*  CL 104 104  CO2 20* 25  GLUCOSE 294* 96  BUN 28* 30*  CREATININE 2.42* 2.24*  CALCIUM 9.1 9.2  MG  --  2.0   Liver Function Tests  No results for input(s): AST, ALT, ALKPHOS, BILITOT, PROT, ALBUMIN in the last 72 hours. No results for input(s): LIPASE, AMYLASE in the last 72 hours. Cardiac Enzymes Recent Labs    06/13/17 1726 06/13/17 2316 06/14/17 0618  TROPONINI 0.39* 0.89* 1.08*    BNP: BNP (last 3 results) Recent Labs    06/10/17 1538 06/11/17 0632  BNP 394.7* 470.6*    ProBNP (last 3 results) No results for input(s): PROBNP in the last 8760 hours.   D-Dimer No results for input(s): DDIMER in the last 72 hours. Hemoglobin A1C No results for input(s): HGBA1C in the last 72 hours. Fasting Lipid Panel Recent Labs    06/14/17 0448  CHOL 166  HDL 42  LDLCALC 93  TRIG 156*  CHOLHDL 4.0   Thyroid Function Tests No results for input(s): TSH, T4TOTAL, T3FREE, THYROIDAB in the last 72  hours.  Invalid input(s): FREET3  Other results:   Imaging    Dg Chest 2 View  Result Date: 06/13/2017 CLINICAL DATA:  Hospital admission on 06/10/2017 due to pulmonary arterial hypertension. Acute onset of vomiting last night. EXAM: CHEST  2 VIEW COMPARISON:  06/10/2017, 01/25/2015 and earlier. FINDINGS: AP erect and lateral images were obtained. Suboptimal inspiration accounts for crowded bronchovascular markings, especially in the bases, and accentuates the cardiac silhouette. Taking this into account, cardiac silhouette upper normal in size to slightly enlarged. Thoracic aorta mildly atherosclerotic, unchanged. Hilar and mediastinal contours otherwise unremarkable. Improved central peribronchial thickening since the examination 3 days ago. Lungs clear. Bronchovascular markings now normal. Pulmonary vascularity normal. No visible pleural effusions. No pneumothorax. Slight exaggeration of the usual thoracic kyphosis may be in part positional. IMPRESSION: Suboptimal inspiration. Stable borderline heart size. No acute cardiopulmonary disease. Electronically Signed   By: Evangeline Dakin M.D.   On: 06/13/2017 18:59     Medications:     Scheduled Medications: . amLODipine  5 mg Oral BID  . aspirin EC  81 mg Oral Daily  . atorvastatin  80 mg Oral q1800  . calcitRIOL  0.5 mcg Oral Once per day on Mon Fri  . carvedilol  6.25 mg Oral BID WC  . cholecalciferol  1,000 Units Oral BID  . clonazePAM  0.5 mg Oral BID  . docusate sodium  200 mg Oral QHS  . ferrous sulfate  325 mg Oral BID WC  . furosemide  40 mg Oral BID  . heparin  5,000 Units Subcutaneous Q8H  . hydrALAZINE  75 mg Oral Q8H  . insulin aspart  0-15 Units Subcutaneous TID WC  . insulin aspart  3 Units Subcutaneous TID WC  . insulin detemir  20 Units Subcutaneous BID  . isosorbide mononitrate  30 mg Oral Daily  . potassium chloride  40 mEq Oral Once  . potassium chloride  40 mEq Oral Once  . sertraline  100 mg Oral Daily  .  sodium chloride flush  3 mL Intravenous Q12H  . sodium chloride flush  3 mL Intravenous Q12H  . traZODone  50 mg Oral QHS  . trimethoprim-polymyxin b  2 drop Both Eyes BID    Infusions: . sodium chloride    . magnesium sulfate 1 - 4 g bolus IVPB      PRN Medications: sodium chloride, acetaminophen **OR** acetaminophen, acetaminophen, cloNIDine, ipratropium-albuterol, ondansetron (ZOFRAN) IV, oxyCODONE, sodium chloride flush    Patient Profile   72 yo with history of CKD stage IV, diastolic CHF, HTN, ?cirrhosis, pulmonary hypertension was admitted with acute on chronic diastolic CHF.  Assessment/Plan   1. Acute on chronic diastolic CHF: Echo 5/17 with EF 65-70%, mildly dilated RV.  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 [place] 06/12/17, however, showed pulmonary venous hypertension and elevated filling pressures with PVR about 2 WU.  I think she has diuresed reasonably well though I/Os not recorded, weight is down.  - Stop IV Lasix, transition to Lasix 40 mg po bid.  - She may benefit from Cardiomems as outpatient => will workup for this.  2. 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  Echo with mildly dilated RV.  V/Q scan with no evidence for chronic PE. RHC 1/19 (this admission), however, showed primarily pulmonary venous hypertension with PVR only 2 WU. - Treatment at this point will be diuretics rather than selective pulmonary vasodilators.   No change.  3. CKD: Stage 3-4.  Creatinine down from 2.4 => 2.2 today.  Transition to po diuretics. Followed by nephrology in [place].  4. HTN: BP remains very high (SBP 180s-190s).   - Will add Coreg 6.25 mg bid today.  - Increase hydralazine to 75 mg tid.  5. Cirrhosis: RUQ [place] suggestive of cirrhosis.  6. Abd pain: RUQ [place] did not show evidence of cholecystitis.  - Continue as needed Zofran.  7.  ID: Fever 1/29.  CXR unremarkable, UA negative.  Cultures so far negative. Currently afebrile with normal WBCs.  8. Elevated troponin: Had episode of chest pain early am 1/29 in setting of nausea/vomiting.  Had chest pain per nurse again this morning in setting of SVT/NSVT.  ?ACS/NSTEMI versus demand ischemia with volume overload/HTN/tachycardia in setting of elevated creatinine. Troponin significantly elevated up to 1.08 today. Currently chest pain-free.  - Continue to cycle troponin to peak.  - Add Imdur 30 daily.  - Will add heparin gtt for now.  - Change statin to atorvastatin 80 mg daily.  - Add Coreg as above.  - Will need to consider coronary angiography => however, she is at higher risk for contrast nephropathy with CKD stage 3-4.  Creatinine is trending down.  As long as she does not have further chest pain, will follow for now with possible angiography later in week depending on clinical course.  9. SVT/NSVT: Patient appeared to have 2 tachyarrhythmias this morning => there was a narrow complex SVT and there was also another tachyarrhythmia where the axis changed though QRS only mildly widened => possible NSVT.  Recent echo with normal EF.  - Add beta blocker.  - Replace K aggressively and Mg.  - As above, will consider coronary angiography if creatinine continues to trend down.   Length of Stay: [place], MD  06/14/2017, 8:31 AM  Advanced Heart Failure Team Pager [phone] (M-F; 7a - 4p)  Please contact [facility] for night-coverage after hours (4p -7a ) and weekends on [email or web address]

## 2017-06-14 NOTE — Progress Notes (Signed)
Pt had 34 beats run of V-Tach. Denies any kind of pain/Chest pain. EKG has been completed per protocol. No distress noted at this time. MD has been paged twice, waiting for call back. Will continue to monitor the patient.

## 2017-06-14 NOTE — Progress Notes (Signed)
ANTICOAGULATION CONSULT NOTE - Initial Consult  Pharmacy Consult for heparin Indication: chest pain/ACS  Allergies  Allergen Reactions  . Phenytoin Sodium Extended Itching  . Latex Swelling    Patient Measurements: Height: 5' (152.4 cm) Weight: 168 lb 3.2 oz (76.3 kg) IBW/kg (Calculated) : 45.5 Heparin Dosing Weight: 64.4 kg  Vital Signs: Temp: 98 F (36.7 C) (01/30 0431) Temp Source: Oral (01/30 0431) BP: 178/69 (01/30 0431) Pulse Rate: 101 (01/30 0431)  Labs: Recent Labs    06/12/17 0523 06/12/17 1033 06/13/17 0518  06/13/17 1156 06/13/17 1726 06/13/17 2316 06/14/17 0448 06/14/17 0618 06/14/17 0806  HGB 9.8* 10.3*  --   --  9.9*  --   --   --   --  9.2*  HCT 31.7* 33.3*  --   --  32.2*  --   --   --   --  30.0*  PLT 159 175  --   --  157  --   --   --   --  129*  LABPROT 14.9  --   --   --   --   --   --   --   --   --   INR 1.18  --   --   --   --   --   --   --   --   --   CREATININE 2.22* 2.35* 2.42*  --   --   --   --  2.24*  --   --   TROPONINI  --   --  0.10*   < >  --  0.39* 0.89*  --  1.08*  --    < > = values in this interval not displayed.    Estimated Creatinine Clearance: 21 mL/min (A) (by C-G formula based on SCr of 2.24 mg/dL (H)).   Medical History: Past Medical History:  Diagnosis Date  . Anxiety   . Bradycardia   . CHF (congestive heart failure) ([place])   . Chronic kidney disease    CKD Stage 3  . Depression   . Diabetes mellitus without complication ([place])   . Hyperkalemia   . Hypertension   . Lupus   . Peripheral neuropathy   . Rheumatoid arthritis ([place])   . Seizures ([place])   . Shortness of breath dyspnea     Medications:  Scheduled:  . amLODipine  5 mg Oral BID  . aspirin EC  81 mg Oral Daily  . atorvastatin  80 mg Oral q1800  . calcitRIOL  0.5 mcg Oral Once per day on Mon Fri  . carvedilol  6.25 mg Oral BID WC  . cholecalciferol  1,000 Units Oral BID  . clonazePAM  0.5 mg Oral BID  . docusate sodium  200 mg Oral QHS  .  ferrous sulfate  325 mg Oral BID WC  . furosemide  40 mg Oral BID  . heparin  2,000 Units Intravenous Once  . hydrALAZINE  75 mg Oral Q8H  . insulin aspart  0-15 Units Subcutaneous TID WC  . insulin aspart  3 Units Subcutaneous TID WC  . insulin detemir  20 Units Subcutaneous BID  . isosorbide mononitrate  30 mg Oral Daily  . potassium chloride  40 mEq Oral Once  . sertraline  100 mg Oral Daily  . sodium chloride flush  3 mL Intravenous Q12H  . sodium chloride flush  3 mL Intravenous Q12H  . traZODone  50 mg Oral QHS  . trimethoprim-polymyxin b  2 drop Both Eyes BID    Assessment: 70 yof found to have increasing troponin and arrhythmias documented this morning. EKG this morning in NSR with non-specific ST-T changes (similar to prior test). No anticoagulation prior to admission. Last dose of heparin subQ for DVT ppx was this morning at 0537.  Hgb is trending down slightly to 9.2 today, platelets also trending down slightly to 129. Renal function is stable (Scr 2.24, CrCl ~21 mL/min). No signs/symptoms of bleeding.   Goal of Therapy:  Heparin level 0.3-0.7 units/ml Monitor platelets by anticoagulation protocol: Yes   Plan:  Give 2000 units bolus x 1 Start heparin infusion at 750 units/hr Check anti-Xa level in 8 hours and daily while on heparin Continue to monitor H&H and platelets  Doylene Canard, PharmD Clinical Pharmacist  Pager: [phone] Clinical Phone for 06/14/2017 until 3:30pm: [phone] If after 3:30pm, please call main pharmacy at [phone] 06/14/2017,9:31 AM

## 2017-06-14 NOTE — Progress Notes (Signed)
Unable to start Heparin drip due to lack of supplies/IV pump channels on the floor. They have been requested from materials.

## 2017-06-14 NOTE — Care Management Note (Addendum)
Case Management Note  Patient Details  Name: Stacy Smith MRN: [id number] Date of Birth: Mar 15, 1946  Subjective/Objective:                 Patient lives at home alone, sister in law lives next door, is able to come and check on patient before she goes to work. Patient has a daughter that available if needed as well. Patient states that family brings food to the home. Patient currently does not drive. Family also does errands like picking up meds, and getting to MD. Patient has RW and canes at home. Does not have preference for [place]. Lives in [place].   PCP Elvina Mattes   Action/Plan:   FAXED to [facility] at [phone] Facesheet, progress note, PT note.  Please fax [facility] orders and face to face when available.   Expected Discharge Date:                  Expected Discharge Plan:  [place]  In-House Referral:     Discharge planning Services  CM Consult  Post Acute Care Choice:    Choice offered to:     DME Arranged:    DME Agency:     HH Arranged:    [facility] Agency:     Status of Service:  In process, will continue to follow  If discussed at Long Length of Stay Meetings, dates discussed:    Additional Comments:  Carles Collet, RN 06/14/2017, 2:27 PM

## 2017-06-14 NOTE — Care Management Important Message (Signed)
Important Message  Patient Details  Name: Stacy Smith MRN: [id number] Date of Birth: 1946/01/18   Medicare Important Message Given:  Yes    Orbie Pyo 06/14/2017, 12:53 PM

## 2017-06-14 NOTE — Progress Notes (Signed)
PROGRESS NOTE    Stacy Smith  NFA:[id number] DOB: 21-Aug-1945 DOA: 06/10/2017 PCP: Jacqualine Code, DO   Brief Narrative:  Stacy Smith is an 72 y.o. female past medical history significant for pulmonary hypertension, with a right heart cath in January 2018 that showed pressure of 74,, chronic diastolic heart failure ,diabetes mellitus with chronic renal disease stage III and other comorbids who presents to the ED with dyspnea and lower extremity edema. Found to have an Acute Decompensation of Diastolic CHF and Pulmonary HTN. She was found to have an Elevated Troponin and ?CP so Cardiology started her on Heparin gtt.   Assessment & Plan:   Principal Problem:   Major depression, recurrent, chronic (HCC) Active Problems:   Essential hypertension   Hyperlipidemia   Acute kidney injury superimposed on chronic kidney disease (HCC)   Diabetes type 2, uncontrolled (HCC)   Hyponatremia   Iron deficiency anemia due to chronic blood loss   Pulmonary HTN (HCC)   Acute diastolic CHF (congestive heart failure) (HCC)  Acute Decompensation of Diastolic CHF (congestive Heart Failure) ([place]): -VQ scan was normal  -Right heart cath showed moderate pulmonary venous hypertension,  -2D echo showed an EF of 65-75% with grade 1 diastolic heart failure with right-sided showed only mildly dilated right ventricle. -Cardiology was consulted recommended IV diuresis. IV diuresis changed to po Lasix 40 mg BID by Cardiology -Right heart cath on 06/12/2016 showed pulmonary venous hypertension with an elevated filling pressure.. -Strict I's/O's, Daily Weights SLIV -Patient is -5.432 Liters and Weight is down 12 lbs -Cardiology adding Carvedilol 6.25 mg po BID -Continue to Monitor Volume Status closely   Pulmonary Hypertension -Right heart cath on the 28th showed a mixed picture -ECHO showed mildly dilated RV -Cardiology recommended IV diuresis but now changed to po Lasix 40 mg po BID. -Per Cardiology,  treatment will be diuretics rather than selective pulmonary vasodilators at this point  Acute kidney injury on chronic kidney disease stage IV -Baseline creatinine 1.8-1.9 -Creatinine has improved today and went from 2.35 -> 2.42 -> 2.24 -Nephrology was consulted who agreed with IV diuresis now they have signed off. Cardiology changing IV diuresis to po Lasix today -Continue to monitor and repeat CMP in AM   Essential Hypertension -C/w Amlodipine 5 mg po BID, Clonidine 0.2 mg po 4 Times daily PRN SBP>170, Isosorbide Mononitrate 30 mg po Daily, and Hydralazine 75 mg po q8h (increased by Cardiology)  -Cardiology adding Carvedilol 6.25 mg po BID -C/w Furosemide 40 mg po BID  Hyperlipidemia -Lipid Panel showed Cholesterol of 166, HDL of 42, LDL 93, TG of 156, and VLDL of 31  -C/w Atorvastatin 80 mg po Daily  Normocytic Anemia -Decreased TIBC only, would favor due to chronic renal disease. -Hb/Hct went from 10.3/33.3 -> 9.9/32.2 -Continue to Monitor for S/Sx of Bleeding now that patient is being started on Heparin gtt -C/w Ferrous Sulfate 325 mg po BIDwm -Follow-up with Nephrology as an outpatient.  Major Depressive Disorder: -Psychiatry was consulted recommended Clonazepam 0.5 mg BID -Continue Sertraline 100 mg po Daily.  Rashes: Follow-up with PCP as an outpatient.  Cirrhosis: -U/S Abd RUQ showed Cirrhotic Appearing Liver and no focal lesion; no acute abnormalities and Negative for Gallstones -INR and platelets are stable -Check Ammonia Level in AM -Check PT-INR in Am -Platelet Count was 129  Hypokalemia -Patient's K+ was 3.0 -Replete with po KCl 40 meq x3 today -Continue to Monitor and Replete as Necessary -Repeat CMP in AM  Elevated Troponin -Patient/s Troponin  peaked at 1.08 -Cardiology starting Heparin gtt -C/w ASA 81 mg po Daily, Atorvastatin 80 mg po Daily, and added Carvedilol 6.25 mg po BID -Cardiology also adding Imdur 30 mg po Daily  -Per Cardiology will  hold on possible angiography for now as long as she does not have CP as she is at risk for Contrast Nephropathy   Tachyarrhythmias -Cardiology adding BB with Carvedilol 6.25 mg BID -Replete Electrolytes -Repeat EKG in AM   Diabetes Mellitus Type 2 -CBG's ranging from 99-208 -C/w Insulin Detemir 20 units sq BID and Novolog 3 units TIDwm, -C/w Moderate Novolog SSI   DVT prophylaxis: Anticoagulated with Heparin gtt Code Status: FULL CODE Family Communication: Discuss with Brother at bedside Disposition Plan: [place] for continued workup and treatment   Consultants:   Cardiology CHF Team  Psychiatry  Nephrology   Procedures:  ECHOCARDIOGRAM ------------------------------------------------------------------- Study Conclusions  - Left ventricle: The cavity size was normal. Wall thickness was   increased in a pattern of mild LVH. Systolic function was   vigorous. The estimated ejection fraction was in the range of 65%   to 70%. Wall motion was normal; there were no regional wall   motion abnormalities. Doppler parameters are consistent with   abnormal left ventricular relaxation (grade 1 diastolic   dysfunction). Doppler parameters are consistent with high   ventricular filling pressure. - Aortic valve: There was mild stenosis. - Mitral valve: Calcified annulus. - Left atrium: The atrium was mildly dilated. - Right ventricle: The cavity size was mildly dilated. - Right atrium: The atrium was mildly dilated. - Pulmonary arteries: Systolic pressure was moderately to severely   increased. PA peak pressure: 60 mm Hg (S).  Impressions:  - Vigorous LV systolic function; mild diastolic dysfunction with   elevated LV filling pressure; mild LVH; elevated LVOT gradient   (mean gradient 17 mmHg) at least partially explained by vigorous   LV function; aortic valve not well visualized but appears to open   well; mild biatrial enlargement; mild RVE; mild TR with moderate    to severe pulmonary hypertension.  RIGHT HEART CATHETERIZATION 1. Moderate pulmonary venous hypertension.  2. Elevated R > L heart filling pressures 3. Preserved cardiac output.   Antimicrobials:  Anti-infectives (From admission, onward)   None     Subjective: Seen and examined at bedside and was feeling extremely sleepy and would fall asleep during questioning. Currently denying any CP. No Nausea.   Objective: Vitals:   06/13/17 1846 06/13/17 1917 06/13/17 2101 06/14/17 0431  BP: (!) 189/61 (!) 194/64 (!) 191/83 (!) 178/69  Pulse:  (!) 112 (!) 102 (!) 101  Resp:  18  18  Temp:  99.3 F (37.4 C)  98 F (36.7 C)  TempSrc:  Oral  Oral  SpO2:  96%  98%  Weight:    76.3 kg (168 lb 3.2 oz)  Height:        Intake/Output Summary (Last 24 hours) at 06/14/2017 2353 Last data filed at 06/14/2017 0300 Gross per 24 hour  Intake -  Output 900 ml  Net -900 ml   Filed Weights   06/12/17 0537 06/13/17 0513 06/14/17 0431  Weight: 75.3 kg (166 lb) 77.2 kg (170 lb 3.1 oz) 76.3 kg (168 lb 3.2 oz)   Examination: Physical Exam:  Constitutional: WN/WD disheveled appearing Caucasian female in NAD and appears calm and comfortable Eyes: Lids and conjunctivae normal, sclerae anicteric  ENMT: External Ears, Nose appear normal. Grossly normal hearing. Mucous membranes are moist.  Neck: Appears normal, supple, no cervical masses, normal ROM, no appreciable thyromegaly, no JVD Respiratory: Diminished to auscultation bilaterally, no wheezing, rales, rhonchi or crackles. Normal respiratory effort and patient is not tachypenic. No accessory muscle use.  Cardiovascular: RRR, no murmurs / rubs / gallops. S1 and S2 auscultated. Trace extremity edema.  Abdomen: Soft, non-tender, non-distended. No masses palpated. No appreciable hepatosplenomegaly. Bowel sounds positive.  GU: Deferred. Musculoskeletal: No clubbing / cyanosis of digits/nails. No joint deformity upper and lower extremities. Good ROM, no  contractures.  Skin: No ecchymosis appreciated. No induration; Warm and dry.  Neurologic: Patient is Sleepy but CN 2-12 grossly intact with no focal deficits. Romberg sign cerebellar reflexes not assessed.  Psychiatric: Normal judgment and insight. Somnolent but arousable. Oriented x 3. Normal mood and appropriate affect.   Data Reviewed: I have personally reviewed following labs and imaging studies  CBC: Recent Labs  Lab 06/10/17 1631 06/11/17 0632 06/12/17 0523 06/12/17 1033 06/13/17 1156  WBC 4.7 5.0 5.3 7.0 6.7  HGB 8.9* 9.7* 9.8* 10.3* 9.9*  HCT 28.9* 31.5* 31.7* 33.3* 32.2*  MCV 87.0 87.0 88.3 88.3 88.7  PLT 135* 163 159 175 725   Basic Metabolic Panel: Recent Labs  Lab 06/10/17 1037  06/11/17 0632 06/12/17 0523 06/12/17 1033 06/13/17 0518 06/14/17 0448  NA 129*  --  136 141  --  141 142  K 4.1  --  3.7 3.8  --  3.8 3.0*  CL 97*  --  100* 105  --  104 104  CO2 18*  --  22 24  --  20* 25  GLUCOSE 348*  --  102* 125*  --  294* 96  BUN 25*  --  23* 23*  --  28* 30*  CREATININE 2.43*   < > 2.32* 2.22* 2.35* 2.42* 2.24*  CALCIUM 8.3*  --  8.8* 8.8*  --  9.1 9.2  MG  --   --   --   --   --   --  2.0   < > = values in this interval not displayed.   GFR: Estimated Creatinine Clearance: 21 mL/min (A) (by C-G formula based on SCr of 2.24 mg/dL (H)). Liver Function Tests: Recent Labs  Lab 06/10/17 1233  AST 25  ALT 13*  ALKPHOS 79  BILITOT 0.7  PROT 6.8  ALBUMIN 2.9*   No results for input(s): LIPASE, AMYLASE in the last 168 hours. Recent Labs  Lab 06/10/17 1233  AMMONIA 49*   Coagulation Profile: Recent Labs  Lab 06/12/17 0523  INR 1.18   Cardiac Enzymes: Recent Labs  Lab 06/13/17 0518 06/13/17 1100 06/13/17 1726 06/13/17 2316 06/14/17 0618  TROPONINI 0.10* 0.11* 0.39* 0.89* 1.08*   BNP (last 3 results) No results for input(s): PROBNP in the last 8760 hours. HbA1C: No results for input(s): HGBA1C in the last 72 hours. CBG: Recent Labs  Lab  06/12/17 2123 06/13/17 0742 06/13/17 1106 06/13/17 1650 06/13/17 2115  GLUCAP 184* 263* 229* 146* 103*   Lipid Profile: Recent Labs    06/14/17 0448  CHOL 166  HDL 42  LDLCALC 93  TRIG 156*  CHOLHDL 4.0   Thyroid Function Tests: No results for input(s): TSH, T4TOTAL, FREET4, T3FREE, THYROIDAB in the last 72 hours. Anemia Panel: Recent Labs    06/11/17 1223  FERRITIN 122  TIBC 223*  IRON 41   Sepsis Labs: No results for input(s): PROCALCITON, LATICACIDVEN in the last 168 hours.  No results found for this or any previous  visit (from the past 240 hour(s)).   Radiology Studies: Dg Chest 2 View  Result Date: 06/13/2017 CLINICAL DATA:  Hospital admission on 06/10/2017 due to pulmonary arterial hypertension. Acute onset of vomiting last night. EXAM: CHEST  2 VIEW COMPARISON:  06/10/2017, 01/25/2015 and earlier. FINDINGS: AP erect and lateral images were obtained. Suboptimal inspiration accounts for crowded bronchovascular markings, especially in the bases, and accentuates the cardiac silhouette. Taking this into account, cardiac silhouette upper normal in size to slightly enlarged. Thoracic aorta mildly atherosclerotic, unchanged. Hilar and mediastinal contours otherwise unremarkable. Improved central peribronchial thickening since the examination 3 days ago. Lungs clear. Bronchovascular markings now normal. Pulmonary vascularity normal. No visible pleural effusions. No pneumothorax. Slight exaggeration of the usual thoracic kyphosis may be in part positional. IMPRESSION: Suboptimal inspiration. Stable borderline heart size. No acute cardiopulmonary disease. Electronically Signed   By: Evangeline Dakin M.D.   On: 06/13/2017 18:59   Scheduled Meds: . amLODipine  5 mg Oral BID  . aspirin EC  81 mg Oral Daily  . calcitRIOL  0.5 mcg Oral Once per day on Mon Fri  . cholecalciferol  1,000 Units Oral BID  . clonazePAM  0.5 mg Oral BID  . docusate sodium  200 mg Oral QHS  . ferrous  sulfate  325 mg Oral BID WC  . furosemide  80 mg Intravenous BID  . heparin  5,000 Units Subcutaneous Q8H  . hydrALAZINE  50 mg Oral Q8H  . insulin aspart  0-15 Units Subcutaneous TID WC  . insulin aspart  3 Units Subcutaneous TID WC  . insulin detemir  20 Units Subcutaneous BID  . potassium chloride  40 mEq Oral Once  . pravastatin  80 mg Oral QHS  . sertraline  100 mg Oral Daily  . sodium chloride flush  3 mL Intravenous Q12H  . sodium chloride flush  3 mL Intravenous Q12H  . traZODone  50 mg Oral QHS  . trimethoprim-polymyxin b  2 drop Both Eyes BID   Continuous Infusions: . sodium chloride      LOS: 4 days   Kerney Elbe, DO [facility] Pager [phone]  If 7PM-7AM, please contact night-coverage [email or web address] Password [email or web address] 06/14/2017, 8:08 AM

## 2017-06-14 NOTE — Progress Notes (Signed)
Pt had another 20 beats run of V-Tach and both the hospitalist and the cardiologists were notified. New orders were obtained and transcribed. Pt is not in any distress or discomfort at this time. Will continue monitoring.

## 2017-06-14 NOTE — Progress Notes (Signed)
The patient had SVT that lasted about a minute. Patient is C/O palpitations but alert/oriented X4. Cardiologist has been paged, waiting for response.

## 2017-06-15 LAB — COMPREHENSIVE METABOLIC PANEL
ALBUMIN: 2.8 g/dL — AB (ref 3.5–5.0)
ALT: 15 U/L (ref 14–54)
AST: 37 U/L (ref 15–41)
Alkaline Phosphatase: 67 U/L (ref 38–126)
Anion gap: 11 (ref 5–15)
BUN: 31 mg/dL — AB (ref 6–20)
CHLORIDE: 102 mmol/L (ref 101–111)
CO2: 24 mmol/L (ref 22–32)
CREATININE: 2.23 mg/dL — AB (ref 0.44–1.00)
Calcium: 8.7 mg/dL — ABNORMAL LOW (ref 8.9–10.3)
GFR calc Af Amer: 24 mL/min — ABNORMAL LOW (ref >60)
GFR, EST NON AFRICAN AMERICAN: 21 mL/min — AB (ref >60)
GLUCOSE: 155 mg/dL — AB (ref 65–99)
POTASSIUM: 3.8 mmol/L (ref 3.5–5.1)
Sodium: 137 mmol/L (ref 135–145)
Total Bilirubin: 0.9 mg/dL (ref 0.3–1.2)
Total Protein: 6.4 g/dL — ABNORMAL LOW (ref 6.5–8.1)

## 2017-06-15 LAB — CBC WITH DIFFERENTIAL/PLATELET
Basophils Absolute: 0 10*3/uL (ref 0.0–0.1)
Basophils Relative: 0 %
Eosinophils Absolute: 0.1 10*3/uL (ref 0.0–0.7)
Eosinophils Relative: 2 %
HCT: 33.3 % — ABNORMAL LOW (ref 36.0–46.0)
HEMOGLOBIN: 10.3 g/dL — AB (ref 12.0–15.0)
LYMPHS ABS: 0.8 10*3/uL (ref 0.7–4.0)
LYMPHS PCT: 11 %
MCH: 27.2 pg (ref 26.0–34.0)
MCHC: 30.9 g/dL (ref 30.0–36.0)
MCV: 88.1 fL (ref 78.0–100.0)
Monocytes Absolute: 0.4 10*3/uL (ref 0.1–1.0)
Monocytes Relative: 6 %
NEUTROS PCT: 81 %
Neutro Abs: 6.1 10*3/uL (ref 1.7–7.7)
Platelets: 138 10*3/uL — ABNORMAL LOW (ref 150–400)
RBC: 3.78 MIL/uL — AB (ref 3.87–5.11)
RDW: 16.5 % — ABNORMAL HIGH (ref 11.5–15.5)
WBC: 7.4 10*3/uL (ref 4.0–10.5)

## 2017-06-15 LAB — RESPIRATORY PANEL BY PCR
ADENOVIRUS-RVPPCR: NOT DETECTED
Bordetella pertussis: NOT DETECTED
CORONAVIRUS 229E-RVPPCR: NOT DETECTED
CORONAVIRUS HKU1-RVPPCR: NOT DETECTED
CORONAVIRUS NL63-RVPPCR: NOT DETECTED
CORONAVIRUS OC43-RVPPCR: NOT DETECTED
Chlamydophila pneumoniae: NOT DETECTED
INFLUENZA B-RVPPCR: NOT DETECTED
Influenza A: NOT DETECTED
Metapneumovirus: NOT DETECTED
Mycoplasma pneumoniae: NOT DETECTED
PARAINFLUENZA VIRUS 1-RVPPCR: NOT DETECTED
PARAINFLUENZA VIRUS 2-RVPPCR: NOT DETECTED
Parainfluenza Virus 3: NOT DETECTED
Parainfluenza Virus 4: NOT DETECTED
RESPIRATORY SYNCYTIAL VIRUS-RVPPCR: DETECTED — AB
Rhinovirus / Enterovirus: NOT DETECTED

## 2017-06-15 LAB — HEPARIN LEVEL (UNFRACTIONATED)
HEPARIN UNFRACTIONATED: 0.23 [IU]/mL — AB (ref 0.30–0.70)
Heparin Unfractionated: 0.18 IU/mL — ABNORMAL LOW (ref 0.30–0.70)

## 2017-06-15 LAB — GLUCOSE, CAPILLARY
Glucose-Capillary: 134 mg/dL — ABNORMAL HIGH (ref 65–99)
Glucose-Capillary: 152 mg/dL — ABNORMAL HIGH (ref 65–99)
Glucose-Capillary: 132 mg/dL — ABNORMAL HIGH (ref 65–99)
Glucose-Capillary: 183 mg/dL — ABNORMAL HIGH (ref 65–99)

## 2017-06-15 LAB — PHOSPHORUS: PHOSPHORUS: 3.7 mg/dL (ref 2.5–4.6)

## 2017-06-15 LAB — INFLUENZA PANEL BY PCR (TYPE A & B)
INFLAPCR: NEGATIVE
INFLBPCR: NEGATIVE

## 2017-06-15 LAB — MAGNESIUM: Magnesium: 2.1 mg/dL (ref 1.7–2.4)

## 2017-06-15 MED ORDER — GUAIFENESIN ER 600 MG PO TB12
1200.0000 mg | ORAL_TABLET | Freq: Two times a day (BID) | ORAL | Status: DC
  Administered 2017-06-15 – 2017-06-19 (×9): 1200 mg via ORAL
  Filled 2017-06-15 (×9): qty 2

## 2017-06-15 MED ORDER — SALINE SPRAY 0.65 % NA SOLN
1.0000 | NASAL | Status: DC | PRN
  Administered 2017-06-15 – 2017-06-16 (×4): 1 via NASAL
  Filled 2017-06-15: qty 44

## 2017-06-15 MED ORDER — DICLOFENAC SODIUM 1 % TD GEL
2.0000 g | Freq: Four times a day (QID) | TRANSDERMAL | Status: DC | PRN
  Administered 2017-06-15 – 2017-06-19 (×8): 2 g via TOPICAL
  Filled 2017-06-15: qty 100

## 2017-06-15 MED ORDER — PROMETHAZINE HCL 25 MG/ML IJ SOLN
12.5000 mg | Freq: Four times a day (QID) | INTRAMUSCULAR | Status: DC | PRN
  Administered 2017-06-15: 12.5 mg via INTRAVENOUS
  Filled 2017-06-15: qty 1

## 2017-06-15 MED ORDER — POTASSIUM CHLORIDE CRYS ER 20 MEQ PO TBCR
40.0000 meq | EXTENDED_RELEASE_TABLET | Freq: Once | ORAL | Status: AC
  Administered 2017-06-15: 40 meq via ORAL
  Filled 2017-06-15: qty 2

## 2017-06-15 MED ORDER — FUROSEMIDE 40 MG PO TABS: 40.0000 mg | ORAL_TABLET | Freq: Every day | ORAL | Status: DC

## 2017-06-15 MED ORDER — ONDANSETRON HCL 4 MG PO TABS: 4.0000 mg | ORAL_TABLET | Freq: Three times a day (TID) | ORAL | Status: DC | PRN

## 2017-06-15 MED ORDER — FUROSEMIDE 40 MG PO TABS
40.0000 mg | ORAL_TABLET | Freq: Once | ORAL | Status: AC
  Administered 2017-06-15: 40 mg via ORAL
  Filled 2017-06-15: qty 1

## 2017-06-15 MED ORDER — AZITHROMYCIN 500 MG PO TABS
500.0000 mg | ORAL_TABLET | Freq: Every day | ORAL | Status: AC
  Administered 2017-06-15 – 2017-06-19 (×5): 500 mg via ORAL
  Filled 2017-06-15 (×5): qty 1

## 2017-06-15 MED ORDER — CARVEDILOL 6.25 MG PO TABS
9.3750 mg | ORAL_TABLET | Freq: Two times a day (BID) | ORAL | Status: DC
  Administered 2017-06-15 – 2017-06-16 (×3): 9.375 mg via ORAL
  Filled 2017-06-15 (×3): qty 1

## 2017-06-15 NOTE — Progress Notes (Signed)
Occupational Therapy Treatment Patient Details Name: Stacy Smith MRN: [id number] DOB: [birth year] Today's Date: 06/15/2017    History of present illness Patient with history of Major depression, pulmonary hypertension with a right heart cath in January 2018 (showing pulmonary artery pressure of 74), type 2 diabetes mellitus with renal insufficiency, essential hypertension, hyperlipidemia, chronic kidney disease stage III, iron deficiency anemia who was admitted due to dyspnea and LE edema.    OT comments  Pt has demonstrated a decline in functional status since initial evaluation and now requires mod A for mobility and at least mod A for ADL. Pt requires encouragement to participate with therapy. Pt will benefit from rehab at SNF to facilitate safe DC home. Will continue to follow acutely. Encourage pt to be OOB to chair with +2 A with nursing using RW.   Follow Up Recommendations  SNF;Supervision/Assistance - 24 hour    Equipment Recommendations  3 in 1 bedside commode    Recommendations for Other Services      Precautions / Restrictions Precautions Precautions: Fall Restrictions Weight Bearing Restrictions: No       Mobility Bed Mobility Overal bed mobility: Needs Assistance Bed Mobility: Supine to Sit Rolling: Mod assist      Sat EOB @ 8 min with S Complained of nausea while sitting but did not vomit     Transfers Overall transfer level: Needs assistance Equipment used: 1 person hand held assist Transfers: Sit to/from Stand Sit to Stand: Mod assist              Balance Overall balance assessment: Needs assistance   Sitting balance-Leahy Scale: Good     Standing balance support: Bilateral upper extremity supported Standing balance-Leahy Scale: Poor Standing balance comment: reliance on UE support                           ADL either performed or assessed with clinical judgement   ADL Overall ADL's : Needs assistance/impaired                      Lower Body Dressing: Moderate assistance   Toilet Transfer: Moderate assistance   Toileting- Clothing Manipulation and Hygiene: Moderate assistance       Functional mobility during ADLs: Moderate assistance(limited to 3 side steps) General ADL Comments: Pt used bedpan as she declined use of BSC; Max A for pericare at bed level then mod A for pt to assist. Pt crying at times stating "I'm so weak". Once stanidng, pt able to take 3 side steps and expressing fear of falling due to "legs will just give out"  Discussed recommendation for post acute rehab with pt and pt states "she just needs to go home"     Vision       Perception     Praxis      Cognition Arousal/Alertness: Awake/alert Behavior During Therapy: Anxious Overall Cognitive Status: History of cognitive impairments - at baseline(per daughter) Area of Impairment: Memory                     Memory: Decreased short-term memory         General Comments: Continued to repeat "please help my feet" during LE ther ex.        Exercises General Exercises - Lower Extremity Ankle Circles/Pumps: AROM;Both;5 reps;Supine Heel Slides: AROM;Both;5 reps;Supine Hip ABduction/ADduction: AROM;Both;5 reps;Supine Straight Leg Raises: AAROM;Both;5 reps;Supine   Shoulder Instructions  General Comments      Pertinent Vitals/ Pain       Pain Assessment: Faces Faces Pain Scale: Hurts even more Pain Location: BIL LEs and feet. Pt reports neuropathy in BIL feet causing pain. Noted possible clonus on R LE. Pain Descriptors / Indicators: Aching;Discomfort;Tingling;Pins and needles Pain Intervention(s): Limited activity within patient's tolerance  Home Living                                          Prior Functioning/Environment              Frequency  Min 2X/week        Progress Toward Goals  OT Goals(current goals can now be found in the care plan section)  Progress  towards OT goals: Not progressing toward goals - comment;OT to reassess next treatment(decline in medical status)  Acute Rehab OT Goals Patient Stated Goal: return home to her pet rabbit OT Goal Formulation: With patient Time For Goal Achievement: 06/26/17 Potential to Achieve Goals: Good ADL Goals Pt Will Perform Lower Body Bathing: with modified independence;sit to/from stand Pt Will Transfer to Toilet: with modified independence;bedside commode;ambulating Pt Will Perform Toileting - Clothing Manipulation and hygiene: with modified independence;sit to/from stand;sitting/lateral leans Additional ADL Goal #1: Pt independently verbalize 3 stratyegies to reduce risk of falls  Plan Discharge plan needs to be updated;Frequency needs to be updated    Co-evaluation                 AM-PAC PT "6 Clicks" Daily Activity     Outcome Measure   Help from another person eating meals?: None Help from another person taking care of personal grooming?: A Little Help from another person toileting, which includes using toliet, bedpan, or urinal?: A Lot Help from another person bathing (including washing, rinsing, drying)?: A Lot Help from another person to put on and taking off regular upper body clothing?: A Lot Help from another person to put on and taking off regular lower body clothing?: A Lot 6 Click Score: 15    End of Session    OT Visit Diagnosis: Unsteadiness on feet (R26.81);Muscle weakness (generalized) (M62.81);Pain Pain - part of body: Leg;Hip;Arm;Shoulder   Activity Tolerance Patient tolerated treatment well   Patient Left in bed;with call bell/phone within reach;with bed alarm set   Nurse Communication Mobility status;Other (comment)(DC needs)        Time: 1610-9604 OT Time Calculation (min): 28 min  Charges: OT General Charges $OT Visit: 1 Visit OT Treatments $Self Care/Home Management : 23-37 mins  [facility], OT/L   [phone] 06/15/2017   Stacy Smith,Stacy Smith 06/15/2017, 4:38 PM

## 2017-06-15 NOTE — Progress Notes (Signed)
[place] for heparin Indication: chest pain/ACS  Allergies  Allergen Reactions  . Phenytoin Sodium Extended Itching  . Latex Swelling    Patient Measurements: Height: 5' (152.4 cm) Weight: 167 lb 5.3 oz (75.9 kg) IBW/kg (Calculated) : 45.5 Heparin Dosing Weight: 64.4 kg  Vital Signs: Temp: 98.4 F (36.9 C) (01/31 0400) Temp Source: Oral (01/31 0400) BP: 152/62 (01/31 0400) Pulse Rate: 90 (01/31 0400)  Labs: Recent Labs    06/13/17 0518  06/13/17 1156  06/13/17 2316 06/14/17 0448 06/14/17 0618 06/14/17 0806 06/14/17 1115 06/14/17 1956 06/15/17 0600  HGB  --   --  9.9*  --   --   --   --  9.2*  --   --  10.3*  HCT  --   --  32.2*  --   --   --   --  30.0*  --   --  33.3*  PLT  --   --  157  --   --   --   --  129*  --   --  138*  HEPARINUNFRC  --   --   --   --   --   --   --   --   --  <0.10* 0.18*  CREATININE 2.42*  --   --   --   --  2.24*  --   --   --   --  2.23*  TROPONINI 0.10*   < >  --    < > 0.89*  --  1.08*  --  0.98*  --   --    < > = values in this interval not displayed.    Estimated Creatinine Clearance: 21.1 mL/min (A) (by C-G formula based on SCr of 2.23 mg/dL (H)).   Medical History: Past Medical History:  Diagnosis Date  . Anxiety   . Bradycardia   . CHF (congestive heart failure) ([place])   . Chronic kidney disease    CKD Stage 3  . Depression   . Diabetes mellitus without complication ([place])   . Hyperkalemia   . Hypertension   . Lupus   . Peripheral neuropathy   . Rheumatoid arthritis ([place])   . Seizures ([place])   . Shortness of breath dyspnea     Medications:  Scheduled:  . amLODipine  5 mg Oral BID  . aspirin EC  81 mg Oral Daily  . atorvastatin  80 mg Oral q1800  . calcitRIOL  0.5 mcg Oral Once per day on Mon Fri  . carvedilol  9.375 mg Oral BID WC  . cholecalciferol  1,000 Units Oral BID  . clonazePAM  0.5 mg Oral BID  . docusate sodium  200 mg Oral QHS  . ferrous sulfate  325 mg Oral  BID WC  . furosemide  40 mg Oral Once  . hydrALAZINE  75 mg Oral Q8H  . insulin aspart  0-15 Units Subcutaneous TID WC  . insulin aspart  3 Units Subcutaneous TID WC  . insulin detemir  20 Units Subcutaneous BID  . isosorbide mononitrate  30 mg Oral Daily  . potassium chloride  40 mEq Oral Once  . sertraline  100 mg Oral Daily  . sodium chloride flush  3 mL Intravenous Q12H  . sodium chloride flush  3 mL Intravenous Q12H  . traZODone  50 mg Oral QHS  . trimethoprim-polymyxin b  2 drop Both Eyes BID    Assessment: 48 yof found to have  increasing troponin and arrhythmias documented this morning. EKG this morning in NSR with non-specific ST-T changes (similar to prior test). No anticoagulation prior to admission.  Heparin level came back subtherapeutic at 0.18, on 950 units/hr. Hgb is 10.3 - stable, platelets are trending back upward at 138 today. No infusion issues per nursing. No signs/symptoms of bleeding.   Goal of Therapy:  Heparin level 0.3-0.7 units/ml Monitor platelets by anticoagulation protocol: Yes   Plan:  Increase heparin infusion to 1150 units/hr Obtain heparin level in 8 hours Monitor daily HL and CBC Monitor signs/symptoms of bleeding  Doylene Canard, PharmD Clinical Pharmacist  Pager: [phone] Clinical Phone for 06/15/2017 until 3:30pm: [phone] If after 3:30pm, please call main pharmacy at [phone] 06/15/2017,9:21 AM

## 2017-06-15 NOTE — Progress Notes (Signed)
Return to visit with patient to complete AD.  AD done and copies given to nurse for chart and patient chosen agents.  Will follow as needed.   06/15/17 1436  Clinical Encounter Type  Visited With Patient and family together;Health care provider  Visit Type Spiritual support  Referral From Nurse  Spiritual Encounters  Spiritual Needs Brochure;Prayer;Emotional  Stress Factors  Patient Stress Factors None identified  Family Stress Factors None identified  Advance Directives (For Healthcare)  Does Patient Have a Medical Advance Directive? Yes  Type of Paramedic of [place];Living will  Copy of [place] in Chart? Yes  Copy of Living Will in Chart? Yes  Cristopher Peru, [facility], Pager [phone]

## 2017-06-15 NOTE — Progress Notes (Addendum)
Responded to [facility] to support patient.  Patient sleeping.prayed over patient.  Unit secretary will insure that patient get AD form.  Chaplain will be paged when ready and patient's daughter arrives. Chaplain availabe as needed.  Jaclynn Major, [place], [facility], Pager [phone]

## 2017-06-15 NOTE — Progress Notes (Signed)
[place] for heparin Indication: chest pain/ACS  Allergies  Allergen Reactions  . Phenytoin Sodium Extended Itching  . Latex Swelling    Patient Measurements: Height: 5' (152.4 cm) Weight: 167 lb 5.3 oz (75.9 kg) IBW/kg (Calculated) : 45.5 Heparin Dosing Weight: 64.4 kg  Vital Signs: Temp: 99.1 F (37.3 C) (01/31 1035) Temp Source: Oral (01/31 1035) BP: 156/60 (01/31 1035) Pulse Rate: 91 (01/31 1035)  Labs: Recent Labs    06/13/17 0518  06/13/17 1156  06/13/17 2316 06/14/17 0448 06/14/17 0618 06/14/17 0806 06/14/17 1115 06/14/17 1956 06/15/17 0600 06/15/17 1647  HGB  --    < > 9.9*  --   --   --   --  9.2*  --   --  10.3*  --   HCT  --   --  32.2*  --   --   --   --  30.0*  --   --  33.3*  --   PLT  --   --  157  --   --   --   --  129*  --   --  138*  --   HEPARINUNFRC  --   --   --   --   --   --   --   --   --  <0.10* 0.18* 0.23*  CREATININE 2.42*  --   --   --   --  2.24*  --   --   --   --  2.23*  --   TROPONINI 0.10*   < >  --    < > 0.89*  --  1.08*  --  0.98*  --   --   --    < > = values in this interval not displayed.    Estimated Creatinine Clearance: 21.1 mL/min (A) (by C-G formula based on SCr of 2.23 mg/dL (H)).   Medical History: Past Medical History:  Diagnosis Date  . Anxiety   . Bradycardia   . CHF (congestive heart failure) ([place])   . Chronic kidney disease    CKD Stage 3  . Depression   . Diabetes mellitus without complication ([place])   . Hyperkalemia   . Hypertension   . Lupus   . Peripheral neuropathy   . Rheumatoid arthritis ([place])   . Seizures ([place])   . Shortness of breath dyspnea     Medications:  Scheduled:  . amLODipine  5 mg Oral BID  . aspirin EC  81 mg Oral Daily  . atorvastatin  80 mg Oral q1800  . calcitRIOL  0.5 mcg Oral Once per day on Mon Fri  . carvedilol  9.375 mg Oral BID WC  . cholecalciferol  1,000 Units Oral BID  . clonazePAM  0.5 mg Oral BID  . docusate sodium  200  mg Oral QHS  . ferrous sulfate  325 mg Oral BID WC  . guaiFENesin  1,200 mg Oral BID  . hydrALAZINE  75 mg Oral Q8H  . insulin aspart  0-15 Units Subcutaneous TID WC  . insulin aspart  3 Units Subcutaneous TID WC  . insulin detemir  20 Units Subcutaneous BID  . isosorbide mononitrate  30 mg Oral Daily  . sertraline  100 mg Oral Daily  . sodium chloride flush  3 mL Intravenous Q12H  . sodium chloride flush  3 mL Intravenous Q12H  . traZODone  50 mg Oral QHS  . trimethoprim-polymyxin b  2 drop Both Eyes  BID    Assessment: 24 yof found to have increasing troponin and arrhythmias documented this morning. EKG this morning in NSR with non-specific ST-T changes (similar to prior test). No anticoagulation prior to admission.  Heparin drip 1150 uts/hr HL 0.23 < goal.   Hgb is 10.3 - stable, platelets are trending back upward at 138 today. No infusion issues per nursing. No signs/symptoms of bleeding.   Goal of Therapy:  Heparin level 0.3-0.7 units/ml Monitor platelets by anticoagulation protocol: Yes   Plan:  Increase heparin infusion to 1300 units/hr Monitor daily HL and CBC Monitor signs/symptoms of bleeding  Bonnita Nasuti Pharm.D. CPP, BCPS Clinical Pharmacist [phone] 06/15/2017 6:05 PM

## 2017-06-15 NOTE — Progress Notes (Addendum)
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     PT Comments    Pt required max encouragement to participate with therapy today. She declined all mobilities but was agreeable to ther ex in bed. Frequent cueing needed to keep pt on task. Pt with c/o pain in BIL LEs through out session. She reports neuropathy in both feet. Noted possible clonus on R LE during ther ex. Educated pt on importance of performing HEP 3x/day. Cardiac cath planned for tomorrow. Pt d/c updated to SNF as pt currently unsafe to return home. Plan discussed with Stacy Smith, PT. Will continue to follow acutely to maximize functional independence and safety with mobility.    Follow Up Recommendations  SNF     Equipment Recommendations  None recommended by PT    Recommendations for Other Services       Precautions / Restrictions Precautions Precautions: Fall Restrictions Weight Bearing Restrictions: No    Mobility  Bed Mobility               General bed mobility comments: pt refusing mobilities secondary to pain and nausea.  Transfers                    Ambulation/Gait                 Stairs            Wheelchair Mobility    Modified Rankin (Stroke Patients Only)       Balance Overall balance assessment: Needs assistance   Sitting balance-Leahy Scale: Good     Standing balance support: Bilateral upper extremity supported Standing balance-Leahy Scale: Fair Standing balance comment: reliance on UE support                             Cognition Arousal/Alertness: Awake/alert Behavior During Therapy: Anxious Overall Cognitive Status: History of cognitive impairments - at baseline Area of Impairment: Memory                     Memory: Decreased short-term memory         General Comments: Continued to repeat "please help my feet" during LE ther ex.      Exercises General Exercises - Lower Extremity Ankle Circles/Pumps: AROM;Both;5 reps;Supine Heel Slides: AROM;Both;5 reps;Supine Hip ABduction/ADduction: AROM;Both;5 reps;Supine Straight Leg Raises: AAROM;Both;5 reps;Supine    General Comments        Pertinent Vitals/Pain Pain Assessment: Faces Faces Pain Scale: Hurts even more Pain Location: BIL LEs and feet. Pt reports neuropathy in BIL feet causing pain. Noted possible clonus on R LE. Pain Descriptors / Indicators: Aching;Discomfort;Tingling;Pins and needles Pain Intervention(s): Monitored during session;Limited activity within patient's tolerance    Home Living                      Prior Function            PT Goals (current goals can now be found in the care plan section) Acute Rehab PT Goals Patient Stated Goal: return home to her pet  rabbit PT Goal Formulation: With patient Time For Goal Achievement: 06/25/17 Potential to Achieve Goals: Fair Progress towards PT goals: Not progressing toward goals - comment(limited by pain and nausea)    Frequency    Min 3X/week      PT Plan Discharge plan needs to be updated    Co-evaluation              AM-PAC PT "6 Clicks" Daily Activity  Outcome Measure  Difficulty turning over in bed (including adjusting bedclothes, sheets and blankets)?: Unable Difficulty moving from lying on back to sitting on the side of the bed? : Unable Difficulty sitting down on and standing up from a chair with arms (e.g., wheelchair, bedside commode, etc,.)?: Unable Help needed moving to and from a bed to chair (including a  wheelchair)?: A Little Help needed walking in hospital room?: A Lot Help needed climbing 3-5 steps with a railing? : A Lot 6 Click Score: 10    End of Session   Activity Tolerance: Patient limited by pain Patient left: with call bell/phone within reach;with family/visitor present;in bed;with bed alarm set Nurse Communication: Mobility status PT Visit Diagnosis: Muscle weakness (generalized) (M62.81)     Time: 8309-4076 PT Time Calculation (min) (ACUTE ONLY): 14 min  Charges:  $Therapeutic Exercise: 8-22 mins                    G Codes:       Stacy Smith, [place] Pager [phone] Acute Rehab   Stacy Smith 06/15/2017, 3:53 PM

## 2017-06-15 NOTE — Progress Notes (Signed)
PROGRESS NOTE    Stacy Smith  LDJ:[id number] DOB: Jun 15, 1945 DOA: 06/10/2017 PCP: Jacqualine Code, DO   Brief Narrative:  Stacy Smith is an 72 y.o. female past medical history significant for pulmonary hypertension, with a right heart cath in January 2018 that showed pressure of 74,, chronic diastolic heart failure ,diabetes mellitus with chronic renal disease stage III and other comorbids who presents to the ED with dyspnea and lower extremity edema. Found to have an Acute Decompensation of Diastolic CHF and Pulmonary HTN. She was found to have an Elevated Troponin and ?CP so Cardiology started her on Heparin gtt. Patient now RSV + and plan is to go to Cath tomorrow afternoon if Cr is improved.   Assessment & Plan:   Principal Problem:   Major depression, recurrent, chronic (HCC) Active Problems:   Essential hypertension   Hyperlipidemia   Acute kidney injury superimposed on chronic kidney disease (HCC)   Diabetes type 2, uncontrolled (HCC)   Hyponatremia   Iron deficiency anemia due to chronic blood loss   Pulmonary HTN (HCC)   Acute diastolic CHF (congestive heart failure) (HCC)  Acute Decompensation of Diastolic CHF (congestive Heart Failure) ([place]): -VQ scan was normal  -Right heart cath showed moderate pulmonary venous hypertension,  -2D echo showed an EF of 65-75% with grade 1 diastolic heart failure with right-sided showed only mildly dilated right ventricle. -Cardiology was consulted recommended IV diuresis. IV diuresis changed to po Lasix 40 mg BID by Cardiology Dr. Aundra Dubin; Received dose of po Lasix this AM and then dose tonight will be held pending possible LHC in AM -Right heart cath on 06/12/2016 showed pulmonary venous hypertension with an elevated filling pressure.. -Strict I's/O's, Daily Weights SLIV -Patient is -6.003 Liters and Weight is down 13 lbs -Cardiology added Carvedilol 6.25 mg po BID yesterday and increased dose to 9.375 -Continue to Monitor Volume  Status closely; Patient appears Euvolemic today   Pulmonary Hypertension -Right heart cath on the 28th showed a mixed picture -ECHO showed mildly dilated RV -Cardiology recommended IV diuresis but now changed to po Lasix 40 mg po BID but dose tonight will be held in aniticipation of possible Coronary Angiography in Afternoon -Per Cardiology, treatment will be diuretics rather than selective pulmonary vasodilators at this point  RSV Bronchitis -Patient's Respiratory Virus Panel was Positive  -Will add Azithromycin for Empiric Treatment x5 Days -Supportive Care with DuoNeb q6hprn -Added Flutter Valve and Incentive Spirometry -Repeat CXR in AM   Nausea and Vomiting -Added po/IV Zofran and Promethazine for Breakthrough Nausea/Vomiting -Discussed with patient about getting an Abdominal Scan and she adamantly refused -Continue to Monitor and   Acute kidney injury on chronic kidney disease stage IV -Baseline creatinine 1.8-1.9 -Creatinine has improved today and went from 2.35 -> 2.42 -> 2.24 -> 2.23 -Nephrology was consulted who agreed with IV diuresis now they have signed off. Cardiology changing IV diuresis to po Lasix yesterday and holding tonight's Lasix dose  -Continue to monitor and repeat CMP in AM   Essential Hypertension -C/w Amlodipine 5 mg po BID, Clonidine 0.2 mg po 4 Times daily PRN SBP>170, Isosorbide Mononitrate 30 mg po Daily, and Hydralazine 75 mg po q8h (increased by Cardiology)  -Cardiology added Carvedilol 6.25 mg po BID but increased it to 9.375 mg po BID  -C/w Furosemide 40 mg po BID but tonight's dose in anticipation for Cardiac Cath in Afternoon   Hyperlipidemia -Lipid Panel showed Cholesterol of 166, HDL of 42, LDL 93, TG of 156,  and VLDL of 31  -C/w Atorvastatin 80 mg po Daily  Normocytic Anemia -Decreased TIBC only, would favor due to chronic renal disease. -Hb/Hct went from 10.3/33.3 -> 9.9/32.2 -> 10.3/33.3 -Continue to Monitor for S/Sx of Bleeding now  that patient is being started on Heparin gtt -C/w Ferrous Sulfate 325 mg po BIDwm -Follow-up with Nephrology as an outpatient.  Major Depressive Disorder: -Psychiatry was consulted recommended Clonazepam 0.5 mg BID -Continue Sertraline 100 mg po Daily.  Rashes: Follow-up with PCP as an outpatient.  Cirrhosis: -U/S Abd RUQ showed Cirrhotic Appearing Liver and no focal lesion; no acute abnormalities and Negative for Gallstones -Platelet Count was 138 today  -Check Ammonia Level in AM -Check PT-INR in AM  Hypokalemia -Patient's K+ was 3.0 and improved to 3.8 -Replete with po KCl 40 meq x3 yesterday -Continue to Monitor and Replete as Necessary -Repeat CMP in AM  Elevated Troponin -Patient/s Troponin peaked at 1.08 -Cardiology starting Heparin gtt -C/w ASA 81 mg po Daily, Atorvastatin 80 mg po Daily, and added Carvedilol 6.25 mg po BID but increased it to 9.375 mg po BID -Cardiology also adding Imdur 30 mg po Daily  -Per Cardiology possible Cath tomorrow Afternoon  Tachyarrhythmias -Cardiology adding BB with Carvedilol 6.25 mg BID and increased it to 9.375 mg BID  -Replete Electrolytes -Repeat EKG in AM   Diabetes Mellitus Type 2 -CBG's ranging from 132-152 -C/w Insulin Detemir 20 units sq BID and Novolog 3 units TIDwm, -C/w Moderate Novolog SSI   DVT prophylaxis: Anticoagulated with Heparin gtt Code Status: FULL CODE Family Communication: Discuss with Brother and Daughter at bedside Disposition Plan: Remain Inpatient for continued workup and treatment: SNF at D/C  Consultants:   Cardiology CHF Team  Psychiatry  Nephrology   Procedures:  ECHOCARDIOGRAM ------------------------------------------------------------------- Study Conclusions  - Left ventricle: The cavity size was normal. Wall thickness was   increased in a pattern of mild LVH. Systolic function was   vigorous. The estimated ejection fraction was in the range of 65%   to 70%. Wall motion was  normal; there were no regional wall   motion abnormalities. Doppler parameters are consistent with   abnormal left ventricular relaxation (grade 1 diastolic   dysfunction). Doppler parameters are consistent with high   ventricular filling pressure. - Aortic valve: There was mild stenosis. - Mitral valve: Calcified annulus. - Left atrium: The atrium was mildly dilated. - Right ventricle: The cavity size was mildly dilated. - Right atrium: The atrium was mildly dilated. - Pulmonary arteries: Systolic pressure was moderately to severely   increased. PA peak pressure: 60 mm Hg (S).  Impressions:  - Vigorous LV systolic function; mild diastolic dysfunction with   elevated LV filling pressure; mild LVH; elevated LVOT gradient   (mean gradient 17 mmHg) at least partially explained by vigorous   LV function; aortic valve not well visualized but appears to open   well; mild biatrial enlargement; mild RVE; mild TR with moderate   to severe pulmonary hypertension.  RIGHT HEART CATHETERIZATION 1. Moderate pulmonary venous hypertension.  2. Elevated R > L heart filling pressures 3. Preserved cardiac output.   Antimicrobials:  Anti-infectives (From admission, onward)   None     Subjective: Seen and examined at bedside and was feeling "extremely sick" and had Nausea but no vomiting. Patient was extremely anxious and felt as if she was going to "die." She refused her CT Abd Pelvis today. No CP or SOB but is coughing. No lightheadedness or dizziness. No other complaints  or concerns at this time.   Objective: Vitals:   06/14/17 1800 06/14/17 1930 06/15/17 0400 06/15/17 1035  BP: (!) 167/66 (!) 157/61 (!) 152/62 (!) 156/60  Pulse:  96 90 91  Resp:  18 18 18   Temp:  100.3 F (37.9 C) 98.4 F (36.9 C) 99.1 F (37.3 C)  TempSrc:  Oral Oral Oral  SpO2:  98% 98% 94%  Weight:   75.9 kg (167 lb 5.3 oz)   Height:        Intake/Output Summary (Last 24 hours) at 06/15/2017 1841 Last data  filed at 06/15/2017 1535 Gross per 24 hour  Intake 674.56 ml  Output 1100 ml  Net -425.44 ml   Filed Weights   06/13/17 0513 06/14/17 0431 06/15/17 0400  Weight: 77.2 kg (170 lb 3.1 oz) 76.3 kg (168 lb 3.2 oz) 75.9 kg (167 lb 5.3 oz)   Examination: Physical Exam:  Constitutional: WN/WD obese Caucasian female who is nauseous and not feeling well  Eyes: Sclerae anicteric. Lids normal.  ENMT: External Ears and nose appear normal. MMM Neck: Supple with no JVD Respiratory: Diminished but no appreciable wheezing, rales, rhonchi. Patient was not tachypenic or using any accessory muscles to breathe Cardiovascular: RRR; S1/S2. No appreciable LE edema Abdomen: Soft, Mildly Tender, Distended due to body habitus. Bowel sounds present GU: Deferred Musculoskeletal: No contractures; No cyanosis Skin: Warm and Dry Neurologic: CN 2-12 grossly intact. No appreciable focal deficits Psychiatric: Anxious appearing mood and depressed. Intact judgement and insight  Data Reviewed: I have personally reviewed following labs and imaging studies  CBC: Recent Labs  Lab 06/12/17 0523 06/12/17 1033 06/13/17 1156 06/14/17 0806 06/15/17 0600  WBC 5.3 7.0 6.7 6.0 7.4  NEUTROABS  --   --   --  4.5 6.1  HGB 9.8* 10.3* 9.9* 9.2* 10.3*  HCT 31.7* 33.3* 32.2* 30.0* 33.3*  MCV 88.3 88.3 88.7 88.2 88.1  PLT 159 175 157 129* 924*   Basic Metabolic Panel: Recent Labs  Lab 06/11/17 0632 06/12/17 0523 06/12/17 1033 06/13/17 0518 06/14/17 0448 06/14/17 0806 06/15/17 0600  NA 136 141  --  141 142  --  137  K 3.7 3.8  --  3.8 3.0*  --  3.8  CL 100* 105  --  104 104  --  102  CO2 22 24  --  20* 25  --  24  GLUCOSE 102* 125*  --  294* 96  --  155*  BUN 23* 23*  --  28* 30*  --  31*  CREATININE 2.32* 2.22* 2.35* 2.42* 2.24*  --  2.23*  CALCIUM 8.8* 8.8*  --  9.1 9.2  --  8.7*  MG  --   --   --   --  2.0  --  2.1  PHOS  --   --   --   --   --  3.9 3.7   GFR: Estimated Creatinine Clearance: 21.1 mL/min  (A) (by C-G formula based on SCr of 2.23 mg/dL (H)). Liver Function Tests: Recent Labs  Lab 06/10/17 1233 06/15/17 0600  AST 25 37  ALT 13* 15  ALKPHOS 79 67  BILITOT 0.7 0.9  PROT 6.8 6.4*  ALBUMIN 2.9* 2.8*   No results for input(s): LIPASE, AMYLASE in the last 168 hours. Recent Labs  Lab 06/10/17 1233  AMMONIA 49*   Coagulation Profile: Recent Labs  Lab 06/12/17 0523  INR 1.18   Cardiac Enzymes: Recent Labs  Lab 06/13/17 1100 06/13/17 1726 06/13/17 2316  06/14/17 0618 06/14/17 1115  TROPONINI 0.11* 0.39* 0.89* 1.08* 0.98*   BNP (last 3 results) No results for input(s): PROBNP in the last 8760 hours. HbA1C: No results for input(s): HGBA1C in the last 72 hours. CBG: Recent Labs  Lab 06/14/17 1645 06/14/17 2111 06/15/17 0729 06/15/17 1131 06/15/17 1659  GLUCAP 143* 160* 134* 152* 132*   Lipid Profile: Recent Labs    06/14/17 0448  CHOL 166  HDL 42  LDLCALC 93  TRIG 156*  CHOLHDL 4.0   Thyroid Function Tests: No results for input(s): TSH, T4TOTAL, FREET4, T3FREE, THYROIDAB in the last 72 hours. Anemia Panel: No results for input(s): VITAMINB12, FOLATE, FERRITIN, TIBC, IRON, RETICCTPCT in the last 72 hours. Sepsis Labs: No results for input(s): PROCALCITON, LATICACIDVEN in the last 168 hours.  Recent Results (from the past 240 hour(s))  Culture, blood (routine x 2)     Status: None (Preliminary result)   Collection Time: 06/13/17 12:05 PM  Result Value Ref Range Status   Specimen Description BLOOD LEFT HAND  Final   Special Requests IN PEDIATRIC BOTTLE Blood Culture adequate volume  Final   Culture NO GROWTH 2 DAYS  Final   Report Status PENDING  Incomplete  Culture, blood (routine x 2)     Status: None (Preliminary result)   Collection Time: 06/13/17 12:10 PM  Result Value Ref Range Status   Specimen Description BLOOD ARM LEFT  Final   Special Requests IN PEDIATRIC BOTTLE Blood Culture adequate volume  Final   Culture NO GROWTH 2 DAYS   Final   Report Status PENDING  Incomplete  Respiratory Panel by PCR     Status: Abnormal   Collection Time: 06/15/17 10:59 AM  Result Value Ref Range Status   Adenovirus NOT DETECTED NOT DETECTED Final   Coronavirus 229E NOT DETECTED NOT DETECTED Final   Coronavirus HKU1 NOT DETECTED NOT DETECTED Final   Coronavirus NL63 NOT DETECTED NOT DETECTED Final   Coronavirus OC43 NOT DETECTED NOT DETECTED Final   Metapneumovirus NOT DETECTED NOT DETECTED Final   Rhinovirus / Enterovirus NOT DETECTED NOT DETECTED Final   Influenza A NOT DETECTED NOT DETECTED Final   Influenza B NOT DETECTED NOT DETECTED Final   Parainfluenza Virus 1 NOT DETECTED NOT DETECTED Final   Parainfluenza Virus 2 NOT DETECTED NOT DETECTED Final   Parainfluenza Virus 3 NOT DETECTED NOT DETECTED Final   Parainfluenza Virus 4 NOT DETECTED NOT DETECTED Final   Respiratory Syncytial Virus DETECTED (A) NOT DETECTED Final    Comment: CRITICAL RESULT CALLED TO, READ BACK BY AND VERIFIED WITH: Clarene Essex RN 15:45 06/15/17 ([email or web address])    Bordetella pertussis NOT DETECTED NOT DETECTED Final   Chlamydophila pneumoniae NOT DETECTED NOT DETECTED Final   Mycoplasma pneumoniae NOT DETECTED NOT DETECTED Final    Radiology Studies: No results found. Scheduled Meds: . amLODipine  5 mg Oral BID  . aspirin EC  81 mg Oral Daily  . atorvastatin  80 mg Oral q1800  . calcitRIOL  0.5 mcg Oral Once per day on Mon Fri  . carvedilol  9.375 mg Oral BID WC  . cholecalciferol  1,000 Units Oral BID  . clonazePAM  0.5 mg Oral BID  . docusate sodium  200 mg Oral QHS  . ferrous sulfate  325 mg Oral BID WC  . guaiFENesin  1,200 mg Oral BID  . hydrALAZINE  75 mg Oral Q8H  . insulin aspart  0-15 Units Subcutaneous TID WC  . insulin aspart  3 Units  Subcutaneous TID WC  . insulin detemir  20 Units Subcutaneous BID  . isosorbide mononitrate  30 mg Oral Daily  . sertraline  100 mg Oral Daily  . sodium chloride flush  3 mL Intravenous Q12H  . sodium  chloride flush  3 mL Intravenous Q12H  . traZODone  50 mg Oral QHS  . trimethoprim-polymyxin b  2 drop Both Eyes BID   Continuous Infusions: . sodium chloride 250 mL (06/14/17 1242)  . heparin 1,150 Units/hr (06/15/17 0944)    LOS: 5 days   Kerney Elbe, DO [facility] Pager [phone]  If 7PM-7AM, please contact night-coverage [email or web address] Password [facility] 06/15/2017, 6:41 PM

## 2017-06-15 NOTE — Progress Notes (Signed)
MD made aware  That patient is RSV positive.

## 2017-06-15 NOTE — Progress Notes (Signed)
Patient ID: Stacy Smith, female   DOB: [birth year], 72 y.o.   MRN: [id number]     Advanced Heart Failure Rounding Note   HF Cardiology: Aundra Dubin   Subjective:    Troponin rose 0.89 => 1.08 => 0.98.  No further chest pain.  On discussion with her, she only had chest pain during SVT/NSVT.    Main complaint today is nausea.  No further vomiting.  Low grade fever to 100.3 last night. UA negative yesterday, CXR without infiltrate.   Creatinine stable at 2.23.   No dyspnea, weight is significantly down. BP remains elevated.   RHC (1/18): mean RA 8, PA 78/25 mean 45, mean PCWP 18, CI 3.6, PVR 4.6 WU Echo (9/18): EF 60-65%, normal RV systolic function, PASP 58, mild TR and MR.  Echo (1/19): EF 65-70%, mild aortic stenosis, mildly dilated RV normal systolic function, PASP 60 mmHg.  V/Q scan (1/19): No PE Abdominal US (1/19): Liver appears cirrhotic. No gallstones  RHC Procedural Findings (06/12/17): Hemodynamics (mmHg) RA mean 13 RV 65/15 PA 61/27, mean 39 PCWP mean 20 Oxygen saturations: PA 80% AO 97% Cardiac Output (Fick) 10.1  Cardiac Index (Fick) 5.87 PVR 1.9 WU Cardiac Output (Thermo) 9.23 Cardiac Output (Thermo) 5.34 PVR 2 WU  Objective:   Weight Range: 167 lb 5.3 oz (75.9 kg) Body mass index is 32.68 kg/m.   Vital Signs:   Temp:  [98.4 F (36.9 C)-100.3 F (37.9 C)] 98.4 F (36.9 C) (01/31 0400) Pulse Rate:  [90-96] 90 (01/31 0400) Resp:  [18] 18 (01/31 0400) BP: (152-167)/(60-66) 152/62 (01/31 0400) SpO2:  [96 %-98 %] 98 % (01/31 0400) Weight:  [167 lb 5.3 oz (75.9 kg)] 167 lb 5.3 oz (75.9 kg) (01/31 0400) Last BM Date: 06/10/17  Weight change: Filed Weights   06/13/17 0513 06/14/17 0431 06/15/17 0400  Weight: 170 lb 3.1 oz (77.2 kg) 168 lb 3.2 oz (76.3 kg) 167 lb 5.3 oz (75.9 kg)    Intake/Output:   Intake/Output Summary (Last 24 hours) at 06/15/2017 0838 Last data filed at 06/15/2017 0500 Gross per 24 hour  Intake 908.69 ml  Output 1700 ml  Net  -791.31 ml      Physical Exam    General: NAD Neck: No JVD, no thyromegaly or thyroid nodule.  Lungs: Clear to auscultation bilaterally with normal respiratory effort. CV: Nondisplaced PMI.  Heart regular S1/S2, no S3/S4, no murmur.  No peripheral edema.  No carotid bruit.  Normal pedal pulses.  Abdomen: Soft, nontender, no hepatosplenomegaly, no distention.  Skin: Intact without lesions or rashes.  Neurologic: Alert and oriented x 3.  Psych: Normal affect. Extremities: No clubbing or cyanosis.  HEENT: Normal.   Telemetry   NSR 80s.  No SVT/NSVT (personally reviewed).   Labs    CBC Recent Labs    06/14/17 0806 06/15/17 0600  WBC 6.0 7.4  NEUTROABS 4.5 6.1  HGB 9.2* 10.3*  HCT 30.0* 33.3*  MCV 88.2 88.1  PLT 129* 762*   Basic Metabolic Panel Recent Labs    06/14/17 0448 06/14/17 0806 06/15/17 0600  NA 142  --  137  K 3.0*  --  3.8  CL 104  --  102  CO2 25  --  24  GLUCOSE 96  --  155*  BUN 30*  --  31*  CREATININE 2.24*  --  2.23*  CALCIUM 9.2  --  8.7*  MG 2.0  --  2.1  PHOS  --  3.9 3.7   Liver Function  Tests Recent Labs    06/15/17 0600  AST 37  ALT 15  ALKPHOS 67  BILITOT 0.9  PROT 6.4*  ALBUMIN 2.8*   No results for input(s): LIPASE, AMYLASE in the last 72 hours. Cardiac Enzymes Recent Labs    06/13/17 2316 06/14/17 0618 06/14/17 1115  TROPONINI 0.89* 1.08* 0.98*    BNP: BNP (last 3 results) Recent Labs    06/10/17 1538 06/11/17 0632  BNP 394.7* 470.6*    ProBNP (last 3 results) No results for input(s): PROBNP in the last 8760 hours.   D-Dimer No results for input(s): DDIMER in the last 72 hours. Hemoglobin A1C No results for input(s): HGBA1C in the last 72 hours. Fasting Lipid Panel Recent Labs    06/14/17 0448  CHOL 166  HDL 42  LDLCALC 93  TRIG 156*  CHOLHDL 4.0   Thyroid Function Tests No results for input(s): TSH, T4TOTAL, T3FREE, THYROIDAB in the last 72 hours.  Invalid input(s): FREET3  Other  results:   Imaging    No results found.   Medications:     Scheduled Medications: . amLODipine  5 mg Oral BID  . aspirin EC  81 mg Oral Daily  . atorvastatin  80 mg Oral q1800  . calcitRIOL  0.5 mcg Oral Once per day on Mon Fri  . carvedilol  9.375 mg Oral BID WC  . cholecalciferol  1,000 Units Oral BID  . clonazePAM  0.5 mg Oral BID  . docusate sodium  200 mg Oral QHS  . ferrous sulfate  325 mg Oral BID WC  . furosemide  40 mg Oral Daily  . hydrALAZINE  75 mg Oral Q8H  . insulin aspart  0-15 Units Subcutaneous TID WC  . insulin aspart  3 Units Subcutaneous TID WC  . insulin detemir  20 Units Subcutaneous BID  . isosorbide mononitrate  30 mg Oral Daily  . sertraline  100 mg Oral Daily  . sodium chloride flush  3 mL Intravenous Q12H  . sodium chloride flush  3 mL Intravenous Q12H  . traZODone  50 mg Oral QHS  . trimethoprim-polymyxin b  2 drop Both Eyes BID    Infusions: . sodium chloride 250 mL (06/14/17 1242)  . heparin 950 Units/hr (06/15/17 0629)    PRN Medications: sodium chloride, acetaminophen **OR** acetaminophen, acetaminophen, cloNIDine, ipratropium-albuterol, ondansetron (ZOFRAN) IV, oxyCODONE, sodium chloride flush    Patient Profile   72 yo with history of CKD stage IV, diastolic CHF, HTN, ?cirrhosis, pulmonary hypertension was admitted with acute on chronic diastolic CHF.   Assessment/Plan   1. Acute on chronic diastolic CHF: Echo 5/62 with EF 65-70%, mildly dilated RV.  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 [facility] 06/12/17, however, showed pulmonary venous hypertension and elevated filling pressures with PVR about 2 WU.  She now looks euvolemic today me, weight down.  - I will give her a dose of po Lasix this morning then hold further Lasix pending possible coronary angiography tomorrow.  - She may benefit from Cardiomems as outpatient => will workup for this.  2.  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  Echo with mildly dilated RV.  V/Q scan with no evidence for chronic PE. RHC 1/19 (this admission), however, showed primarily pulmonary venous hypertension with PVR only 2 WU. - Treatment at this point will be diuretics rather than selective pulmonary vasodilators.   No  change.  3. CKD: Stage 3-4.  Creatinine 2.2 today, now on po diuretics. Followed by nephrology in [place].  4. HTN: BP better but still high.    - Increase Coreg to 9.375 mg bid today.   - Continue hydralazine, amlodipine.   5. Cirrhosis: RUQ [place] suggestive of cirrhosis.  6. Abd pain: RUQ [place] did not show evidence of cholecystitis. She continues to have nausea but no vomiting or diarrhea.  - Continue as needed Zofran.  7. ID: Fever 1/29, low grade fever to 100.3 last night.  CXR unremarkable, UA negative.  Cultures so far negative. WBCs normal.  Not on antibiotics.  8. Elevated troponin: Had episode of chest pain early am 1/29 in setting of nausea/vomiting.  Had chest pain again 1/30 in setting of SVT/NSVT.  ?ACS/NSTEMI versus demand ischemia with volume overload/HTN/tachycardia in setting of elevated creatinine. Troponin trended up to 1.08 then down. No chest pain overnight, feels fine this morning.  - ASA 81 - Continue Imdur 30 daily.  - Continue heparin gtt for now.  - Continue atorvastatin 80 mg daily.  - Add Coreg as above.  - Will need to consider coronary angiography => however, she is at higher risk for contrast nephropathy with CKD stage 3-4.  Holding Lasix after po dose this morning, will reassess creatinine in am.  Possible cath tomorrow mid-day or afternoon after some hydration. We discussed possible cath today.  9. SVT/NSVT: Patient appeared to have 2 tachyarrhythmias on 1/30 => there was a narrow complex SVT and there was also another tachyarrhythmia where the axis changed though QRS only mildly widened => possible  NSVT.  Recent echo with normal EF.  - Increase Coreg to 9.375 mg bid.   - Replace K aggressively and Mg.  - As above, will consider coronary angiography if creatinine continues to trend down.   Length of Stay: [place], MD  06/15/2017, 8:38 AM  Advanced Heart Failure Team Pager [phone] (M-F; 7a - 4p)  Please contact [facility] for night-coverage after hours (4p -7a ) and weekends on [email or web address]

## 2017-06-16 ENCOUNTER — Inpatient Hospital Stay (HOSPITAL_COMMUNITY): Payer: Medicare HMO

## 2017-06-16 DIAGNOSIS — R0602 Shortness of breath: Secondary | ICD-10-CM

## 2017-06-16 DIAGNOSIS — J205 Acute bronchitis due to respiratory syncytial virus: Secondary | ICD-10-CM

## 2017-06-16 DIAGNOSIS — G9340 Encephalopathy, unspecified: Secondary | ICD-10-CM

## 2017-06-16 LAB — CBC WITH DIFFERENTIAL/PLATELET
BASOS ABS: 0 10*3/uL (ref 0.0–0.1)
Basophils Relative: 0 %
EOS PCT: 3 %
Eosinophils Absolute: 0.2 10*3/uL (ref 0.0–0.7)
HCT: 31.8 % — ABNORMAL LOW (ref 36.0–46.0)
Hemoglobin: 9.7 g/dL — ABNORMAL LOW (ref 12.0–15.0)
LYMPHS PCT: 15 %
Lymphs Abs: 0.8 10*3/uL (ref 0.7–4.0)
MCH: 27.1 pg (ref 26.0–34.0)
MCHC: 30.5 g/dL (ref 30.0–36.0)
MCV: 88.8 fL (ref 78.0–100.0)
MONO ABS: 0.3 10*3/uL (ref 0.1–1.0)
MONOS PCT: 6 %
Neutro Abs: 3.8 10*3/uL (ref 1.7–7.7)
Neutrophils Relative %: 76 %
PLATELETS: 117 10*3/uL — AB (ref 150–400)
RBC: 3.58 MIL/uL — ABNORMAL LOW (ref 3.87–5.11)
RDW: 16.6 % — AB (ref 11.5–15.5)
WBC: 5.1 10*3/uL (ref 4.0–10.5)

## 2017-06-16 LAB — COMPREHENSIVE METABOLIC PANEL
ALT: 16 U/L (ref 14–54)
AST: 40 U/L (ref 15–41)
Albumin: 2.8 g/dL — ABNORMAL LOW (ref 3.5–5.0)
Alkaline Phosphatase: 72 U/L (ref 38–126)
Anion gap: 13 (ref 5–15)
BUN: 32 mg/dL — ABNORMAL HIGH (ref 6–20)
CHLORIDE: 103 mmol/L (ref 101–111)
CO2: 23 mmol/L (ref 22–32)
Calcium: 8.8 mg/dL — ABNORMAL LOW (ref 8.9–10.3)
Creatinine, Ser: 2.34 mg/dL — ABNORMAL HIGH (ref 0.44–1.00)
GFR, EST AFRICAN AMERICAN: 23 mL/min — AB (ref >60)
GFR, EST NON AFRICAN AMERICAN: 20 mL/min — AB (ref >60)
Glucose, Bld: 251 mg/dL — ABNORMAL HIGH (ref 65–99)
POTASSIUM: 3.5 mmol/L (ref 3.5–5.1)
Sodium: 139 mmol/L (ref 135–145)
Total Bilirubin: 0.7 mg/dL (ref 0.3–1.2)
Total Protein: 6.2 g/dL — ABNORMAL LOW (ref 6.5–8.1)

## 2017-06-16 LAB — GLUCOSE, CAPILLARY
GLUCOSE-CAPILLARY: 227 mg/dL — AB (ref 65–99)
Glucose-Capillary: 272 mg/dL — ABNORMAL HIGH (ref 65–99)
Glucose-Capillary: 338 mg/dL — ABNORMAL HIGH (ref 65–99)
Glucose-Capillary: 254 mg/dL — ABNORMAL HIGH (ref 65–99)

## 2017-06-16 LAB — PROTIME-INR
INR: 1.22
Prothrombin Time: 15.3 seconds — ABNORMAL HIGH (ref 11.4–15.2)

## 2017-06-16 LAB — HEPARIN LEVEL (UNFRACTIONATED): Heparin Unfractionated: 0.29 IU/mL — ABNORMAL LOW (ref 0.30–0.70)

## 2017-06-16 LAB — MAGNESIUM: MAGNESIUM: 2.2 mg/dL (ref 1.7–2.4)

## 2017-06-16 LAB — PHOSPHORUS: PHOSPHORUS: 3.9 mg/dL (ref 2.5–4.6)

## 2017-06-16 LAB — AMMONIA: AMMONIA: 46 umol/L — AB (ref 9–35)

## 2017-06-16 MED ORDER — CARVEDILOL 12.5 MG PO TABS
12.5000 mg | ORAL_TABLET | Freq: Two times a day (BID) | ORAL | Status: DC
  Administered 2017-06-16 – 2017-06-18 (×4): 12.5 mg via ORAL
  Filled 2017-06-16 (×4): qty 1

## 2017-06-16 MED ORDER — FUROSEMIDE 40 MG PO TABS
40.0000 mg | ORAL_TABLET | Freq: Every day | ORAL | Status: DC
  Administered 2017-06-17 – 2017-06-19 (×3): 40 mg via ORAL
  Filled 2017-06-16 (×3): qty 1

## 2017-06-16 MED ORDER — HEPARIN (PORCINE) IN NACL 100-0.45 UNIT/ML-% IJ SOLN: 1400.0000 [IU]/h | INTRAMUSCULAR | Status: DC

## 2017-06-16 MED ORDER — ENSURE ENLIVE PO LIQD
237.0000 mL | Freq: Three times a day (TID) | ORAL | Status: DC
  Administered 2017-06-16 – 2017-06-17 (×4): 237 mL via ORAL

## 2017-06-16 MED ORDER — HEPARIN SODIUM (PORCINE) 5000 UNIT/ML IJ SOLN
5000.0000 [IU] | Freq: Three times a day (TID) | INTRAMUSCULAR | Status: DC
  Administered 2017-06-16 – 2017-06-19 (×9): 5000 [IU] via SUBCUTANEOUS
  Filled 2017-06-16 (×10): qty 1

## 2017-06-16 NOTE — Progress Notes (Signed)
ANTICOAGULATION CONSULT NOTE - Follow Up Consult  Pharmacy Consult for heparin Indication: chest pain/ACS  Allergies  Allergen Reactions  . Phenytoin Sodium Extended Itching  . Latex Swelling    Patient Measurements: Height: 5' (152.4 cm) Weight: 163 lb 9.3 oz (74.2 kg) IBW/kg (Calculated) : 45.5 Heparin Dosing Weight: 64.4 kg  Vital Signs: Temp: 98.7 F (37.1 C) (02/01 0723) Temp Source: Oral (02/01 0723) BP: 151/57 (02/01 0723) Pulse Rate: 83 (02/01 0723)  Labs: Recent Labs    06/13/17 2316 06/14/17 0448 06/14/17 0618 06/14/17 0806 06/14/17 1115  06/15/17 0600 06/15/17 1647 06/16/17 0536  HGB  --   --   --  9.2*  --   --  10.3*  --  9.7*  HCT  --   --   --  30.0*  --   --  33.3*  --  31.8*  PLT  --   --   --  129*  --   --  138*  --  117*  LABPROT  --   --   --   --   --   --   --   --  15.3*  INR  --   --   --   --   --   --   --   --  1.22  HEPARINUNFRC  --   --   --   --   --    < > 0.18* 0.23* 0.29*  CREATININE  --  2.24*  --   --   --   --  2.23*  --  2.34*  TROPONINI 0.89*  --  1.08*  --  0.98*  --   --   --   --    < > = values in this interval not displayed.    Estimated Creatinine Clearance: 19.8 mL/min (A) (by C-G formula based on SCr of 2.34 mg/dL (H)).  Assessment: 71yof continues on heparin for elevated troponins. Heparin level is just slightly below goal at 0.29. Hgb low but stable, platelets trending back down 135 > 117. No bleeding reported. Plan for cath once Cr improves.   Goal of Therapy:  Heparin level 0.3-0.7 units/ml Monitor platelets by anticoagulation protocol: Yes   Plan:  1) Increase heparin to 1400 units/hr 2) Daily heparin level and CBC - watch platelets closely   Nena Jordan, PharmD, BCPS 06/16/2017,11:40 AM

## 2017-06-16 NOTE — Progress Notes (Signed)
PROGRESS NOTE    Stacy Smith  BMW:[id number] DOB: [birth year] DOA: 06/10/2017 PCP: Jacqualine Code, DO   Brief Narrative:  Stacy Smith is an 72 y.o. female past medical history significant for pulmonary hypertension, with a right heart cath in January 2018 that showed pressure of 74,, chronic diastolic heart failure ,diabetes mellitus with chronic renal disease stage III and other comorbids who presents to the ED with dyspnea and lower extremity edema. Found to have an Acute Decompensation of Diastolic CHF and Pulmonary HTN. She was found to have an Elevated Troponin and ?CP so Cardiology started her on Heparin gtt but has not stopped. Patient now RSV + and plan was to go to Cath this afternoon if Cr is improve, however did not and so Cardiology did not Cath the patient.   Assessment & Plan:   Principal Problem:   Major depression, recurrent, chronic (HCC) Active Problems:   Essential hypertension   Hyperlipidemia   Acute kidney injury superimposed on chronic kidney disease (HCC)   Diabetes type 2, uncontrolled (HCC)   Hyponatremia   Iron deficiency anemia due to chronic blood loss   Pulmonary HTN (HCC)   Acute diastolic CHF (congestive heart failure) (HCC)  Acute Decompensation of Diastolic CHF (congestive Heart Failure) (HCC) -VQ scan was normal  -Right heart cath showed moderate pulmonary venous hypertension,  -2D echo showed an EF of 65-75% with grade 1 diastolic heart failure with right-sided showed only mildly dilated right ventricle. -Cardiology recommending holding Diuretics today  -Right heart cath on 06/12/2016 showed pulmonary venous hypertension with an elevated filling pressure.. -Strict I's/O's, Daily Weights SLIV -Patient is -5.527 Liters and Weight is down 17 lbs -Cardiology increased Carvedilol to 12.5 mg po BID  -Continue to Monitor Volume Status closely; Patient appears Euvolemic today  -Per Cards resume po 40 mg Lasix -PT/OT Recommending  SNF  Pulmonary Hypertension -Right heart cath on the 28th showed a mixed picture -ECHO showed mildly dilated RV -Cardiology holding diuretics today  -Per Cardiology, treatment will be diuretics rather than selective pulmonary vasodilators at this point -Per Cardiology resume po Lasix with 40 mg of po Lasix in AM   RSV Bronchitis -Patient's Respiratory Virus Panel was Positive  -Will add Azithromycin for Empiric Treatment x5 Days -Supportive Care with DuoNeb q6hprn -Added Flutter Valve and Incentive Spirometry -Repeat CXR showed no evidence of Active Disease   Nausea and Vomiting -Added po/IV Zofran and Promethazine for Breakthrough Nausea/Vomiting -Discussed with patient about getting an Abdominal Scan and she adamantly refused yesterday; If still not improving will scan in AM -Continue to Monitor and C/w Supportive Care   Acute kidney injury on chronic kidney disease stage IV -Baseline creatinine 1.8-1.9 -Creatinine has improved today and went from 2.35 -> 2.42 -> 2.24 -> 2.23 -> 2.34 -Nephrology was consulted who agreed with IV diuresis now they have signed off.  -Cardiology changed IV diuresis to po Lasix but recommending holding Diuretics today.  -Continue to monitor and repeat CMP in AM   Essential Hypertension -C/w Amlodipine 5 mg po BID, Clonidine 0.2 mg po 4 Times daily PRN SBP>170, Isosorbide Mononitrate 30 mg po Daily, and Hydralazine 75 mg po q8h (increased by Cardiology)  -Cardiology increased Coreg to 12.5 mg po BID -C/w Furosemide 40 mg po BID but tonight's dose in anticipation for Cardiac Cath in Afternoon   Hyperlipidemia -Lipid Panel showed Cholesterol of 166, HDL of 42, LDL 93, TG of 156, and VLDL of 31  -C/w Atorvastatin 80 mg po  Daily  Normocytic Anemia -Decreased TIBC only, would favor due to chronic renal disease. -Hb/Hct went from 10.3/33.3 -> 9.9/32.2 -> 10.3/33.3 -> 9.7/31.8 -Continue to Monitor for S/Sx of Bleeding; No longer on Heparin gtt -C/w  Ferrous Sulfate 325 mg po BIDwm -Follow-up with Nephrology as an outpatient.  Major Depressive Disorder: -Psychiatry was consulted recommended Clonazepam 0.5 mg BID -Continue Sertraline 100 mg po Daily. -Psych Re-consulted as appears more withdrawn   Rashes: Follow-up with PCP as an outpatient.  Cirrhosis: -U/S Abd RUQ showed Cirrhotic Appearing Liver and no focal lesion; no acute abnormalities and Negative for Gallstones -Platelet Count was 117 today  -Check Ammonia Level and was 46 -Will consider Lactulose if trending up  -Check PT-INR; INR was 1.22  Hypokalemia -Patient's K+ was 3.0 and improved to 3.8 -Replete with po KCl 40 meq x3 yesterday -Continue to Monitor and Replete as Necessary -Repeat CMP in AM  Elevated Troponin -Patient/s Troponin peaked at 1.08 -Cardiology stopped Heparin gtt today  -C/w ASA 81 mg po Daily, Atorvastatin 80 mg po Daily, and added Carvedilol increased to 12.5 mg po BID -Cardiology also adding Imdur 30 mg po Daily  -Per Cardiology Cath Cancelled and holding off Cath given Elevated Cr -Possible Cath when Cr improves  Tachyarrhythmias -Cardiology adding BB with Carvedilol and increased it to 12.5 mg BID   -Replete Electrolytes -Repeat EKG in AM   Diabetes Mellitus Type 2 -CBG's ranging from 132-338 -C/w Insulin Detemir 20 units sq BID and Novolog 3 units TIDwm, -C/w Moderate Novolog SSI  -IF remains elevated will adjust as necessary   DVT prophylaxis: Heparin gtt stopped and now on Sq Heparin  Code Status: FULL CODE Family Communication: Discuss with Brother and Daughter at bedside Disposition Plan: Remain Inpatient for continued workup and treatment: SNF at D/C  Consultants:   Cardiology CHF Team  Psychiatry  Nephrology   Procedures:  ECHOCARDIOGRAM ------------------------------------------------------------------- Study Conclusions  - Left ventricle: The cavity size was normal. Wall thickness was   increased in a  pattern of mild LVH. Systolic function was   vigorous. The estimated ejection fraction was in the range of 65%   to 70%. Wall motion was normal; there were no regional wall   motion abnormalities. Doppler parameters are consistent with   abnormal left ventricular relaxation (grade 1 diastolic   dysfunction). Doppler parameters are consistent with high   ventricular filling pressure. - Aortic valve: There was mild stenosis. - Mitral valve: Calcified annulus. - Left atrium: The atrium was mildly dilated. - Right ventricle: The cavity size was mildly dilated. - Right atrium: The atrium was mildly dilated. - Pulmonary arteries: Systolic pressure was moderately to severely   increased. PA peak pressure: 60 mm Hg (S).  Impressions:  - Vigorous LV systolic function; mild diastolic dysfunction with   elevated LV filling pressure; mild LVH; elevated LVOT gradient   (mean gradient 17 mmHg) at least partially explained by vigorous   LV function; aortic valve not well visualized but appears to open   well; mild biatrial enlargement; mild RVE; mild TR with moderate   to severe pulmonary hypertension.  RIGHT HEART CATHETERIZATION 1. Moderate pulmonary venous hypertension.  2. Elevated R > L heart filling pressures 3. Preserved cardiac output.   Antimicrobials:  Anti-infectives (From admission, onward)   Start     Dose/Rate Route Frequency Ordered Stop   06/15/17 1900  azithromycin (ZITHROMAX) tablet 500 mg     500 mg Oral Daily 06/15/17 1853 06/20/17 0959  Subjective: Seen and examined at bedside and still felt nauseous but states nausea medication helps. No CP and complained of mild abdominal pain. States she felt generally "unwell." Per nurse has been drinking adequately but not eating much. No other complaints or concerns at this time.   Objective: Vitals:   06/16/17 0723 06/16/17 1148 06/16/17 1533 06/16/17 2039  BP: (!) 151/57 (!) 150/51 (!) 151/58 (!) 151/51  Pulse: 83 83 83  80  Resp: 17  17 16   Temp: 98.7 F (37.1 C) 98.8 F (37.1 C) 98.2 F (36.8 C) 98.5 F (36.9 C)  TempSrc: Oral Oral Oral Oral  SpO2: 96% 96% 96% 96%  Weight:      Height:        Intake/Output Summary (Last 24 hours) at 06/16/2017 2055 Last data filed at 06/16/2017 1400 Gross per 24 hour  Intake 976 ml  Output 500 ml  Net 476 ml   Filed Weights   06/14/17 0431 06/15/17 0400 06/16/17 0606  Weight: 76.3 kg (168 lb 3.2 oz) 75.9 kg (167 lb 5.3 oz) 74.2 kg (163 lb 9.3 oz)   Examination: Physical Exam:  Constitutional: WN/WD Obese Caucasian female who remains nauseous but in NAD Eyes: Sclerae anicteric. Lids normal ENMT: External Ears and nose appear normal. MMM Neck: Supple with no JVD Respiratory: Diminished but unlabored breathing; No appreciable wheezing/rales/rhonchi Cardiovascular: RRR; S1/2; Trace LE Edema Abdomen: Soft, Mildly tender to palpate, Distended due to body habitus. Bowel sounds present GU: Deferred Musculoskeletal: No contractures; No cyanosis Skin: Warm and Dry;  Neurologic: CN 2-12 grossly intact. No appreciable focal deficits Psychiatric: Withdrawn mood, anxious and depressed. Awake and alert  Data Reviewed: I have personally reviewed following labs and imaging studies  CBC: Recent Labs  Lab 06/12/17 1033 06/13/17 1156 06/14/17 0806 06/15/17 0600 06/16/17 0536  WBC 7.0 6.7 6.0 7.4 5.1  NEUTROABS  --   --  4.5 6.1 3.8  HGB 10.3* 9.9* 9.2* 10.3* 9.7*  HCT 33.3* 32.2* 30.0* 33.3* 31.8*  MCV 88.3 88.7 88.2 88.1 88.8  PLT 175 157 129* 138* 284*   Basic Metabolic Panel: Recent Labs  Lab 06/12/17 0523 06/12/17 1033 06/13/17 0518 06/14/17 0448 06/14/17 0806 06/15/17 0600 06/16/17 0536  NA 141  --  141 142  --  137 139  K 3.8  --  3.8 3.0*  --  3.8 3.5  CL 105  --  104 104  --  102 103  CO2 24  --  20* 25  --  24 23  GLUCOSE 125*  --  294* 96  --  155* 251*  BUN 23*  --  28* 30*  --  31* 32*  CREATININE 2.22* 2.35* 2.42* 2.24*  --  2.23* 2.34*   CALCIUM 8.8*  --  9.1 9.2  --  8.7* 8.8*  MG  --   --   --  2.0  --  2.1 2.2  PHOS  --   --   --   --  3.9 3.7 3.9   GFR: Estimated Creatinine Clearance: 19.8 mL/min (A) (by C-G formula based on SCr of 2.34 mg/dL (H)). Liver Function Tests: Recent Labs  Lab 06/10/17 1233 06/15/17 0600 06/16/17 0536  AST 25 37 40  ALT 13* 15 16  ALKPHOS 79 67 72  BILITOT 0.7 0.9 0.7  PROT 6.8 6.4* 6.2*  ALBUMIN 2.9* 2.8* 2.8*   No results for input(s): LIPASE, AMYLASE in the last 168 hours. Recent Labs  Lab 06/10/17 1233 06/16/17 0536  AMMONIA 49* 46*   Coagulation Profile: Recent Labs  Lab 06/12/17 0523 06/16/17 0536  INR 1.18 1.22   Cardiac Enzymes: Recent Labs  Lab 06/13/17 1100 06/13/17 1726 06/13/17 2316 06/14/17 0618 06/14/17 1115  TROPONINI 0.11* 0.39* 0.89* 1.08* 0.98*   BNP (last 3 results) No results for input(s): PROBNP in the last 8760 hours. HbA1C: No results for input(s): HGBA1C in the last 72 hours. CBG: Recent Labs  Lab 06/15/17 1659 06/15/17 2112 06/16/17 0731 06/16/17 1128 06/16/17 1657  GLUCAP 132* 183* 227* 272* 338*   Lipid Profile: Recent Labs    06/14/17 0448  CHOL 166  HDL 42  LDLCALC 93  TRIG 156*  CHOLHDL 4.0   Thyroid Function Tests: No results for input(s): TSH, T4TOTAL, FREET4, T3FREE, THYROIDAB in the last 72 hours. Anemia Panel: No results for input(s): VITAMINB12, FOLATE, FERRITIN, TIBC, IRON, RETICCTPCT in the last 72 hours. Sepsis Labs: No results for input(s): PROCALCITON, LATICACIDVEN in the last 168 hours.  Recent Results (from the past 240 hour(s))  Culture, blood (routine x 2)     Status: None (Preliminary result)   Collection Time: 06/13/17 12:05 PM  Result Value Ref Range Status   Specimen Description BLOOD LEFT HAND  Final   Special Requests IN PEDIATRIC BOTTLE Blood Culture adequate volume  Final   Culture   Final    NO GROWTH 3 DAYS Performed at [facility], [address], [place], [place]  [address]    Report Status PENDING  Incomplete  Culture, blood (routine x 2)     Status: None (Preliminary result)   Collection Time: 06/13/17 12:10 PM  Result Value Ref Range Status   Specimen Description BLOOD ARM LEFT  Final   Special Requests IN PEDIATRIC BOTTLE Blood Culture adequate volume  Final   Culture   Final    NO GROWTH 3 DAYS Performed at [facility], [place] [address], [place], [place] [address]    Report Status PENDING  Incomplete  Respiratory Panel by PCR     Status: Abnormal   Collection Time: 06/15/17 10:59 AM  Result Value Ref Range Status   Adenovirus NOT DETECTED NOT DETECTED Final   Coronavirus 229E NOT DETECTED NOT DETECTED Final   Coronavirus HKU1 NOT DETECTED NOT DETECTED Final   Coronavirus NL63 NOT DETECTED NOT DETECTED Final   Coronavirus OC43 NOT DETECTED NOT DETECTED Final   Metapneumovirus NOT DETECTED NOT DETECTED Final   Rhinovirus / Enterovirus NOT DETECTED NOT DETECTED Final   Influenza A NOT DETECTED NOT DETECTED Final   Influenza B NOT DETECTED NOT DETECTED Final   Parainfluenza Virus 1 NOT DETECTED NOT DETECTED Final   Parainfluenza Virus 2 NOT DETECTED NOT DETECTED Final   Parainfluenza Virus 3 NOT DETECTED NOT DETECTED Final   Parainfluenza Virus 4 NOT DETECTED NOT DETECTED Final   Respiratory Syncytial Virus DETECTED (A) NOT DETECTED Final    Comment: CRITICAL RESULT CALLED TO, READ BACK BY AND VERIFIED WITH: Clarene Essex RN 15:45 06/15/17 ([email or web address])    Bordetella pertussis NOT DETECTED NOT DETECTED Final   Chlamydophila pneumoniae NOT DETECTED NOT DETECTED Final   Mycoplasma pneumoniae NOT DETECTED NOT DETECTED Final    Radiology Studies: Dg Chest Port 1 View  Result Date: 06/16/2017 CLINICAL DATA:  Shortness of breath EXAM: PORTABLE CHEST 1 VIEW COMPARISON:  Two days ago FINDINGS: Stable borderline cardiomegaly. Stable mediastinal contours. There is no edema, consolidation, effusion, or pneumothorax. IMPRESSION: No evidence of active  disease. Electronically Signed  By: Monte Fantasia M.D.   On: 06/16/2017 08:13   Scheduled Meds: . amLODipine  5 mg Oral BID  . aspirin EC  81 mg Oral Daily  . atorvastatin  80 mg Oral q1800  . azithromycin  500 mg Oral Daily  . calcitRIOL  0.5 mcg Oral Once per day on Mon Fri  . carvedilol  12.5 mg Oral BID WC  . cholecalciferol  1,000 Units Oral BID  . clonazePAM  0.5 mg Oral BID  . docusate sodium  200 mg Oral QHS  . feeding supplement (ENSURE ENLIVE)  237 mL Oral TID BM  . ferrous sulfate  325 mg Oral BID WC  . [START ON 06/17/2017] furosemide  40 mg Oral Daily  . guaiFENesin  1,200 mg Oral BID  . heparin injection (subcutaneous)  5,000 Units Subcutaneous Q8H  . hydrALAZINE  75 mg Oral Q8H  . insulin aspart  0-15 Units Subcutaneous TID WC  . insulin aspart  3 Units Subcutaneous TID WC  . insulin detemir  20 Units Subcutaneous BID  . isosorbide mononitrate  30 mg Oral Daily  . sertraline  100 mg Oral Daily  . sodium chloride flush  3 mL Intravenous Q12H  . sodium chloride flush  3 mL Intravenous Q12H  . traZODone  50 mg Oral QHS  . trimethoprim-polymyxin b  2 drop Both Eyes BID   Continuous Infusions: . sodium chloride 250 mL (06/14/17 1242)    LOS: 6 days   Kerney Elbe, DO [facility] Pager [phone]  If 7PM-7AM, please contact night-coverage [email or web address] Password [facility] 06/16/2017, 8:55 PM

## 2017-06-16 NOTE — Progress Notes (Signed)
Initial Nutrition Assessment  DOCUMENTATION CODES:   Obesity unspecified  INTERVENTION:   -Ensure Enlive po TID, each supplement provides 350 kcal and 20 grams of protein  NUTRITION DIAGNOSIS:   Inadequate oral intake related to poor appetite as evidenced by meal completion < 50%.  GOAL:   Patient will meet greater than or equal to 90% of their needs  MONITOR:   PO intake, Supplement acceptance, Labs, Weight trends, Skin, I & O's  REASON FOR ASSESSMENT:   Rounds    ASSESSMENT:   Stacy Smith is an 72 y.o. female past medical history significant for pulmonary hypertension, with a right heart cath in January 2018 that showed pressure of 74,, chronic diastolic heart failure ,diabetes mellitus with chronic renal disease stage III and other comorbids who presents to the ED with dyspnea and lower extremity edema.   Pt admitted with major depressive disorder.   RD evaluated pt per request of RN. Per RN, pt has been here several days and is not eating. Pt eating very minimal food off meal trays and is very selective about which foods she will eat. RN offered pt supplements, which she accepts well. However, RN concerned about high CBGS. Noted meal completion 25-80%.   Reviewed wt hx; noted pt with wt fluctuations, likely due to CHF.   Pt unavailable at times of both visits, due to receiving nursing care.    Pt with poor oral intake and would benefit from nutrient dense supplement. One Ensure Enlive supplement provides 350 kcals, 20 grams protein, and 44-45 grams of carbohydrate vs one Glucerna shake supplement, which provides 220 kcals, 10 grams of protein, and 26 grams of carbohydrate. Given pt's hx of DM, RD will continue to monitor PO intake, CBGS, and adjust supplement regimen as appropriate.   Labs reviewed: CBGS: 924-268 (inpatient orders for glycmeic control are 0-15 units insulin aspart TID with meals, 3 units insulin aspart TID with meals, and 20 units insulin detemir  BID).   Diet Order:  Diet Heart Room service appropriate? Yes; Fluid consistency: Thin  EDUCATION NEEDS:   Not appropriate for education at this time  Skin:  Skin Assessment: Reviewed RN Assessment  Last BM:  06/15/17  Height:   Ht Readings from Last 1 Encounters:  06/10/17 5' (1.524 m)    Weight:   Wt Readings from Last 1 Encounters:  06/16/17 163 lb 9.3 oz (74.2 kg)    Ideal Body Weight:  45.5 kg  BMI:  Body mass index is 31.95 kg/m.  Estimated Nutritional Needs:   Kcal:  1400-1600  Protein:  75-90 grams  Fluid:  1.4-1.6 L    Stacy Smith A. Jimmye Norman, RD, LDN, CDE Pager: [phone] After hours Pager: [phone]

## 2017-06-16 NOTE — Progress Notes (Addendum)
Patient ID: Stacy Smith, female   DOB: 1945/08/20, 72 y.o.   MRN: [id number]     Advanced Heart Failure Rounding Note   HF Cardiology: Aundra Dubin   Subjective:    Troponin rose 0.89 => 1.08 => 0.98.  NO CP .   Yesterday diuresed with IV lasix. Weight down another 4 pounds. Creatinine 2.2>2.3.  Coreg was increased.   Denies SOB. No CP.    RHC (1/18): mean RA 8, PA 78/25 mean 45, mean PCWP 18, CI 3.6, PVR 4.6 WU Echo (9/18): EF 60-65%, normal RV systolic function, PASP 58, mild TR and MR.  Echo (1/19): EF 65-70%, mild aortic stenosis, mildly dilated RV normal systolic function, PASP 60 mmHg.  V/Q scan (1/19): No PE Abdominal US (1/19): Liver appears cirrhotic. No gallstones  RHC Procedural Findings (06/12/17): Hemodynamics (mmHg) RA mean 13 RV 65/15 PA 61/27, mean 39 PCWP mean 20 Oxygen saturations: PA 80% AO 97% Cardiac Output (Fick) 10.1  Cardiac Index (Fick) 5.87 PVR 1.9 WU Cardiac Output (Thermo) 9.23 Cardiac Output (Thermo) 5.34 PVR 2 WU  Objective:   Weight Range: 163 lb 9.3 oz (74.2 kg) Body mass index is 31.95 kg/m.   Vital Signs:   Temp:  [98 F (36.7 C)-99.3 F (37.4 C)] 98.7 F (37.1 C) (02/01 0723) Pulse Rate:  [83-94] 83 (02/01 0723) Resp:  [17-18] 17 (02/01 0723) BP: (151-164)/(57-63) 151/57 (02/01 0723) SpO2:  [94 %-98 %] 96 % (02/01 0723) Weight:  [163 lb 9.3 oz (74.2 kg)] 163 lb 9.3 oz (74.2 kg) (02/01 0606) Last BM Date: 06/15/17  Weight change: Filed Weights   06/14/17 0431 06/15/17 0400 06/16/17 0606  Weight: 168 lb 3.2 oz (76.3 kg) 167 lb 5.3 oz (75.9 kg) 163 lb 9.3 oz (74.2 kg)    Intake/Output:   Intake/Output Summary (Last 24 hours) at 06/16/2017 0734 Last data filed at 06/16/2017 4782 Gross per 24 hour  Intake 850 ml  Output 500 ml  Net 350 ml      Physical Exam    General:  Pale. Appears chronically ill.  No resp difficulty. In bed.  HEENT: normal Neck: supple. JVP 5-6 . Carotids 2+ bilat; no bruits. No lymphadenopathy or  thryomegaly appreciated. Cor: PMI nondisplaced. Regular rate & rhythm. No rubs, gallops or murmurs. Lungs: clear Abdomen: soft, nontender, nondistended. No hepatosplenomegaly. No bruits or masses. Good bowel sounds. Extremities: no cyanosis, clubbing, rash, edema Neuro: alert & orientedx3, cranial nerves grossly intact. moves all 4 extremities w/o difficulty. Affect pleasant   Telemetry   NSR 80s personally reviewed.   Labs    CBC Recent Labs    06/14/17 0806 06/15/17 0600  WBC 6.0 7.4  NEUTROABS 4.5 6.1  HGB 9.2* 10.3*  HCT 30.0* 33.3*  MCV 88.2 88.1  PLT 129* 956*   Basic Metabolic Panel Recent Labs    06/15/17 0600 06/16/17 0536  NA 137 139  K 3.8 3.5  CL 102 103  CO2 24 23  GLUCOSE 155* 251*  BUN 31* 32*  CREATININE 2.23* 2.34*  CALCIUM 8.7* 8.8*  MG 2.1 2.2  PHOS 3.7 3.9   Liver Function Tests Recent Labs    06/15/17 0600 06/16/17 0536  AST 37 40  ALT 15 16  ALKPHOS 67 72  BILITOT 0.9 0.7  PROT 6.4* 6.2*  ALBUMIN 2.8* 2.8*   No results for input(s): LIPASE, AMYLASE in the last 72 hours. Cardiac Enzymes Recent Labs    06/13/17 2316 06/14/17 0618 06/14/17 1115  TROPONINI 0.89* 1.08*  0.98*    BNP: BNP (last 3 results) Recent Labs    06/10/17 1538 06/11/17 0632  BNP 394.7* 470.6*    ProBNP (last 3 results) No results for input(s): PROBNP in the last 8760 hours.   D-Dimer No results for input(s): DDIMER in the last 72 hours. Hemoglobin A1C No results for input(s): HGBA1C in the last 72 hours. Fasting Lipid Panel Recent Labs    06/14/17 0448  CHOL 166  HDL 42  LDLCALC 93  TRIG 156*  CHOLHDL 4.0   Thyroid Function Tests No results for input(s): TSH, T4TOTAL, T3FREE, THYROIDAB in the last 72 hours.  Invalid input(s): FREET3  Other results:   Imaging    No results found.   Medications:     Scheduled Medications: . amLODipine  5 mg Oral BID  . aspirin EC  81 mg Oral Daily  . atorvastatin  80 mg Oral q1800  .  azithromycin  500 mg Oral Daily  . calcitRIOL  0.5 mcg Oral Once per day on Mon Fri  . carvedilol  9.375 mg Oral BID WC  . cholecalciferol  1,000 Units Oral BID  . clonazePAM  0.5 mg Oral BID  . docusate sodium  200 mg Oral QHS  . ferrous sulfate  325 mg Oral BID WC  . guaiFENesin  1,200 mg Oral BID  . hydrALAZINE  75 mg Oral Q8H  . insulin aspart  0-15 Units Subcutaneous TID WC  . insulin aspart  3 Units Subcutaneous TID WC  . insulin detemir  20 Units Subcutaneous BID  . isosorbide mononitrate  30 mg Oral Daily  . sertraline  100 mg Oral Daily  . sodium chloride flush  3 mL Intravenous Q12H  . sodium chloride flush  3 mL Intravenous Q12H  . traZODone  50 mg Oral QHS  . trimethoprim-polymyxin b  2 drop Both Eyes BID    Infusions: . sodium chloride 250 mL (06/14/17 1242)  . heparin 1,300 Units/hr (06/16/17 0605)    PRN Medications: sodium chloride, acetaminophen **OR** acetaminophen, acetaminophen, cloNIDine, diclofenac sodium, ipratropium-albuterol, ondansetron (ZOFRAN) IV, ondansetron, oxyCODONE, promethazine, sodium chloride, sodium chloride flush    Patient Profile   72 yo with history of CKD stage IV, diastolic CHF, HTN, ?cirrhosis, pulmonary hypertension was admitted with acute on chronic diastolic CHF.   Assessment/Plan   1. Acute on chronic diastolic CHF: Echo 9/14 with EF 65-70%, mildly dilated RV.  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 [place] 06/12/17, however, showed pulmonary venous hypertension and elevated filling pressures with PVR about 2 WU.   - Volume status stable. Hold diuretics today.  - She may benefit from Cardiomems as outpatient => will workup for this.  2. 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  Echo with mildly dilated RV.  V/Q scan with no evidence for chronic PE. RHC 1/19 (this admission), however, showed  primarily pulmonary venous hypertension with PVR only 2 WU. - Treatment at this point will be diuretics rather than selective pulmonary vasodilators.   No change.  3. CKD: Stage 3-4.   Creatinine trending up 2.2>2.3  Followed by nephrology in [place].  4. HTN:  - Elevated.  - Increase coreg to 12.5 mg twice a day.  - Continue hydralazine, amlodipine.   5. Cirrhosis: RUQ [place] suggestive of cirrhosis.  6. Abd pain: RUQ [place] did not show evidence of cholecystitis.  Denies abdominal pain.   -  Continue as needed Zofran.  7. ID: Fever 1/29. T Max 99.3.  RSV on respiratory culture.  CXR unremarkable, UA negative.  Cultures so far negative.  Not on antibiotics.  8. Elevated troponin: Had episode of chest pain early am 1/29 in setting of nausea/vomiting.  Had chest pain again 1/30 in setting of SVT/NSVT.  ?ACS/NSTEMI versus demand ischemia with volume overload/HTN/tachycardia in setting of elevated creatinine. Troponin trended up to 1.08 then down. No s/s ischemia  - ASA 81 - Continue Imdur 30 daily.  - Continue heparin gtt for now.  - Continue atorvastatin 80 mg daily.  - Continue  Coreg.  - Hold off on cath. Renal function up again.   9. SVT/NSVT: Patient appeared to have 2 tachyarrhythmias on 1/30 => there was a narrow complex SVT and there was also another tachyarrhythmia where the axis changed though QRS only mildly widened => possible NSVT.  Recent echo with normal EF.  - As above increase coreg to 12.5 mg twice a day.  10. Depression   Length of Stay: 6  Amy Clegg, NP  06/16/2017, 7:34 AM  Advanced Heart Failure Team Pager [phone] (M-F; 7a - 4p)  Please contact [facility] for night-coverage after hours (4p -7a ) and weekends on [email or web address]  Patient seen with NP, agree with the above note.  Creatinine up a bit to 2.3, no further chest pain.  No further arrhythmia on Coreg.  On exam, JVP 7-8 cm.  Lungs with mild crackles at bases.  Depressed affect.   I am going to hold off on  cath for now.  Creatinine up to 2.3 and she has RSV respiratory infection with low grade fever and malaise.  - Will follow, possible LHC in future when creatinine comes down some.  No rush, no current ACS.  - Can stop IV heparin and resume  heparin.   Weight down considerably, hold Lasix today and resume tomorrow at 40 mg po daily.   With ongoing elevated BP, increase Coreg to 12.5 mg bid.   She is depressed and would benefit from treatment of this long-term.   She will need SNF at rehab.   Loralie Champagne 06/16/2017 1:23 PM

## 2017-06-17 ENCOUNTER — Inpatient Hospital Stay (HOSPITAL_COMMUNITY): Payer: Medicare HMO

## 2017-06-17 DIAGNOSIS — R1013 Epigastric pain: Secondary | ICD-10-CM

## 2017-06-17 DIAGNOSIS — E785 Hyperlipidemia, unspecified: Secondary | ICD-10-CM

## 2017-06-17 LAB — GLUCOSE, CAPILLARY
GLUCOSE-CAPILLARY: 173 mg/dL — AB (ref 65–99)
GLUCOSE-CAPILLARY: 311 mg/dL — AB (ref 65–99)
Glucose-Capillary: 298 mg/dL — ABNORMAL HIGH (ref 65–99)
Glucose-Capillary: 281 mg/dL — ABNORMAL HIGH (ref 65–99)

## 2017-06-17 LAB — CBC WITH DIFFERENTIAL/PLATELET
BASOS ABS: 0 10*3/uL (ref 0.0–0.1)
Basophils Relative: 0 %
EOS PCT: 5 %
Eosinophils Absolute: 0.3 10*3/uL (ref 0.0–0.7)
HEMATOCRIT: 27.1 % — AB (ref 36.0–46.0)
Hemoglobin: 8.7 g/dL — ABNORMAL LOW (ref 12.0–15.0)
LYMPHS ABS: 0.8 10*3/uL (ref 0.7–4.0)
LYMPHS PCT: 13 %
MCH: 28.2 pg (ref 26.0–34.0)
MCHC: 32.1 g/dL (ref 30.0–36.0)
MCV: 87.7 fL (ref 78.0–100.0)
MONO ABS: 0.4 10*3/uL (ref 0.1–1.0)
Monocytes Relative: 7 %
Neutro Abs: 4.2 10*3/uL (ref 1.7–7.7)
Neutrophils Relative %: 75 %
PLATELETS: 112 10*3/uL — AB (ref 150–400)
RBC: 3.09 MIL/uL — ABNORMAL LOW (ref 3.87–5.11)
RDW: 16 % — AB (ref 11.5–15.5)
WBC: 5.6 10*3/uL (ref 4.0–10.5)

## 2017-06-17 LAB — COMPREHENSIVE METABOLIC PANEL
ALT: 16 U/L (ref 14–54)
AST: 34 U/L (ref 15–41)
Albumin: 2.7 g/dL — ABNORMAL LOW (ref 3.5–5.0)
Alkaline Phosphatase: 65 U/L (ref 38–126)
Anion gap: 11 (ref 5–15)
BUN: 33 mg/dL — AB (ref 6–20)
CO2: 25 mmol/L (ref 22–32)
Calcium: 8.8 mg/dL — ABNORMAL LOW (ref 8.9–10.3)
Chloride: 100 mmol/L — ABNORMAL LOW (ref 101–111)
Creatinine, Ser: 2.11 mg/dL — ABNORMAL HIGH (ref 0.44–1.00)
GFR calc Af Amer: 26 mL/min — ABNORMAL LOW (ref >60)
GFR, EST NON AFRICAN AMERICAN: 22 mL/min — AB (ref >60)
Glucose, Bld: 185 mg/dL — ABNORMAL HIGH (ref 65–99)
POTASSIUM: 3.1 mmol/L — AB (ref 3.5–5.1)
SODIUM: 136 mmol/L (ref 135–145)
Total Bilirubin: 0.6 mg/dL (ref 0.3–1.2)
Total Protein: 6.2 g/dL — ABNORMAL LOW (ref 6.5–8.1)

## 2017-06-17 LAB — MAGNESIUM: MAGNESIUM: 2.3 mg/dL (ref 1.7–2.4)

## 2017-06-17 LAB — PROTIME-INR
INR: 1.13
PROTHROMBIN TIME: 14.4 s (ref 11.4–15.2)

## 2017-06-17 LAB — HEPARIN LEVEL (UNFRACTIONATED): Heparin Unfractionated: <0.1 IU/mL — ABNORMAL LOW (ref 0.30–0.70)

## 2017-06-17 LAB — PHOSPHORUS: PHOSPHORUS: 3.1 mg/dL (ref 2.5–4.6)

## 2017-06-17 LAB — AMMONIA: Ammonia: 51 umol/L — ABNORMAL HIGH (ref 9–35)

## 2017-06-17 MED ORDER — CLONAZEPAM 0.5 MG PO TABS
0.5000 mg | ORAL_TABLET | Freq: Every day | ORAL | Status: DC
  Administered 2017-06-18 – 2017-06-19 (×2): 0.5 mg via ORAL
  Filled 2017-06-17 (×2): qty 1

## 2017-06-17 MED ORDER — POTASSIUM CHLORIDE CRYS ER 10 MEQ PO TBCR
40.0000 meq | EXTENDED_RELEASE_TABLET | Freq: Two times a day (BID) | ORAL | Status: AC
  Administered 2017-06-17 (×2): 40 meq via ORAL
  Filled 2017-06-17 (×2): qty 4

## 2017-06-17 MED ORDER — SERTRALINE HCL 50 MG PO TABS
150.0000 mg | ORAL_TABLET | Freq: Every day | ORAL | Status: DC
  Administered 2017-06-18 – 2017-06-19 (×2): 150 mg via ORAL
  Filled 2017-06-17 (×2): qty 1

## 2017-06-17 MED ORDER — MENTHOL 3 MG MT LOZG
1.0000 | LOZENGE | OROMUCOSAL | Status: DC | PRN
  Filled 2017-06-17: qty 9

## 2017-06-17 MED ORDER — LACTULOSE 10 GM/15ML PO SOLN
20.0000 g | Freq: Two times a day (BID) | ORAL | Status: DC
  Administered 2017-06-17 – 2017-06-18 (×3): 20 g via ORAL
  Filled 2017-06-17 (×3): qty 30

## 2017-06-17 MED ORDER — TRAZODONE HCL 100 MG PO TABS
100.0000 mg | ORAL_TABLET | Freq: Every day | ORAL | Status: DC
  Administered 2017-06-17 – 2017-06-18 (×2): 100 mg via ORAL
  Filled 2017-06-17 (×2): qty 1

## 2017-06-17 NOTE — Progress Notes (Signed)
Ordered pt TED hose, pt does not want to wear at this time  Pt educated and pt still refused

## 2017-06-17 NOTE — Progress Notes (Signed)
Progress Note  Patient Name: Stacy Smith Date of Encounter: 06/17/2017  Primary Cardiologist: Carlyle Dolly, MD   Subjective   Feels terrible.  She is unable to articulate why.  She denies chest pain.  She is bothered by her productive cough.  Inpatient Medications    Scheduled Meds: . amLODipine  5 mg Oral BID  . aspirin EC  81 mg Oral Daily  . atorvastatin  80 mg Oral q1800  . azithromycin  500 mg Oral Daily  . calcitRIOL  0.5 mcg Oral Once per day on Mon Fri  . carvedilol  12.5 mg Oral BID WC  . cholecalciferol  1,000 Units Oral BID  . clonazePAM  0.5 mg Oral BID  . docusate sodium  200 mg Oral QHS  . feeding supplement (ENSURE ENLIVE)  237 mL Oral TID BM  . ferrous sulfate  325 mg Oral BID WC  . furosemide  40 mg Oral Daily  . guaiFENesin  1,200 mg Oral BID  . heparin injection (subcutaneous)  5,000 Units Subcutaneous Q8H  . hydrALAZINE  75 mg Oral Q8H  . insulin aspart  0-15 Units Subcutaneous TID WC  . insulin aspart  3 Units Subcutaneous TID WC  . insulin detemir  20 Units Subcutaneous BID  . isosorbide mononitrate  30 mg Oral Daily  . lactulose  20 g Oral BID  . potassium chloride  40 mEq Oral BID  . sertraline  100 mg Oral Daily  . sodium chloride flush  3 mL Intravenous Q12H  . sodium chloride flush  3 mL Intravenous Q12H  . traZODone  50 mg Oral QHS  . trimethoprim-polymyxin b  2 drop Both Eyes BID   Continuous Infusions: . sodium chloride 250 mL (06/14/17 1242)   PRN Meds: sodium chloride, acetaminophen **OR** acetaminophen, acetaminophen, cloNIDine, diclofenac sodium, ipratropium-albuterol, ondansetron (ZOFRAN) IV, ondansetron, oxyCODONE, promethazine, sodium chloride, sodium chloride flush   Vital Signs    Vitals:   06/16/17 1148 06/16/17 1533 06/16/17 2039 06/17/17 0603  BP: (!) 150/51 (!) 151/58 (!) 151/51 (!) 158/63  Pulse: 83 83 80 76  Resp:  17 16 16   Temp: 98.8 F (37.1 C) 98.2 F (36.8 C) 98.5 F (36.9 C) 99.6 F (37.6 C)    TempSrc: Oral Oral Oral Oral  SpO2: 96% 96% 96% 100%  Weight:    155 lb 8 oz (70.5 kg)  Height:        Intake/Output Summary (Last 24 hours) at 06/17/2017 0917 Last data filed at 06/17/2017 0653 Gross per 24 hour  Intake 1063 ml  Output 550 ml  Net 513 ml   Filed Weights   06/15/17 0400 06/16/17 0606 06/17/17 0603  Weight: 167 lb 5.3 oz (75.9 kg) 163 lb 9.3 oz (74.2 kg) 155 lb 8 oz (70.5 kg)    Telemetry    Sinus rhythm.  No events- Personally Reviewed  ECG    n/a - Personally Reviewed  Physical Exam   VS:  BP (!) 158/63 (BP Location: Left Arm)   Pulse 76   Temp 99.6 F (37.6 C) (Oral)   Resp 16   Ht 5' (1.524 m)   Wt 155 lb 8 oz (70.5 kg)   SpO2 100%   BMI 30.37 kg/m  , BMI Body mass index is 30.37 kg/m. GENERAL:  Ill-appearing.  Coughing frequently HEENT: Pupils equal round and reactive, fundi not visualized, oral mucosa unremarkable NECK: JVD to 2cm above clavicle at 45 degrees  , waveform within normal limits, carotid  upstroke brisk and symmetric, no bruits LUNGS: Mild diffuse expiratory wheezing.  No crackles or rhonchi. HEART:  RRR.  PMI not displaced or sustained,S1 and S2 within normal limits, no S3, no S4, no clicks, no rubs, no murmurs ABD:  Flat, positive bowel sounds normal in frequency in pitch, no bruits, no rebound, no guarding, no midline pulsatile mass, no hepatomegaly, no splenomegaly EXT:  2 plus pulses throughout, no edema, no cyanosis no clubbing SKIN:  No rashes no nodules NEURO:  Cranial nerves II through XII grossly intact, motor grossly intact throughout [facility]:  Cognitively intact, oriented to person place and time  Labs    Chemistry Recent Labs  Lab 06/15/17 0600 06/16/17 0536 06/17/17 0453  NA 137 139 136  K 3.8 3.5 3.1*  CL 102 103 100*  CO2 24 23 25   GLUCOSE 155* 251* 185*  BUN 31* 32* 33*  CREATININE 2.23* 2.34* 2.11*  CALCIUM 8.7* 8.8* 8.8*  PROT 6.4* 6.2* 6.2*  ALBUMIN 2.8* 2.8* 2.7*  AST 37 40 34  ALT 15 16 16    ALKPHOS 67 72 65  BILITOT 0.9 0.7 0.6  GFRNONAA 21* 20* 22*  GFRAA 24* 23* 26*  ANIONGAP 11 13 11      Hematology Recent Labs  Lab 06/15/17 0600 06/16/17 0536 06/17/17 0453  WBC 7.4 5.1 5.6  RBC 3.78* 3.58* 3.09*  HGB 10.3* 9.7* 8.7*  HCT 33.3* 31.8* 27.1*  MCV 88.1 88.8 87.7  MCH 27.2 27.1 28.2  MCHC 30.9 30.5 32.1  RDW 16.5* 16.6* 16.0*  PLT 138* 117* 112*    Cardiac Enzymes Recent Labs  Lab 06/13/17 1726 06/13/17 2316 06/14/17 0618 06/14/17 1115  TROPONINI 0.39* 0.89* 1.08* 0.98*    Recent Labs  Lab 06/10/17 1053  TROPIPOC 0.02     BNP Recent Labs  Lab 06/10/17 1538 06/11/17 0632  BNP 394.7* 470.6*     DDimer No results for input(s): DDIMER in the last 168 hours.   Radiology    Dg Chest Port 1 View  Result Date: 06/16/2017 CLINICAL DATA:  Shortness of breath EXAM: PORTABLE CHEST 1 VIEW COMPARISON:  Two days ago FINDINGS: Stable borderline cardiomegaly. Stable mediastinal contours. There is no edema, consolidation, effusion, or pneumothorax. IMPRESSION: No evidence of active disease. Electronically Signed   By: Monte Fantasia M.D.   On: 06/16/2017 08:13    Cardiac Studies    RHC (1/18): mean RA 8, PA 78/25 mean 45, mean PCWP 18, CI 3.6, PVR 4.6 WU Echo (9/18): EF 60-65%, normal RV systolic function, PASP 58, mild TR and MR.  Echo (1/19): EF 65-70%, mild aortic stenosis, mildly dilated RV normal systolic function, PASP 60 mmHg.  V/Q scan (1/19): No PE Abdominal US (1/19): Liver appears cirrhotic. No gallstones  RHC Procedural Findings (06/12/17): Hemodynamics (mmHg) RA mean 13 RV 65/15 PA 61/27, mean 39 PCWP mean 20 Oxygen saturations: PA 80% AO 97% Cardiac Output (Fick) 10.1  Cardiac Index (Fick) 5.87 PVR 1.9 WU Cardiac Output (Thermo) 9.23 Cardiac Output (Thermo) 5.34 PVR 2 WU   Patient Profile     72 y.o. female WITH severe pulmonary hypertension, CKD IV, hypertension, and possible cirrhosis admitted with acute on chronic  diastolic heart failure.  Assessment & Plan    # Acute on chronic diastolic heart failure: # Severe pulmonary hypertension:  PA 61/27, mean 39: PVR 2 on this admission.  Therefore she was not started on any PAH treatments.  Echo with mildly dilated RV.  No evidence of chronic PE.  Continue diuresis.  She has been switched from IV to oral.  She did not receive any Lasix on 2/1 and was +500 mL.  Her weight is inaccurate as it went from 163 pounds yesterday to 155 pounds today.  # CKD III-IV:  Creatinine slightly better today at 2.11.  Switched to oral lasix.   # Hypertension: Carvedilol was increased 2/1.   # Cirrhosis: RUQ suggestive of cirrhosis.  # RSV: Positive on respiratory viral panel. Patient continues to feel poorly and have cough.  # Elevated troponin: Patient wasn't cathed 2/2 acute on chronic renal failure and RSV infection. Sh has no chest pain or ischemic changes on EKG.  Consider in future.     For questions or updates, please contact [facility] Please consult [email or web address] for contact info under Cardiology/STEMI.      Signed, Skeet Latch, MD  06/17/2017, 9:17 AM

## 2017-06-17 NOTE — Consult Note (Signed)
[facility] Consult   Reason for Consult:  Depression not improving Referring Physician:  Dr. Alfredia Ferguson Patient Identification: Stacy Smith MRN:  [id number] Principal Diagnosis: Major depression, recurrent, chronic ([place]) Diagnosis:   Patient Active Problem List   Diagnosis Date Noted  . Acute diastolic CHF (congestive heart failure) ([place]) [I50.31] 06/13/2017  . 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 Hyponatremia [E87.1] 06/10/2017  . Iron deficiency anemia due to chronic blood loss [D50.0] 06/10/2017  . Major depression, recurrent, chronic ([place]) [F33.9] 06/10/2017  . Pulmonary HTN ([place]) [I27.20]   . Seizures ([place]) [R56.9]   . Delirium [R41.0]   . Diabetes type 2, uncontrolled ([place]) [E11.65]   . Demand ischemia ([place]) [I24.8]   . Sinus tachycardia [R00.0]   . Ischemic chest pain [I25.9]   . Diabetes type 2, controlled ([place]) [E11.9]   . Convulsion ([place]) [R56.9]   . Altered mental state [R41.82]   . HLD (hyperlipidemia) [E78.5]   . Rheumatoid arthritis ([place]) [M06.9]   . Lupus (systemic lupus erythematosus) ([place]) [M32.9]   . Acute kidney injury superimposed on chronic kidney disease ([place]) [N17.9, N18.9]   . Hypokalemia [E87.6]   . Hypomagnesemia [E83.42]   . Convulsions/seizures ([place]) [R56.9] 01/22/2015  . Diabetes ([place]) [E11.9] 01/22/2015  . Essential hypertension [I10] 01/22/2015  . Hyperlipidemia [E78.5] 01/22/2015  . 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 Patient continues to report feeling depressed, lonely, low energy level, lack motivation and trouble fallin and staying asleep due to excessive worries about her numerous medical issues.  She also ruminates about her husband of  27 years who died in July 26, 2016. Patient denies suicidal thoughts, psychosis or delusional thinking.  Past Psychiatric History: as above  Risk to Self: Is patient at risk for suicide?: No Risk to Others:   Prior Inpatient Therapy:   Prior Outpatient Therapy:    Past Medical History:  Past Medical History:  Diagnosis Date  . Anxiety   . Bradycardia   . CHF (congestive heart failure) ([place])   . Chronic kidney disease    CKD Stage 3  . Depression   . Diabetes mellitus without complication ([place])   . Hyperkalemia   . Hypertension   . Lupus   . Peripheral neuropathy   . Rheumatoid arthritis ([place])   . Seizures ([place])   . Shortness of breath dyspnea     Past Surgical History:  Procedure Laterality Date  . ABDOMINAL HYSTERECTOMY    . APPENDECTOMY    . CARDIAC CATHETERIZATION N/A 05/18/2016   Procedure: Right Heart Cath;  Surgeon: Burnell Blanks, MD;  Location: [facility];  Service: Cardiovascular;  Laterality: N/A;  . CYST EXCISION    . DENTAL SURGERY  01/18/15  . RIGHT HEART CATH N/A 06/12/2017   Procedure: RIGHT HEART CATH;  Surgeon: Larey Dresser, MD;  Location: [facility];  Service: Cardiovascular;  Laterality: N/A;   Family History:  Family History  Problem Relation Age of Onset  . Heart failure Mother   . Liver cancer Father   . Lung cancer Father   . Cancer Sister   . Heart murmur Brother    Family Psychiatric  History:  Social History:  Social History   Substance and Sexual Activity  Alcohol Use  No  . Alcohol/week: 0.0 oz     Social History   Substance and Sexual Activity  Drug Use No    Social History   Socioeconomic History  . Marital status: Widowed    Spouse name: None  . Number of children: None  . Years of education: None  . Highest education level: None  Social Needs  . Financial resource strain: None  . Food insecurity - worry: None  . Food insecurity - inability: None  .  Transportation needs - medical: None  . Transportation needs - non-medical: None  Occupational History  . None  Tobacco Use  . Smoking status: Never Smoker  . Smokeless tobacco: Never Used  Substance and Sexual Activity  . Alcohol use: No    Alcohol/week: 0.0 oz  . Drug use: No  . Sexual activity: None  Other Topics Concern  . None  Social History Narrative  . None   Additional Social History:    Allergies:   Allergies  Allergen Reactions  . Phenytoin Sodium Extended Itching  . Latex Swelling    Labs:  Results for orders placed or performed during the hospital encounter of 06/10/17 (from the past 48 hour(s))  Heparin level (unfractionated)     Status: Abnormal   Collection Time: 06/15/17  4:47 PM  Result Value Ref Range   Heparin Unfractionated 0.23 (L) 0.30 - 0.70 IU/mL    Comment:        IF HEPARIN RESULTS ARE BELOW EXPECTED VALUES, AND PATIENT DOSAGE HAS BEEN CONFIRMED, SUGGEST FOLLOW UP TESTING OF ANTITHROMBIN III LEVELS.   Glucose, capillary     Status: Abnormal   Collection Time: 06/15/17  4:59 PM  Result Value Ref Range   Glucose-Capillary 132 (H) 65 - 99 mg/dL   Comment 1 Notify RN   Glucose, capillary     Status: Abnormal   Collection Time: 06/15/17  9:12 PM  Result Value Ref Range   Glucose-Capillary 183 (H) 65 - 99 mg/dL  Heparin level (unfractionated)     Status: Abnormal   Collection Time: 06/16/17  5:36 AM  Result Value Ref Range   Heparin Unfractionated 0.29 (L) 0.30 - 0.70 IU/mL    Comment:        IF HEPARIN RESULTS ARE BELOW EXPECTED VALUES, AND PATIENT DOSAGE HAS BEEN CONFIRMED, SUGGEST FOLLOW UP TESTING OF ANTITHROMBIN III LEVELS. Performed at [facility], [place] [address] [place], [place] [address]   Ammonia     Status: Abnormal   Collection Time: 06/16/17  5:36 AM  Result Value Ref Range   Ammonia 46 (H) 9 - 35 umol/L    Comment: Performed at [facility], [place] [address] [place], [place] [address]  CBC with  Differential/Platelet     Status: Abnormal   Collection Time: 06/16/17  5:36 AM  Result Value Ref Range   WBC 5.1 4.0 - 10.5 K/uL   RBC 3.58 (L) 3.87 - 5.11 MIL/uL   Hemoglobin 9.7 (L) 12.0 - 15.0 g/dL   HCT 31.8 (L) 36.0 - 46.0 %   MCV 88.8 78.0 - 100.0 fL   MCH 27.1 26.0 - 34.0 pg   MCHC 30.5 30.0 - 36.0 g/dL   RDW 16.6 (H) 11.5 - 15.5 %   Platelets 117 (L) 150 - 400 K/uL    Comment: PLATELET COUNT CONFIRMED BY SMEAR   Neutrophils Relative % 76 %   Neutro Abs 3.8 1.7 - 7.7 K/uL   Lymphocytes Relative 15 %  Lymphs Abs 0.8 0.7 - 4.0 K/uL   Monocytes Relative 6 %   Monocytes Absolute 0.3 0.1 - 1.0 K/uL   Eosinophils Relative 3 %   Eosinophils Absolute 0.2 0.0 - 0.7 K/uL   Basophils Relative 0 %   Basophils Absolute 0.0 0.0 - 0.1 K/uL    Comment: Performed at [facility] [address] [place], [place] [address]  Comprehensive metabolic panel     Status: Abnormal   Collection Time: 06/16/17  5:36 AM  Result Value Ref Range   Sodium 139 135 - 145 mmol/L   Potassium 3.5 3.5 - 5.1 mmol/L   Chloride 103 101 - 111 mmol/L   CO2 23 22 - 32 mmol/L   Glucose, Bld 251 (H) 65 - 99 mg/dL   BUN 32 (H) 6 - 20 mg/dL   Creatinine, Ser 2.34 (H) 0.44 - 1.00 mg/dL   Calcium 8.8 (L) 8.9 - 10.3 mg/dL   Total Protein 6.2 (L) 6.5 - 8.1 g/dL   Albumin 2.8 (L) 3.5 - 5.0 g/dL   AST 40 15 - 41 U/L   ALT 16 14 - 54 U/L   Alkaline Phosphatase 72 38 - 126 U/L   Total Bilirubin 0.7 0.3 - 1.2 mg/dL   GFR calc non Af Amer 20 (L) >60 mL/min   GFR calc Af Amer 23 (L) >60 mL/min    Comment: (NOTE) The eGFR has been calculated using the CKD EPI equation. This calculation has not been validated in all clinical situations. eGFR's persistently <60 mL/min signify possible Chronic Kidney Disease.    Anion gap 13 5 - 15    Comment: Performed at [facility] [address], [place], [place] [address]  Magnesium     Status: None   Collection Time: 06/16/17  5:36 AM  Result Value Ref Range    Magnesium 2.2 1.7 - 2.4 mg/dL    Comment: Performed at [facility] [address], [place], [place] [address]  Phosphorus     Status: None   Collection Time: 06/16/17  5:36 AM  Result Value Ref Range   Phosphorus 3.9 2.5 - 4.6 mg/dL    Comment: Performed at [facility] [address], [place], [place] [address]  Protime-INR     Status: Abnormal   Collection Time: 06/16/17  5:36 AM  Result Value Ref Range   Prothrombin Time 15.3 (H) 11.4 - 15.2 seconds   INR 1.22     Comment: Performed at [facility] [address], [place], [place] [address]  Glucose, capillary     Status: Abnormal   Collection Time: 06/16/17  7:31 AM  Result Value Ref Range   Glucose-Capillary 227 (H) 65 - 99 mg/dL   Comment 1 Notify RN   Glucose, capillary     Status: Abnormal   Collection Time: 06/16/17 11:28 AM  Result Value Ref Range   Glucose-Capillary 272 (H) 65 - 99 mg/dL   Comment 1 Notify RN   Glucose, capillary     Status: Abnormal   Collection Time: 06/16/17  4:57 PM  Result Value Ref Range   Glucose-Capillary 338 (H) 65 - 99 mg/dL  Glucose, capillary     Status: Abnormal   Collection Time: 06/16/17  9:18 PM  Result Value Ref Range   Glucose-Capillary 254 (H) 65 - 99 mg/dL   Comment 1 Notify RN    Comment 2 Document in Chart   CBC with Differential/Platelet  Status: Abnormal   Collection Time: 06/17/17  4:53 AM  Result Value Ref Range   WBC 5.6 4.0 - 10.5 K/uL   RBC 3.09 (L) 3.87 - 5.11 MIL/uL   Hemoglobin 8.7 (L) 12.0 - 15.0 g/dL   HCT 27.1 (L) 36.0 - 46.0 %   MCV 87.7 78.0 - 100.0 fL   MCH 28.2 26.0 - 34.0 pg   MCHC 32.1 30.0 - 36.0 g/dL   RDW 16.0 (H) 11.5 - 15.5 %   Platelets 112 (L) 150 - 400 K/uL    Comment: CONSISTENT WITH PREVIOUS RESULT   Neutrophils Relative % 75 %   Neutro Abs 4.2 1.7 - 7.7 K/uL   Lymphocytes Relative 13 %   Lymphs Abs 0.8 0.7 - 4.0 K/uL   Monocytes Relative 7 %   Monocytes Absolute 0.4 0.1 - 1.0 K/uL   Eosinophils Relative 5 %    Eosinophils Absolute 0.3 0.0 - 0.7 K/uL   Basophils Relative 0 %   Basophils Absolute 0.0 0.0 - 0.1 K/uL    Comment: Performed at [facility], [address] [place], [place] [address]  Heparin level (unfractionated)     Status: Abnormal   Collection Time: 06/17/17  4:53 AM  Result Value Ref Range   Heparin Unfractionated <0.10 (L) 0.30 - 0.70 IU/mL    Comment:        IF HEPARIN RESULTS ARE BELOW EXPECTED VALUES, AND PATIENT DOSAGE HAS BEEN CONFIRMED, SUGGEST FOLLOW UP TESTING OF ANTITHROMBIN III LEVELS. Performed at [facility], [place] [address], [place], [place] [address]   Comprehensive metabolic panel     Status: Abnormal   Collection Time: 06/17/17  4:53 AM  Result Value Ref Range   Sodium 136 135 - 145 mmol/L   Potassium 3.1 (L) 3.5 - 5.1 mmol/L   Chloride 100 (L) 101 - 111 mmol/L   CO2 25 22 - 32 mmol/L   Glucose, Bld 185 (H) 65 - 99 mg/dL   BUN 33 (H) 6 - 20 mg/dL   Creatinine, Ser 2.11 (H) 0.44 - 1.00 mg/dL   Calcium 8.8 (L) 8.9 - 10.3 mg/dL   Total Protein 6.2 (L) 6.5 - 8.1 g/dL   Albumin 2.7 (L) 3.5 - 5.0 g/dL   AST 34 15 - 41 U/L   ALT 16 14 - 54 U/L   Alkaline Phosphatase 65 38 - 126 U/L   Total Bilirubin 0.6 0.3 - 1.2 mg/dL   GFR calc non Af Amer 22 (L) >60 mL/min   GFR calc Af Amer 26 (L) >60 mL/min    Comment: (NOTE) The eGFR has been calculated using the CKD EPI equation. This calculation has not been validated in all clinical situations. eGFR's persistently <60 mL/min signify possible Chronic Kidney Disease.    Anion gap 11 5 - 15    Comment: Performed at [facility] [address], [place], [place] [address]  Magnesium     Status: None   Collection Time: 06/17/17  4:53 AM  Result Value Ref Range   Magnesium 2.3 1.7 - 2.4 mg/dL    Comment: Performed at [facility] [address] [place], [place] [address]  Phosphorus     Status: None   Collection Time: 06/17/17  4:53 AM  Result Value Ref Range   Phosphorus 3.1 2.5 -  4.6 mg/dL    Comment: Performed at [facility] [address] [place], [place] [address]  Ammonia  Status: Abnormal   Collection Time: 06/17/17  4:53 AM  Result Value Ref Range   Ammonia 51 (H) 9 - 35 umol/L    Comment: Performed at [facility], [place] [address], [place], [place] [address]  Protime-INR     Status: None   Collection Time: 06/17/17  4:53 AM  Result Value Ref Range   Prothrombin Time 14.4 11.4 - 15.2 seconds   INR 1.13     Comment: Performed at [facility] [address], [place], [place] [address]  Glucose, capillary     Status: Abnormal   Collection Time: 06/17/17  7:53 AM  Result Value Ref Range   Glucose-Capillary 173 (H) 65 - 99 mg/dL  Glucose, capillary     Status: Abnormal   Collection Time: 06/17/17 11:56 AM  Result Value Ref Range   Glucose-Capillary 298 (H) 65 - 99 mg/dL    Current Facility-Administered Medications  Medication Dose Route Frequency Provider Last Rate Last Dose  . 0.9 %  sodium chloride infusion  250 mL Intravenous PRN Larey Dresser, MD 5 mL/hr at 06/14/17 1242 250 mL at 06/14/17 1242  . acetaminophen (TYLENOL) tablet 650 mg  650 mg Oral Q6H PRN Lady Deutscher, MD   650 mg at 06/12/17 1952   Or  . acetaminophen (TYLENOL) suppository 650 mg  650 mg Rectal Q6H PRN Lady Deutscher, MD      . acetaminophen (TYLENOL) tablet 650 mg  650 mg Oral Q4H PRN Larey Dresser, MD   650 mg at 06/16/17 1437  . amLODipine (NORVASC) tablet 5 mg  5 mg Oral BID Lady Deutscher, MD   5 mg at 06/17/17 0815  . aspirin EC tablet 81 mg  81 mg Oral Daily Lady Deutscher, MD   81 mg at 06/17/17 0817  . atorvastatin (LIPITOR) tablet 80 mg  80 mg Oral q1800 Larey Dresser, MD   80 mg at 06/16/17 1659  . azithromycin (ZITHROMAX) tablet 500 mg  500 mg Oral Daily Raiford Noble [place], DO   500 mg at 06/17/17 4132  . calcitRIOL (ROCALTROL) capsule 0.5 mcg  0.5 mcg Oral Once per day on Mon Fri Lady Deutscher, MD   0.5 mcg at  06/16/17 1115  . carvedilol (COREG) tablet 12.5 mg  12.5 mg Oral BID WC Clegg, Amy D, NP   12.5 mg at 06/17/17 0650  . cholecalciferol (VITAMIN D) tablet 1,000 Units  1,000 Units Oral BID Lady Deutscher, MD   1,000 Units at 06/17/17 [phone]  . [START ON 06/18/2017] clonazePAM (KLONOPIN) tablet 0.5 mg  0.5 mg Oral Daily Kassie Keng, MD      . cloNIDine (CATAPRES) tablet 0.2 mg  0.2 mg Oral QID PRN Lady Deutscher, MD   0.2 mg at 06/14/17 0537  . diclofenac sodium (VOLTAREN) 1 % transdermal gel 2 g  2 g Topical QID PRN Raiford Noble Latif, DO   2 g at 06/17/17 0820  . docusate sodium (COLACE) capsule 200 mg  200 mg Oral QHS Lady Deutscher, MD   200 mg at 06/16/17 2220  . feeding supplement (ENSURE ENLIVE) (ENSURE ENLIVE) liquid 237 mL  237 mL Oral TID BM SheikhGeorgina Quint [place], DO   237 mL at 06/17/17 1203  . ferrous sulfate tablet 325 mg  325 mg Oral BID WC Lady Deutscher, MD   325 mg at 06/17/17 0816  . furosemide (LASIX) tablet 40 mg  40 mg Oral Daily  Larey Dresser, MD   40 mg at 06/17/17 0815  . guaiFENesin (MUCINEX) 12 hr tablet 1,200 mg  1,200 mg Oral BID Raiford Noble [place], DO   1,200 mg at 06/17/17 2671  . heparin injection 5,000 Units  5,000 Units Subcutaneous Q8H Larey Dresser, MD   5,000 Units at 06/17/17 1203  . hydrALAZINE (APRESOLINE) tablet 75 mg  75 mg Oral [address] [place], DO   75 mg at 06/17/17 1202  . insulin aspart (novoLOG) injection 0-15 Units  0-15 Units Subcutaneous TID WC Lady Deutscher, MD   8 Units at 06/17/17 1101  . insulin aspart (novoLOG) injection 3 Units  3 Units Subcutaneous TID WC Lady Deutscher, MD   3 Units at 06/16/17 1730  . insulin detemir (LEVEMIR) injection 20 Units  20 Units Subcutaneous BID Lady Deutscher, MD   20 Units at 06/17/17 [phone]  . ipratropium-albuterol (DUONEB) 0.5-2.5 (3) MG/3ML nebulizer solution 3 mL  3 mL Nebulization Q6H PRN Lady Deutscher, MD      . isosorbide mononitrate (IMDUR) 24 hr tablet 30 mg   30 mg Oral Daily Larey Dresser, MD   30 mg at 06/17/17 0816  . lactulose (CHRONULAC) 10 GM/15ML solution 20 g  20 g Oral BID Raiford Noble [place], DO   20 g at 06/17/17 0945  . ondansetron (ZOFRAN) injection 4 mg  4 mg Intravenous Q6H PRN Larey Dresser, MD   4 mg at 06/15/17 0231  . ondansetron (ZOFRAN) tablet 4 mg  4 mg Oral Q8H PRN Raiford Noble Latif, DO      . oxyCODONE (Oxy IR/ROXICODONE) immediate release tablet 5 mg  5 mg Oral Q6H PRN Lady Deutscher, MD   5 mg at 06/15/17 1331  . potassium chloride (K-DUR,KLOR-CON) CR tablet 40 mEq  40 mEq Oral BID Lovey Newcomer T, NP   40 mEq at 06/17/17 0650  . promethazine (PHENERGAN) injection 12.5 mg  12.5 mg Intravenous Q6H PRN Raiford Noble Latif, DO   12.5 mg at 06/15/17 1049  . [START ON 06/18/2017] sertraline (ZOLOFT) tablet 150 mg  150 mg Oral Daily Katherine Tout, MD      . sodium chloride (OCEAN) 0.65 % nasal spray 1 spray  1 spray Each Nare PRN Raiford Noble Latif, DO   1 spray at 06/16/17 1701  . sodium chloride flush (NS) 0.9 % injection 3 mL  3 mL Intravenous Q12H Lady Deutscher, MD   3 mL at 06/16/17 0828  . sodium chloride flush (NS) 0.9 % injection 3 mL  3 mL Intravenous Q12H Larey Dresser, MD   3 mL at 06/17/17 0818  . sodium chloride flush (NS) 0.9 % injection 3 mL  3 mL Intravenous PRN Larey Dresser, MD   3 mL at 06/13/17 2105  . traZODone (DESYREL) tablet 100 mg  100 mg Oral QHS Abbeygail Igoe, MD      . trimethoprim-polymyxin b (POLYTRIM) ophthalmic solution 2 drop  2 drop Both Eyes BID Lady Deutscher, MD   2 drop at 06/17/17 0818    Musculoskeletal: Strength & Muscle Tone: within normal limits Gait & Station: unsteady Patient leans: Front  Psychiatric Specialty Exam: Physical Exam  Psychiatric: Her speech is normal. Judgment and thought content normal. Her affect is blunt. She is withdrawn. Cognition and memory are normal. She exhibits a depressed mood.    Review of Systems  Constitutional: Positive  for malaise/fatigue.  HENT: Negative.   Eyes: Negative.  Respiratory: Negative.   Gastrointestinal: Negative.   Skin: Negative.   Neurological: Negative.   Psychiatric/Behavioral: Positive for depression. The patient has insomnia.     Blood pressure (!) 144/41, pulse 74, temperature 99 F (37.2 C), temperature source Oral, resp. rate 17, height 5' (1.524 m), weight 70.5 kg (155 lb 8 oz), SpO2 96 %.Body mass index is 30.37 kg/m.  General Appearance: Casual  Eye Contact:  Good  Speech:  Clear and Coherent  Volume:  Normal  Mood:  Dysphoric  Affect:  Constricted  Thought Process:  Coherent and Descriptions of Associations: Intact  Orientation:  Full (Time, Place, and Person)  Thought Content:  Logical  Suicidal Thoughts:  No  Homicidal Thoughts:  No  Memory:  Immediate;   Fair Recent;   Fair Remote;   Good  Judgement:  Intact  Insight:  Fair  Psychomotor Activity:  Psychomotor Retardation  Concentration:  Concentration: Fair and Attention Span: Fair  Recall:  Good  Fund of Knowledge:  Good  Language:  Good  Akathisia:  No  Handed:  Right  AIMS (if indicated):     Assets:  Communication Skills Desire for Improvement Social Support  ADL's:  Intact  Cognition:  WNL  Sleep:   poor     Treatment Plan Summary: Plan/Recomendation: -Decrease Clonazepam to 0.5 mg daily for anxiety/mood -Increased  Zoloft to 150 mg daily for depression -Consider increasing Trazodone to 100 mg at bedtime for sleep. -Re-consult psychiatric service if needed in the future.  Disposition: No evidence of imminent risk to self or others at present.   Patient does not meet criteria for psychiatric inpatient admission. Supportive therapy provided about ongoing stressors. Social worker to schedule patient with Dr. Posey Pronto in [place], [place] for medication management and therapy upon discharge  Corena Pilgrim, MD 06/17/2017 1:42 PM

## 2017-06-17 NOTE — NC FL2 (Signed)
[place] LEVEL OF CARE SCREENING TOOL     IDENTIFICATION  Patient Name: Stacy Smith Birthdate: Jun 04, 1945 Sex: female Admission Date (Current Location): 06/10/2017  [facility] and [place] Number:  ([place])   Facility and Address:  [facility] [place], [place] [address] [place], [place] [address]      Provider Number: [id number]  Attending Physician Name and Address:  Kerney Elbe, DO  Relative Name and Phone Number:  Lattie Haw, daughter, [phone]    Current Level of Care: Hospital Recommended Level of Care: [place] Prior Approval Number:    Date Approved/Denied:   PASRR Number:    Discharge Plan: SNF    Current Diagnoses: Patient Active Problem List   Diagnosis Date Noted  . Acute diastolic CHF (congestive heart failure) ([place]) 06/13/2017  . 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 Hyponatremia 06/10/2017  . Iron deficiency anemia due to chronic blood loss 06/10/2017  . Major depression, recurrent, chronic ([place]) 06/10/2017  . Pulmonary HTN ([place])   . Seizures ([place])   . Delirium   . Diabetes type 2, uncontrolled ([place])   . Demand ischemia ([place])   . Sinus tachycardia   . Ischemic chest pain   . Diabetes type 2, controlled ([place])   . Convulsion ([place])   . Altered mental state   . HLD (hyperlipidemia)   . Rheumatoid arthritis ([place])   . Lupus (systemic lupus erythematosus) ([place])   . Acute kidney injury superimposed on chronic kidney disease ([place])   . Hypokalemia   . Hypomagnesemia   . Convulsions/seizures ([place]) 01/22/2015  . Diabetes ([place]) 01/22/2015  . Essential hypertension 01/22/2015  . Hyperlipidemia 01/22/2015  . Seizure ([place]) 01/22/2015    Orientation RESPIRATION BLADDER Height & Weight     Self, Time, Situation, Place  Normal Continent, External catheter Weight: 70.5 kg (155 lb 8 oz) Height:  5' (152.4 cm)  BEHAVIORAL SYMPTOMS/MOOD NEUROLOGICAL BOWEL NUTRITION STATUS       Continent Diet(Please see DC Summary)  AMBULATORY STATUS COMMUNICATION OF NEEDS Skin   Limited Assist Verbally Normal                       Personal Care Assistance Level of Assistance  Bathing, Feeding, Dressing   Feeding assistance: Independent Dressing Assistance: Limited assistance     Functional Limitations Info  Sight, Hearing, Speech Sight Info: Adequate Hearing Info: Adequate Speech Info: Adequate    SPECIAL CARE FACTORS FREQUENCY  PT (By licensed PT), OT (By licensed OT)     PT Frequency: 5x/week OT Frequency: 3x/week            Contractures      Additional Factors Info  Code Status, Allergies, Psychotropic, Insulin Sliding Scale Code Status Info: Full Allergies Info: Phenytoin Sodium Extended, Latex Psychotropic Info: Klonopin; zoloft; trazadone Insulin Sliding Scale Info: 3x daily with meals       Current Medications (06/17/2017):  This is the current hospital active medication list Current Facility-Administered Medications  Medication Dose Route Frequency Provider Last Rate Last Dose  . 0.9 %  sodium chloride infusion  250 mL Intravenous PRN Larey Dresser, MD 5 mL/hr at 06/14/17 1242 250 mL at 06/14/17 1242  . acetaminophen (TYLENOL) tablet 650 mg  650 mg Oral Q6H PRN Lady Deutscher, MD   650 mg at 06/12/17 1952   Or  . acetaminophen (TYLENOL) suppository 650 mg  650 mg Rectal Q6H PRN Randa Spike  C, MD      . acetaminophen (TYLENOL) tablet 650 mg  650 mg Oral Q4H PRN Larey Dresser, MD   650 mg at 06/16/17 1437  . amLODipine (NORVASC) tablet 5 mg  5 mg Oral BID Lady Deutscher, MD   5 mg at 06/17/17 0815  . aspirin EC tablet 81 mg  81 mg Oral Daily Lady Deutscher, MD   81 mg at 06/17/17 0817  . atorvastatin (LIPITOR) tablet 80 mg  80 mg Oral q1800 Larey Dresser, MD   80 mg at 06/16/17 1659  . azithromycin (ZITHROMAX) tablet 500 mg  500 mg Oral Daily Raiford Noble [place], DO   500 mg at 06/17/17 4128  . calcitRIOL (ROCALTROL)  capsule 0.5 mcg  0.5 mcg Oral Once per day on Mon Fri Lady Deutscher, MD   0.5 mcg at 06/16/17 1115  . carvedilol (COREG) tablet 12.5 mg  12.5 mg Oral BID WC Clegg, Amy D, NP   12.5 mg at 06/17/17 0650  . cholecalciferol (VITAMIN D) tablet 1,000 Units  1,000 Units Oral BID Lady Deutscher, MD   1,000 Units at 06/17/17 [phone]  . clonazePAM (KLONOPIN) tablet 0.5 mg  0.5 mg Oral BID Lady Deutscher, MD   0.5 mg at 06/17/17 0813  . cloNIDine (CATAPRES) tablet 0.2 mg  0.2 mg Oral QID PRN Lady Deutscher, MD   0.2 mg at 06/14/17 0537  . diclofenac sodium (VOLTAREN) 1 % transdermal gel 2 g  2 g Topical QID PRN Raiford Noble Latif, DO   2 g at 06/17/17 0820  . docusate sodium (COLACE) capsule 200 mg  200 mg Oral QHS Lady Deutscher, MD   200 mg at 06/16/17 2220  . feeding supplement (ENSURE ENLIVE) (ENSURE ENLIVE) liquid 237 mL  237 mL Oral TID BM SheikhGeorgina Quint [place], DO   237 mL at 06/17/17 0817  . ferrous sulfate tablet 325 mg  325 mg Oral BID WC Lady Deutscher, MD   325 mg at 06/17/17 0816  . furosemide (LASIX) tablet 40 mg  40 mg Oral Daily Larey Dresser, MD   40 mg at 06/17/17 0815  . guaiFENesin (MUCINEX) 12 hr tablet 1,200 mg  1,200 mg Oral BID Raiford Noble [place], DO   1,200 mg at 06/17/17 6720  . heparin injection 5,000 Units  5,000 Units Subcutaneous Q8H Larey Dresser, MD   5,000 Units at 06/17/17 [phone]  . hydrALAZINE (APRESOLINE) tablet 75 mg  75 mg Oral [address] Malcolm, DO   75 mg at 06/17/17 9628  . insulin aspart (novoLOG) injection 0-15 Units  0-15 Units Subcutaneous TID WC Lady Deutscher, MD   11 Units at 06/16/17 1658  . insulin aspart (novoLOG) injection 3 Units  3 Units Subcutaneous TID WC Lady Deutscher, MD   3 Units at 06/16/17 1730  . insulin detemir (LEVEMIR) injection 20 Units  20 Units Subcutaneous BID Lady Deutscher, MD   20 Units at 06/17/17 [phone]  . ipratropium-albuterol (DUONEB) 0.5-2.5 (3) MG/3ML nebulizer solution 3 mL  3 mL  Nebulization Q6H PRN Lady Deutscher, MD      . isosorbide mononitrate (IMDUR) 24 hr tablet 30 mg  30 mg Oral Daily Larey Dresser, MD   30 mg at 06/17/17 0816  . lactulose (CHRONULAC) 10 GM/15ML solution 20 g  20 g Oral BID Sheikh, Omair Latif, DO      . ondansetron [facility]) injection 4 mg  4  mg Intravenous Q6H PRN Larey Dresser, MD   4 mg at 06/15/17 0231  . ondansetron (ZOFRAN) tablet 4 mg  4 mg Oral Q8H PRN Raiford Noble Latif, DO      . oxyCODONE (Oxy IR/ROXICODONE) immediate release tablet 5 mg  5 mg Oral Q6H PRN Lady Deutscher, MD   5 mg at 06/15/17 1331  . potassium chloride (K-DUR,KLOR-CON) CR tablet 40 mEq  40 mEq Oral BID Lovey Newcomer T, NP   40 mEq at 06/17/17 0650  . promethazine (PHENERGAN) injection 12.5 mg  12.5 mg Intravenous Q6H PRN Raiford Noble Latif, DO   12.5 mg at 06/15/17 1049  . sertraline (ZOLOFT) tablet 100 mg  100 mg Oral Daily Lady Deutscher, MD   100 mg at 06/17/17 0816  . sodium chloride (OCEAN) 0.65 % nasal spray 1 spray  1 spray Each Nare PRN Raiford Noble Latif, DO   1 spray at 06/16/17 1701  . sodium chloride flush (NS) 0.9 % injection 3 mL  3 mL Intravenous Q12H Lady Deutscher, MD   3 mL at 06/16/17 0828  . sodium chloride flush (NS) 0.9 % injection 3 mL  3 mL Intravenous Q12H Larey Dresser, MD   3 mL at 06/17/17 0818  . sodium chloride flush (NS) 0.9 % injection 3 mL  3 mL Intravenous PRN Larey Dresser, MD   3 mL at 06/13/17 2105  . traZODone (DESYREL) tablet 50 mg  50 mg Oral QHS Lady Deutscher, MD   50 mg at 06/16/17 2220  . trimethoprim-polymyxin b (POLYTRIM) ophthalmic solution 2 drop  2 drop Both Eyes BID Lady Deutscher, MD   2 drop at 06/17/17 0818     Discharge Medications: Please see discharge summary for a list of discharge medications.  Relevant Imaging Results:  Relevant Lab Results:   Additional Information SSN: [place], [place]

## 2017-06-17 NOTE — Progress Notes (Signed)
MD aware pt having poor intake, pt did not eat breakfast. MD aware pt did not receive meal coverage or sliding scale novolog

## 2017-06-17 NOTE — Progress Notes (Signed)
PROGRESS NOTE    KEARSTIN LEARN  POE:[id number] DOB: 09/20/1945 DOA: 06/10/2017 PCP: Jacqualine Code, DO   Brief Narrative:  Stacy Smith is an 72 y.o. female past medical history significant for pulmonary hypertension, with a right heart cath in January 2018 that showed pressure of 74,, chronic diastolic heart failure ,diabetes mellitus with chronic renal disease stage III and other comorbids who presents to the ED with dyspnea and lower extremity edema. Found to have an Acute Decompensation of Diastolic CHF and Pulmonary HTN. She was found to have an Elevated Troponin and ?CP so Cardiology started her on Heparin gtt but has not stopped. Patient now RSV + and plan was to go to Cath this afternoon if Cr is improve, however did not and so Cardiology did not Cath the patient. Patient continued to Complain of Abdominal Pain so CT Abdomen was ordered to further evaluate and showed Abdominal Varices and Moderate Ascites.    Assessment & Plan:   Principal Problem:   Major depression, recurrent, chronic (HCC) Active Problems:   Essential hypertension   Hyperlipidemia   Acute kidney injury superimposed on chronic kidney disease (HCC)   Diabetes type 2, uncontrolled (HCC)   Hyponatremia   Iron deficiency anemia due to chronic blood loss   Pulmonary HTN (HCC)   Acute diastolic CHF (congestive heart failure) (HCC)  Acute Decompensation of Diastolic CHF (congestive Heart Failure) (HCC) -VQ scan was normal  -Right heart cath showed moderate pulmonary venous hypertension,  -2D echo showed an EF of 65-75% with grade 1 diastolic heart failure with right-sided showed only mildly dilated right ventricle. -Cardiology restarted diuretics with Furosemide 40 mg po Daily  -Right heart cath on 06/12/2016 showed pulmonary venous hypertension with an elevated filling pressure.. -Strict I's/O's, Daily Weights SLIV -Patient is -5.360 Liters and Weight is down 25 lbs -Cardiology increased Carvedilol to 12.5  mg po BID  -Continue to Monitor Volume Status closely; Patient appears Euvolemic today  -Cards resumed po 40 mg Lasix today  -PT/OT Recommending SNF  Pulmonary Hypertension -Right heart cath on the 28th showed a mixed picture -ECHO showed mildly dilated RV -Cardiology holding diuretics today  -Per Cardiology, treatment will be diuretics rather than selective pulmonary vasodilators at this point -Per Cardiology po Lasix with 40 mg resumed    RSV Bronchitis -Patient's Respiratory Virus Panel was Positive  -Will add Azithromycin for Empiric Treatment x5 Days -Supportive Care with DuoNeb q6hprn -Added Flutter Valve and Incentive Spirometry -Repeat CXR showed no evidence of Active Disease   Nausea and Vomiting, and Abdominal Pain  -Added po/IV Zofran and Promethazine for Breakthrough Nausea/Vomiting -Discussed with patient about getting an Abdominal Scan and she adamantly refused 1/31 and agreeable today -CT Abd/Pelvis w/o Contrast showed 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 Interval development of abdominopelvic moderate ascites. Atrophic appearance of the pancreas and kidneys. Age-indeterminate L3 vertebral body grade 1 superior endplate fracture. -Will discuss with Gastroenterology and with Interventional Radiology in AM;  -Placed IR Consult and Will likely obtain a Diagnostic and Therapeutic Paracentesis   Acute kidney injury on chronic kidney disease stage IV -Baseline creatinine 1.8-1.9 -Creatinine has improved today and went from 2.35 -> 2.42 -> 2.24 -> 2.23 -> 2.34 -> 2.11 -Nephrology was consulted who agreed with IV diuresis now they have signed off.  -Cardiology changed IV diuresis to po Lasix and resumed 40 mg po daily  -Continue to monitor and repeat CMP in AM   Essential Hypertension -C/w Amlodipine 5 mg po  BID, Clonidine 0.2 mg po 4 Times daily PRN SBP>170, Isosorbide Mononitrate 30 mg po Daily, and Hydralazine 75 mg po q8h (increased by  Cardiology)  -Cardiology increased Coreg to 12.5 mg po BID -C/w Furosemide 40 mg po Daily per Cards   Hyperlipidemia -Lipid Panel showed Cholesterol of 166, HDL of 42, LDL 93, TG of 156, and VLDL of 31  -C/w Atorvastatin 80 mg po Daily  Normocytic Anemia -Decreased TIBC only, would favor due to chronic renal disease. -Hb/Hct went from 10.3/33.3 -> 9.9/32.2 -> 10.3/33.3 -> 9.7/31.8 -> 8.7/27.1 -Continue to Monitor for S/Sx of Bleeding; No longer on Heparin gtt -C/w Ferrous Sulfate 325 mg po BIDwm -Follow-up with Nephrology as an outpatient.  Major Depressive Disorder: -Psychiatry was re-consulted due to patient continuing to be Depressed. I spoke with Dr. Darleene Cleaver today and he adjusted some of the patient's Medication's around -Dr. Darleene Cleaver decreased 0.5 mg Daily for Anxiety/Mood, increased Sertraline to 150 mg po Daily for Depression, and recommended increasing Trazodone to 100 mg qHS  Rashes: Follow-up with PCP as an outpatient.  Cirrhosis: -U/S Abd RUQ showed Cirrhotic Appearing Liver and no focal lesion; no acute abnormalities and Negative for Gallstones -Platelet Count was 112 today  -Check Ammonia Level and was 46 to 51 -Added Laculose  -Check PT-INR; INR was 1.13 -CT Abd/Pelvis w/o Contrast showed 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 Interval development of abdominopelvic moderate ascites. Atrophic appearance of the pancreas and kidneys.  Hyperammonemia -Patient's Ammonia Level was 51 -Started Patient on Lactulose -Repeat Ammonial Level in AM   Hypokalemia -Patient's K+ was 3.1 -Replete with po KCl 40 meq x2 -Continue to Monitor and Replete as Necessary -Repeat CMP in AM  Elevated Troponin -Patient/s Troponin peaked at 1.08 -Cardiology stopped Heparin gtt yesterday  -C/w ASA 81 mg po Daily, Atorvastatin 80 mg po Daily, and added Carvedilol increased to 12.5 mg po BID -Cardiology also adding Imdur 30 mg po Daily  -Per Cardiology  Cath Cancelled and holding off Cath given Elevated Cr -Possible Cath when Cr improves  Tachyarrhythmias -Cardiology adding BB with Carvedilol and increased it to 12.5 mg BID   -Replete Electrolytes -Repeat EKG in AM   Diabetes Mellitus Type 2 -CBG's ranging from 173-298 -C/w Insulin Detemir 20 units sq BID and Novolog 3 units TIDwm, -C/w Moderate Novolog SSI  -IF remains elevated will adjust as necessary   Thrombocytopenia -Platelet Count was 112 today -Likely related to Liver Cirrhosis and Platelet Consumption  -Repeat CBC in AM   DVT prophylaxis: Heparin gtt stopped and now on Sq Heparin  Code Status: FULL CODE Family Communication: Discuss with Brother at bedside  Disposition Plan: Remain Inpatient for continued workup and treatment: SNF at D/C  Consultants:   Cardiology CHF Team  Psychiatry  Nephrology  Interventional Radiology    Procedures:  ECHOCARDIOGRAM ------------------------------------------------------------------- Study Conclusions  - Left ventricle: The cavity size was normal. Wall thickness was   increased in a pattern of mild LVH. Systolic function was   vigorous. The estimated ejection fraction was in the range of 65%   to 70%. Wall motion was normal; there were no regional wall   motion abnormalities. Doppler parameters are consistent with   abnormal left ventricular relaxation (grade 1 diastolic   dysfunction). Doppler parameters are consistent with high   ventricular filling pressure. - Aortic valve: There was mild stenosis. - Mitral valve: Calcified annulus. - Left atrium: The atrium was mildly dilated. - Right ventricle: The cavity size was mildly dilated. -  Right atrium: The atrium was mildly dilated. - Pulmonary arteries: Systolic pressure was moderately to severely   increased. PA peak pressure: 60 mm Hg (S).  Impressions:  - Vigorous LV systolic function; mild diastolic dysfunction with   elevated LV filling pressure; mild LVH;  elevated LVOT gradient   (mean gradient 17 mmHg) at least partially explained by vigorous   LV function; aortic valve not well visualized but appears to open   well; mild biatrial enlargement; mild RVE; mild TR with moderate   to severe pulmonary hypertension.  RIGHT HEART CATHETERIZATION 1. Moderate pulmonary venous hypertension.  2. Elevated R > L heart filling pressures 3. Preserved cardiac output.   Antimicrobials:  Anti-infectives (From admission, onward)   Start     Dose/Rate Route Frequency Ordered Stop   06/15/17 1900  azithromycin (ZITHROMAX) tablet 500 mg     500 mg Oral Daily 06/15/17 1853 06/20/17 0959     Subjective: Seen and examined at bedside and has not been eating. Was tearful today and saying she is "sick" and does not elaborated. States she is coughing up mucus still. Also complained of Abdominal Pain today and was agreeable for a Abdominal CT Scan.   Objective: Vitals:   06/16/17 1533 06/16/17 2039 06/17/17 0603 06/17/17 1158  BP: (!) 151/58 (!) 151/51 (!) 158/63 (!) 144/41  Pulse: 83 80 76 74  Resp: 17 16 16 17   Temp: 98.2 F (36.8 C) 98.5 F (36.9 C) 99.6 F (37.6 C) 99 F (37.2 C)  TempSrc: Oral Oral Oral Oral  SpO2: 96% 96% 100% 96%  Weight:   70.5 kg (155 lb 8 oz)   Height:        Intake/Output Summary (Last 24 hours) at 06/17/2017 1926 Last data filed at 06/17/2017 1500 Gross per 24 hour  Intake 717 ml  Output 550 ml  Net 167 ml   Filed Weights   06/15/17 0400 06/16/17 0606 06/17/17 0603  Weight: 75.9 kg (167 lb 5.3 oz) 74.2 kg (163 lb 9.3 oz) 70.5 kg (155 lb 8 oz)   Examination: Physical Exam:  Constitutional: WN/WD, Obese Caucasian female who remains uncomfortable and nauseous Eyes: Sclerae anicteric. Lids normal ENMT: External Ears and nose appear normal. MMM Neck: Supple with no JVD Respiratory: Diminished with unlabored breathing. No appreciable wheezing/rales/rhonchi Cardiovascular: RRR; S1/S2. Mild LE edema Abdomen: Soft, Tender  to palpate mid abdomen. Distended and has a mild fluid wave shift. Bowel sounds present GU: Deferred Musculoskeletal: No contractures; No cyanosis Skin: Wam and Dry, No rashes or lesions on a limited skin eval Neurologic: CN 2-12 grossly intact. No appreciable focal deficits Psychiatric: [place]drwan and severely depressed mood. Flat affect. Awake and alert  Data Reviewed: I have personally reviewed following labs and imaging studies  CBC: Recent Labs  Lab 06/13/17 1156 06/14/17 0806 06/15/17 0600 06/16/17 0536 06/17/17 0453  WBC 6.7 6.0 7.4 5.1 5.6  NEUTROABS  --  4.5 6.1 3.8 4.2  HGB 9.9* 9.2* 10.3* 9.7* 8.7*  HCT 32.2* 30.0* 33.3* 31.8* 27.1*  MCV 88.7 88.2 88.1 88.8 87.7  PLT 157 129* 138* 117* 622*   Basic Metabolic Panel: Recent Labs  Lab 06/13/17 0518 06/14/17 0448 06/14/17 0806 06/15/17 0600 06/16/17 0536 06/17/17 0453  NA 141 142  --  137 139 136  K 3.8 3.0*  --  3.8 3.5 3.1*  CL 104 104  --  102 103 100*  CO2 20* 25  --  24 23 25   GLUCOSE 294* 96  --  155* 251* 185*  BUN 28* 30*  --  31* 32* 33*  CREATININE 2.42* 2.24*  --  2.23* 2.34* 2.11*  CALCIUM 9.1 9.2  --  8.7* 8.8* 8.8*  MG  --  2.0  --  2.1 2.2 2.3  PHOS  --   --  3.9 3.7 3.9 3.1   GFR: Estimated Creatinine Clearance: 21.4 mL/min (A) (by C-G formula based on SCr of 2.11 mg/dL (H)). Liver Function Tests: Recent Labs  Lab 06/15/17 0600 06/16/17 0536 06/17/17 0453  AST 37 40 34  ALT 15 16 16   ALKPHOS 67 72 65  BILITOT 0.9 0.7 0.6  PROT 6.4* 6.2* 6.2*  ALBUMIN 2.8* 2.8* 2.7*   No results for input(s): LIPASE, AMYLASE in the last 168 hours. Recent Labs  Lab 06/16/17 0536 06/17/17 0453  AMMONIA 46* 51*   Coagulation Profile: Recent Labs  Lab 06/12/17 0523 06/16/17 0536 06/17/17 0453  INR 1.18 1.22 1.13   Cardiac Enzymes: Recent Labs  Lab 06/13/17 1100 06/13/17 1726 06/13/17 2316 06/14/17 0618 06/14/17 1115  TROPONINI 0.11* 0.39* 0.89* 1.08* 0.98*   BNP (last 3 results) No  results for input(s): PROBNP in the last 8760 hours. HbA1C: No results for input(s): HGBA1C in the last 72 hours. CBG: Recent Labs  Lab 06/16/17 1657 06/16/17 2118 06/17/17 0753 06/17/17 1156 06/17/17 1633  GLUCAP 338* 254* 173* 298* 311*   Lipid Profile: No results for input(s): CHOL, HDL, LDLCALC, TRIG, CHOLHDL, LDLDIRECT in the last 72 hours. Thyroid Function Tests: No results for input(s): TSH, T4TOTAL, FREET4, T3FREE, THYROIDAB in the last 72 hours. Anemia Panel: No results for input(s): VITAMINB12, FOLATE, FERRITIN, TIBC, IRON, RETICCTPCT in the last 72 hours. Sepsis Labs: No results for input(s): PROCALCITON, LATICACIDVEN in the last 168 hours.  Recent Results (from the past 240 hour(s))  Culture, blood (routine x 2)     Status: None (Preliminary result)   Collection Time: 06/13/17 12:05 PM  Result Value Ref Range Status   Specimen Description BLOOD LEFT HAND  Final   Special Requests IN PEDIATRIC BOTTLE Blood Culture adequate volume  Final   Culture   Final    NO GROWTH 4 DAYS Performed at [facility], [address] [place], [place] [address]    Report Status PENDING  Incomplete  Culture, blood (routine x 2)     Status: None (Preliminary result)   Collection Time: 06/13/17 12:10 PM  Result Value Ref Range Status   Specimen Description BLOOD ARM LEFT  Final   Special Requests IN PEDIATRIC BOTTLE Blood Culture adequate volume  Final   Culture   Final    NO GROWTH 4 DAYS Performed at [facility], [place] [address], [place], [place] [address]    Report Status PENDING  Incomplete  Respiratory Panel by PCR     Status: Abnormal   Collection Time: 06/15/17 10:59 AM  Result Value Ref Range Status   Adenovirus NOT DETECTED NOT DETECTED Final   Coronavirus 229E NOT DETECTED NOT DETECTED Final   Coronavirus HKU1 NOT DETECTED NOT DETECTED Final   Coronavirus NL63 NOT DETECTED NOT DETECTED Final   Coronavirus OC43 NOT DETECTED NOT DETECTED Final    Metapneumovirus NOT DETECTED NOT DETECTED Final   Rhinovirus / Enterovirus NOT DETECTED NOT DETECTED Final   Influenza A NOT DETECTED NOT DETECTED Final   Influenza B NOT DETECTED NOT DETECTED Final   Parainfluenza Virus 1 NOT DETECTED NOT DETECTED Final   Parainfluenza Virus 2 NOT DETECTED  NOT DETECTED Final   Parainfluenza Virus 3 NOT DETECTED NOT DETECTED Final   Parainfluenza Virus 4 NOT DETECTED NOT DETECTED Final   Respiratory Syncytial Virus DETECTED (A) NOT DETECTED Final    Comment: CRITICAL RESULT CALLED TO, READ BACK BY AND VERIFIED WITH: Clarene Essex RN 15:45 06/15/17 ([email or web address])    Bordetella pertussis NOT DETECTED NOT DETECTED Final   Chlamydophila pneumoniae NOT DETECTED NOT DETECTED Final   Mycoplasma pneumoniae NOT DETECTED NOT DETECTED Final    Radiology Studies: Ct Abdomen Pelvis Wo Contrast  Result Date: 06/17/2017 CLINICAL DATA:  Abdominal pain, nausea vomiting. EXAM: CT ABDOMEN AND PELVIS WITHOUT CONTRAST TECHNIQUE: Multidetector CT imaging of the abdomen and pelvis was performed following the standard protocol without IV contrast. COMPARISON:  Abdominal ultrasound 06/11/2017 FINDINGS: Lower chest: Calcific atherosclerotic disease of the coronary arteries and aorta. Hepatobiliary: Cirrhotic appearance of the liver. Moderate perihepatic water density ascites. The the gallbladder is decompressed and therefore not well evaluated. Pancreas: Atrophic pancreas. Spleen: Mild splenomegaly. Adrenals/Urinary Tract: Bilateral cortical thinning. Evaluation for renal masses precluded by lack of IV contrast. No evidence of hydronephrosis. Stomach/Bowel: Stomach is within normal limits. Post appendectomy. No evidence of bowel wall thickening, distention, or inflammatory changes. Vascular/Lymphatic: Aortic atherosclerosis. No enlarged abdominal or pelvic lymph nodes. Upper abdominal varices. Reproductive: Status post hysterectomy. No adnexal masses. Other: Moderate pelvic ascites. Musculoskeletal:  Age-indeterminate grade 1 compression fracture of superior endplate of L3 vertebral body. Schmorl's nodes versus mild compression fracture of T11 superior endplate in X73 superior endplate. 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 Interval development of abdominopelvic ascites. Atrophic appearance of the pancreas and kidneys. Age-indeterminate L3 vertebral body grade 1 superior endplate fracture. Electronically Signed   By: Fidela Salisbury M.D.   On: 06/17/2017 12:13   Dg Chest Port 1 View  Result Date: 06/16/2017 CLINICAL DATA:  Shortness of breath EXAM: PORTABLE CHEST 1 VIEW COMPARISON:  Two days ago FINDINGS: Stable borderline cardiomegaly. Stable mediastinal contours. There is no edema, consolidation, effusion, or pneumothorax. IMPRESSION: No evidence of active disease. Electronically Signed   By: Monte Fantasia M.D.   On: 06/16/2017 08:13   Scheduled Meds: . amLODipine  5 mg Oral BID  . aspirin EC  81 mg Oral Daily  . atorvastatin  80 mg Oral q1800  . azithromycin  500 mg Oral Daily  . calcitRIOL  0.5 mcg Oral Once per day on Mon Fri  . carvedilol  12.5 mg Oral BID WC  . cholecalciferol  1,000 Units Oral BID  . [START ON 06/18/2017] clonazePAM  0.5 mg Oral Daily  . docusate sodium  200 mg Oral QHS  . feeding supplement (ENSURE ENLIVE)  237 mL Oral TID BM  . ferrous sulfate  325 mg Oral BID WC  . furosemide  40 mg Oral Daily  . guaiFENesin  1,200 mg Oral BID  . heparin injection (subcutaneous)  5,000 Units Subcutaneous Q8H  . hydrALAZINE  75 mg Oral Q8H  . insulin aspart  0-15 Units Subcutaneous TID WC  . insulin aspart  3 Units Subcutaneous TID WC  . insulin detemir  20 Units Subcutaneous BID  . isosorbide mononitrate  30 mg Oral Daily  . lactulose  20 g Oral BID  . potassium chloride  40 mEq Oral BID  . [START ON 06/18/2017] sertraline  150 mg Oral Daily  . sodium chloride flush  3 mL Intravenous Q12H  . sodium chloride flush  3 mL Intravenous  Q12H  . traZODone  100  mg Oral QHS  . trimethoprim-polymyxin b  2 drop Both Eyes BID   Continuous Infusions: . sodium chloride 250 mL (06/14/17 1242)    LOS: 7 days   Kerney Elbe, DO [facility] Pager [phone]  If 7PM-7AM, please contact night-coverage [email or web address] Password [facility] 06/17/2017, 7:26 PM

## 2017-06-18 DIAGNOSIS — K7460 Unspecified cirrhosis of liver: Secondary | ICD-10-CM

## 2017-06-18 DIAGNOSIS — R188 Other ascites: Secondary | ICD-10-CM

## 2017-06-18 LAB — CBC WITH DIFFERENTIAL/PLATELET
BASOS ABS: 0 10*3/uL (ref 0.0–0.1)
BASOS PCT: 0 %
EOS ABS: 0.3 10*3/uL (ref 0.0–0.7)
Eosinophils Relative: 6 %
HEMATOCRIT: 29.5 % — AB (ref 36.0–46.0)
HEMOGLOBIN: 9.4 g/dL — AB (ref 12.0–15.0)
Lymphocytes Relative: 17 %
Lymphs Abs: 0.9 10*3/uL (ref 0.7–4.0)
MCH: 28.1 pg (ref 26.0–34.0)
MCHC: 31.9 g/dL (ref 30.0–36.0)
MCV: 88.1 fL (ref 78.0–100.0)
MONOS PCT: 7 %
Monocytes Absolute: 0.3 10*3/uL (ref 0.1–1.0)
NEUTROS ABS: 3.6 10*3/uL (ref 1.7–7.7)
NEUTROS PCT: 70 %
Platelets: 110 10*3/uL — ABNORMAL LOW (ref 150–400)
RBC: 3.35 MIL/uL — ABNORMAL LOW (ref 3.87–5.11)
RDW: 15.8 % — AB (ref 11.5–15.5)
WBC: 5.1 10*3/uL (ref 4.0–10.5)

## 2017-06-18 LAB — COMPREHENSIVE METABOLIC PANEL
ALBUMIN: 2.6 g/dL — AB (ref 3.5–5.0)
ALK PHOS: 77 U/L (ref 38–126)
ALT: 19 U/L (ref 14–54)
ANION GAP: 10 (ref 5–15)
AST: 42 U/L — AB (ref 15–41)
BILIRUBIN TOTAL: 0.8 mg/dL (ref 0.3–1.2)
BUN: 32 mg/dL — AB (ref 6–20)
CO2: 24 mmol/L (ref 22–32)
Calcium: 8.8 mg/dL — ABNORMAL LOW (ref 8.9–10.3)
Chloride: 100 mmol/L — ABNORMAL LOW (ref 101–111)
Creatinine, Ser: 2.07 mg/dL — ABNORMAL HIGH (ref 0.44–1.00)
GFR calc Af Amer: 27 mL/min — ABNORMAL LOW (ref >60)
GFR calc non Af Amer: 23 mL/min — ABNORMAL LOW (ref >60)
GLUCOSE: 241 mg/dL — AB (ref 65–99)
POTASSIUM: 4.1 mmol/L (ref 3.5–5.1)
SODIUM: 134 mmol/L — AB (ref 135–145)
Total Protein: 5.9 g/dL — ABNORMAL LOW (ref 6.5–8.1)

## 2017-06-18 LAB — CULTURE, BLOOD (ROUTINE X 2)
CULTURE: NO GROWTH
Culture: NO GROWTH
SPECIAL REQUESTS: ADEQUATE
SPECIAL REQUESTS: ADEQUATE

## 2017-06-18 LAB — MAGNESIUM: Magnesium: 2.3 mg/dL (ref 1.7–2.4)

## 2017-06-18 LAB — PROTIME-INR
INR: 1.15
PROTHROMBIN TIME: 14.6 s (ref 11.4–15.2)

## 2017-06-18 LAB — PHOSPHORUS: Phosphorus: 3 mg/dL (ref 2.5–4.6)

## 2017-06-18 LAB — GLUCOSE, CAPILLARY
GLUCOSE-CAPILLARY: 186 mg/dL — AB (ref 65–99)
GLUCOSE-CAPILLARY: 183 mg/dL — AB (ref 65–99)
Glucose-Capillary: 211 mg/dL — ABNORMAL HIGH (ref 65–99)
Glucose-Capillary: 168 mg/dL — ABNORMAL HIGH (ref 65–99)

## 2017-06-18 LAB — AMMONIA: AMMONIA: 76 umol/L — AB (ref 9–35)

## 2017-06-18 MED ORDER — LACTULOSE 10 GM/15ML PO SOLN
30.0000 g | Freq: Two times a day (BID) | ORAL | Status: DC
  Administered 2017-06-19: 30 g via ORAL
  Filled 2017-06-18 (×2): qty 45

## 2017-06-18 MED ORDER — CARVEDILOL 25 MG PO TABS
25.0000 mg | ORAL_TABLET | Freq: Two times a day (BID) | ORAL | Status: DC
  Administered 2017-06-18 – 2017-06-19 (×2): 25 mg via ORAL
  Filled 2017-06-18 (×3): qty 1

## 2017-06-18 MED ORDER — OXYCODONE HCL 5 MG PO TABS
5.0000 mg | ORAL_TABLET | Freq: Once | ORAL | Status: AC
  Administered 2017-06-18: 5 mg via ORAL
  Filled 2017-06-18: qty 1

## 2017-06-18 NOTE — Consult Note (Signed)
Consultation  Referring Provider: [facility]/Nettey MD Primary Care Physician:  Jacqualine Code, DO Primary Gastroenterologist:  none.  Reason for Consultation:  Abdominal  pain  HPI: EURA MCCAUSLIN is a 72 y.o. female who was admitted on 06/10/2017 with shortness of breath and progressive edema. She is being treated for an acute exacerbation of congestive heart failure. Patient has history of severe pumonary hypertension., Chronic kidney disease stage IV, hypertension, major depression, lupus, RA. She was also diagnosed with an RSV infection since admission and has had coughing congestion. Patient says that she had some abdominal discomfort a couple of days ago and had nausea and vomiting about 2 nights ago. She says her abdominal discomfort has resolved and she is no longer nauseated. She still has congestion but feels overall she has improved. Patient developed SVT on 06/14/2017. Patient has had an elevated troponin. She was to undergo cardiac cath yesterday but because of elevated creatinine this was not done. Patient had upper abdominal ultrasound done because of complaints of abdominal discomfort which did show a nodular appearing liver consistent with cirrhosis, no gallstones no gallbladder wall thickening and CBD of 2 mm. CT of the abdomen and pelvis was ordered yesterday again consistent with cirrhosis. She did not have any IV contrast. There was moderate ascites noted an atrophic pancreas mild splenomegaly and extensive abdominal varices. Patient states that she has had prior GI workup with Dr. Lindalou Hose in [place]  for screening colonoscopies which have been negative. She is also aware of diagnosis of cirrhosis and says she was told several years ago by her primary care physician that she had cirrhosis. She believes she had blood work done but does not remember being seen by GI. She has no history of EtOH use or abuse, no history of hepatitis, no family history of  cirrhosis. Labs today WBC of 5.1 hemoglobin 9.4 hematocrit of 29.5 platelets 110 creatinine of 2 liver function studies within normal limits, INR of 1.15 venous ammonia was 76.   Past Medical History:  Diagnosis Date  . Anxiety   . Bradycardia   . CHF (congestive heart failure) ([place])   . Chronic kidney disease    CKD Stage 3  . Depression   . Diabetes mellitus without complication ([place])   . Hyperkalemia   . Hypertension   . Lupus   . Peripheral neuropathy   . Rheumatoid arthritis ([place])   . Seizures ([place])   . Shortness of breath dyspnea     Past Surgical History:  Procedure Laterality Date  . ABDOMINAL HYSTERECTOMY    . APPENDECTOMY    . CARDIAC CATHETERIZATION N/A 05/18/2016   Procedure: Right Heart Cath;  Surgeon: Burnell Blanks, MD;  Location: [facility];  Service: Cardiovascular;  Laterality: N/A;  . CYST EXCISION    . DENTAL SURGERY  01/18/15  . RIGHT HEART CATH N/A 06/12/2017   Procedure: RIGHT HEART CATH;  Surgeon: Larey Dresser, MD;  Location: [facility];  Service: Cardiovascular;  Laterality: N/A;    Prior to Admission medications   Medication Sig Start Date End Date Taking? Authorizing Provider  amLODipine (NORVASC) 5 MG tablet Take 5 mg by mouth 2 (two) times daily.    Yes [provider]  aspirin EC 81 MG tablet Take 81 mg by mouth daily.   Yes [provider]  calcitRIOL (ROCALTROL) 0.5 MCG capsule Take 0.5 mcg by mouth See admin instructions. Take 1 capsule  0.6mcg every Monday and Friday  Yes [provider]  cholecalciferol (VITAMIN D) 1000 units tablet Take 1,000 Units by mouth 2 (two) times daily.   Yes [provider]  cloNIDine (CATAPRES) 0.2 MG tablet Take 0.2 mg by mouth 4 (four) times daily as needed.    Yes [provider]  docusate sodium (COLACE) 100 MG capsule Take 1 capsule (100 mg total) by mouth 2 (two) times daily. Patient taking differently: Take 200 mg by mouth at bedtime.   01/28/15  Yes Donne Hazel, MD  ferrous sulfate 325 (65 FE) MG tablet Take 325 mg by mouth 2 (two) times daily with a meal.    Yes [provider]  furosemide (LASIX) 20 MG tablet Take 20 mg by mouth daily.    Yes [provider]  gabapentin (NEURONTIN) 100 MG capsule Take 100 mg by mouth 3 (three) times daily.   Yes [provider]  hydrALAZINE (APRESOLINE) 10 MG tablet Take 10 mg by mouth 2 (two) times daily.   Yes [provider]  insulin aspart (NOVOLOG FLEXPEN) 100 UNIT/ML FlexPen Inject 5 Units into the skin 3 (three) times daily with meals.   Yes [provider]  insulin detemir (LEVEMIR) 100 UNIT/ML injection Inject 20-30 Units into the skin See admin instructions. Take 20-30 units twice daily depending on blood sugar reading   Yes [provider]  ipratropium-albuterol (DUONEB) 0.5-2.5 (3) MG/3ML SOLN Take 3 mLs by nebulization every 6 (six) hours as needed (for shortness of breath).    Yes [provider]  ondansetron (ZOFRAN-ODT) 8 MG disintegrating tablet Take 8 mg by mouth at bedtime.  11/14/14  Yes [provider]  oxyCODONE (OXY IR/ROXICODONE) 5 MG immediate release tablet Take 5 mg by mouth every 6 (six) hours as needed for severe pain.   Yes [provider]  Polymyxin B-Trimethoprim (POLYTRIM OP) Place 2 drops into both eyes 2 (two) times daily.   Yes [provider]  pravastatin (PRAVACHOL) 80 MG tablet Take 80 mg by mouth at bedtime.   Yes [provider]  sertraline (ZOLOFT) 100 MG tablet Take 100 mg by mouth daily.    Yes [provider]  torsemide (DEMADEX) 5 MG tablet Take 5 mg by mouth daily.   Yes [provider]  traZODone (DESYREL) 50 MG tablet Take 50 mg by mouth at bedtime.   Yes [provider]    Current Facility-Administered Medications  Medication Dose Route Frequency Provider Last Rate Last Dose  . 0.9 %  sodium chloride infusion  250 mL  Intravenous PRN Larey Dresser, MD 5 mL/hr at 06/14/17 1242 250 mL at 06/14/17 1242  . acetaminophen (TYLENOL) tablet 650 mg  650 mg Oral Q6H PRN Lady Deutscher, MD   650 mg at 06/18/17 0221   Or  . acetaminophen (TYLENOL) suppository 650 mg  650 mg Rectal Q6H PRN Lady Deutscher, MD      . acetaminophen (TYLENOL) tablet 650 mg  650 mg Oral Q4H PRN Larey Dresser, MD   650 mg at 06/16/17 1437  . amLODipine (NORVASC) tablet 5 mg  5 mg Oral BID Lady Deutscher, MD   5 mg at 06/18/17 0846  . aspirin EC tablet 81 mg  81 mg Oral Daily Lady Deutscher, MD   81 mg at 06/18/17 0845  . atorvastatin (LIPITOR) tablet 80 mg  80 mg Oral q1800 Larey Dresser, MD   80 mg at 06/17/17 1638  . azithromycin [facility]) tablet 500  mg  500 mg Oral Daily Raiford Noble [place], DO   500 mg at 06/18/17 6387  . calcitRIOL (ROCALTROL) capsule 0.5 mcg  0.5 mcg Oral Once per day on Mon Fri Lady Deutscher, MD   0.5 mcg at 06/16/17 1115  . carvedilol (COREG) tablet 12.5 mg  12.5 mg Oral BID WC Clegg, Amy D, NP   12.5 mg at 06/18/17 5643  . cholecalciferol (VITAMIN D) tablet 1,000 Units  1,000 Units Oral BID Lady Deutscher, MD   1,000 Units at 06/18/17 [phone]  . clonazePAM (KLONOPIN) tablet 0.5 mg  0.5 mg Oral Daily Akintayo, Mojeed, MD   0.5 mg at 06/18/17 0846  . cloNIDine (CATAPRES) tablet 0.2 mg  0.2 mg Oral QID PRN Lady Deutscher, MD   0.2 mg at 06/14/17 0537  . diclofenac sodium (VOLTAREN) 1 % transdermal gel 2 g  2 g Topical QID PRN Raiford Noble Latif, DO   2 g at 06/17/17 1454  . docusate sodium (COLACE) capsule 200 mg  200 mg Oral QHS Lady Deutscher, MD   200 mg at 06/16/17 2220  . feeding supplement (ENSURE ENLIVE) (ENSURE ENLIVE) liquid 237 mL  237 mL Oral TID BM Raiford Noble [place], DO   237 mL at 06/17/17 2104  . ferrous sulfate tablet 325 mg  325 mg Oral BID WC Lady Deutscher, MD   325 mg at 06/18/17 0845  . furosemide (LASIX) tablet 40 mg  40 mg Oral Daily Larey Dresser, MD    40 mg at 06/18/17 0846  . guaiFENesin (MUCINEX) 12 hr tablet 1,200 mg  1,200 mg Oral BID Raiford Noble [place], DO   1,200 mg at 06/18/17 0845  . heparin injection 5,000 Units  5,000 Units Subcutaneous Q8H Larey Dresser, MD   5,000 Units at 06/18/17 1884  . hydrALAZINE (APRESOLINE) tablet 75 mg  75 mg Oral [address] [place], DO   75 mg at 06/18/17 1660  . insulin aspart (novoLOG) injection 0-15 Units  0-15 Units Subcutaneous TID WC Lady Deutscher, MD   5 Units at 06/18/17 [phone]  . insulin aspart (novoLOG) injection 3 Units  3 Units Subcutaneous TID WC Lady Deutscher, MD   3 Units at 06/18/17 [phone]  . insulin detemir (LEVEMIR) injection 20 Units  20 Units Subcutaneous BID Lady Deutscher, MD   20 Units at 06/18/17 [phone]  . ipratropium-albuterol (DUONEB) 0.5-2.5 (3) MG/3ML nebulizer solution 3 mL  3 mL Nebulization Q6H PRN Lady Deutscher, MD      . isosorbide mononitrate (IMDUR) 24 hr tablet 30 mg  30 mg Oral Daily Larey Dresser, MD   30 mg at 06/18/17 0845  . lactulose (CHRONULAC) 10 GM/15ML solution 20 g  20 g Oral BID Raiford Noble [place], DO   20 g at 06/18/17 0845  . menthol-cetylpyridinium (CEPACOL) lozenge 3 mg  1 lozenge Oral PRN Blount, Scarlette Shorts T, NP      . ondansetron (ZOFRAN) injection 4 mg  4 mg Intravenous Q6H PRN Larey Dresser, MD   4 mg at 06/15/17 0231  . ondansetron (ZOFRAN) tablet 4 mg  4 mg Oral Q8H PRN Raiford Noble Latif, DO      . oxyCODONE (Oxy IR/ROXICODONE) immediate release tablet 5 mg  5 mg Oral Q6H PRN Lady Deutscher, MD   5 mg at 06/18/17 0028  . promethazine (PHENERGAN) injection 12.5 mg  12.5 mg Intravenous Q6H PRN Sheikh, Omair Latif, DO   12.5 mg  at 06/15/17 1049  . sertraline (ZOLOFT) tablet 150 mg  150 mg Oral Daily Akintayo, Mojeed, MD   150 mg at 06/18/17 0845  . sodium chloride (OCEAN) 0.65 % nasal spray 1 spray  1 spray Each Nare PRN Raiford Noble Latif, DO   1 spray at 06/16/17 1701  . sodium chloride flush (NS) 0.9 % injection 3 mL  3  mL Intravenous Q12H Lady Deutscher, MD   3 mL at 06/17/17 2105  . sodium chloride flush (NS) 0.9 % injection 3 mL  3 mL Intravenous Q12H Larey Dresser, MD   3 mL at 06/17/17 0818  . sodium chloride flush (NS) 0.9 % injection 3 mL  3 mL Intravenous PRN Larey Dresser, MD   3 mL at 06/13/17 2105  . traZODone (DESYREL) tablet 100 mg  100 mg Oral QHS Akintayo, Mojeed, MD   100 mg at 06/17/17 2101  . trimethoprim-polymyxin b (POLYTRIM) ophthalmic solution 2 drop  2 drop Both Eyes BID Lady Deutscher, MD   2 drop at 06/17/17 2219    Allergies as of 06/10/2017 - Review Complete 06/10/2017  Allergen Reaction Noted  . Phenytoin sodium extended Itching 08/07/2015  . Latex Swelling 01/22/2015    Family History  Problem Relation Age of Onset  . Heart failure Mother   . Liver cancer Father   . Lung cancer Father   . Cancer Sister   . Heart murmur Brother     Social History   Socioeconomic History  . Marital status: Widowed    Spouse name: Not on file  . Number of children: Not on file  . Years of education: Not on file  . Highest education level: Not on file  Social Needs  . Financial resource strain: Not on file  . Food insecurity - worry: Not on file  . Food insecurity - inability: Not on file  . Transportation needs - medical: Not on file  . Transportation needs - non-medical: Not on file  Occupational History  . Not on file  Tobacco Use  . Smoking status: Never Smoker  . Smokeless tobacco: Never Used  Substance and Sexual Activity  . Alcohol use: No    Alcohol/week: 0.0 oz  . Drug use: No  . Sexual activity: Not on file  Other Topics Concern  . Not on file  Social History Narrative  . Not on file    Review of Systems: Pertinent positive and negative review of systems were noted in the above HPI section.  All other review of systems was otherwise negative.  Physical Exam: Vital signs in last 24 hours: Temp:  [99 F (37.2 C)-99.6 F (37.6 C)] 99.1 F (37.3  C) (02/03 0512) Pulse Rate:  [66-75] 66 (02/03 0512) Resp:  [17-18] 18 (02/03 0512) BP: (137-149)/(41-48) 137/46 (02/03 0512) SpO2:  [96 %] 96 % (02/03 0512) Weight:  [159 lb (72.1 kg)] 159 lb (72.1 kg) (02/03 0512) Last BM Date: 06/17/17 General:   Alert,  Well-developed, chronically ill-appearing well-nourished, pleasant and cooperative in NAD Head:  Normocephalic and atraumatic. Eyes:  Sclera clear, no icterus.   Conjunctiva pink. Ears:  Normal auditory acuity. Nose:  No deformity, discharge,  or lesions. Mouth:  No deformity or lesions.   Neck:  Supple; no masses or thyromegaly. Lungs:  Scattered rhonchi bilaterally  Heart:  Regular rate and rhythm; systolic murmur Abdomen:  Soft,nontender, BS active,nonpalp mass or hsm. Positive fluid wave non-tense  Rectal:  Deferred  Msk:  Symmetrical without  gross deformities. . Pulses:  Normal pulses noted. Extremities:  Without clubbing or edema. Neurologic:  Alert and  oriented x4;  grossly normal neurologically. Skin:  Intact without significant lesions or rashes.. Psych:  Alert and cooperative. Normal mood and affect.   Lab Results: Recent Labs    06/16/17 0536 06/17/17 0453 06/18/17 0820  WBC 5.1 5.6 5.1  HGB 9.7* 8.7* 9.4*  HCT 31.8* 27.1* 29.5*  PLT 117* 112* 110*   BMET Recent Labs    06/16/17 0536 06/17/17 0453 06/18/17 0820  NA 139 136 134*  K 3.5 3.1* 4.1  CL 103 100* 100*  CO2 23 25 24   GLUCOSE 251* 185* 241*  BUN 32* 33* 32*  CREATININE 2.34* 2.11* 2.07*  CALCIUM 8.8* 8.8* 8.8*   LFT Recent Labs    06/18/17 0820  PROT 5.9*  ALBUMIN 2.6*  AST 42*  ALT 19  ALKPHOS 77  BILITOT 0.8   PT/INR Recent Labs    06/17/17 0453 06/18/17 0820  LABPROT 14.4 14.6  INR 1.13 1.15       IMPRESSION:  #35 72 year old white female admitted 06/10/2017 with shortness of breath and edema consistent with acute exacerbation of congestive heart failure Patient has severe pulmonary hypertension Cath was planned  for yesterday but on hold secondary to elevated creatinine  #2 chronic kidney disease stage 3-4 #3 cirrhosis, this is not a new diagnosis for the patient, there is some evidence for decompensation with intra-abdominal varices, ascites, thrombocytopenia.  Etiology of cirrhosis is not clear, R/O nonalcoholic fatty liver disease. Patient also has history of lupus, consider autoimmune Consider cardiogenic -most likely  #4 abdominal   Pain, nausea and vomiting resolved-patient diagnosed with RSV virus this admission which can be associated with GI and respiratory symptoms  #5 anemia chronic #6 hx Lupus #7 Hx RA   PLAN: #1 Do not think patient needs further workup for her transient abdominal pain and vomiting in setting of documented positive RSV, her symptoms have resolved  #2 we will send off labs for chronic viral hepatitis, autoimmune markers and markers for an inheritable forms of liver disease, check AFP   #3 paracentesis had been ordered, I don't think she needs therapeutic  paracentesis, and diagnostic will be hard to interpret given CHF, diuretics etc- will cancel. #4 she should have EGD at some point to screen for esophageal varices, this does not need to be done while inpatient, and she can be followed at our office, outpatient for management of cirrhosis.  #5 In setting of severe pulm HTN/CHF  Diuretics should be directed and managed by cardiology.    Amy Esterwood  06/18/2017, 10:25 AM  ________________________________________________________________________  [facility] MD note:  I personally examined the patient, reviewed the data and agree with the assessment and plan described above.  She has known about her cirrhosis for about 4-5 years.  Unclear etiology but cardiogenic cirrhosis leads the ddx. She has never really abused etoh. Will check a battery of blood tests to check for viral, autoimmune, other potential causes.  Her ascites and edema is at least partly due to her  cirrhosis. The edema is much better since she's been in hosp, getting diuresed.  Currently only on lasix 40mg  once daily, orally.  I do not recommend any changes for now and actually recommend that only one care team manage her diuresis to avoid therapeutic misadventures that often occur when more than one care team is adjusting diuretics in out patient setting. I will leave diuretic management  to her cardiologist/CHF team.  I don't think paracentesis will help here, very low suspicion for SBP, underlying malignancy.  We have cancelled it.  She knows to eat a low salt diet and absolutely avoid NSAIDs to keep edema from returning.  [facility] will arrange for outpatient follow up in 5-6 weeks (with me). At that time will consider upper endoscopy to screen for UGI varices.   Her liver care does not require in patient testing, OK for d/c from my perspective anytime.   Owens Loffler, MD [facility] Pager [phone]

## 2017-06-18 NOTE — Progress Notes (Signed)
Pt verbalizes desire to be discharged to home tomorrow. Explained to patient that it will be important for her to attempt solid foods before discharge will likely be considered. Pt states she is drinking Glucerna's. Patient states that at this time she has an aversion to food/food smells. (unknown etiology)., So she has been relying on Glucerna supplements for nutrition needs.  Explained to patient that solid nutrition needs to be consumed prior to d/c to make sure she is tolerating. And that Glucerna  Should not be considered her main source of nutrition. In addition to maintaining the functionality of her GI tract.  Patient states she will attempt a few bites from her bkfst tray this am.

## 2017-06-18 NOTE — Progress Notes (Signed)
Order entered as IR rad eval instead of paracentesis.  I spoke with Ultrasound tech to let her know that I was changing the order to and US paracentesis.  The info for max amount to be removed, albumin, and labs was not entered by MD.  I informed the [place] tech that she would need to find out the appropriate info.

## 2017-06-18 NOTE — Progress Notes (Signed)
Progress Note  Patient Name: Stacy Smith Date of Encounter: 06/18/2017  Primary Cardiologist: Carlyle Dolly, MD   Subjective   Feeling much better.  Cough improving.  Mood is much better today.  Inpatient Medications    Scheduled Meds: . amLODipine  5 mg Oral BID  . aspirin EC  81 mg Oral Daily  . atorvastatin  80 mg Oral q1800  . azithromycin  500 mg Oral Daily  . calcitRIOL  0.5 mcg Oral Once per day on Mon Fri  . carvedilol  12.5 mg Oral BID WC  . cholecalciferol  1,000 Units Oral BID  . clonazePAM  0.5 mg Oral Daily  . docusate sodium  200 mg Oral QHS  . feeding supplement (ENSURE ENLIVE)  237 mL Oral TID BM  . ferrous sulfate  325 mg Oral BID WC  . furosemide  40 mg Oral Daily  . guaiFENesin  1,200 mg Oral BID  . heparin injection (subcutaneous)  5,000 Units Subcutaneous Q8H  . hydrALAZINE  75 mg Oral Q8H  . insulin aspart  0-15 Units Subcutaneous TID WC  . insulin aspart  3 Units Subcutaneous TID WC  . insulin detemir  20 Units Subcutaneous BID  . isosorbide mononitrate  30 mg Oral Daily  . lactulose  20 g Oral BID  . sertraline  150 mg Oral Daily  . sodium chloride flush  3 mL Intravenous Q12H  . sodium chloride flush  3 mL Intravenous Q12H  . traZODone  100 mg Oral QHS  . trimethoprim-polymyxin b  2 drop Both Eyes BID   Continuous Infusions: . sodium chloride 250 mL (06/14/17 1242)   PRN Meds: sodium chloride, acetaminophen **OR** acetaminophen, acetaminophen, cloNIDine, diclofenac sodium, ipratropium-albuterol, menthol-cetylpyridinium, ondansetron (ZOFRAN) IV, ondansetron, oxyCODONE, promethazine, sodium chloride, sodium chloride flush   Vital Signs    Vitals:   06/17/17 0603 06/17/17 1158 06/17/17 2006 06/18/17 0512  BP: (!) 158/63 (!) 144/41 (!) 149/48 (!) 137/46  Pulse: 76 74 75 66  Resp: 16 17 18 18   Temp: 99.6 F (37.6 C) 99 F (37.2 C) 99.6 F (37.6 C) 99.1 F (37.3 C)  TempSrc: Oral Oral Oral Oral  SpO2: 100% 96% 96% 96%  Weight:  155 lb 8 oz (70.5 kg)   159 lb (72.1 kg)  Height:        Intake/Output Summary (Last 24 hours) at 06/18/2017 1031 Last data filed at 06/18/2017 2876 Gross per 24 hour  Intake 600 ml  Output 100 ml  Net 500 ml   Filed Weights   06/16/17 0606 06/17/17 0603 06/18/17 0512  Weight: 163 lb 9.3 oz (74.2 kg) 155 lb 8 oz (70.5 kg) 159 lb (72.1 kg)    Telemetry    Sinus rhythm.  No events- Personally Reviewed  ECG    n/a - Personally Reviewed  Physical Exam   VS:  BP (!) 137/46 (BP Location: Left Arm)   Pulse 66   Temp 99.1 F (37.3 C) (Oral)   Resp 18   Ht 5' (1.524 m)   Wt 159 lb (72.1 kg)   SpO2 96%   BMI 31.05 kg/m  , BMI Body mass index is 31.05 kg/m. GENERAL:  Ill-appearing.   No acute distress. HEENT: Pupils equal round and reactive, fundi not visualized, oral mucosa unremarkable NECK: JVD to 1cm above clavicle at 45 degrees, waveform within normal limits, carotid upstroke brisk and symmetric, no bruits LUNGS: Mild diffuse expiratory wheezing.  No crackles or rhonchi. HEART:  RRR.  PMI not displaced or sustained,S1 and S2 within normal limits, no S3, no S4, no clicks, no rubs, no murmurs ABD:  Flat, positive bowel sounds normal in frequency in pitch, no bruits, no rebound, no guarding, no midline pulsatile mass, no hepatomegaly, no splenomegaly EXT:  2 plus pulses throughout, no edema, no cyanosis no clubbing SKIN:  No rashes no nodules NEURO:  Cranial nerves II through XII grossly intact, motor grossly intact throughout [facility]:  Cognitively intact, oriented to person place and time  Labs    Chemistry Recent Labs  Lab 06/16/17 0536 06/17/17 0453 06/18/17 0820  NA 139 136 134*  K 3.5 3.1* 4.1  CL 103 100* 100*  CO2 23 25 24   GLUCOSE 251* 185* 241*  BUN 32* 33* 32*  CREATININE 2.34* 2.11* 2.07*  CALCIUM 8.8* 8.8* 8.8*  PROT 6.2* 6.2* 5.9*  ALBUMIN 2.8* 2.7* 2.6*  AST 40 34 42*  ALT 16 16 19   ALKPHOS 72 65 77  BILITOT 0.7 0.6 0.8  GFRNONAA 20* 22* 23*    GFRAA 23* 26* 27*  ANIONGAP 13 11 10      Hematology Recent Labs  Lab 06/16/17 0536 06/17/17 0453 06/18/17 0820  WBC 5.1 5.6 5.1  RBC 3.58* 3.09* 3.35*  HGB 9.7* 8.7* 9.4*  HCT 31.8* 27.1* 29.5*  MCV 88.8 87.7 88.1  MCH 27.1 28.2 28.1  MCHC 30.5 32.1 31.9  RDW 16.6* 16.0* 15.8*  PLT 117* 112* 110*    Cardiac Enzymes Recent Labs  Lab 06/13/17 1726 06/13/17 2316 06/14/17 0618 06/14/17 1115  TROPONINI 0.39* 0.89* 1.08* 0.98*    No results for input(s): TROPIPOC in the last 168 hours.   BNP No results for input(s): BNP, PROBNP in the last 168 hours.   DDimer No results for input(s): DDIMER in the last 168 hours.   Radiology    Ct Abdomen Pelvis Wo Contrast  Result Date: 06/17/2017 CLINICAL DATA:  Abdominal pain, nausea vomiting. EXAM: CT ABDOMEN AND PELVIS WITHOUT CONTRAST TECHNIQUE: Multidetector CT imaging of the abdomen and pelvis was performed following the standard protocol without IV contrast. COMPARISON:  Abdominal ultrasound 06/11/2017 FINDINGS: Lower chest: Calcific atherosclerotic disease of the coronary arteries and aorta. Hepatobiliary: Cirrhotic appearance of the liver. Moderate perihepatic water density ascites. The the gallbladder is decompressed and therefore not well evaluated. Pancreas: Atrophic pancreas. Spleen: Mild splenomegaly. Adrenals/Urinary Tract: Bilateral cortical thinning. Evaluation for renal masses precluded by lack of IV contrast. No evidence of hydronephrosis. Stomach/Bowel: Stomach is within normal limits. Post appendectomy. No evidence of bowel wall thickening, distention, or inflammatory changes. Vascular/Lymphatic: Aortic atherosclerosis. No enlarged abdominal or pelvic lymph nodes. Upper abdominal varices. Reproductive: Status post hysterectomy. No adnexal masses. Other: Moderate pelvic ascites. Musculoskeletal: Age-indeterminate grade 1 compression fracture of superior endplate of L3 vertebral body. Schmorl's nodes versus mild compression  fracture of T11 superior endplate in I34 superior endplate. 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 Interval development of abdominopelvic ascites. Atrophic appearance of the pancreas and kidneys. Age-indeterminate L3 vertebral body grade 1 superior endplate fracture. Electronically Signed   By: Fidela Salisbury M.D.   On: 06/17/2017 12:13    Cardiac Studies    RHC (1/18): mean RA 8, PA 78/25 mean 45, mean PCWP 18, CI 3.6, PVR 4.6 WU Echo (9/18): EF 60-65%, normal RV systolic function, PASP 58, mild TR and MR.  Echo (1/19): EF 65-70%, mild aortic stenosis, mildly dilated RV normal systolic function, PASP 60 mmHg.  V/Q scan (1/19): No PE Abdominal US (  1/19): Liver appears cirrhotic. No gallstones  RHC Procedural Findings (06/12/17): Hemodynamics (mmHg) RA mean 13 RV 65/15 PA 61/27, mean 39 PCWP mean 20 Oxygen saturations: PA 80% AO 97% Cardiac Output (Fick) 10.1  Cardiac Index (Fick) 5.87 PVR 1.9 WU Cardiac Output (Thermo) 9.23 Cardiac Output (Thermo) 5.34 PVR 2 WU   Patient Profile     72 y.o. female WITH severe pulmonary hypertension, CKD IV, hypertension, and possible cirrhosis admitted with acute on chronic diastolic heart failure.  Assessment & Plan    # Acute on chronic diastolic heart failure: # Severe pulmonary hypertension:  PA 61/27, mean 39: PVR 2 on this admission.  Therefore she was not started on any PAH treatments.  Echo with mildly dilated RV.  No evidence of chronic PE.  Continue diuresis.  Lasix was switched to oral 06/17/17.  Only one 100 mL's of urine output is recorded.  Her weight is very difficult to track as it has changed from 163 pounds to 155 pounds yesterday to 159 pounds today.  Overall her volume status seems stable.  Renal function continues to improve slowly.  Her weight is inaccurate as it went from 163 pounds yesterday to 155 pounds today.  Continue Lasix 40 mg daily.  # CKD III-IV:  Creatinine slightly  better today at 2.07.  Continue Lasix.  # Hypertension: Carvedilol was increased 2/1.  Blood pressure remains slightly above goal.  Continue amlodipine 5 mg twice daily and increase carvedilol to 25 mg twice daily.   # Cirrhosis: RUQ suggestive of cirrhosis.  CT abdomen/pelvis on 2/2 revealed cirrhotic changes of the liver with splenomegaly and extensive abdominal varices.  Pancreas and kidneys appeared atrophic.  Workup per IM.  Continue Lasix.  # RSV: Positive on respiratory viral panel.  Symptoms better today.  # Elevated troponin: Patient wasn't cathed 2/2 acute on chronic renal failure and RSV infection. She has no chest pain or ischemic changes on EKG. given her findings of extensive abdominal varices it seems that the risk of catheterization and possible need for antiplatelet therapy would outweigh the benefit.   For questions or updates, please contact [place] Please consult [email or web address] for contact info under Cardiology/STEMI.      Signed, Skeet Latch, MD  06/18/2017, 10:31 AM

## 2017-06-18 NOTE — Progress Notes (Signed)
PROGRESS NOTE    Stacy Smith  CVE:[id number] DOB: [birth year] DOA: 06/10/2017 PCP: Jacqualine Code, DO   Brief Narrative:  [name] is an 72 y.o. female past medical history significant for pulmonary hypertension, with a right heart cath in January 2018 that showed pressure of 74,, chronic diastolic heart failure ,diabetes mellitus with chronic renal disease stage III and other comorbids who presents to the ED with dyspnea and lower extremity edema. Found to have an Acute Decompensation of Diastolic CHF and Pulmonary HTN. She was found to have an Elevated Troponin and ?CP so Cardiology started her on Heparin gtt but has not stopped. Patient now RSV + and plan was to go to Cath this afternoon if Cr is improve, however did not and so Cardiology did not Cath the patient. Patient continued to Complain of Abdominal Pain so CT Abdomen was ordered to further evaluate and showed Abdominal Varices and Moderate Ascites. Gastroenterology and IR consulted and Paracentesis cancelled. Workup for Cirrhosis ordered by GI and recommending continuing po Lasix daily. Discussed with IR and will hold off of Paracentesis and TIPS given patient's MELD Score of 18  Assessment & Plan:   Principal Problem:   Major depression, recurrent, chronic (HCC) Active Problems:   Essential hypertension   Hyperlipidemia   Acute kidney injury superimposed on chronic kidney disease ([place])   Diabetes type 2, uncontrolled ([place])   Hyponatremia   Iron deficiency anemia due to chronic blood loss   Pulmonary HTN (HCC)   Acute diastolic CHF (congestive heart failure) (HCC)  Acute Decompensation of Diastolic CHF (congestive Heart Failure) ([place]) -VQ scan was normal  -Right heart cath showed moderate pulmonary venous hypertension,  -2D echo showed an EF of 65-75% with grade 1 diastolic heart failure with right-sided showed only mildly dilated right ventricle. -Cardiology restarted diuretics with Furosemide 40 mg po Daily    -Right heart cath on 06/12/2016 showed pulmonary venous hypertension with an elevated filling pressure.. -Strict I's/O's, Daily Weights SLIV -Patient is -5.280 Liters and Weight is down 21 lbs -Cardiology increased Carvedilol to 25 mg po BID  -Continue to Monitor Volume Status closely; Patient appears Euvolemic today  -Cards recommending continuing po 40 mg Lasix today  -PT/OT Recommending SNF but patient may refuse   Pulmonary Hypertension -Right heart cath on the 28th showed a mixed picture -ECHO showed mildly dilated RV -Cardiology holding diuretics today  -Per Cardiology, treatment will be diuretics rather than selective pulmonary vasodilators at this point -Cardiology recommends continuing Lasix with 40 mg   RSV Bronchitis -Patient's Respiratory Virus Panel was Positive  -Will add Azithromycin for Empiric Treatment x5 Days -Supportive Care with DuoNeb q6hprn -Added Flutter Valve and Incentive Spirometry -Repeat CXR showed no evidence of Active Disease   Nausea and Vomiting, and Abdominal Pain  -Added po/IV Zofran and Promethazine for Breakthrough Nausea/Vomiting -Discussed with patient about getting an Abdominal Scan and she adamantly refused 1/31 and agreeable today -CT Abd/Pelvis w/o Contrast showed 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 Interval development of abdominopelvic moderate ascites. Atrophic appearance of the pancreas and kidneys. Age-indeterminate L3 vertebral body grade 1 superior endplate fracture. -Discussed with Gastroenterology and with Interventional Radiology this AM -Diagnostic and Therapeutic Paracentesis cancelled by GI -GI Recommending no further workup for transient abdominal pain as symptoms improved today and low suspicion for SBP -GI Recommending having an EGD at some point to screen for Esophageal Varices  Acute kidney injury on chronic kidney disease stage IV -Baseline creatinine 1.8-1.9 -Creatinine  has improved today  and went from 2.35 -> 2.42 -> 2.24 -> 2.23 -> 2.34 -> 2.11 -> 2.07 -Nephrology was consulted who agreed with IV diuresis now they have signed off.  -Cardiology changed IV diuresis to po Lasix and resumed 40 mg po daily and will continue  -Continue to monitor and repeat CMP in AM   Essential Hypertension -C/w Amlodipine 5 mg po BID, Clonidine 0.2 mg po 4 Times daily PRN SBP>170, Isosorbide Mononitrate 30 mg po Daily, and Hydralazine 75 mg po q8h (increased by Cardiology)  -Cardiology increased Coreg to 25 mg po BID -C/w Furosemide 40 mg po Daily per Cards   Hyperlipidemia -Lipid Panel showed Cholesterol of 166, HDL of 42, LDL 93, TG of 156, and VLDL of 31  -C/w Atorvastatin 80 mg po Daily  Normocytic Anemia -Decreased TIBC only, would favor due to chronic renal disease. -Hb/Hct went from 10.3/33.3 -> 9.9/32.2 -> 10.3/33.3 -> 9.7/31.8 -> 8.7/27.1 -> 9.4/29.5 -Continue to Monitor for S/Sx of Bleeding; No longer on Heparin gtt -C/w Ferrous Sulfate 325 mg po BIDwm -Follow-up with Nephrology as an outpatient.  Major Depressive Disorder: -Psychiatry was re-consulted due to patient continuing to be Depressed. I spoke with Dr. Darleene Cleaver today and he adjusted some of the patient's Medication's around -Dr. Darleene Cleaver decreased 0.5 mg Daily for Anxiety/Mood, increased Sertraline to 150 mg po Daily for Depression, and recommended increasing Trazodone to 100 mg qHS -Patient's Mood was better today   Rashes: Follow-up with PCP as an outpatient.  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 Interval development of abdominopelvic moderate ascites. Atrophic appearance of the  pancreas and kidneys.  Hyperammonemia -Patient's Ammonia Level was 51 and worsened to 76 -Started Patient on Lactulose and will increase dose  -Repeat Ammonial Level in AM   Hypokalemia -Patient's K+ was 3.1 and improved to 4.1 -Replete with po KCl 40 meq x2 yesterday -Continue to Monitor and Replete as Necessary -Repeat CMP in AM  Elevated Troponin -Patient/s Troponin peaked at 1.08 -Cardiology stopped Heparin gtt yesterday  -C/w ASA 81 mg po Daily, Atorvastatin 80 mg po Daily, and added Carvedilol increased to 12.5 mg po BID -Cardiology also adding Imdur 30 mg po Daily  -Per Cardiology Cath Cancelled and holding off Cath given Elevated Cr -Possible Cath when Cr improves  Tachyarrhythmias -Cardiology adding BB with Carvedilol and increased it to 25 mg BID   -Replete Electrolytes as necessary -Repeat EKG in AM   Diabetes Mellitus Type 2 -CBG's ranging from 168-281 -C/w Insulin Detemir 20 units sq BID and Novolog 3 units TIDwm, -C/w Moderate Novolog SSI  -IF remains elevated will adjust as necessary   Thrombocytopenia -Platelet Count was 110 today -Likely related to Liver Cirrhosis and Platelet Consumption  -Repeat CBC in AM   DVT prophylaxis: Heparin gtt stopped and now on Sq Heparin  Code Status: FULL CODE Family Communication: Discuss with Brother at bedside  Disposition Plan: Remain Inpatient for continued workup and treatment: SNF at D/C  Consultants:   Cardiology CHF Team  Psychiatry  Nephrology  Interventional Radiology  Gastroenterology   Procedures:  ECHOCARDIOGRAM ------------------------------------------------------------------- Study Conclusions  - Left ventricle: The cavity size was normal. Wall thickness was  increased in a pattern of mild LVH. Systolic function was   vigorous. The estimated ejection fraction was in the range of 65%   to 70%. Wall motion was normal; there were no regional wall   motion abnormalities. Doppler parameters  are consistent with   abnormal left ventricular relaxation (grade 1 diastolic   dysfunction). Doppler parameters are consistent with high   ventricular filling pressure. - Aortic valve: There was mild stenosis. - Mitral valve: Calcified annulus. - Left atrium: The atrium was mildly dilated. - Right ventricle: The cavity size was mildly dilated. - Right atrium: The atrium was mildly dilated. - Pulmonary arteries: Systolic pressure was moderately to severely   increased. PA peak pressure: 60 mm Hg (S).  Impressions:  - Vigorous LV systolic function; mild diastolic dysfunction with   elevated LV filling pressure; mild LVH; elevated LVOT gradient   (mean gradient 17 mmHg) at least partially explained by vigorous   LV function; aortic valve not well visualized but appears to open   well; mild biatrial enlargement; mild RVE; mild TR with moderate   to severe pulmonary hypertension.  RIGHT HEART CATHETERIZATION 1. Moderate pulmonary venous hypertension.  2. Elevated R > L heart filling pressures 3. Preserved cardiac output.   Antimicrobials:  Anti-infectives (From admission, onward)   Start     Dose/Rate Route Frequency Ordered Stop   06/15/17 1900  azithromycin (ZITHROMAX) tablet 500 mg     500 mg Oral Daily 06/15/17 1853 06/20/17 0959     Subjective: Seen and examined at bedside and felt better today. No Abdominal Pain, Nausea or vomiting. Feels much better than yesterday. No CP or SOB. No other complaints and wanting to go home tomorrow.   Objective: Vitals:   06/17/17 2006 06/18/17 0512 06/18/17 1221 06/18/17 1941  BP: (!) 149/48 (!) 137/46 (!) 127/44 (!) 142/45  Pulse: 75 66 65 70  Resp: 18 18 20 20   Temp: 99.6 F (37.6 C) 99.1 F (37.3 C) 98.2 F (36.8 C) 99.4 F (37.4 C)  TempSrc: Oral Oral Oral Oral  SpO2: 96% 96% 94% 100%  Weight:  72.1 kg (159 lb)    Height:        Intake/Output Summary (Last 24 hours) at 06/18/2017 2010 Last data filed at 06/18/2017  1941 Gross per 24 hour  Intake 340 ml  Output 400 ml  Net -60 ml   Filed Weights   06/16/17 0606 06/17/17 0603 06/18/17 0512  Weight: 74.2 kg (163 lb 9.3 oz) 70.5 kg (155 lb 8 oz) 72.1 kg (159 lb)   Examination: Physical Exam:  Constitutional: WN/WD, Obese Caucasian female who is much improved from yesterday.  Eyes: Sclerae anicteric. Lids normal ENMT: External Ears and nose appear normal. MMM Neck: Supple with no JVD Respiratory: Diminished but unlabored breathing; No appreciable wheezing/rales/rhonchi Cardiovascular: RRR; S1/S2. Mild LE edema Abdomen: Soft, NT, Distended because body habitus. Bowel sounds present GU: Deferred.  Musculoskeletal: No contractures; No cyanosis Skin: Warm and Dry; No appreciable rashes or lesions on a limited skin eval Neurologic: CN 2-12 grossly intact. No appreciable focal deficits Psychiatric: Pleasant mood and affect today. Awake and alert  Data Reviewed: I have personally reviewed following labs and imaging studies  CBC: Recent Labs  Lab 06/14/17 0806 06/15/17 0600 06/16/17 0536 06/17/17 0453 06/18/17 0820  WBC 6.0 7.4 5.1 5.6 5.1  NEUTROABS 4.5 6.1 3.8 4.2 3.6  HGB 9.2* 10.3* 9.7* 8.7* 9.4*  HCT 30.0* 33.3* 31.8* 27.1* 29.5*  MCV 88.2 88.1 88.8 87.7  88.1  PLT 129* 138* 117* 112* 761*   Basic Metabolic Panel: Recent Labs  Lab 06/14/17 0448 06/14/17 0806 06/15/17 0600 06/16/17 0536 06/17/17 0453 06/18/17 0820  NA 142  --  137 139 136 134*  K 3.0*  --  3.8 3.5 3.1* 4.1  CL 104  --  102 103 100* 100*  CO2 25  --  24 23 25 24   GLUCOSE 96  --  155* 251* 185* 241*  BUN 30*  --  31* 32* 33* 32*  CREATININE 2.24*  --  2.23* 2.34* 2.11* 2.07*  CALCIUM 9.2  --  8.7* 8.8* 8.8* 8.8*  MG 2.0  --  2.1 2.2 2.3 2.3  PHOS  --  3.9 3.7 3.9 3.1 3.0   GFR: Estimated Creatinine Clearance: 22.1 mL/min (A) (by C-G formula based on SCr of 2.07 mg/dL (H)). Liver Function Tests: Recent Labs  Lab 06/15/17 0600 06/16/17 0536 06/17/17 0453  06/18/17 0820  AST 37 40 34 42*  ALT 15 16 16 19   ALKPHOS 67 72 65 77  BILITOT 0.9 0.7 0.6 0.8  PROT 6.4* 6.2* 6.2* 5.9*  ALBUMIN 2.8* 2.8* 2.7* 2.6*   No results for input(s): LIPASE, AMYLASE in the last 168 hours. Recent Labs  Lab 06/16/17 0536 06/17/17 0453 06/18/17 0821  AMMONIA 46* 51* 76*   Coagulation Profile: Recent Labs  Lab 06/12/17 0523 06/16/17 0536 06/17/17 0453 06/18/17 0820  INR 1.18 1.22 1.13 1.15   Cardiac Enzymes: Recent Labs  Lab 06/13/17 1100 06/13/17 1726 06/13/17 2316 06/14/17 0618 06/14/17 1115  TROPONINI 0.11* 0.39* 0.89* 1.08* 0.98*   BNP (last 3 results) No results for input(s): PROBNP in the last 8760 hours. HbA1C: No results for input(s): HGBA1C in the last 72 hours. CBG: Recent Labs  Lab 06/17/17 1633 06/17/17 2111 06/18/17 0741 06/18/17 1121 06/18/17 1645  GLUCAP 311* 281* 211* 186* 168*   Lipid Profile: No results for input(s): CHOL, HDL, LDLCALC, TRIG, CHOLHDL, LDLDIRECT in the last 72 hours. Thyroid Function Tests: No results for input(s): TSH, T4TOTAL, FREET4, T3FREE, THYROIDAB in the last 72 hours. Anemia Panel: No results for input(s): VITAMINB12, FOLATE, FERRITIN, TIBC, IRON, RETICCTPCT in the last 72 hours. Sepsis Labs: No results for input(s): PROCALCITON, LATICACIDVEN in the last 168 hours.  Recent Results (from the past 240 hour(s))  Culture, blood (routine x 2)     Status: None   Collection Time: 06/13/17 12:05 PM  Result Value Ref Range Status   Specimen Description BLOOD LEFT HAND  Final   Special Requests IN PEDIATRIC BOTTLE Blood Culture adequate volume  Final   Culture   Final    NO GROWTH 5 DAYS Performed at [facility], [address] [place], [place] [address]    Report Status 06/18/2017 FINAL  Final  Culture, blood (routine x 2)     Status: None   Collection Time: 06/13/17 12:10 PM  Result Value Ref Range Status   Specimen Description BLOOD ARM LEFT  Final   Special Requests IN  PEDIATRIC BOTTLE Blood Culture adequate volume  Final   Culture   Final    NO GROWTH 5 DAYS Performed at [facility], [place] [address], [place], [place] [address]    Report Status 06/18/2017 FINAL  Final  Respiratory Panel by PCR     Status: Abnormal   Collection Time: 06/15/17 10:59 AM  Result Value Ref Range Status   Adenovirus NOT DETECTED NOT DETECTED Final   Coronavirus 229E NOT  DETECTED NOT DETECTED Final   Coronavirus HKU1 NOT DETECTED NOT DETECTED Final   Coronavirus NL63 NOT DETECTED NOT DETECTED Final   Coronavirus OC43 NOT DETECTED NOT DETECTED Final   Metapneumovirus NOT DETECTED NOT DETECTED Final   Rhinovirus / Enterovirus NOT DETECTED NOT DETECTED Final   Influenza A NOT DETECTED NOT DETECTED Final   Influenza B NOT DETECTED NOT DETECTED Final   Parainfluenza Virus 1 NOT DETECTED NOT DETECTED Final   Parainfluenza Virus 2 NOT DETECTED NOT DETECTED Final   Parainfluenza Virus 3 NOT DETECTED NOT DETECTED Final   Parainfluenza Virus 4 NOT DETECTED NOT DETECTED Final   Respiratory Syncytial Virus DETECTED (A) NOT DETECTED Final    Comment: CRITICAL RESULT CALLED TO, READ BACK BY AND VERIFIED WITH: Clarene Essex RN 15:45 06/15/17 ([email or web address])    Bordetella pertussis NOT DETECTED NOT DETECTED Final   Chlamydophila pneumoniae NOT DETECTED NOT DETECTED Final   Mycoplasma pneumoniae NOT DETECTED NOT DETECTED Final    Radiology Studies: Ct Abdomen Pelvis Wo Contrast  Result Date: 06/17/2017 CLINICAL DATA:  Abdominal pain, nausea vomiting. EXAM: CT ABDOMEN AND PELVIS WITHOUT CONTRAST TECHNIQUE: Multidetector CT imaging of the abdomen and pelvis was performed following the standard protocol without IV contrast. COMPARISON:  Abdominal ultrasound 06/11/2017 FINDINGS: Lower chest: Calcific atherosclerotic disease of the coronary arteries and aorta. Hepatobiliary: Cirrhotic appearance of the liver. Moderate perihepatic water density ascites. The the gallbladder is decompressed and  therefore not well evaluated. Pancreas: Atrophic pancreas. Spleen: Mild splenomegaly. Adrenals/Urinary Tract: Bilateral cortical thinning. Evaluation for renal masses precluded by lack of IV contrast. No evidence of hydronephrosis. Stomach/Bowel: Stomach is within normal limits. Post appendectomy. No evidence of bowel wall thickening, distention, or inflammatory changes. Vascular/Lymphatic: Aortic atherosclerosis. No enlarged abdominal or pelvic lymph nodes. Upper abdominal varices. Reproductive: Status post hysterectomy. No adnexal masses. Other: Moderate pelvic ascites. Musculoskeletal: Age-indeterminate grade 1 compression fracture of superior endplate of L3 vertebral body. Schmorl's nodes versus mild compression fracture of T11 superior endplate in C16 superior endplate. 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 Interval development of abdominopelvic ascites. Atrophic appearance of the pancreas and kidneys. Age-indeterminate L3 vertebral body grade 1 superior endplate fracture. Electronically Signed   By: Fidela Salisbury M.D.   On: 06/17/2017 12:13   Scheduled Meds: . amLODipine  5 mg Oral BID  . aspirin EC  81 mg Oral Daily  . atorvastatin  80 mg Oral q1800  . azithromycin  500 mg Oral Daily  . calcitRIOL  0.5 mcg Oral Once per day on Mon Fri  . carvedilol  25 mg Oral BID WC  . cholecalciferol  1,000 Units Oral BID  . clonazePAM  0.5 mg Oral Daily  . docusate sodium  200 mg Oral QHS  . feeding supplement (ENSURE ENLIVE)  237 mL Oral TID BM  . ferrous sulfate  325 mg Oral BID WC  . furosemide  40 mg Oral Daily  . guaiFENesin  1,200 mg Oral BID  . heparin injection (subcutaneous)  5,000 Units Subcutaneous Q8H  . hydrALAZINE  75 mg Oral Q8H  . insulin aspart  0-15 Units Subcutaneous TID WC  . insulin aspart  3 Units Subcutaneous TID WC  . insulin detemir  20 Units Subcutaneous BID  . isosorbide mononitrate  30 mg Oral Daily  . lactulose  20 g Oral BID    . sertraline  150 mg Oral Daily  . sodium chloride flush  3 mL Intravenous Q12H  . sodium chloride flush  3  mL Intravenous Q12H  . traZODone  100 mg Oral QHS  . trimethoprim-polymyxin b  2 drop Both Eyes BID   Continuous Infusions: . sodium chloride 250 mL (06/14/17 1242)    LOS: 8 days   Kerney Elbe, DO [facility] Pager [phone]  If 7PM-7AM, please contact night-coverage [email or web address] Password [email or web address] 06/18/2017, 8:10 PM

## 2017-06-19 ENCOUNTER — Inpatient Hospital Stay (HOSPITAL_COMMUNITY): Payer: Medicare HMO

## 2017-06-19 LAB — CBC WITH DIFFERENTIAL/PLATELET
Basophils Absolute: 0 10*3/uL (ref 0.0–0.1)
Basophils Relative: 0 %
EOS PCT: 5 %
Eosinophils Absolute: 0.2 10*3/uL (ref 0.0–0.7)
HEMATOCRIT: 28.8 % — AB (ref 36.0–46.0)
Hemoglobin: 9 g/dL — ABNORMAL LOW (ref 12.0–15.0)
Lymphocytes Relative: 16 %
Lymphs Abs: 0.7 10*3/uL (ref 0.7–4.0)
MCH: 27.4 pg (ref 26.0–34.0)
MCHC: 31.3 g/dL (ref 30.0–36.0)
MCV: 87.5 fL (ref 78.0–100.0)
MONO ABS: 0.4 10*3/uL (ref 0.1–1.0)
MONOS PCT: 8 %
NEUTROS ABS: 3.1 10*3/uL (ref 1.7–7.7)
Neutrophils Relative %: 71 %
Platelets: 98 10*3/uL — ABNORMAL LOW (ref 150–400)
RBC: 3.29 MIL/uL — ABNORMAL LOW (ref 3.87–5.11)
RDW: 15.9 % — AB (ref 11.5–15.5)
WBC: 4.4 10*3/uL (ref 4.0–10.5)

## 2017-06-19 LAB — COMPREHENSIVE METABOLIC PANEL
ALK PHOS: 78 U/L (ref 38–126)
ALT: 24 U/L (ref 14–54)
ANION GAP: 12 (ref 5–15)
AST: 46 U/L — ABNORMAL HIGH (ref 15–41)
Albumin: 2.7 g/dL — ABNORMAL LOW (ref 3.5–5.0)
BILIRUBIN TOTAL: 0.7 mg/dL (ref 0.3–1.2)
BUN: 35 mg/dL — ABNORMAL HIGH (ref 6–20)
CALCIUM: 8.8 mg/dL — AB (ref 8.9–10.3)
CO2: 22 mmol/L (ref 22–32)
Chloride: 101 mmol/L (ref 101–111)
Creatinine, Ser: 2.12 mg/dL — ABNORMAL HIGH (ref 0.44–1.00)
GFR calc Af Amer: 26 mL/min — ABNORMAL LOW (ref >60)
GFR, EST NON AFRICAN AMERICAN: 22 mL/min — AB (ref >60)
Glucose, Bld: 194 mg/dL — ABNORMAL HIGH (ref 65–99)
Potassium: 3.8 mmol/L (ref 3.5–5.1)
Sodium: 135 mmol/L (ref 135–145)
TOTAL PROTEIN: 6 g/dL — AB (ref 6.5–8.1)

## 2017-06-19 LAB — PHOSPHORUS: PHOSPHORUS: 3.5 mg/dL (ref 2.5–4.6)

## 2017-06-19 LAB — AMMONIA: Ammonia: 58 umol/L — ABNORMAL HIGH (ref 9–35)

## 2017-06-19 LAB — PROTIME-INR
INR: 1.17
Prothrombin Time: 14.8 seconds (ref 11.4–15.2)

## 2017-06-19 LAB — FERRITIN: Ferritin: 108 ng/mL (ref 11–307)

## 2017-06-19 LAB — GLUCOSE, CAPILLARY
GLUCOSE-CAPILLARY: 201 mg/dL — AB (ref 65–99)
Glucose-Capillary: 239 mg/dL — ABNORMAL HIGH (ref 65–99)

## 2017-06-19 LAB — MAGNESIUM: MAGNESIUM: 2.3 mg/dL (ref 1.7–2.4)

## 2017-06-19 MED ORDER — HYDRALAZINE HCL 50 MG PO TABS
75.0000 mg | ORAL_TABLET | Freq: Three times a day (TID) | ORAL | Status: DC
  Filled 2017-06-19: qty 1

## 2017-06-19 MED ORDER — LACTULOSE 10 GM/15ML PO SOLN: 30.0000 g | Freq: Two times a day (BID) | ORAL | 0 refills | Status: AC

## 2017-06-19 MED ORDER — FUROSEMIDE 40 MG PO TABS: 40.0000 mg | ORAL_TABLET | Freq: Every day | ORAL | 0 refills | Status: DC

## 2017-06-19 MED ORDER — ISOSORBIDE MONONITRATE ER 30 MG PO TB24: 30.0000 mg | ORAL_TABLET | Freq: Every day | ORAL | 0 refills | Status: AC

## 2017-06-19 MED ORDER — GUAIFENESIN ER 600 MG PO TB12: 1200.0000 mg | ORAL_TABLET | Freq: Two times a day (BID) | ORAL | 0 refills | Status: DC

## 2017-06-19 MED ORDER — ENSURE ENLIVE PO LIQD: 237.0000 mL | Freq: Three times a day (TID) | ORAL | 12 refills | Status: AC

## 2017-06-19 MED ORDER — TRAZODONE HCL 100 MG PO TABS: 100.0000 mg | ORAL_TABLET | Freq: Every day | ORAL | 0 refills | Status: AC

## 2017-06-19 MED ORDER — SALINE SPRAY 0.65 % NA SOLN: 1.0000 | NASAL | 0 refills | Status: AC | PRN

## 2017-06-19 MED ORDER — HYDRALAZINE HCL 50 MG PO TABS: 100.0000 mg | ORAL_TABLET | Freq: Three times a day (TID) | ORAL | Status: DC

## 2017-06-19 MED ORDER — HYDRALAZINE HCL 25 MG PO TABS: 75.0000 mg | ORAL_TABLET | Freq: Three times a day (TID) | ORAL | 0 refills | Status: DC

## 2017-06-19 MED ORDER — DICLOFENAC SODIUM 1 % TD GEL: 2.0000 g | Freq: Four times a day (QID) | TRANSDERMAL | 0 refills | Status: DC | PRN

## 2017-06-19 MED ORDER — ATORVASTATIN CALCIUM 80 MG PO TABS: 80.0000 mg | ORAL_TABLET | Freq: Every day | ORAL | 0 refills | Status: AC

## 2017-06-19 MED ORDER — SERTRALINE HCL 50 MG PO TABS: 150.0000 mg | ORAL_TABLET | Freq: Every day | ORAL | 0 refills | Status: DC

## 2017-06-19 MED ORDER — CARVEDILOL 25 MG PO TABS: 25.0000 mg | ORAL_TABLET | Freq: Two times a day (BID) | ORAL | 0 refills | Status: DC

## 2017-06-19 MED ORDER — CLONAZEPAM 0.5 MG PO TABS: 0.5000 mg | ORAL_TABLET | Freq: Every day | ORAL | 0 refills | Status: DC

## 2017-06-19 NOTE — Progress Notes (Signed)
Pt had quiet night .  No signs distress to report anxious to go home. Patient wanted purewick removed said it was irritating.  Encouraged patient to use BPan since she may be going home soon, she agreed. PRn for pain administered per order X1.

## 2017-06-19 NOTE — Clinical Social Work Note (Signed)
Clinical Social Work Assessment  Patient Details  Name: Stacy Smith MRN: [id number] Date of Birth: 1946/03/01  Date of referral:  06/19/17               Reason for consult:  Facility Placement, Discharge Planning, Mental Health Concerns                Permission sought to share information with:    Permission granted to share information::  Yes, Verbal Permission Granted  Name::     Vernard Gambles  Agency::     Relationship::  Brother  Contact Information:  [phone]  Housing/Transportation Living arrangements for the past 2 months:  [place] of Information:  Patient, Medical Team, Siblings, Psychiatric Consultation Patient Interpreter Needed:  None Criminal Activity/Legal Involvement Pertinent to Current Situation/Hospitalization:  No - Comment as needed Significant Relationships:  Adult Children, Siblings Lives with:  Self Do you feel safe going back to the place where you live?  Yes Need for family participation in patient care:  Yes (Comment)  Care giving concerns:  PT recommending SNF once medically stable for discharge.   Social Worker assessment / plan:  CSW met with patient. Brother at bedside but left the room during conversation. CSW introduced role and explained that PT recommendations would be discussed. Patient does not want SNF placement but is agreeable to home health. RNCM notified. Patient confirmed she lives alone but stated her daughter and son-in-law will be staying with her at discharge. She is eager to return home. Patient confirmed that she is agreeable to following up with Dr. Posey Pronto after discharge for medication management and therapy, per psych notes. CSW called Dr. Serita Grit office, [facility] ([address] [place] [address]). Secretary stated that for first appointments they have a walk-in clinic Monday-Friday from 8:00 am-3:00 pm. Patient will meet with the access counselor on first appointment. This information  was printed off and given to patient. No further concerns. CSW signing off as social work intervention is no longer needed.   Employment status:  Retired Nurse, adult PT Recommendations:  [place] / Referral to community resources:  [place]  Patient/Family's Response to care:  Patient prefers home health. Patient's family supportive and involved in patient's care. Patient appreciated social work intervention.  Patient/Family's Understanding of and Emotional Response to Diagnosis, Current Treatment, and Prognosis:  Patient has a good understanding of the reason for admission and need for continued therapy after discharge. Patient appears happy with hospital care.  Emotional Assessment Appearance:  Appears stated age Attitude/Demeanor/Rapport:  Engaged, Gracious Affect (typically observed):  Accepting, Appropriate, Calm, Pleasant Orientation:  Oriented to Self, Oriented to Place, Oriented to  Time, Oriented to Situation Alcohol / Substance use:  Never Used Psych involvement (Current and /or in the community):  No (Comment)  Discharge Needs  Concerns to be addressed:  Care Coordination Readmission within the last 30 days:  No Current discharge risk:  Dependent with Mobility Barriers to Discharge:  Continued Medical Work up   Candie Chroman, LCSW 06/19/2017, 12:01 PM

## 2017-06-19 NOTE — Progress Notes (Signed)
SWAT RN provided discharge papers/information for patient to be discharged home while her brother was here to transport her. Pt demonstrated no s/sx of distress. All personal belongings with patient.

## 2017-06-19 NOTE — Progress Notes (Signed)
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     PT Comments    Patient received in bed, pleasant and willing to participate in PT today; she continues to require MinA for functional bed mobility, however was able to complete functional transfers and gait training approximately 100ft with RW and min guard today. Overall gait distance limited by fatigue, however patient steady at self-selected pace today. She continues to refuse SNF, however per improved mobility today would likely benefit from HHPT if she is to be discharged home by MD. She was left up in the chair with all needs met this afternoon.     Follow Up Recommendations  SNF;Home health PT(patient refusing SNF, may benefit from HHPT )     Equipment Recommendations  None recommended by PT    Recommendations for Other Services       Precautions / Restrictions Precautions Precautions: Fall Restrictions Weight Bearing Restrictions: No    Mobility  Bed Mobility Overal bed mobility: Needs Assistance Bed Mobility: Supine to Sit     Supine to sit: Min assist     General bed mobility comments: MinA to power up trunk to sitting upright at bedside   Transfers Overall transfer level: Needs assistance Equipment used: Rolling walker (2 wheeled) Transfers: Sit to/from Stand Sit to Stand: Min guard         General transfer comment: min guard for safety, cues for safety and sequencing   Ambulation/Gait Ambulation/Gait assistance: Min guard Ambulation Distance (Feet): 100  Feet Assistive device: Rolling walker (2 wheeled) Gait Pattern/deviations: Step-to pattern;Wide base of support     General Gait Details: gait distance limited by fatigue, weakness    Stairs            Wheelchair Mobility    Modified Rankin (Stroke Patients Only)       Balance Overall balance assessment: Needs assistance Sitting-balance support: Bilateral upper extremity supported;Feet supported Sitting balance-Leahy Scale: Good     Standing balance support: Bilateral upper extremity supported;During functional activity Standing balance-Leahy Scale: Fair Standing balance comment: reliance on UE support                            Cognition Arousal/Alertness: Awake/alert Behavior During Therapy: Anxious Overall Cognitive Status: History of cognitive impairments - at baseline(per daughter ) Area of Impairment: Memory                     Memory: Decreased short-term memory                Exercises      General Comments General comments (skin integrity, edema, etc.): patient continues to not be open to SNF, determined to go home       Pertinent Vitals/Pain Pain Assessment: No/denies pain Faces Pain Scale: No hurt Pain Intervention(s): Monitored during session;Limited activity within patient's tolerance    Home Living                      Prior Function              PT Goals (current goals can now be found in the care plan section) Acute Rehab PT Goals Patient Stated Goal: return home to her pet rabbit PT Goal Formulation: With patient Time For Goal Achievement: 06/25/17 Potential to Achieve Goals: Fair Progress towards PT goals: Progressing toward goals    Frequency    Min 3X/week      PT Plan Current plan remains appropriate    Co-evaluation              AM-PAC PT "6 Clicks" Daily Activity  Outcome Measure  Difficulty turning over in bed (including adjusting bedclothes, sheets and blankets)?:  Unable Difficulty moving from lying on back to sitting on the side of the bed? : Unable Difficulty sitting down on and standing up from a chair with arms (e.g., wheelchair, bedside commode, etc,.)?: Unable Help needed moving to and from a bed to chair (including a wheelchair)?: A Little Help needed walking in hospital room?: A Little Help needed climbing 3-5 steps with a railing? : A Lot 6 Click Score: 11    End of Session Equipment Utilized During Treatment: Gait belt Activity Tolerance: Patient limited by fatigue Patient left: in chair;with call bell/phone within reach   PT Visit Diagnosis: Muscle weakness (generalized) (M62.81)     Time: 1257-1320 PT Time Calculation (min) (ACUTE ONLY): 23 min  Charges:  $Gait Training: 8-22 mins $Therapeutic Activity: 8-22 mins                    G Codes:       Kristen Unger PT, DPT, CBIS  Supplemental Physical Therapist [place]   Pager [phone]    

## 2017-06-19 NOTE — Progress Notes (Signed)
Orders for Interim [facility] faxed as requested; Aneta Mins [phone]

## 2017-06-19 NOTE — Progress Notes (Signed)
Introduced myself to the patient.  Patient I pretty quiet stating she would like to go home.  When asked if she is talking about a house, patient states yes.  She would like to be discharged today.  Patient request prayer for herself and for her family.  Will continue to follow as needed.    06/19/17 1003  Clinical Encounter Type  Visited With Patient  Visit Type Initial;Spiritual support  Spiritual Encounters  Spiritual Needs Prayer;Grief support;Emotional

## 2017-06-19 NOTE — Progress Notes (Signed)
Heart Failure Navigator Consult Note  Presentation: per Dr. 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  She presented to the ER from home with complaints of shortness of breath and leg swelling over the past 2 weeks. Upon presentation to the emergency department family privately told staff that the patient has been having visual hallucinations about dead relatives after her spouse who passed away last 17-Jun-2022. The patient is often emotional and somewhat needy she has become overly emotional and been crying calling her daughter multiple times. She woke her daughter in the middle the night twice last night.   The patient states that over the past 2 weeks she has had SOB, PND and orthopnea as well as severe LE edema.  She denies any excess sodium in her diet. She denies any chest pain but says that when she gets SOB occasionally her chest with feel tight.  She has paroxysmal nocturnal dyspnea and wakes up at night, she has had some confusion.   Evaluation in the emergency department revealed a chest x-ray consistent with bronchitis. Her renal function has worsened with a creatinine of 2.43 up from 1.97 in 17-Jun-2016. Ammonia level elevated  Clinical Course:   Past Medical History:  Diagnosis Date  . Anxiety   . Bradycardia   . CHF (congestive heart failure) ([place])   . Chronic kidney disease    CKD Stage 3  . Depression   . Diabetes mellitus without complication ([place])   . Hyperkalemia   . Hypertension   . Lupus   . Peripheral neuropathy   . Rheumatoid arthritis ([place])   . Seizures ([place])   . Shortness of breath dyspnea     Social History   Socioeconomic  History  . Marital status: Widowed    Spouse name: None  . Number of children: None  . Years of education: None  . Highest education level: None  Social Needs  . Financial resource strain: None  . Food insecurity - worry: None  . Food insecurity - inability: None  . Transportation needs - medical: None  . Transportation needs - non-medical: None  Occupational History  . None  Tobacco Use  . Smoking status: Never Smoker  . Smokeless tobacco: Never Used  Substance and Sexual Activity  . Alcohol use: No    Alcohol/week: 0.0 oz  . Drug use: No  . Sexual activity: None  Other Topics Concern  . None  Social History Narrative  . None    ECHO:Study Conclusions-06/11/17  - Left ventricle: The cavity size was normal. Wall thickness was   increased in a pattern of mild LVH. Systolic function was   vigorous. The estimated ejection fraction was in the range of 65%   to 70%. Wall motion was normal; there were no regional wall   motion abnormalities. Doppler parameters are consistent with   abnormal left ventricular relaxation (grade 1 diastolic   dysfunction). Doppler parameters are consistent with high   ventricular filling pressure. - Aortic valve: There was mild stenosis. - Mitral valve: Calcified annulus. - Left atrium: The atrium was mildly dilated. - Right ventricle: The cavity size was mildly dilated. - Right atrium: The atrium was mildly dilated. - Pulmonary arteries: Systolic pressure was moderately to severely  increased. PA peak pressure: 60 mm Hg (S).  Impressions:  - Vigorous LV systolic function; mild diastolic dysfunction with   elevated LV filling pressure; mild LVH; elevated LVOT gradient   (mean gradient 17 mmHg) at least partially explained by vigorous   LV function; aortic valve not well visualized but appears to open   well; mild biatrial enlargement; mild RVE; mild TR with moderate   to severe pulmonary hypertension.   BNP    Component Value  Date/Time   BNP 470.6 (H) 06/11/2017 1660    ProBNP No results found for: PROBNP   Education Assessment and Provision:  Detailed education and instructions provided on heart failure disease management including the following:  Signs and symptoms of Heart Failure When to call the physician Importance of daily weights Low sodium diet Fluid restriction Medication management Anticipated future follow-up appointments  Patient education given on each of the above topics.  Patient acknowledges understanding and acceptance of all instructions.  I spoke with Ms. Evenson and brother regarding her HF and current hospitalization.  She has a scale and weighs each day.  I reviewed when to contact the physician related to worsening signs and symptoms.  She tells me that she eats "low sodium" and "never" uses table salt.   I reviewed high sodium foods to avoid and reinforced a low sodium diet.   She plans to follow in the A[facility] Clinic after discharge.  She would benefit from [facility] however she lives in [place] and is ineligible.   Education Materials:  "Living Better With Heart Failure" Booklet, Daily Weight Tracker Tool    High Risk Criteria for Readmission and/or Poor Patient Outcomes:   EF <30%- 65-70% with grade 1 dias dys  2 or more admissions in 6 months-no  Difficult social situation- no  Demonstrates medication noncompliance- denies   Barriers of Care:  Knowledge and compliance  Discharge Planning:  Plans to return to home to [place] with daughter staying with her for a "while".  She would benefit from [facility] for ongoing education and symptom recognition.  I have given her and her brother a map and directions to the AHF Clinic.

## 2017-06-19 NOTE — Progress Notes (Signed)
Patient ID: Stacy Smith, female   DOB: [birth year], 72 y.o.   MRN: [id number]     Advanced Heart Failure Rounding Note   HF Cardiology: Aundra Dubin   Subjective:    Weight down 1 lb overnight. +300 yesterday. Creatinine 2.07 -> 2.1.  No CP, SOB, dizziness, or palpitations. Her mood is better but she wants to go home today. Has not walked.  RHC (1/18): mean RA 8, PA 78/25 mean 45, mean PCWP 18, CI 3.6, PVR 4.6 WU Echo (9/18): EF 60-65%, normal RV systolic function, PASP 58, mild TR and MR.  Echo (1/19): EF 65-70%, mild aortic stenosis, mildly dilated RV normal systolic function, PASP 60 mmHg.  V/Q scan (1/19): No PE Abdominal US (1/19): Liver appears cirrhotic. No gallstones  RHC Procedural Findings (06/12/17): Hemodynamics (mmHg) RA mean 13 RV 65/15 PA 61/27, mean 39 PCWP mean 20 Oxygen saturations: PA 80% AO 97% Cardiac Output (Fick) 10.1  Cardiac Index (Fick) 5.87 PVR 1.9 WU Cardiac Output (Thermo) 9.23 Cardiac Output (Thermo) 5.34 PVR 2 WU  Objective:   Weight Range: 158 lb (71.7 kg) Body mass index is 30.86 kg/m.   Vital Signs:   Temp:  [98.2 F (36.8 C)-99.4 F (37.4 C)] 98.4 F (36.9 C) (02/04 0413) Pulse Rate:  [65-75] 75 (02/04 0413) Resp:  [20] 20 (02/04 0413) BP: (127-148)/(44-47) 148/47 (02/04 0413) SpO2:  [94 %-100 %] 97 % (02/04 0413) Weight:  [158 lb (71.7 kg)] 158 lb (71.7 kg) (02/04 0413) Last BM Date: 06/19/17  Weight change: Filed Weights   06/17/17 0603 06/18/17 0512 06/19/17 0413  Weight: 155 lb 8 oz (70.5 kg) 159 lb (72.1 kg) 158 lb (71.7 kg)    Intake/Output:   Intake/Output Summary (Last 24 hours) at 06/19/2017 0806 Last data filed at 06/19/2017 0300 Gross per 24 hour  Intake 700 ml  Output 400 ml  Net 300 ml      Physical Exam    General: Pale. No resp difficulty. HEENT: Normal Neck: Supple. JVP~8 . Carotids 2+ bilat; no bruits. No thyromegaly or nodule noted. Cor: PMI nondisplaced. RRR, No M/G/R noted Lungs: Expiratory  wheezing throughout. Nonproductive cough. Abdomen: Soft, non-tender, non-distended, no HSM. No bruits or masses. +BS  Extremities: No cyanosis, clubbing, or rash. R and LLE no edema.  Neuro: Alert & orientedx3, cranial nerves grossly intact. moves all 4 extremities w/o difficulty. Affect pleasant   Telemetry   NSR 70's. Personally reviewed.  Labs    CBC Recent Labs    06/18/17 0820 06/19/17 0612  WBC 5.1 4.4  NEUTROABS 3.6 3.1  HGB 9.4* 9.0*  HCT 29.5* 28.8*  MCV 88.1 87.5  PLT 110* 98*   Basic Metabolic Panel Recent Labs    06/18/17 0820 06/19/17 0612  NA 134* 135  K 4.1 3.8  CL 100* 101  CO2 24 22  GLUCOSE 241* 194*  BUN 32* 35*  CREATININE 2.07* 2.12*  CALCIUM 8.8* 8.8*  MG 2.3 2.3  PHOS 3.0 3.5   Liver Function Tests Recent Labs    06/18/17 0820 06/19/17 0612  AST 42* 46*  ALT 19 24  ALKPHOS 77 78  BILITOT 0.8 0.7  PROT 5.9* 6.0*  ALBUMIN 2.6* 2.7*   No results for input(s): LIPASE, AMYLASE in the last 72 hours. Cardiac Enzymes No results for input(s): CKTOTAL, CKMB, CKMBINDEX, TROPONINI in the last 72 hours.  BNP: BNP (last 3 results) Recent Labs    06/10/17 1538 06/11/17 0632  BNP 394.7* 470.6*    ProBNP (  last 3 results) No results for input(s): PROBNP in the last 8760 hours.   D-Dimer No results for input(s): DDIMER in the last 72 hours. Hemoglobin A1C No results for input(s): HGBA1C in the last 72 hours. Fasting Lipid Panel No results for input(s): CHOL, HDL, LDLCALC, TRIG, CHOLHDL, LDLDIRECT in the last 72 hours. Thyroid Function Tests No results for input(s): TSH, T4TOTAL, T3FREE, THYROIDAB in the last 72 hours.  Invalid input(s): FREET3  Other results:   Imaging    Dg Chest Port 1 View  Result Date: 06/19/2017 CLINICAL DATA:  Shortness of Breath EXAM: PORTABLE CHEST 1 VIEW COMPARISON:  06/16/2017 FINDINGS: Cardiac shadow is stable. The lungs are well aerated bilaterally. No focal infiltrate or sizable effusion is seen.  No bony abnormality is noted. IMPRESSION: No active disease. Electronically Signed   By: Inez Catalina M.D.   On: 06/19/2017 07:23     Medications:     Scheduled Medications: . amLODipine  5 mg Oral BID  . aspirin EC  81 mg Oral Daily  . atorvastatin  80 mg Oral q1800  . azithromycin  500 mg Oral Daily  . calcitRIOL  0.5 mcg Oral Once per day on Mon Fri  . carvedilol  25 mg Oral BID WC  . cholecalciferol  1,000 Units Oral BID  . clonazePAM  0.5 mg Oral Daily  . docusate sodium  200 mg Oral QHS  . feeding supplement (ENSURE ENLIVE)  237 mL Oral TID BM  . ferrous sulfate  325 mg Oral BID WC  . furosemide  40 mg Oral Daily  . guaiFENesin  1,200 mg Oral BID  . heparin injection (subcutaneous)  5,000 Units Subcutaneous Q8H  . hydrALAZINE  75 mg Oral Q8H  . insulin aspart  0-15 Units Subcutaneous TID WC  . insulin aspart  3 Units Subcutaneous TID WC  . insulin detemir  20 Units Subcutaneous BID  . isosorbide mononitrate  30 mg Oral Daily  . lactulose  30 g Oral BID  . sertraline  150 mg Oral Daily  . sodium chloride flush  3 mL Intravenous Q12H  . sodium chloride flush  3 mL Intravenous Q12H  . traZODone  100 mg Oral QHS  . trimethoprim-polymyxin b  2 drop Both Eyes BID    Infusions: . sodium chloride 250 mL (06/14/17 1242)    PRN Medications: sodium chloride, acetaminophen **OR** acetaminophen, acetaminophen, cloNIDine, diclofenac sodium, ipratropium-albuterol, menthol-cetylpyridinium, ondansetron (ZOFRAN) IV, ondansetron, oxyCODONE, promethazine, sodium chloride, sodium chloride flush    Patient Profile   72 yo with history of CKD stage IV, diastolic CHF, HTN, ?cirrhosis, pulmonary hypertension was admitted with acute on chronic diastolic CHF.   Assessment/Plan   1. Acute on chronic diastolic CHF: Echo 3/81 with EF 65-70%, mildly dilated RV.  [place] in 1/18 with severe pulmonary hypertension, likely mixed pulmonary venous and pulmonary arterial HTN with PVR 4.6 WU. [place]  06/12/17, however, showed pulmonary venous hypertension and elevated filling pressures with PVR about 2 WU.   - Volume status stable - Continue Lasix 40 mg PO daily - She may benefit from Cardiomems as outpatient => will workup for this.  2. 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  Echo with mildly dilated RV.  V/Q scan with no evidence for chronic PE. RHC 1/19 (this admission), however, showed primarily pulmonary venous hypertension with PVR only 2 WU. - Treatment at this point will be diuretics rather than selective pulmonary vasodilators. No change.  3. CKD: Stage 3-4.  Baseline creatinine 1.8-1.9 - Creatinine 2.12 this am. - Followed by nephrology in [place].  4. HTN:  - Elevated SBP 120-140's  - Continue coreg 25 mg twice a day.  - Continue hydralazine, amlodipine, imdur.  Not a great candidate for spiro with CKD, but if creatinine improves could consider in future. 5. Cirrhosis: RUQ [place] suggestive of cirrhosis.  - CT abd/pelvis: liver cirrhosis with splenomegaly and extensive abdominal varices, abdominopelvic ascites, and atrophic appearance of pancreas and kidneys - GI following. No paracentesis at this time. Continue lactulose. They will follow up outpatient. 6. Abd pain: RUQ [place] did not show evidence of cholecystitis.  - Denies abdominal pain or nausea this morning. 7. ID: Fever 1/29. Tmax 99.6 over weekend.  RSV on respiratory culture.  - CXR this am unremarkable - UA negative.  Cultures so far negative.  Not on antibiotics.  8. Elevated troponin: Had episode of chest pain early am 1/29 in setting of nausea/vomiting.  Had chest pain again 1/30 in setting of SVT/NSVT.  ?ACS/NSTEMI versus demand ischemia with volume overload/HTN/tachycardia in setting of elevated creatinine. Troponin trended up to 1.08 then down. No s/s ischemia - Continue ASA 81 - Continue Imdur 30 daily.  - Continue atorvastatin 80 mg daily.  -  Continue Coreg 25 BID.  - Hold off on cath for now, may consider as outpatient when creatinine down to baseline.  9. SVT/NSVT: Patient appeared to have 2 tachyarrhythmias on 1/30 => there was a narrow complex SVT and there was also another tachyarrhythmia where the axis changed though QRS only mildly widened => possible NSVT.  Recent echo with normal EF.  - Continue coreg - No tachyarrhythmias overnight. No palpitations. 10. Depression - Pysh saw patient 2/2: increased zoloft and decreased clonazepam - Pt feels mood has improved 11. Deconditioning - PT/OT recommending SNF, but pt says she will not go - Her daughter and son-in-law can stay with her and she is open to [facility] PT/OT.  Length of Stay: [address], NP-C 06/19/17, 8:09 AM Pager [phone] (M-F 7a-4p)  Advanced Heart Failure Team Pager [phone] (M-F; 7a - 4p)  Please contact [facility] for night-coverage after hours (4p -7a ) and weekends on [email or web address] 8:06 AM  Patient seen with NP, agree with the above note.  No chest pain.  No more SVT.  Creatinine fairly stable at 1.0.  Volume status looks good on Lasix 40 mg daily.    I will hold off on cardiac cath for now, will consider in future if she has any more chest pain or if creatinine trends back down < 2 where her previous baseline had been.   Loralie Champagne 06/19/2017 1:35 PM

## 2017-06-19 NOTE — Discharge Summary (Signed)
Physician Discharge Summary  Stacy Smith XIP:[id number] DOB: 09-17-1945 DOA: 06/10/2017  PCP: Jacqualine Code, DO  Admit date: 06/10/2017 Discharge date: 06/19/2017  Admitted From: Home Disposition: [facility] with PT/OT/RN/Aide as patient refused SNF  Recommendations for Outpatient Follow-up:  1. Follow up with PCP in 1-2 weeks 2. Follow up with Cardiology as an outpatient 3. Follow up with Psychiatry as an outpatient 4. Follow up with Nephrology as an outpatient  5. Follow up with Gastroenterology as an outpatient  6. Please obtain CMP/CBC, Mag, Phos in one week 7. Please follow up on the following pending results: GI work up for Cirrhosis   Home Health: YES Equipment/Devices:  None recommended by PT  Discharge Condition: Stable CODE STATUS: FULL CODE Diet recommendation: Heart Healthy Low Salt Diet    Brief/Interim Summary: Stacy Smith an 72 y.o.femalepast medical history significant for pulmonary hypertension, with a right heart cath in January 2018 that showed pressure of 74,, chronic diastolic heart failure ,diabetes mellitus with chronic renal disease stage III and other comorbids who presents to the ED with dyspnea and lower extremity edema. Found to have an Acute Decompensation of Diastolic CHF and Pulmonary HTN. She was found to have an Elevated Troponin and ?CP so Cardiology started her on Heparin gtt but has not stopped. Patient now RSV + and plan was to go to Cath this afternoon if Cr is improve, however did not and so Cardiology did not Cath the patient. Patient continued to Complain of Abdominal Pain so CT Abdomen was ordered to further evaluate and showed Abdominal Varices and Moderate Ascites. Gastroenterology and IR consulted and Paracentesis cancelled and workup for Cirrhosis ordered by GI and recommending continuing po Lasix daily. GI recommended no changes and follow up as an outpatient. Cardiology evaluated and recommended continuing po Lasix. PT  evaluated and recommended SNF but patient refused. She was deemed medically stable to D/C Home with Home Health PT and will need to follow up with Cardiology, Nephrology, Gastroenterology and Psychiatry at D/C.  Discharge Diagnoses:  Principal Problem:   Major depression, recurrent, chronic (HCC) Active Problems:   Essential hypertension   Hyperlipidemia   Acute kidney injury superimposed on chronic kidney disease (HCC)   Diabetes type 2, uncontrolled (HCC)   Hyponatremia   Iron deficiency anemia due to chronic blood loss   Pulmonary HTN (HCC)   Acute diastolic CHF (congestive heart failure) (HCC)  Acute Decompensation of Diastolic CHF (congestive Heart Failure) (HCC) -VQ scan was normal  -Right heart cath showed moderate pulmonary venous hypertension,  -2D echo showed an EF of 65-75% with grade 1 diastolic heart failure with right-sided showed only mildly dilated right ventricle. -Cardiology restarted diuretics with Furosemide 40 mg po Daily  -Right heart cath on 06/12/2016 showed pulmonary venous hypertension with an elevated filling pressure.. -Strict I's/O's, Daily Weights SLIV -Patient is -4.700 Liters and Weight is down 21 lbs -Cardiology increased Carvedilol to 25 mg po BID  -Continue to Monitor Volume Status closely; Patient appears Euvolemic today  -Cards recommending continuing po 40 mg Lasix today and follow up as an outpatient  -PT/OT Recommending SNF now but patient refusing   Pulmonary Hypertension -Right heart cath on the 28th showed a mixed picture -ECHO showed mildly dilated RV -Cardiology holding diuretics today  -Per Cardiology, treatment will be diuretics rather than selective pulmonary vasodilators at this point -Cardiology recommends continuing Lasix with 40 mg  -Follow up with Cardiology and Pulmonary as an outpatient   RSV Bronchitis -Patient's Respiratory  Virus Panel was Positive  -Added Azithromycin for Empiric Treatment x5 Days -Supportive Care with  DuoNeb q6hprn -Added Flutter Valve and Incentive Spirometry -Repeat CXR showed no evidence of Active Disease  -Repeat CXR as an outpatient   Nausea and Vomiting, and Abdominal Pain  -Added po/IV Zofran and Promethazine for Breakthrough Nausea/Vomiting -Discussed with patient about getting an Abdominal Scan and she adamantly refused 1/31 and agreeable today -CT Abd/Pelvis w/o Contrast showed 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 Interval development of abdominopelvic moderate ascites. Atrophic appearance of the pancreas and kidneys. Age-indeterminate L3 vertebral body grade 1 superior endplate fracture. -Discussed with Gastroenterology and with Interventional Radiology  -Diagnostic and Therapeutic Paracentesis cancelled by GI -GI Recommending no further workup for transient abdominal pain as symptoms improved today and low suspicion for SBP -GI Recommending having an EGD at some point to screen for Esophageal Varices  Acute kidney injury on chronic kidney disease stage IV -Baseline creatinine 1.8-1.9 -Creatinine has improved today and went from 2.35 -> 2.42 -> 2.24 -> 2.23 -> 2.34 -> 2.11 -> 2.07 -> 2.12 -Nephrology was consulted who agreed with IV diuresis now they have signed off.  -Cardiology changed IV diuresis to po Lasix and resumed 40 mg po daily and will continue at D/C  -Continue to monitor and repeat CMP in AM   Essential Hypertension -C/w Amlodipine 5 mg po BID, Clonidine 0.2 mg po 4 Times daily PRN SBP>170, Isosorbide Mononitrate 30 mg po Daily, and Hydralazine 75 mg po q8h (increased by Cardiology)  -Cardiology increased Coreg to 25 mg po BID -C/w Furosemide 40 mg po Daily per Cards   Hyperlipidemia -Lipid Panel showed Cholesterol of 166, HDL of 42, LDL 93, TG of 156, and VLDL of 31  -C/w Atorvastatin 80 mg po Daily  NormocyticAnemia -Decreased TIBC only, would favor due to chronic renal disease. -Hb/Hct went from 10.3/33.3 -> 9.9/32.2 ->  10.3/33.3 -> 9.7/31.8 -> 8.7/27.1 -> 9.4/29.5 -Continue to Monitor for S/Sx of Bleeding; No longer on Heparin gtt -C/w Ferrous Sulfate 325 mg po BIDwm -Follow-up with Nephrology as an outpatient.  Major Depressive Disorder: -Psychiatry was re-consulted due to patient continuing to be Depressed. I spoke with Dr. Darleene Cleaver today and he adjusted some of the patient's Medication's around -Dr. Darleene Cleaver decreased 0.5 mg Daily for Anxiety/Mood, increased Sertraline to 150 mg po Daily for Depression, and recommended increasing Trazodone to 100 mg qHS -Patient's Mood was better today   Rashes: Follow-up with PCP as an outpatient.  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 Interval development of abdominopelvic moderate ascites. Atrophic appearance of the pancreas and kidneys. -GI ordered Workup for Cirrhosis and Labs pending; Will need to follow up as an out patient   Hyperammonemia -Patient's Ammonia Level was 51 and worsened to 76; Now improved to 58 -Started Patient on Lactulose and will increase dose and D/C home with Lactulose -Repeat Ammonia Level as an outpatient   Hypokalemia -Patient's K+ was 3.1 and improved to 3.8 -Continue to Monitor and Replete as Necessary -Repeat CMP in AM  Elevated Troponin -Patient/s Troponin peaked at 1.08 -Cardiology stopped Heparin gtt yesterday  -C/w ASA 81 mg po Daily, Atorvastatin 80 mg po  Daily, and added Carvedilol increased to 25 mg po BID -Cardiology also adding Imdur 30 mg po Daily  -Per Cardiology Cath Cancelled and holding off Cath given Elevated Cr and can consider as an outpatient  -Possible Cath when Cr  improves  Tachyarrhythmias -Cardiology adding BB with Carvedilol and increased it to 25 mg BID   -Replete Electrolytes as necessary -Repeat EKG as an outpatient    Diabetes Mellitus Type 2 -CBG's ranging from 183-239 -C/w Insulin Detemir 20 units sq BID and Novolog 3 units TIDwm while hospitalized  -C/w Moderate Novolog SSI while hospitalized  -IF remains elevated will adjust as necessary  -Resume Home Regimen   Thrombocytopenia -Platelet Count was 98 today; Has been relatively stable -Likely related to Liver Cirrhosis and Platelet Consumption  -Repeat CBC as an Outpatient   Discharge Instructions  Discharge Instructions    (HEART FAILURE PATIENTS) Call MD:  Anytime you have any of the following symptoms: 1) 3 pound weight gain in 24 hours or 5 pounds in 1 week 2) shortness of breath, with or without a dry hacking cough 3) swelling in the hands, feet or stomach 4) if you have to sleep on extra pillows at night in order to breathe.   Complete by:  As directed    Call MD for:  difficulty breathing, headache or visual disturbances   Complete by:  As directed    Call MD for:  extreme fatigue   Complete by:  As directed    Call MD for:  hives   Complete by:  As directed    Call MD for:  persistant dizziness or light-headedness   Complete by:  As directed    Call MD for:  persistant nausea and vomiting   Complete by:  As directed    Call MD for:  redness, tenderness, or signs of infection (pain, swelling, redness, odor or green/yellow discharge around incision site)   Complete by:  As directed    Call MD for:  severe uncontrolled pain   Complete by:  As directed    Call MD for:  temperature >100.4   Complete by:  As directed    Diet - low sodium heart healthy   Complete by:  As directed    Diet - low sodium heart healthy   Complete by:  As directed    Discharge instructions   Complete by:  As directed    Follow up with PCP, Cardiology, Nephrology, Gastroenterology, and  Psychiatry as an outpatient. Take all medications as prescribed. If symptoms change or worsen please return to the ED for re-evaluation   Increase activity slowly   Complete by:  As directed    Increase activity slowly   Complete by:  As directed      Allergies as of 06/19/2017      Reactions   Phenytoin Sodium Extended Itching   Latex Swelling      Medication List    STOP taking these medications   POLYTRIM OP   pravastatin 80 MG tablet Commonly known as:  PRAVACHOL   torsemide 5 MG tablet Commonly known as:  DEMADEX     TAKE these medications   amLODipine 5 MG tablet Commonly known as:  NORVASC Take 5 mg by mouth 2 (two) times daily.   aspirin EC 81 MG tablet Take 81 mg by mouth daily.   atorvastatin 80 MG tablet Commonly known as:  LIPITOR Take 1 tablet (80 mg total) by mouth daily at 6 PM.   calcitRIOL  0.5 MCG capsule Commonly known as:  ROCALTROL Take 0.5 mcg by mouth See admin instructions. Take 1 capsule  0.104mcg every Monday and Friday   carvedilol 25 MG tablet Commonly known as:  COREG Take 1 tablet (25 mg total) by mouth 2 (two) times daily with a meal.   cholecalciferol 1000 units tablet Commonly known as:  VITAMIN D Take 1,000 Units by mouth 2 (two) times daily.   clonazePAM 0.5 MG tablet Commonly known as:  KLONOPIN Take 1 tablet (0.5 mg total) by mouth daily. Start taking on:  06/20/2017   cloNIDine 0.2 MG tablet Commonly known as:  CATAPRES Take 0.2 mg by mouth 4 (four) times daily as needed.   diclofenac sodium 1 % Gel Commonly known as:  VOLTAREN Apply 2 g topically 4 (four) times daily as needed (Pain).   docusate sodium 100 MG capsule Commonly known as:  COLACE Take 1 capsule (100 mg total) by mouth 2 (two) times daily. What changed:    how much to take  when to take this   feeding supplement (ENSURE ENLIVE) Liqd Take 237 mLs by mouth 3 (three) times daily between meals.   ferrous sulfate 325 (65 FE) MG tablet Take 325 mg by  mouth 2 (two) times daily with a meal.   furosemide 40 MG tablet Commonly known as:  LASIX Take 1 tablet (40 mg total) by mouth daily. Start taking on:  06/20/2017 What changed:    medication strength  how much to take   gabapentin 100 MG capsule Commonly known as:  NEURONTIN Take 100 mg by mouth 3 (three) times daily.   guaiFENesin 600 MG 12 hr tablet Commonly known as:  MUCINEX Take 2 tablets (1,200 mg total) by mouth 2 (two) times daily.   hydrALAZINE 25 MG tablet Commonly known as:  APRESOLINE Take 3 tablets (75 mg total) by mouth every 8 (eight) hours. What changed:    medication strength  how much to take  when to take this   insulin detemir 100 UNIT/ML injection Commonly known as:  LEVEMIR Inject 20-30 Units into the skin See admin instructions. Take 20-30 units twice daily depending on blood sugar reading   ipratropium-albuterol 0.5-2.5 (3) MG/3ML Soln Commonly known as:  DUONEB Take 3 mLs by nebulization every 6 (six) hours as needed (for shortness of breath).   isosorbide mononitrate 30 MG 24 hr tablet Commonly known as:  IMDUR Take 1 tablet (30 mg total) by mouth daily. Start taking on:  06/20/2017   lactulose 10 GM/15ML solution Commonly known as:  CHRONULAC Take 45 mLs (30 g total) by mouth 2 (two) times daily.   NOVOLOG FLEXPEN 100 UNIT/ML FlexPen Generic drug:  insulin aspart Inject 5 Units into the skin 3 (three) times daily with meals.   ondansetron 8 MG disintegrating tablet Commonly known as:  ZOFRAN-ODT Take 8 mg by mouth at bedtime.   oxyCODONE 5 MG immediate release tablet Commonly known as:  Oxy IR/ROXICODONE Take 5 mg by mouth every 6 (six) hours as needed for severe pain.   sertraline 50 MG tablet Commonly known as:  ZOLOFT Take 3 tablets (150 mg total) by mouth daily. Start taking on:  06/20/2017 What changed:    medication strength  how much to take   sodium chloride 0.65 % Soln nasal spray Commonly known as:  OCEAN Place 1  spray into both nostrils as needed for congestion.   traZODone 100 MG tablet Commonly known as:  DESYREL Take 1 tablet (100 mg  total) by mouth at bedtime. What changed:    medication strength  how much to take      Follow-up Information    [facility].   Specialty:  [place] Why:  [phone]. They will be in touch with you to schedule oyur first home visit 1-2 days after discharge.  Contact information: [address] [place] [address] [phone]        [facility]. Go on 06/26/2017.   Specialty:  Cardiology Why:  1:30 PM, [facility], parking code [place] information: [address] [id number] [place] [id number] [phone]       Jacqualine Code, DO. Go on 06/22/2017.   Specialty:  Family Medicine Why:  @12 :30pm Contact information: [address] [place] [address] [phone]          Allergies  Allergen Reactions  . Phenytoin Sodium Extended Itching  . Latex Swelling   Consultations:  Cardiology CHF Team  Psychiatry  Nephrology  Interventional Radiology  Gastroenterology  Procedures/Studies: Ct Abdomen Pelvis Wo Contrast  Result Date: 06/17/2017 CLINICAL DATA:  Abdominal pain, nausea vomiting. EXAM: CT ABDOMEN AND PELVIS WITHOUT CONTRAST TECHNIQUE: Multidetector CT imaging of the abdomen and pelvis was performed following the standard protocol without IV contrast. COMPARISON:  Abdominal ultrasound 06/11/2017 FINDINGS: Lower chest: Calcific atherosclerotic disease of the coronary arteries and aorta. Hepatobiliary: Cirrhotic appearance of the liver. Moderate perihepatic water density ascites. The the gallbladder is decompressed and therefore not well evaluated. Pancreas: Atrophic pancreas. Spleen: Mild splenomegaly. Adrenals/Urinary Tract: Bilateral cortical thinning. Evaluation for renal  masses precluded by lack of IV contrast. No evidence of hydronephrosis. Stomach/Bowel: Stomach is within normal limits. Post appendectomy. No evidence of bowel wall thickening, distention, or inflammatory changes. Vascular/Lymphatic: Aortic atherosclerosis. No enlarged abdominal or pelvic lymph nodes. Upper abdominal varices. Reproductive: Status post hysterectomy. No adnexal masses. Other: Moderate pelvic ascites. Musculoskeletal: Age-indeterminate grade 1 compression fracture of superior endplate of L3 vertebral body. Schmorl's nodes versus mild compression fracture of T11 superior endplate in E72 superior endplate. 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 Interval development of abdominopelvic ascites. Atrophic appearance of the pancreas and kidneys. Age-indeterminate L3 vertebral body grade 1 superior endplate fracture. Electronically Signed   By: Fidela Salisbury M.D.   On: 06/17/2017 12:13   Dg Chest 2 View  Result Date: 06/13/2017 CLINICAL DATA:  Hospital admission on 06/10/2017 due to pulmonary arterial hypertension. Acute onset of vomiting last night. EXAM: CHEST  2 VIEW COMPARISON:  06/10/2017, 01/25/2015 and earlier. FINDINGS: AP erect and lateral images were obtained. Suboptimal inspiration accounts for crowded bronchovascular markings, especially in the bases, and accentuates the cardiac silhouette. Taking this into account, cardiac silhouette upper normal in size to slightly enlarged. Thoracic aorta mildly atherosclerotic, unchanged. Hilar and mediastinal contours otherwise unremarkable. Improved central peribronchial thickening since the examination 3 days ago. Lungs clear. Bronchovascular markings now normal. Pulmonary vascularity normal. No visible pleural effusions. No pneumothorax. Slight exaggeration of the usual thoracic kyphosis may be in part positional. IMPRESSION: Suboptimal inspiration. Stable borderline heart size. No acute cardiopulmonary  disease. Electronically Signed   By: Evangeline Dakin M.D.   On: 06/13/2017 18:59   Dg Chest 2 View  Result Date: 06/10/2017 CLINICAL DATA:  Leg swelling and shortness of breath for 2 weeks, history CHF EXAM: CHEST  2 VIEW COMPARISON:  01/25/2015; interval chest radiographs listed on the time line  from 2017 are not available for comparison FINDINGS: Upper normal size of cardiac silhouette. Mediastinal contours and pulmonary vascularity normal. Bronchitic changes with mild RIGHT basilar atelectasis. Lungs otherwise clear. No infiltrate, pleural effusion or pneumothorax. Bones appear demineralized. IMPRESSION: Mild bronchitic changes and RIGHT basilar atelectasis. Electronically Signed   By: Lavonia Dana M.D.   On: 06/10/2017 11:26   Ct Head Wo Contrast  Result Date: 06/10/2017 CLINICAL DATA:  Unexplained altered level of consciousness, headache and dizziness since yesterday, history hypertension, seizures EXAM: CT HEAD WITHOUT CONTRAST TECHNIQUE: Contiguous axial images were obtained from the base of the skull through the vertex without intravenous contrast. Sagittal and coronal MPR images reconstructed from axial data set. COMPARISON:  01/22/2015 FINDINGS: Brain: Mild generalized atrophy. Normal ventricular morphology. No midline shift or mass effect. Old appearing BILATERAL basal ganglia lacunar infarcts. Mild small vessel chronic ischemic changes of deep cerebral white matter. No intracranial hemorrhage, mass lesion, or evidence of acute infarction. No extra-axial fluid collections. Vascular: Atherosclerotic calcification of internal carotid arteries and LEFT vertebral artery at skull base. Skull: Intact Sinuses/Orbits: Tiny mucosal retention cyst LEFT maxillary sinus. Otherwise clear. Other: N/A IMPRESSION: Atrophy with small vessel chronic ischemic changes of deep cerebral white matter. Old BILATERAL basal ganglia lacunar infarcts, though new on RIGHT since 2016. No acute intracranial abnormalities.  Electronically Signed   By: Lavonia Dana M.D.   On: 06/10/2017 12:22   US Renal  Result Date: 06/10/2017 CLINICAL DATA:  Patient with renal failure. EXAM: RENAL / URINARY TRACT ULTRASOUND COMPLETE COMPARISON:  None. FINDINGS: Right Kidney: Length: 11.4 cm. Mild pelviectasis. Kidney is lobular in contour with increased cortical echogenicity. Left Kidney: Not adequately visualized and therefore not assessed. Bladder: Appears normal for degree of bladder distention. IMPRESSION: 1. Mild right pelviectasis. 2. Echogenic and lobular right kidney as can be seen with chronic medical renal disease. 3. The left kidney is not adequately visualized and therefore not assessed. Electronically Signed   By: Lovey Newcomer M.D.   On: 06/10/2017 21:24   Nm Pulmonary Perf And Vent  Result Date: 06/11/2017 CLINICAL DATA:  Pulmonary hypertension question chronic pulmonary embolism EXAM: NUCLEAR MEDICINE VENTILATION - PERFUSION LUNG SCAN TECHNIQUE: Ventilation images were obtained in multiple projections using inhaled aerosol Tc-37m DTPA. Perfusion images were obtained in multiple projections after intravenous injection of Tc-34m MAA. RADIOPHARMACEUTICALS:  31.7 mCi of Tc-53m DTPA aerosol inhalation and 4.38 mCi Tc89m MAA-IV COMPARISON:  None Correlation: Chest radiograph 06/10/2017 FINDINGS: Ventilation: Central airway deposition of aerosol. Swallowed aerosol within stomach. No focal ventilatory defects. Perfusion: Normal IMPRESSION: Normal ventilation and perfusion lung scan. Electronically Signed   By: Lavonia Dana M.D.   On: 06/11/2017 14:02   Dg Chest Port 1 View  Result Date: 06/19/2017 CLINICAL DATA:  Shortness of Breath EXAM: PORTABLE CHEST 1 VIEW COMPARISON:  06/16/2017 FINDINGS: Cardiac shadow is stable. The lungs are well aerated bilaterally. No focal infiltrate or sizable effusion is seen. No bony abnormality is noted. IMPRESSION: No active disease. Electronically Signed   By: Inez Catalina M.D.   On: 06/19/2017 07:23    Dg Chest Port 1 View  Result Date: 06/16/2017 CLINICAL DATA:  Shortness of breath EXAM: PORTABLE CHEST 1 VIEW COMPARISON:  Two days ago FINDINGS: Stable borderline cardiomegaly. Stable mediastinal contours. There is no edema, consolidation, effusion, or pneumothorax. IMPRESSION: No evidence of active disease. Electronically Signed   By: Monte Fantasia M.D.   On: 06/16/2017 08:13   US Abdomen Limited Ruq  Result Date: 06/11/2017 CLINICAL  DATA:  History of cirrhosis. EXAM: ULTRASOUND ABDOMEN LIMITED RIGHT UPPER QUADRANT COMPARISON:  Abdominal ultrasound 10/31/2012. FINDINGS: Gallbladder: No gallstones or wall thickening visualized. No sonographic Murphy sign noted by sonographer. Common bile duct: Diameter: 0.2 cm Liver: No focal lesion. The liver border has a nodular appearance. Echotexture is coarsened. Portal vein is patent on color Doppler imaging with normal direction of blood flow towards the liver. IMPRESSION: Cirrhotic appearing liver.  No focal lesion.  No acute abnormality. Negative for gallstones. Electronically Signed   By: Inge Rise M.D.   On: 06/11/2017 14:49    ECHOCARDIOGRAM ------------------------------------------------------------------- Study Conclusions  - Left ventricle: The cavity size was normal. Wall thickness was increased in a pattern of mild LVH. Systolic function was vigorous. The estimated ejection fraction was in the range of 65% to 70%. Wall motion was normal; there were no regional wall motion abnormalities. Doppler parameters are consistent with abnormal left ventricular relaxation (grade 1 diastolic dysfunction). Doppler parameters are consistent with high ventricular filling pressure. - Aortic valve: There was mild stenosis. - Mitral valve: Calcified annulus. - Left atrium: The atrium was mildly dilated. - Right ventricle: The cavity size was mildly dilated. - Right atrium: The atrium was mildly dilated. - Pulmonary arteries:  Systolic pressure was moderately to severely increased. PA peak pressure: 60 mm Hg (S).  Impressions:  - Vigorous LV systolic function; mild diastolic dysfunction with elevated LV filling pressure; mild LVH; elevated LVOT gradient (mean gradient 17 mmHg) at least partially explained by vigorous LV function; aortic valve not well visualized but appears to open well; mild biatrial enlargement; mild RVE; mild TR with moderate to severe pulmonary hypertension.  RIGHT HEART CATHETERIZATION 1. Moderate pulmonary venous hypertension.  2. Elevated R > L heart filling pressures 3. Preserved cardiac output.  Subjective: Seen and examined at bedside and stated SOB and Abdominal Pain stable. No CP. Wanting to go home.  Discharge Exam: Vitals:   06/19/17 0413 06/19/17 1145  BP: (!) 148/47 (!) 127/43  Pulse: 75 67  Resp: 20 18  Temp: 98.4 F (36.9 C) (!) 97.5 F (36.4 C)  SpO2: 97% 97%   Vitals:   06/18/17 1221 06/18/17 1941 06/19/17 0413 06/19/17 1145  BP: (!) 127/44 (!) 142/45 (!) 148/47 (!) 127/43  Pulse: 65 70 75 67  Resp: 20 20 20 18   Temp: 98.2 F (36.8 C) 99.4 F (37.4 C) 98.4 F (36.9 C) (!) 97.5 F (36.4 C)  TempSrc: Oral Oral Oral Oral  SpO2: 94% 100% 97% 97%  Weight:   71.7 kg (158 lb)   Height:       General: Pt is alert, awake, not in acute distress Cardiovascular: RRR, S1/S2 +, no rubs, no gallops Respiratory: Diminished bilaterally, no wheezing, no rhonchi Abdominal: Soft, Mildly tender, ND, bowel sounds + Extremities: Trace edema, no cyanosis  The results of significant diagnostics from this hospitalization (including imaging, microbiology, ancillary and laboratory) are listed below for reference.    Microbiology: Recent Results (from the past 240 hour(s))  Culture, blood (routine x 2)     Status: None   Collection Time: 06/13/17 12:05 PM  Result Value Ref Range Status   Specimen Description BLOOD LEFT HAND  Final   Special Requests IN  PEDIATRIC BOTTLE Blood Culture adequate volume  Final   Culture   Final    NO GROWTH 5 DAYS Performed at [facility], [address], [place], [place] [address]    Report Status 06/18/2017 FINAL  Final  Culture, blood (routine x 2)     Status: None   Collection Time: 06/13/17 12:10 PM  Result Value Ref Range Status   Specimen Description BLOOD ARM LEFT  Final   Special Requests IN PEDIATRIC BOTTLE Blood Culture adequate volume  Final   Culture   Final    NO GROWTH 5 DAYS Performed at [facility], [address], [place], [place] [address]    Report Status 06/18/2017 FINAL  Final  Respiratory Panel by PCR     Status: Abnormal   Collection Time: 06/15/17 10:59 AM  Result Value Ref Range Status   Adenovirus NOT DETECTED NOT DETECTED Final   Coronavirus 229E NOT DETECTED NOT DETECTED Final   Coronavirus HKU1 NOT DETECTED NOT DETECTED Final   Coronavirus NL63 NOT DETECTED NOT DETECTED Final   Coronavirus OC43 NOT DETECTED NOT DETECTED Final   Metapneumovirus NOT DETECTED NOT DETECTED Final   Rhinovirus / Enterovirus NOT DETECTED NOT DETECTED Final   Influenza A NOT DETECTED NOT DETECTED Final   Influenza B NOT DETECTED NOT DETECTED Final   Parainfluenza Virus 1 NOT DETECTED NOT DETECTED Final   Parainfluenza Virus 2 NOT DETECTED NOT DETECTED Final   Parainfluenza Virus 3 NOT DETECTED NOT DETECTED Final   Parainfluenza Virus 4 NOT DETECTED NOT DETECTED Final   Respiratory Syncytial Virus DETECTED (A) NOT DETECTED Final    Comment: CRITICAL RESULT CALLED TO, READ BACK BY AND VERIFIED WITH: Clarene Essex RN 15:45 06/15/17 ([email or web address])    Bordetella pertussis NOT DETECTED NOT DETECTED Final   Chlamydophila pneumoniae NOT DETECTED NOT DETECTED Final   Mycoplasma pneumoniae NOT DETECTED NOT DETECTED Final    Labs: BNP (last 3 results) Recent Labs    06/10/17 1538 06/11/17 0632  BNP 394.7* 921.1*   Basic Metabolic Panel: Recent Labs  Lab 06/15/17 0600 06/16/17 0536  06/17/17 0453 06/18/17 0820 06/19/17 0612  NA 137 139 136 134* 135  K 3.8 3.5 3.1* 4.1 3.8  CL 102 103 100* 100* 101  CO2 24 23 25 24 22   GLUCOSE 155* 251* 185* 241* 194*  BUN 31* 32* 33* 32* 35*  CREATININE 2.23* 2.34* 2.11* 2.07* 2.12*  CALCIUM 8.7* 8.8* 8.8* 8.8* 8.8*  MG 2.1 2.2 2.3 2.3 2.3  PHOS 3.7 3.9 3.1 3.0 3.5   Liver Function Tests: Recent Labs  Lab 06/15/17 0600 06/16/17 0536 06/17/17 0453 06/18/17 0820 06/19/17 0612  AST 37 40 34 42* 46*  ALT 15 16 16 19 24   ALKPHOS 67 72 65 77 78  BILITOT 0.9 0.7 0.6 0.8 0.7  PROT 6.4* 6.2* 6.2* 5.9* 6.0*  ALBUMIN 2.8* 2.8* 2.7* 2.6* 2.7*   No results for input(s): LIPASE, AMYLASE in the last 168 hours. Recent Labs  Lab 06/16/17 0536 06/17/17 0453 06/18/17 0821 06/19/17 1051  AMMONIA 46* 51* 76* 58*   CBC: Recent Labs  Lab 06/15/17 0600 06/16/17 0536 06/17/17 0453 06/18/17 0820 06/19/17 0612  WBC 7.4 5.1 5.6 5.1 4.4  NEUTROABS 6.1 3.8 4.2 3.6 3.1  HGB 10.3* 9.7* 8.7* 9.4* 9.0*  HCT 33.3* 31.8* 27.1* 29.5* 28.8*  MCV 88.1 88.8 87.7 88.1 87.5  PLT 138* 117* 112* 110* 98*   Cardiac Enzymes: Recent Labs  Lab 06/13/17 1100 06/13/17 1726 06/13/17 2316 06/14/17 0618 06/14/17 1115  TROPONINI 0.11* 0.39* 0.89* 1.08* 0.98*   BNP: Invalid input(s): POCBNP CBG: Recent Labs  Lab 06/18/17 1121 06/18/17 1645 06/18/17 2135 06/19/17 0734 06/19/17 1109  GLUCAP 186* 168* 183* 201* 239*  D-Dimer No results for input(s): DDIMER in the last 72 hours. Hgb A1c No results for input(s): HGBA1C in the last 72 hours. Lipid Profile No results for input(s): CHOL, HDL, LDLCALC, TRIG, CHOLHDL, LDLDIRECT in the last 72 hours. Thyroid function studies No results for input(s): TSH, T4TOTAL, T3FREE, THYROIDAB in the last 72 hours.  Invalid input(s): FREET3 Anemia work up Recent Labs    06/19/17 0612  FERRITIN 108   Urinalysis    Component Value Date/Time   COLORURINE YELLOW 06/13/2017 1909   APPEARANCEUR  CLEAR 06/13/2017 1909   LABSPEC 1.012 06/13/2017 1909   PHURINE 6.0 06/13/2017 1909   GLUCOSEU 50 (A) 06/13/2017 1909   HGBUR NEGATIVE 06/13/2017 [place] NEGATIVE 06/13/2017 [place] NEGATIVE 06/13/2017 1909   PROTEINUR 100 (A) 06/13/2017 1909   NITRITE NEGATIVE 06/13/2017 1909   LEUKOCYTESUR NEGATIVE 06/13/2017 1909   Sepsis Labs Invalid input(s): PROCALCITONIN,  WBC,  LACTICIDVEN Microbiology Recent Results (from the past 240 hour(s))  Culture, blood (routine x 2)     Status: None   Collection Time: 06/13/17 12:05 PM  Result Value Ref Range Status   Specimen Description BLOOD LEFT HAND  Final   Special Requests IN PEDIATRIC BOTTLE Blood Culture adequate volume  Final   Culture   Final    NO GROWTH 5 DAYS Performed at [facility], [place] [address], [place], [place] [address]    Report Status 06/18/2017 FINAL  Final  Culture, blood (routine x 2)     Status: None   Collection Time: 06/13/17 12:10 PM  Result Value Ref Range Status   Specimen Description BLOOD ARM LEFT  Final   Special Requests IN PEDIATRIC BOTTLE Blood Culture adequate volume  Final   Culture   Final    NO GROWTH 5 DAYS Performed at [facility], [place] [address] [place], [place] [address]    Report Status 06/18/2017 FINAL  Final  Respiratory Panel by PCR     Status: Abnormal   Collection Time: 06/15/17 10:59 AM  Result Value Ref Range Status   Adenovirus NOT DETECTED NOT DETECTED Final   Coronavirus 229E NOT DETECTED NOT DETECTED Final   Coronavirus HKU1 NOT DETECTED NOT DETECTED Final   Coronavirus NL63 NOT DETECTED NOT DETECTED Final   Coronavirus OC43 NOT DETECTED NOT DETECTED Final   Metapneumovirus NOT DETECTED NOT DETECTED Final   Rhinovirus / Enterovirus NOT DETECTED NOT DETECTED Final   Influenza A NOT DETECTED NOT DETECTED Final   Influenza B NOT DETECTED NOT DETECTED Final   Parainfluenza Virus 1 NOT DETECTED NOT DETECTED Final   Parainfluenza Virus 2 NOT DETECTED  NOT DETECTED Final   Parainfluenza Virus 3 NOT DETECTED NOT DETECTED Final   Parainfluenza Virus 4 NOT DETECTED NOT DETECTED Final   Respiratory Syncytial Virus DETECTED (A) NOT DETECTED Final    Comment: CRITICAL RESULT CALLED TO, READ BACK BY AND VERIFIED WITH: Clarene Essex RN 15:45 06/15/17 ([email or web address])    Bordetella pertussis NOT DETECTED NOT DETECTED Final   Chlamydophila pneumoniae NOT DETECTED NOT DETECTED Final   Mycoplasma pneumoniae NOT DETECTED NOT DETECTED Final   Time coordinating discharge: 35 minutes  SIGNED:  Kerney Elbe, DO [facility] 06/19/2017, 10:04 PM Pager [phone]  If 7PM-7AM, please contact night-coverage [email or web address] Password [email or web address]

## 2017-06-19 NOTE — Progress Notes (Signed)
Progress Note   Subjective  Patient states she is feeling okay, wants to go home. She continues to diurese well with the lasix.    Objective   Vital signs in last 24 hours: Temp:  [98.2 F (36.8 C)-99.4 F (37.4 C)] 98.4 F (36.9 C) (02/04 0413) Pulse Rate:  [65-75] 75 (02/04 0413) Resp:  [20] 20 (02/04 0413) BP: (127-148)/(44-47) 148/47 (02/04 0413) SpO2:  [94 %-100 %] 97 % (02/04 0413) Weight:  [158 lb (71.7 kg)] 158 lb (71.7 kg) (02/04 0413) Last BM Date: 06/19/17 General:    white female in NAD Heart:  Regular rate and rhythm; (+) systolic murmur Lungs: Respirations even and unlabored, lungs with some scattered rhonchi bilaterally Abdomen:  Soft, nontender and nondistended.  Extremities:  Without edema in LE. Neurologic:  Alert and oriented,  grossly normal neurologically. Psych:  Cooperative. Normal mood and affect.  Intake/Output from previous day: 02/03 0701 - 02/04 0700 In: 700 [P.O.:700] Out: 400 [Urine:400] Intake/Output this shift: Total I/O In: 120 [P.O.:120] Out: -   Lab Results: Recent Labs    06/17/17 0453 06/18/17 0820 06/19/17 0612  WBC 5.6 5.1 4.4  HGB 8.7* 9.4* 9.0*  HCT 27.1* 29.5* 28.8*  PLT 112* 110* 98*   BMET Recent Labs    06/17/17 0453 06/18/17 0820 06/19/17 0612  NA 136 134* 135  K 3.1* 4.1 3.8  CL 100* 100* 101  CO2 25 24 22   GLUCOSE 185* 241* 194*  BUN 33* 32* 35*  CREATININE 2.11* 2.07* 2.12*  CALCIUM 8.8* 8.8* 8.8*   LFT Recent Labs    06/19/17 0612  PROT 6.0*  ALBUMIN 2.7*  AST 46*  ALT 24  ALKPHOS 78  BILITOT 0.7   PT/INR Recent Labs    06/17/17 0453 06/18/17 0820  LABPROT 14.4 14.6  INR 1.13 1.15    Studies/Results: Ct Abdomen Pelvis Wo Contrast  Result Date: 06/17/2017 CLINICAL DATA:  Abdominal pain, nausea vomiting. EXAM: CT ABDOMEN AND PELVIS WITHOUT CONTRAST TECHNIQUE: Multidetector CT imaging of the abdomen and pelvis was performed following the standard protocol without IV contrast.  COMPARISON:  Abdominal ultrasound 06/11/2017 FINDINGS: Lower chest: Calcific atherosclerotic disease of the coronary arteries and aorta. Hepatobiliary: Cirrhotic appearance of the liver. Moderate perihepatic water density ascites. The the gallbladder is decompressed and therefore not well evaluated. Pancreas: Atrophic pancreas. Spleen: Mild splenomegaly. Adrenals/Urinary Tract: Bilateral cortical thinning. Evaluation for renal masses precluded by lack of IV contrast. No evidence of hydronephrosis. Stomach/Bowel: Stomach is within normal limits. Post appendectomy. No evidence of bowel wall thickening, distention, or inflammatory changes. Vascular/Lymphatic: Aortic atherosclerosis. No enlarged abdominal or pelvic lymph nodes. Upper abdominal varices. Reproductive: Status post hysterectomy. No adnexal masses. Other: Moderate pelvic ascites. Musculoskeletal: Age-indeterminate grade 1 compression fracture of superior endplate of L3 vertebral body. Schmorl's nodes versus mild compression fracture of T11 superior endplate in H88 superior endplate. 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 Interval development of abdominopelvic ascites. Atrophic appearance of the pancreas and kidneys. Age-indeterminate L3 vertebral body grade 1 superior endplate fracture. Electronically Signed   By: Fidela Salisbury M.D.   On: 06/17/2017 12:13   Dg Chest Port 1 View  Result Date: 06/19/2017 CLINICAL DATA:  Shortness of Breath EXAM: PORTABLE CHEST 1 VIEW COMPARISON:  06/16/2017 FINDINGS: Cardiac shadow is stable. The lungs are well aerated bilaterally. No focal infiltrate or sizable effusion is seen. No bony abnormality is noted. IMPRESSION: No active disease. Electronically Signed   By: Elta Guadeloupe  Lukens M.D.   On: 06/19/2017 07:23       Assessment / Plan:   72 y/o female admitted with worsening CHF, also with severe pulmonary HTN, CKD, noted to have cirrhosis on imaging, which appears to be a  chronic diagnosis. Etiology could be cardiogenic in nature versus NASH. Labs pending to rule out chronic liver diseases. She has responded well to just lasix monotherapy and generally doing better from volume perspective. She does have intra-abdominal varices noted, and is on Coreg per cardiology which would treat any underlying gastro-esophageal varices.  Will await labs for now, would continue lasix monotherapy and defer to cardiology service regarding long term management of that dose. She should continue low salt diet and we will coordinate an office follow up with Dr. Ardis Hughs in 6 weeks or so. I don't think she warrants a screening EGD given she is already on Coreg, if that is to be continued.   Please call with questions, we will sign off at this time.  Brandenburg Cellar, MD [facility] Pager [phone]

## 2017-06-19 NOTE — Care Management Important Message (Signed)
Important Message  Patient Details  Name: Stacy Smith MRN: [id number] Date of Birth: Jul 01, 1945   Medicare Important Message Given:  Yes    Tachina Spoonemore P [place] 06/19/2017, 3:24 PM

## 2017-06-19 NOTE — Plan of Care (Signed)
  Progressing Safety: Ability to remain free from injury will improve 06/19/2017 0523 - Progressing by Ardine Eng, RN

## 2017-06-20 LAB — ANTINUCLEAR ANTIBODIES, IFA: ANTINUCLEAR ANTIBODIES, IFA: NEGATIVE

## 2017-06-20 LAB — HEPATITIS PANEL, ACUTE
HCV Ab: <0.1 s/co ratio (ref 0.0–0.9)
Hep A IgM: NEGATIVE
Hep B C IgM: NEGATIVE
Hepatitis B Surface Ag: NEGATIVE

## 2017-06-20 LAB — MITOCHONDRIAL ANTIBODIES: MITOCHONDRIAL M2 AB, IGG: 20.1 U — AB (ref 0.0–20.0)

## 2017-06-20 LAB — AFP TUMOR MARKER: AFP, SERUM, TUMOR MARKER: 4.6 ng/mL (ref 0.0–8.3)

## 2017-06-20 LAB — ANTI-SMOOTH MUSCLE ANTIBODY, IGG: F-ACTIN AB IGG: 12 U (ref 0–19)

## 2017-06-23 LAB — ALPHA-1 ANTITRYPSIN PHENOTYPE: A-1 Antitrypsin, Ser: 175 mg/dL (ref 90–200)

## 2017-06-26 ENCOUNTER — Encounter (HOSPITAL_COMMUNITY): Payer: Self-pay

## 2017-06-26 ENCOUNTER — Ambulatory Visit (HOSPITAL_COMMUNITY)
Admission: RE | Admit: 2017-06-26 | Discharge: 2017-06-26 | Disposition: A | Discharge status: Home / Self Care | Payer: Medicare HMO | Source: Ambulatory Visit | Attending: Internal Medicine | Admitting: Internal Medicine

## 2017-06-26 VITALS — BP 158/72 | HR 59 | Wt 161.4 lb

## 2017-06-26 DIAGNOSIS — N184 Chronic kidney disease, stage 4 (severe): Secondary | ICD-10-CM | POA: Diagnosis not present

## 2017-06-26 DIAGNOSIS — Z79891 Long term (current) use of opiate analgesic: Secondary | ICD-10-CM | POA: Diagnosis not present

## 2017-06-26 DIAGNOSIS — Z801 Family history of malignant neoplasm of trachea, bronchus and lung: Secondary | ICD-10-CM | POA: Insufficient documentation

## 2017-06-26 DIAGNOSIS — I272 Pulmonary hypertension, unspecified: Secondary | ICD-10-CM | POA: Diagnosis not present

## 2017-06-26 DIAGNOSIS — Z8249 Family history of ischemic heart disease and other diseases of the circulatory system: Secondary | ICD-10-CM | POA: Insufficient documentation

## 2017-06-26 DIAGNOSIS — I5033 Acute on chronic diastolic (congestive) heart failure: Secondary | ICD-10-CM | POA: Diagnosis not present

## 2017-06-26 DIAGNOSIS — I5032 Chronic diastolic (congestive) heart failure: Secondary | ICD-10-CM | POA: Diagnosis not present

## 2017-06-26 DIAGNOSIS — I2729 Other secondary pulmonary hypertension: Secondary | ICD-10-CM

## 2017-06-26 DIAGNOSIS — K7460 Unspecified cirrhosis of liver: Secondary | ICD-10-CM | POA: Diagnosis not present

## 2017-06-26 DIAGNOSIS — F329 Major depressive disorder, single episode, unspecified: Secondary | ICD-10-CM | POA: Diagnosis not present

## 2017-06-26 DIAGNOSIS — E1122 Type 2 diabetes mellitus with diabetic chronic kidney disease: Secondary | ICD-10-CM | POA: Diagnosis not present

## 2017-06-26 DIAGNOSIS — Z7982 Long term (current) use of aspirin: Secondary | ICD-10-CM | POA: Diagnosis not present

## 2017-06-26 DIAGNOSIS — I130 Hypertensive heart and chronic kidney disease with heart failure and stage 1 through stage 4 chronic kidney disease, or unspecified chronic kidney disease: Secondary | ICD-10-CM | POA: Insufficient documentation

## 2017-06-26 DIAGNOSIS — Z808 Family history of malignant neoplasm of other organs or systems: Secondary | ICD-10-CM | POA: Insufficient documentation

## 2017-06-26 DIAGNOSIS — Z79899 Other long term (current) drug therapy: Secondary | ICD-10-CM | POA: Diagnosis not present

## 2017-06-26 DIAGNOSIS — N183 Chronic kidney disease, stage 3 unspecified: Secondary | ICD-10-CM

## 2017-06-26 DIAGNOSIS — I10 Essential (primary) hypertension: Secondary | ICD-10-CM

## 2017-06-26 DIAGNOSIS — J069 Acute upper respiratory infection, unspecified: Secondary | ICD-10-CM | POA: Insufficient documentation

## 2017-06-26 DIAGNOSIS — Z794 Long term (current) use of insulin: Secondary | ICD-10-CM | POA: Diagnosis not present

## 2017-06-26 DIAGNOSIS — I50810 Right heart failure, unspecified: Secondary | ICD-10-CM

## 2017-06-26 LAB — BASIC METABOLIC PANEL
Anion gap: 11 (ref 5–15)
BUN: 37 mg/dL — ABNORMAL HIGH (ref 6–20)
CO2: 22 mmol/L (ref 22–32)
Calcium: 8.6 mg/dL — ABNORMAL LOW (ref 8.9–10.3)
Chloride: 101 mmol/L (ref 101–111)
Creatinine, Ser: 2.51 mg/dL — ABNORMAL HIGH (ref 0.44–1.00)
GFR calc Af Amer: 21 mL/min — ABNORMAL LOW (ref >60)
GFR calc non Af Amer: 18 mL/min — ABNORMAL LOW (ref >60)
GLUCOSE: 93 mg/dL (ref 65–99)
POTASSIUM: 5.1 mmol/L (ref 3.5–5.1)
Sodium: 134 mmol/L — ABNORMAL LOW (ref 135–145)

## 2017-06-26 LAB — BRAIN NATRIURETIC PEPTIDE: B Natriuretic Peptide: 792.7 pg/mL — ABNORMAL HIGH (ref 0.0–100.0)

## 2017-06-26 MED ORDER — FUROSEMIDE 40 MG PO TABS: 40.0000 mg | ORAL_TABLET | Freq: Every day | ORAL | 0 refills | Status: DC

## 2017-06-26 MED ORDER — POTASSIUM CHLORIDE CRYS ER 20 MEQ PO TBCR: 20.0000 meq | EXTENDED_RELEASE_TABLET | Freq: Every day | ORAL | 11 refills | Status: DC

## 2017-06-26 MED ORDER — FUROSEMIDE 40 MG PO TABS: 40.0000 mg | ORAL_TABLET | Freq: Every day | ORAL | 11 refills | Status: DC

## 2017-06-26 NOTE — Progress Notes (Signed)
PCP: Dr Elvina Mattes [name], [place]) Primary Cardiologist: Dr Aundra Dubin   HPI: 72 yo with history of CKD stage IV, diastolic CHF, HTN, cirrhosis, DM, depression, and pulmonary hypertension   Admitted 9/56-2/1 with A/C diastolic HF. Echo showed EF 65-75% with grade 1 DD and mildly dilated RV. RHC showed pulm HTN with PA 61/27. She was diuresed with IV lasix. Coreg and hydralazine were increased. GI consulted for abdominal pain with N/V. CT abd showed liver cirrhosis and abdominal varices. No paracentesis was done. She started lactulose for elevated ammonia and will follow up with GI. She had some CP while vomiting with troponin peak of 1.08, which was thought to be demand ischemia. No cath with CKD. No further CP. Discharge weight 158 pounds.   She presents today for hospital follow up. Her main complaint today  productive cough with clear sputum. No fever or sweats. Her weights at home are 158-160 lbs. She has not taken any extra lasix. BPs at home 150-170's. No CP, SOB, PND, or dizziness. She always sleeps on 2-3 pillows. She is getting around the house with a walker. She avoids salty foods and drinks less than 2L/day. Taking all her meds as prescribed. Her daughter and son-in-law are staying with her since discharge. Home health has not started yet.   RHC (1/18): mean RA 8, PA 78/25 mean 45, mean PCWP 18, CI 3.6, PVR 4.6 WU Echo (9/18): EF 60-65%, normal RV systolic function, PASP 58, mild TR and MR.  Echo (1/19): EF 65-70%, mild aortic stenosis, mildly dilated RV normal systolic function, PASP 60 mmHg.  V/Q scan (1/19): No PE Abdominal US (1/19): Liver appears cirrhotic. No gallstones  RHC Procedural Findings (06/12/17): Hemodynamics (mmHg) RA mean 13 RV 65/15 PA 61/27, mean 39 PCWP mean 20 Oxygen saturations: PA 80% AO 97% Cardiac Output (Fick) 10.1  Cardiac Index (Fick) 5.87 PVR 1.9 WU Cardiac Output (Thermo) 9.23 Cardiac Output (Thermo) 5.34 PVR 2 WU   ROS: All systems negative  except as listed in HPI, PMH and Problem List.  SH:  Social History   Socioeconomic History  . Marital status: Widowed    Spouse name: Not on file  . Number of children: Not on file  . Years of education: Not on file  . Highest education level: Not on file  Social Needs  . Financial resource strain: Not on file  . Food insecurity - worry: Not on file  . Food insecurity - inability: Not on file  . Transportation needs - medical: Not on file  . Transportation needs - non-medical: Not on file  Occupational History  . Not on file  Tobacco Use  . Smoking status: Never Smoker  . Smokeless tobacco: Never Used  Substance and Sexual Activity  . Alcohol use: No    Alcohol/week: 0.0 oz  . Drug use: No  . Sexual activity: Not on file  Other Topics Concern  . Not on file  Social History Narrative  . Not on file    FH:  Family History  Problem Relation Age of Onset  . Heart failure Mother   . Liver cancer Father   . Lung cancer Father   . Cancer Sister   . Heart murmur Brother     Past Medical History:  Diagnosis Date  . Anxiety   . Bradycardia   . CHF (congestive heart failure) ([place])   . Chronic kidney disease    CKD Stage 3  . Depression   . Diabetes mellitus without complication ([place])   .  Hyperkalemia   . Hypertension   . Lupus   . Peripheral neuropathy   . Rheumatoid arthritis ([place])   . Seizures ([place])   . Shortness of breath dyspnea     Current Outpatient Medications  Medication Sig Dispense Refill  . amLODipine (NORVASC) 5 MG tablet Take 5 mg by mouth 2 (two) times daily.     Marland Kitchen aspirin EC 81 MG tablet Take 81 mg by mouth daily.    Marland Kitchen atorvastatin (LIPITOR) 80 MG tablet Take 1 tablet (80 mg total) by mouth daily at 6 PM. 30 tablet 0  . calcitRIOL (ROCALTROL) 0.5 MCG capsule Take 0.5 mcg by mouth See admin instructions. Take 1 capsule  0.52mcg every Monday and Friday    . carvedilol (COREG) 25 MG tablet Take 1 tablet (25 mg total) by mouth 2 (two) times daily  with a meal. 60 tablet 0  . cholecalciferol (VITAMIN D) 1000 units tablet Take 1,000 Units by mouth 2 (two) times daily.    . clonazePAM (KLONOPIN) 0.5 MG tablet Take 1 tablet (0.5 mg total) by mouth daily. 30 tablet 0  . cloNIDine (CATAPRES) 0.2 MG tablet Take 0.2 mg by mouth 4 (four) times daily as needed.     . diclofenac sodium (VOLTAREN) 1 % GEL Apply 2 g topically 4 (four) times daily as needed (Pain). 1 Tube 0  . docusate sodium (COLACE) 100 MG capsule Take 1 capsule (100 mg total) by mouth 2 (two) times daily. (Patient taking differently: Take 200 mg by mouth at bedtime. ) 10 capsule 0  . feeding supplement, ENSURE ENLIVE, (ENSURE ENLIVE) LIQD Take 237 mLs by mouth 3 (three) times daily between meals. 237 mL 12  . ferrous sulfate 325 (65 FE) MG tablet Take 325 mg by mouth 2 (two) times daily with a meal.     . furosemide (LASIX) 40 MG tablet Take 1 tablet (40 mg total) by mouth daily. 30 tablet 0  . gabapentin (NEURONTIN) 100 MG capsule Take 100 mg by mouth 3 (three) times daily.    Marland Kitchen guaiFENesin (MUCINEX) 600 MG 12 hr tablet Take 2 tablets (1,200 mg total) by mouth 2 (two) times daily. 20 tablet 0  . hydrALAZINE (APRESOLINE) 25 MG tablet Take 3 tablets (75 mg total) by mouth every 8 (eight) hours. 90 tablet 0  . insulin aspart (NOVOLOG FLEXPEN) 100 UNIT/ML FlexPen Inject 5 Units into the skin 3 (three) times daily with meals.    . insulin detemir (LEVEMIR) 100 UNIT/ML injection Inject 20-30 Units into the skin See admin instructions. Take 20-30 units twice daily depending on blood sugar reading    . ipratropium-albuterol (DUONEB) 0.5-2.5 (3) MG/3ML SOLN Take 3 mLs by nebulization every 6 (six) hours as needed (for shortness of breath).     . isosorbide mononitrate (IMDUR) 30 MG 24 hr tablet Take 1 tablet (30 mg total) by mouth daily. 30 tablet 0  . lactulose (CHRONULAC) 10 GM/15ML solution Take 45 mLs (30 g total) by mouth 2 (two) times daily. 240 mL 0  . ondansetron (ZOFRAN-ODT) 8 MG  disintegrating tablet Take 8 mg by mouth at bedtime.     Marland Kitchen oxyCODONE (OXY IR/ROXICODONE) 5 MG immediate release tablet Take 5 mg by mouth every 6 (six) hours as needed for severe pain.    Marland Kitchen sertraline (ZOLOFT) 50 MG tablet Take 3 tablets (150 mg total) by mouth daily. 90 tablet 0  . sodium chloride (OCEAN) 0.65 % SOLN nasal spray Place 1 spray into both nostrils as  needed for congestion. 1 Bottle 0  . traZODone (DESYREL) 100 MG tablet Take 1 tablet (100 mg total) by mouth at bedtime. 30 tablet 0   No current facility-administered medications for this encounter.     Vitals:   06/26/17 1334  BP: (!) 158/72  Pulse: (!) 59  SpO2: 92%  Weight: 161 lb 6.4 oz (73.2 kg)    PHYSICAL EXAM:  Walked slowly into clinic with her brother and walker. General:   No resp difficulty HEENT: normal Neck: supple. JVP ~12. Carotids 2+ bilaterally; no bruits. No lymphadenopathy or thryomegaly appreciated. Cor: PMI normal. Regular rate & rhythm. No rubs, gallops or murmurs. Lungs: wheezing throughout Abdomen: soft, nontender, nondistended. No hepatosplenomegaly. No bruits or masses. Good bowel sounds. Extremities: no cyanosis, clubbing, rash, +2 edema BLE Neuro: alert & orientedx3, cranial nerves grossly intact. Moves all 4 extremities w/o difficulty. Affect pleasant.      ASSESSMENT & PLAN:  1. A/C diastolic CHF: Echo 2/87: EF 65-70%, mildly dilated RV. 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 [place] 06/12/17, however, showed pulmonary venous hypertension and elevated filling pressures with PVR about 2 WU.   - NYHA II-III (difficult to assess due to limited activity). Volume status elevated. - Increase lasix to 80 mg daily and add 20 meq potassium daily. - Check BMET today - Continue hydralazine 75 mg TID and imdur 30 mg daily - Continue coreg 25 mg BID - Continue amlodipine 5 mg BID - Consider cardiomems Reinforced -->Do  the following things EVERYDAY: 1) Weigh yourself in the morning before breakfast. Write it down and keep it in a log. 2) Take your medicines as prescribed 3) Eat low salt foods-Limit salt (sodium) to 2000 mg per day.  4) Stay as active as you can everyday 5) Limit all fluids for the day to less than 2 liters  2. Pulmonary HTN - Continue treating with diuretics  3. CKD stage 3-4, baseline creatinine 1.8-1.9 - BMET today - Follows with nephrology in [place]  4. HTN - SBP 150-170's at home - Treat with HF meds as above + clonidine - Increasing diuretic regimen today - Consider increasing hydralazine if still elevated at follow up  5. Cirrhosis - Following up with GI - Continue lactulose. Having regular BM's. No confusion.  6. URI: productive cough with clear sputum - Sees PCP tomorrow - Some of this may be fluid overload, but was also RSV+ during admission - No fever  7. Depression - Continue zoloft - Sees PCP tomorrow. Also following up with psych outpatient  8. Hx of elevated troponin (05/2017) thought to be elevated in the setting of demand ischemia. Not a cath candidate due to CKD. Peaked at 1.08. No further CP.  - No s/s ischemia - Continue ASA, statin, BB  9. Hx of SVT  - No palpitations - Continue coreg  10. Deconditioning - Walking around house with walker.  - HH PT, OT, and RN have not started yet  Follow up in 1 week to reassess volume status. Check BMET/BNP now.   Dev Dhondt NP-C 2:39 PM

## 2017-06-26 NOTE — Patient Instructions (Addendum)
INCREASE Lasix to 80 mg once daily.  START Potassium 20 meq (1 tab) once daily. Take with lasix.  Deep breath and cough every hour.  Follow up next week with Dr. Aundra Dubin.  Take all medication as prescribed the day of your appointment. Bring all medications with you to your appointment.  Do the following things EVERYDAY: 1) Weigh yourself in the morning before breakfast. Write it down and keep it in a log. 2) Take your medicines as prescribed 3) Eat low salt foods-Limit salt (sodium) to 2000 mg per day.  4) Stay as active as you can everyday 5) Limit all fluids for the day to less than 2 liters

## 2017-07-07 ENCOUNTER — Encounter (HOSPITAL_COMMUNITY): Payer: Self-pay | Admitting: Cardiology

## 2017-07-07 ENCOUNTER — Other Ambulatory Visit (HOSPITAL_COMMUNITY): Payer: Self-pay

## 2017-07-07 ENCOUNTER — Ambulatory Visit (HOSPITAL_COMMUNITY)
Admission: RE | Admit: 2017-07-07 | Discharge: 2017-07-07 | Disposition: A | Discharge status: Home / Self Care | Payer: Medicare HMO | Source: Ambulatory Visit | Attending: Cardiology | Admitting: Cardiology

## 2017-07-07 VITALS — BP 138/45 | HR 60 | Ht 60.0 in | Wt 157.8 lb

## 2017-07-07 DIAGNOSIS — I5032 Chronic diastolic (congestive) heart failure: Secondary | ICD-10-CM | POA: Insufficient documentation

## 2017-07-07 DIAGNOSIS — K7460 Unspecified cirrhosis of liver: Secondary | ICD-10-CM | POA: Diagnosis not present

## 2017-07-07 DIAGNOSIS — I50810 Right heart failure, unspecified: Secondary | ICD-10-CM

## 2017-07-07 DIAGNOSIS — Z7982 Long term (current) use of aspirin: Secondary | ICD-10-CM | POA: Insufficient documentation

## 2017-07-07 DIAGNOSIS — N183 Chronic kidney disease, stage 3 unspecified: Secondary | ICD-10-CM

## 2017-07-07 DIAGNOSIS — E1122 Type 2 diabetes mellitus with diabetic chronic kidney disease: Secondary | ICD-10-CM | POA: Insufficient documentation

## 2017-07-07 DIAGNOSIS — Z808 Family history of malignant neoplasm of other organs or systems: Secondary | ICD-10-CM | POA: Insufficient documentation

## 2017-07-07 DIAGNOSIS — I2729 Other secondary pulmonary hypertension: Secondary | ICD-10-CM | POA: Diagnosis not present

## 2017-07-07 DIAGNOSIS — Z794 Long term (current) use of insulin: Secondary | ICD-10-CM | POA: Diagnosis not present

## 2017-07-07 DIAGNOSIS — Z79899 Other long term (current) drug therapy: Secondary | ICD-10-CM | POA: Insufficient documentation

## 2017-07-07 DIAGNOSIS — I130 Hypertensive heart and chronic kidney disease with heart failure and stage 1 through stage 4 chronic kidney disease, or unspecified chronic kidney disease: Secondary | ICD-10-CM | POA: Insufficient documentation

## 2017-07-07 DIAGNOSIS — I272 Pulmonary hypertension, unspecified: Secondary | ICD-10-CM | POA: Insufficient documentation

## 2017-07-07 DIAGNOSIS — Z8249 Family history of ischemic heart disease and other diseases of the circulatory system: Secondary | ICD-10-CM | POA: Insufficient documentation

## 2017-07-07 DIAGNOSIS — F329 Major depressive disorder, single episode, unspecified: Secondary | ICD-10-CM | POA: Insufficient documentation

## 2017-07-07 LAB — CBC
HCT: 26.8 % — ABNORMAL LOW (ref 36.0–46.0)
HEMOGLOBIN: 8.2 g/dL — AB (ref 12.0–15.0)
MCH: 26.7 pg (ref 26.0–34.0)
MCHC: 30.6 g/dL (ref 30.0–36.0)
MCV: 87.3 fL (ref 78.0–100.0)
PLATELETS: 110 10*3/uL — AB (ref 150–400)
RBC: 3.07 MIL/uL — AB (ref 3.87–5.11)
RDW: 16.2 % — ABNORMAL HIGH (ref 11.5–15.5)
WBC: 4.6 10*3/uL (ref 4.0–10.5)

## 2017-07-07 LAB — COMPREHENSIVE METABOLIC PANEL
ALT: 15 U/L (ref 14–54)
ANION GAP: 12 (ref 5–15)
AST: 28 U/L (ref 15–41)
Albumin: 3.1 g/dL — ABNORMAL LOW (ref 3.5–5.0)
Alkaline Phosphatase: 67 U/L (ref 38–126)
BUN: 33 mg/dL — ABNORMAL HIGH (ref 6–20)
CALCIUM: 8.8 mg/dL — AB (ref 8.9–10.3)
CHLORIDE: 106 mmol/L (ref 101–111)
CO2: 19 mmol/L — AB (ref 22–32)
Creatinine, Ser: 2.39 mg/dL — ABNORMAL HIGH (ref 0.44–1.00)
GFR, EST AFRICAN AMERICAN: 22 mL/min — AB (ref >60)
GFR, EST NON AFRICAN AMERICAN: 19 mL/min — AB (ref >60)
Glucose, Bld: 162 mg/dL — ABNORMAL HIGH (ref 65–99)
Potassium: 4.7 mmol/L (ref 3.5–5.1)
SODIUM: 137 mmol/L (ref 135–145)
Total Bilirubin: 0.6 mg/dL (ref 0.3–1.2)
Total Protein: 6.6 g/dL (ref 6.5–8.1)

## 2017-07-07 LAB — AMMONIA: AMMONIA: 44 umol/L — AB (ref 9–35)

## 2017-07-07 MED ORDER — FUROSEMIDE 80 MG PO TABS: ORAL_TABLET | ORAL | 3 refills | Status: DC

## 2017-07-07 MED ORDER — POTASSIUM CHLORIDE CRYS ER 20 MEQ PO TBCR: 20.0000 meq | EXTENDED_RELEASE_TABLET | Freq: Two times a day (BID) | ORAL | 11 refills | Status: DC

## 2017-07-07 NOTE — Patient Instructions (Signed)
Increase Furosemide 80 mg (1 tab) in AM, then 40 mg (1/2 tab) in PM   Increase Potassium 20 meq (1 tab), twice a day  Rx for Compression stockings given please wear during the day  Labs drawn today (if we do not call you, then your lab work was stable)   Your physician recommends that you return for lab work in: 1 week Rx given   Your physician recommends that you schedule a follow-up appointment in: 2 weeks in APP clinic

## 2017-07-09 NOTE — Progress Notes (Signed)
PCP: Dr Elvina Mattes ([name], [place]) HF Cardiology: Dr Aundra Dubin   HPI: 72 y.o. with history of CKD stage IV, diastolic CHF, HTN, cirrhosis, DM, depression, and pulmonary hypertension presents for followup of CHF.   Admitted 1/26-06/19/16 with acute on chronic diastolic HF. Echo (1/18) showed EF 65-70% with grade 1 DD and mildly dilated RV. RHC showed pulmonary venous hypertension. She was diuresed with IV lasix. Coreg and hydralazine were increased. GI consulted for abdominal pain with N/V. CT abdomen showed hepatic cirrhosis. She started lactulose for elevated ammonia. She had some CP while vomiting with troponin peak of 1.08, which was thought to be demand ischemia. No cath with CKD. No further CP. Discharge weight 158 pounds.   She returns today for followup of CHF. Daughter is living with her now.  No chest pain.  She can walk about 100 feet before getting short of breath.  She walks with a walker outside the house, uses a cane at home.  She has used 2 pillows for a long time, no PND.   ECG (personally reviewed): NSR, nonspecific T wave abnormalities.   Labs (2/19): K 5.1, creatinine 2.51, BNP 793  PMH: 1. Chronic diastolic CHF:  - RHC (2/13): mean RA 8, PA 78/25 mean 45, mean PCWP 18, CI 3.6, PVR 4.6 WU - Echo (9/18): EF 60-65%, normal RV systolic function, PASP 58, mild TR and MR.  - Echo (1/19): EF 65-70%, mild aortic stenosis, mildly dilated RV normal systolic function, PASP 60 mmHg.  - RHC (1/19): mean RA 13, PA 61/27, mean PCWP 20, CI 5.87 Fick (PVR 1.9 WU), CI 5.34 Thermo (PVR 2 WU).  2. Pulmonary hypertension: RHC in 1/19 suggested pulmonary venous hypertension.  - V/Q scan (1/19): No chronic or acute PE 3. Cirrhosis: Abdominal US (1/19): Liver appears cirrhotic. No gallstones.  - HBV/HCV workup negative.  - ANA negative, ASMA negative.  4. Depression 5. CKD stage 3.  6. Diabetes mellitus 7. HTN 8. Rheumatoid arthritis  ROS: All systems negative except as listed in HPI, PMH and  Problem List.  Social History   Socioeconomic History  . Marital status: Widowed    Spouse name: Not on file  . Number of children: Not on file  . Years of education: Not on file  . Highest education level: Not on file  Social Needs  . Financial resource strain: Not on file  . Food insecurity - worry: Not on file  . Food insecurity - inability: Not on file  . Transportation needs - medical: Not on file  . Transportation needs - non-medical: Not on file  Occupational History  . Not on file  Tobacco Use  . Smoking status: Never Smoker  . Smokeless tobacco: Never Used  Substance and Sexual Activity  . Alcohol use: No    Alcohol/week: 0.0 oz  . Drug use: No  . Sexual activity: Not on file  Other Topics Concern  . Not on file  Social History Narrative  . Not on file   Family History  Problem Relation Age of Onset  . Heart failure Mother   . Liver cancer Father   . Lung cancer Father   . Cancer Sister   . Heart murmur Brother     Current Outpatient Medications  Medication Sig Dispense Refill  . amLODipine (NORVASC) 5 MG tablet Take 5 mg by mouth 2 (two) times daily.     Marland Kitchen aspirin EC 81 MG tablet Take 81 mg by mouth daily.    Marland Kitchen atorvastatin (LIPITOR)  80 MG tablet Take 1 tablet (80 mg total) by mouth daily at 6 PM. 30 tablet 0  . calcitRIOL (ROCALTROL) 0.5 MCG capsule Take 0.5 mcg by mouth See admin instructions. Take 1 capsule  0.53mcg every Monday and Friday    . carvedilol (COREG) 25 MG tablet Take 1 tablet (25 mg total) by mouth 2 (two) times daily with a meal. 60 tablet 0  . cholecalciferol (VITAMIN D) 1000 units tablet Take 1,000 Units by mouth 2 (two) times daily.    . clonazePAM (KLONOPIN) 0.5 MG tablet Take 1 tablet (0.5 mg total) by mouth daily. 30 tablet 0  . cloNIDine (CATAPRES) 0.2 MG tablet Take 0.2 mg by mouth 4 (four) times daily as needed.     . diclofenac sodium (VOLTAREN) 1 % GEL Apply 2 g topically 4 (four) times daily as needed (Pain). 1 Tube 0  .  docusate sodium (COLACE) 100 MG capsule Take 1 capsule (100 mg total) by mouth 2 (two) times daily. (Patient taking differently: Take 200 mg by mouth at bedtime. ) 10 capsule 0  . feeding supplement, ENSURE ENLIVE, (ENSURE ENLIVE) LIQD Take 237 mLs by mouth 3 (three) times daily between meals. 237 mL 12  . ferrous sulfate 325 (65 FE) MG tablet Take 325 mg by mouth 2 (two) times daily with a meal.     . furosemide (LASIX) 80 MG tablet Take 1 tablet (80 mg total) by mouth every morning AND 0.5 tablets (40 mg total) every evening. 45 tablet 3  . gabapentin (NEURONTIN) 100 MG capsule Take 100 mg by mouth 3 (three) times daily.    Marland Kitchen guaiFENesin (MUCINEX) 600 MG 12 hr tablet Take 2 tablets (1,200 mg total) by mouth 2 (two) times daily. 20 tablet 0  . hydrALAZINE (APRESOLINE) 25 MG tablet Take 3 tablets (75 mg total) by mouth every 8 (eight) hours. 90 tablet 0  . insulin aspart (NOVOLOG FLEXPEN) 100 UNIT/ML FlexPen Inject 5 Units into the skin 3 (three) times daily with meals.    . insulin detemir (LEVEMIR) 100 UNIT/ML injection Inject 20-30 Units into the skin See admin instructions. Take 20-30 units twice daily depending on blood sugar reading    . ipratropium-albuterol (DUONEB) 0.5-2.5 (3) MG/3ML SOLN Take 3 mLs by nebulization every 6 (six) hours as needed (for shortness of breath).     . isosorbide mononitrate (IMDUR) 30 MG 24 hr tablet Take 1 tablet (30 mg total) by mouth daily. 30 tablet 0  . lactulose (CHRONULAC) 10 GM/15ML solution Take 45 mLs (30 g total) by mouth 2 (two) times daily. 240 mL 0  . ondansetron (ZOFRAN-ODT) 8 MG disintegrating tablet Take 8 mg by mouth at bedtime.     Marland Kitchen oxyCODONE (OXY IR/ROXICODONE) 5 MG immediate release tablet Take 5 mg by mouth every 6 (six) hours as needed for severe pain.    . potassium chloride SA (K-DUR,KLOR-CON) 20 MEQ tablet Take 1 tablet (20 mEq total) by mouth 2 (two) times daily. 60 tablet 11  . sertraline (ZOLOFT) 50 MG tablet Take 3 tablets (150 mg  total) by mouth daily. 90 tablet 0  . sodium chloride (OCEAN) 0.65 % SOLN nasal spray Place 1 spray into both nostrils as needed for congestion. 1 Bottle 0  . traZODone (DESYREL) 100 MG tablet Take 1 tablet (100 mg total) by mouth at bedtime. 30 tablet 0   No current facility-administered medications for this encounter.     Vitals:   07/07/17 1356  BP: (!) 138/45  Pulse: 60  Weight: 157 lb 12.8 oz (71.6 kg)  Height: 5' (1.524 m)    PHYSICAL EXAM:   General: NAD Neck: JVP 10-11 cm, no thyromegaly or thyroid nodule.  Lungs: Clear to auscultation bilaterally with normal respiratory effort. CV: Nondisplaced PMI.  Heart regular S1/S2, no S3/S4, no murmur. 2+ edema to knees bilaterally.  No carotid bruit.  Normal pedal pulses.  Abdomen: Soft, nontender, no hepatosplenomegaly, no distention.  Skin: Intact without lesions or rashes.  Neurologic: Alert and oriented x 3.  Psych: Normal affect. Extremities: No clubbing or cyanosis.  HEENT: Normal.   ASSESSMENT & PLAN: 1. Chronic diastolic CHF: Echo 8/50: EF 65-70%, mildly dilated RV. 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 [place] 06/12/17, however, showed pulmonary venous hypertension and elevated filling pressures with PVR about 2 WU.   She is volume overloaded today, NYHA class III symptoms.   - Increase Lasix to 80 qam/40 qpm.  Increase KCl to 20 bid.  BMET today and again in 2 wks. - I will look into CardioMEMS for Mrs Carns. Would be reasonable to place to help manage volume overload if insurance will cover. - Wear graded compression stockings.  2. Pulmonary HTN: Pulmonary venous hypertension based on most recent RHC.  - Continue diuresis.  - No role for selective pulmonary vasodilators at this point.  3. CKD stage 3: BMET today. Follows with nephrology in [place].  4. HTN: BP controlled on current regimen.  5. Cirrhosis: Negative workup for viral and  autoimmune hepatitis.  She does not drink ETOH.  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  - NH3 today. 6. Depression: Continue Zoloft.   Followup with me or PA/NP in 2 wks.   Stacy Smith 07/09/2017

## 2017-07-14 ENCOUNTER — Other Ambulatory Visit (HOSPITAL_COMMUNITY)
Admission: RE | Admit: 2017-07-14 | Discharge: 2017-07-14 | Disposition: A | Discharge status: Home / Self Care | Payer: Medicare HMO | Source: Ambulatory Visit | Attending: Physician Assistant | Admitting: Physician Assistant

## 2017-07-14 ENCOUNTER — Other Ambulatory Visit (HOSPITAL_COMMUNITY)
Admission: RE | Admit: 2017-07-14 | Discharge: 2017-07-14 | Disposition: A | Discharge status: Home / Self Care | Payer: Medicare HMO | Source: Ambulatory Visit | Attending: Cardiology | Admitting: Cardiology

## 2017-07-14 ENCOUNTER — Other Ambulatory Visit (HOSPITAL_COMMUNITY): Payer: Self-pay

## 2017-07-14 DIAGNOSIS — E876 Hypokalemia: Secondary | ICD-10-CM | POA: Insufficient documentation

## 2017-07-14 DIAGNOSIS — I5032 Chronic diastolic (congestive) heart failure: Secondary | ICD-10-CM | POA: Diagnosis present

## 2017-07-14 DIAGNOSIS — K7460 Unspecified cirrhosis of liver: Secondary | ICD-10-CM | POA: Insufficient documentation

## 2017-07-14 LAB — BASIC METABOLIC PANEL
ANION GAP: 12 (ref 5–15)
BUN: 46 mg/dL — ABNORMAL HIGH (ref 6–20)
CHLORIDE: 102 mmol/L (ref 101–111)
CO2: 23 mmol/L (ref 22–32)
CREATININE: 2.99 mg/dL — AB (ref 0.44–1.00)
Calcium: 8.7 mg/dL — ABNORMAL LOW (ref 8.9–10.3)
GFR calc non Af Amer: 15 mL/min — ABNORMAL LOW (ref >60)
GFR, EST AFRICAN AMERICAN: 17 mL/min — AB (ref >60)
Glucose, Bld: 230 mg/dL — ABNORMAL HIGH (ref 65–99)
POTASSIUM: 4.2 mmol/L (ref 3.5–5.1)
SODIUM: 137 mmol/L (ref 135–145)

## 2017-07-14 LAB — POTASSIUM: POTASSIUM: 4.2 mmol/L (ref 3.5–5.1)

## 2017-07-14 LAB — AMMONIA: AMMONIA: 37 umol/L — AB (ref 9–35)

## 2017-07-14 MED ORDER — FUROSEMIDE 80 MG PO TABS: 80.0000 mg | ORAL_TABLET | ORAL | 3 refills | Status: DC

## 2017-07-20 ENCOUNTER — Encounter (HOSPITAL_COMMUNITY): Payer: Self-pay

## 2017-07-20 ENCOUNTER — Ambulatory Visit (HOSPITAL_COMMUNITY)
Admission: RE | Admit: 2017-07-20 | Discharge: 2017-07-20 | Disposition: A | Discharge status: Home / Self Care | Payer: Medicare HMO | Source: Ambulatory Visit | Attending: Internal Medicine | Admitting: Internal Medicine

## 2017-07-20 VITALS — BP 144/52 | HR 61 | Wt 159.8 lb

## 2017-07-20 DIAGNOSIS — I130 Hypertensive heart and chronic kidney disease with heart failure and stage 1 through stage 4 chronic kidney disease, or unspecified chronic kidney disease: Secondary | ICD-10-CM | POA: Diagnosis not present

## 2017-07-20 DIAGNOSIS — I50810 Right heart failure, unspecified: Secondary | ICD-10-CM | POA: Diagnosis not present

## 2017-07-20 DIAGNOSIS — Z800 Family history of malignant neoplasm of digestive organs: Secondary | ICD-10-CM | POA: Diagnosis not present

## 2017-07-20 DIAGNOSIS — Z794 Long term (current) use of insulin: Secondary | ICD-10-CM | POA: Insufficient documentation

## 2017-07-20 DIAGNOSIS — N179 Acute kidney failure, unspecified: Secondary | ICD-10-CM | POA: Diagnosis not present

## 2017-07-20 DIAGNOSIS — Z8249 Family history of ischemic heart disease and other diseases of the circulatory system: Secondary | ICD-10-CM | POA: Diagnosis not present

## 2017-07-20 DIAGNOSIS — Z7982 Long term (current) use of aspirin: Secondary | ICD-10-CM | POA: Insufficient documentation

## 2017-07-20 DIAGNOSIS — M069 Rheumatoid arthritis, unspecified: Secondary | ICD-10-CM | POA: Insufficient documentation

## 2017-07-20 DIAGNOSIS — I5032 Chronic diastolic (congestive) heart failure: Secondary | ICD-10-CM

## 2017-07-20 DIAGNOSIS — I272 Pulmonary hypertension, unspecified: Secondary | ICD-10-CM | POA: Insufficient documentation

## 2017-07-20 DIAGNOSIS — N183 Chronic kidney disease, stage 3 (moderate): Secondary | ICD-10-CM | POA: Insufficient documentation

## 2017-07-20 DIAGNOSIS — E1122 Type 2 diabetes mellitus with diabetic chronic kidney disease: Secondary | ICD-10-CM | POA: Insufficient documentation

## 2017-07-20 DIAGNOSIS — K7460 Unspecified cirrhosis of liver: Secondary | ICD-10-CM | POA: Diagnosis not present

## 2017-07-20 DIAGNOSIS — K744 Secondary biliary cirrhosis: Secondary | ICD-10-CM | POA: Diagnosis not present

## 2017-07-20 DIAGNOSIS — I2729 Other secondary pulmonary hypertension: Secondary | ICD-10-CM | POA: Diagnosis not present

## 2017-07-20 DIAGNOSIS — F329 Major depressive disorder, single episode, unspecified: Secondary | ICD-10-CM | POA: Insufficient documentation

## 2017-07-20 DIAGNOSIS — Z79899 Other long term (current) drug therapy: Secondary | ICD-10-CM | POA: Diagnosis not present

## 2017-07-20 DIAGNOSIS — N189 Chronic kidney disease, unspecified: Secondary | ICD-10-CM

## 2017-07-20 DIAGNOSIS — Z801 Family history of malignant neoplasm of trachea, bronchus and lung: Secondary | ICD-10-CM | POA: Diagnosis not present

## 2017-07-20 LAB — BASIC METABOLIC PANEL
Anion gap: 12 (ref 5–15)
BUN: 41 mg/dL — AB (ref 6–20)
CALCIUM: 9 mg/dL (ref 8.9–10.3)
CO2: 22 mmol/L (ref 22–32)
Chloride: 103 mmol/L (ref 101–111)
Creatinine, Ser: 2.55 mg/dL — ABNORMAL HIGH (ref 0.44–1.00)
GFR calc Af Amer: 21 mL/min — ABNORMAL LOW (ref >60)
GFR, EST NON AFRICAN AMERICAN: 18 mL/min — AB (ref >60)
GLUCOSE: 173 mg/dL — AB (ref 65–99)
Potassium: 4 mmol/L (ref 3.5–5.1)
Sodium: 137 mmol/L (ref 135–145)

## 2017-07-20 LAB — MAGNESIUM: Magnesium: 2.3 mg/dL (ref 1.7–2.4)

## 2017-07-20 NOTE — Progress Notes (Signed)
Advanced Heart Failure Clinic Note   PCP: Dr Elvina Mattes ([name], [place]) HF Cardiology: Dr Aundra Dubin   HPI: 72 y.o. with history of CKD stage IV, diastolic CHF, HTN, cirrhosis, DM, depression, and pulmonary hypertension presents for followup of CHF.   Admitted 1/26-06/19/16 with acute on chronic diastolic HF. Echo (1/18) showed EF 65-70% with grade 1 DD and mildly dilated RV. RHC showed pulmonary venous hypertension. She was diuresed with IV lasix. Coreg and hydralazine were increased. GI consulted for abdominal pain with N/V. CT abdomen showed hepatic cirrhosis. She started lactulose for elevated ammonia. She had some CP while vomiting with troponin peak of 1.08, which was thought to be demand ischemia. No cath with CKD. No further CP. Discharge weight 158 pounds.   She presents today for follow up. Last visit lasix increased, but had to be brought back down due to AKI.  She had episode of hypoglycemia 07/13/17 requiring emergency response. She refuses transport to hospital.  She has held her potassium since then, as she had an episode of "potassium poisoning" in the past.  She feels like she is getting around better. Using a walker and more steady on her feet. Chronic 2 pillow orthopnea. Denies lightheadedness or dizziness. No CP or palpitations.   Labs (2/19): K 5.1, creatinine 2.51, BNP 793 Labs 07/14/17 K 4.2, Creatinine 2.99, Ammonia 37.  PMH: 1. Chronic diastolic CHF:  - RHC (1/61): mean RA 8, PA 78/25 mean 45, mean PCWP 18, CI 3.6, PVR 4.6 WU - Echo (9/18): EF 60-65%, normal RV systolic function, PASP 58, mild TR and MR.  - Echo (1/19): EF 65-70%, mild aortic stenosis, mildly dilated RV normal systolic function, PASP 60 mmHg.  - RHC (1/19): mean RA 13, PA 61/27, mean PCWP 20, CI 5.87 Fick (PVR 1.9 WU), CI 5.34 Thermo (PVR 2 WU).  2. Pulmonary hypertension: RHC in 1/19 suggested pulmonary venous hypertension.  - V/Q scan (1/19): No chronic or acute PE 3. Cirrhosis: Abdominal US (1/19): Liver  appears cirrhotic. No gallstones.  - HBV/HCV workup negative.  - ANA negative, ASMA negative.  4. Depression 5. CKD stage 3.  6. Diabetes mellitus 7. HTN 8. Rheumatoid arthritis  Review of systems complete and found to be negative unless listed in HPI.    Social History   Socioeconomic History  . Marital status: Widowed    Spouse name: Not on file  . Number of children: Not on file  . Years of education: Not on file  . Highest education level: Not on file  Social Needs  . Financial resource strain: Not on file  . Food insecurity - worry: Not on file  . Food insecurity - inability: Not on file  . Transportation needs - medical: Not on file  . Transportation needs - non-medical: Not on file  Occupational History  . Not on file  Tobacco Use  . Smoking status: Never Smoker  . Smokeless tobacco: Never Used  Substance and Sexual Activity  . Alcohol use: No    Alcohol/week: 0.0 oz  . Drug use: No  . Sexual activity: Not on file  Other Topics Concern  . Not on file  Social History Narrative  . Not on file   Family History  Problem Relation Age of Onset  . Heart failure Mother   . Liver cancer Father   . Lung cancer Father   . Cancer Sister   . Heart murmur Brother     Current Outpatient Medications  Medication Sig Dispense Refill  .  amLODipine (NORVASC) 5 MG tablet Take 5 mg by mouth 2 (two) times daily.     Marland Kitchen aspirin EC 81 MG tablet Take 81 mg by mouth daily.    Marland Kitchen atorvastatin (LIPITOR) 80 MG tablet Take 1 tablet (80 mg total) by mouth daily at 6 PM. 30 tablet 0  . calcitRIOL (ROCALTROL) 0.5 MCG capsule Take 0.5 mcg by mouth See admin instructions. Take 1 capsule  0.2mcg every Monday and Friday    . carvedilol (COREG) 25 MG tablet Take 1 tablet (25 mg total) by mouth 2 (two) times daily with a meal. 60 tablet 0  . cholecalciferol (VITAMIN D) 1000 units tablet Take 1,000 Units by mouth 2 (two) times daily.    . clonazePAM (KLONOPIN) 0.5 MG tablet Take 1 tablet (0.5 mg  total) by mouth daily. 30 tablet 0  . cloNIDine (CATAPRES) 0.2 MG tablet Take 0.2 mg by mouth 4 (four) times daily as needed.     . diclofenac sodium (VOLTAREN) 1 % GEL Apply 2 g topically 4 (four) times daily as needed (Pain). 1 Tube 0  . docusate sodium (COLACE) 100 MG capsule Take 1 capsule (100 mg total) by mouth 2 (two) times daily. (Patient taking differently: Take 200 mg by mouth at bedtime. ) 10 capsule 0  . feeding supplement, ENSURE ENLIVE, (ENSURE ENLIVE) LIQD Take 237 mLs by mouth 3 (three) times daily between meals. 237 mL 12  . ferrous sulfate 325 (65 FE) MG tablet Take 325 mg by mouth 2 (two) times daily with a meal.     . furosemide (LASIX) 80 MG tablet Take 1 tablet (80 mg total) by mouth every morning. 30 tablet 3  . gabapentin (NEURONTIN) 100 MG capsule Take 100 mg by mouth 3 (three) times daily.    Marland Kitchen guaiFENesin (MUCINEX) 600 MG 12 hr tablet Take 2 tablets (1,200 mg total) by mouth 2 (two) times daily. 20 tablet 0  . hydrALAZINE (APRESOLINE) 25 MG tablet Take 3 tablets (75 mg total) by mouth every 8 (eight) hours. 90 tablet 0  . insulin aspart (NOVOLOG FLEXPEN) 100 UNIT/ML FlexPen Inject 5 Units into the skin 3 (three) times daily with meals.    . insulin detemir (LEVEMIR) 100 UNIT/ML injection Inject 20-30 Units into the skin See admin instructions. Take 20-30 units twice daily depending on blood sugar reading    . ipratropium-albuterol (DUONEB) 0.5-2.5 (3) MG/3ML SOLN Take 3 mLs by nebulization every 6 (six) hours as needed (for shortness of breath).     . isosorbide mononitrate (IMDUR) 30 MG 24 hr tablet Take 1 tablet (30 mg total) by mouth daily. 30 tablet 0  . lactulose (CHRONULAC) 10 GM/15ML solution Take 45 mLs (30 g total) by mouth 2 (two) times daily. 240 mL 0  . ondansetron (ZOFRAN-ODT) 8 MG disintegrating tablet Take 8 mg by mouth at bedtime.     Marland Kitchen oxyCODONE (OXY IR/ROXICODONE) 5 MG immediate release tablet Take 5 mg by mouth every 6 (six) hours as needed for severe  pain.    . potassium chloride SA (K-DUR,KLOR-CON) 20 MEQ tablet Take 1 tablet (20 mEq total) by mouth 2 (two) times daily. 60 tablet 11  . sertraline (ZOLOFT) 50 MG tablet Take 3 tablets (150 mg total) by mouth daily. 90 tablet 0  . sodium chloride (OCEAN) 0.65 % SOLN nasal spray Place 1 spray into both nostrils as needed for congestion. 1 Bottle 0  . traZODone (DESYREL) 100 MG tablet Take 1 tablet (100 mg total) by mouth  at bedtime. 30 tablet 0   No current facility-administered medications for this encounter.     Vitals:   07/20/17 1344  BP: (!) 144/52  Pulse: 61  SpO2: 97%  Weight: 159 lb 12.8 oz (72.5 kg)   Wt Readings from Last 3 Encounters:  07/20/17 159 lb 12.8 oz (72.5 kg)  07/07/17 157 lb 12.8 oz (71.6 kg)  06/26/17 161 lb 6.4 oz (73.2 kg)     PHYSICAL EXAM:    General: Elderly appearing. NAD.  HEENT: Normal Neck: Supple. JVP 6-7 cm. Carotids 2+ bilat; no bruits. No thyromegaly or nodule noted. Cor: PMI nondisplaced. RRR, No M/G/R noted Lungs: CTAB, normal effort. Abdomen: Soft, non-tender, non-distended, no HSM. No bruits or masses. +BS  Extremities: No cyanosis, clubbing, or rash. Trace ankle edema at most.  Neuro: Alert & orientedx3, cranial nerves grossly intact. moves all 4 extremities w/o difficulty. Affect pleasant   ASSESSMENT & PLAN: 1. Chronic diastolic CHF: Echo 6/30: EF 65-70%, mildly dilated RV. [place] in 1/18 with severe pulmonary hypertension, likely mixed pulmonary venous and pulmonary arterial HTN with PVR 4.6 WU. [place] 06/12/17, however, showed pulmonary venous hypertension and elevated filling pressures with PVR about 2 WU.   - Volume status stable on exam today.  - NYHA class III symptoms chronically - Continue lasix 80 mg daily. BMET today.  - Will address K based on BMET.  - Looking at [facility] for Mrs Peplinski.  - Wear graded compression stockings.  - Reinforced fluid restriction to < 2 L daily, sodium restriction to less than 2000 mg daily, and the  importance of daily weights.   2. Pulmonary HTN: Pulmonary venous hypertension based on most recent RHC.  - Continue diuresis. No change. - No role for selective pulmonary vasodilators at this point.  3. AKI on CKD stage 3:  - BMET today.  - Follows with nephrology in [place]. Will send labs from today.  4. HTN:  - Mildly elevated today.  5. Cirrhosis: Negative workup for viral and autoimmune hepatitis.  She does not drink ETOH.  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  - Most recent Ammonia 37.  6. Depression:  - Continue Zoloft. Per PCP.   Doing better. Fluid status stable. Labs today. RTC 4 weeks for follow up.   Shirley Friar, PA-C  07/20/2017   Greater than 50% of the 25 minute visit was spent in counseling/coordination of care regarding disease state education, salt/fluid restriction, sliding scale diuretics, and medication compliance.

## 2017-07-20 NOTE — Patient Instructions (Signed)
Routine lab work today. Will notify you of abnormal results, otherwise no news is good news!  Follow up 4 weeks with Oda Kilts PA-C.  _____________________________________________________________ Stacy Smith Code:  Take all medication as prescribed the day of your appointment. Bring all medications with you to your appointment.  Do the following things EVERYDAY: 1) Weigh yourself in the morning before breakfast. Write it down and keep it in a log. 2) Take your medicines as prescribed 3) Eat low salt foods-Limit salt (sodium) to 2000 mg per day.  4) Stay as active as you can everyday 5) Limit all fluids for the day to less than 2 liters

## 2017-07-31 ENCOUNTER — Telehealth (HOSPITAL_COMMUNITY): Payer: Self-pay | Admitting: *Deleted

## 2017-07-31 NOTE — Telephone Encounter (Signed)
  OK to take an extra lasix this evening.    Legrand Como [address] [place], PA-C 07/31/2017 12:05 PM

## 2017-07-31 NOTE — Telephone Encounter (Signed)
Advanced Heart Failure Triage Encounter  Patient Name: Stacy Smith  Date of Call: 07/31/17  Problem:  Patient's brother called stating she had a 3lb wt gain overnight and has bilateral pitting edema in her ankles/feet. No shortness of breath but feels tired.    Plan:  Will forward to Glennie Hawk to review.   Darron Doom, RN

## 2017-07-31 NOTE — Telephone Encounter (Signed)
Called patient directly and spoke with daughter, she will have her take an extra lasix this afternoon.

## 2017-08-06 ENCOUNTER — Telehealth: Payer: Self-pay | Admitting: Medical

## 2017-08-06 ENCOUNTER — Inpatient Hospital Stay (HOSPITAL_COMMUNITY): Payer: Medicare HMO

## 2017-08-06 ENCOUNTER — Other Ambulatory Visit: Payer: Self-pay

## 2017-08-06 ENCOUNTER — Emergency Department (HOSPITAL_COMMUNITY): Payer: Medicare HMO

## 2017-08-06 ENCOUNTER — Inpatient Hospital Stay (HOSPITAL_COMMUNITY)
Admission: EM | Admit: 2017-08-06 | Discharge: 2017-08-11 | DRG: 291 | Disposition: A | Discharge status: Home / Self Care | Payer: Medicare HMO | Attending: Internal Medicine | Admitting: Internal Medicine

## 2017-08-06 ENCOUNTER — Encounter (HOSPITAL_COMMUNITY): Payer: Self-pay | Admitting: Emergency Medicine

## 2017-08-06 DIAGNOSIS — M329 Systemic lupus erythematosus, unspecified: Secondary | ICD-10-CM | POA: Diagnosis present

## 2017-08-06 DIAGNOSIS — R569 Unspecified convulsions: Secondary | ICD-10-CM | POA: Diagnosis present

## 2017-08-06 DIAGNOSIS — Z90710 Acquired absence of both cervix and uterus: Secondary | ICD-10-CM | POA: Diagnosis not present

## 2017-08-06 DIAGNOSIS — F419 Anxiety disorder, unspecified: Secondary | ICD-10-CM | POA: Diagnosis present

## 2017-08-06 DIAGNOSIS — I2510 Atherosclerotic heart disease of native coronary artery without angina pectoris: Secondary | ICD-10-CM | POA: Diagnosis present

## 2017-08-06 DIAGNOSIS — R601 Generalized edema: Secondary | ICD-10-CM | POA: Diagnosis present

## 2017-08-06 DIAGNOSIS — Z91040 Latex allergy status: Secondary | ICD-10-CM | POA: Diagnosis not present

## 2017-08-06 DIAGNOSIS — D61818 Other pancytopenia: Secondary | ICD-10-CM | POA: Diagnosis present

## 2017-08-06 DIAGNOSIS — N179 Acute kidney failure, unspecified: Secondary | ICD-10-CM

## 2017-08-06 DIAGNOSIS — Z7982 Long term (current) use of aspirin: Secondary | ICD-10-CM | POA: Diagnosis not present

## 2017-08-06 DIAGNOSIS — R1013 Epigastric pain: Secondary | ICD-10-CM

## 2017-08-06 DIAGNOSIS — N170 Acute kidney failure with tubular necrosis: Secondary | ICD-10-CM | POA: Diagnosis not present

## 2017-08-06 DIAGNOSIS — Z800 Family history of malignant neoplasm of digestive organs: Secondary | ICD-10-CM

## 2017-08-06 DIAGNOSIS — I130 Hypertensive heart and chronic kidney disease with heart failure and stage 1 through stage 4 chronic kidney disease, or unspecified chronic kidney disease: Secondary | ICD-10-CM | POA: Diagnosis present

## 2017-08-06 DIAGNOSIS — Z888 Allergy status to other drugs, medicaments and biological substances status: Secondary | ICD-10-CM

## 2017-08-06 DIAGNOSIS — I5032 Chronic diastolic (congestive) heart failure: Secondary | ICD-10-CM | POA: Diagnosis not present

## 2017-08-06 DIAGNOSIS — D631 Anemia in chronic kidney disease: Secondary | ICD-10-CM | POA: Diagnosis present

## 2017-08-06 DIAGNOSIS — E785 Hyperlipidemia, unspecified: Secondary | ICD-10-CM | POA: Diagnosis present

## 2017-08-06 DIAGNOSIS — J9621 Acute and chronic respiratory failure with hypoxia: Secondary | ICD-10-CM | POA: Diagnosis present

## 2017-08-06 DIAGNOSIS — I361 Nonrheumatic tricuspid (valve) insufficiency: Secondary | ICD-10-CM | POA: Diagnosis not present

## 2017-08-06 DIAGNOSIS — N184 Chronic kidney disease, stage 4 (severe): Secondary | ICD-10-CM | POA: Diagnosis present

## 2017-08-06 DIAGNOSIS — F329 Major depressive disorder, single episode, unspecified: Secondary | ICD-10-CM | POA: Diagnosis present

## 2017-08-06 DIAGNOSIS — Z801 Family history of malignant neoplasm of trachea, bronchus and lung: Secondary | ICD-10-CM

## 2017-08-06 DIAGNOSIS — K7460 Unspecified cirrhosis of liver: Secondary | ICD-10-CM | POA: Diagnosis present

## 2017-08-06 DIAGNOSIS — E1121 Type 2 diabetes mellitus with diabetic nephropathy: Secondary | ICD-10-CM | POA: Diagnosis present

## 2017-08-06 DIAGNOSIS — N189 Chronic kidney disease, unspecified: Secondary | ICD-10-CM

## 2017-08-06 DIAGNOSIS — G8929 Other chronic pain: Secondary | ICD-10-CM | POA: Diagnosis present

## 2017-08-06 DIAGNOSIS — Z8249 Family history of ischemic heart disease and other diseases of the circulatory system: Secondary | ICD-10-CM

## 2017-08-06 DIAGNOSIS — Z794 Long term (current) use of insulin: Secondary | ICD-10-CM

## 2017-08-06 DIAGNOSIS — J449 Chronic obstructive pulmonary disease, unspecified: Secondary | ICD-10-CM | POA: Diagnosis present

## 2017-08-06 DIAGNOSIS — I5030 Unspecified diastolic (congestive) heart failure: Secondary | ICD-10-CM | POA: Diagnosis present

## 2017-08-06 DIAGNOSIS — I10 Essential (primary) hypertension: Secondary | ICD-10-CM

## 2017-08-06 DIAGNOSIS — K5900 Constipation, unspecified: Secondary | ICD-10-CM | POA: Diagnosis present

## 2017-08-06 DIAGNOSIS — M069 Rheumatoid arthritis, unspecified: Secondary | ICD-10-CM | POA: Diagnosis present

## 2017-08-06 DIAGNOSIS — I5033 Acute on chronic diastolic (congestive) heart failure: Secondary | ICD-10-CM | POA: Diagnosis present

## 2017-08-06 DIAGNOSIS — K760 Fatty (change of) liver, not elsewhere classified: Secondary | ICD-10-CM | POA: Diagnosis present

## 2017-08-06 DIAGNOSIS — I272 Pulmonary hypertension, unspecified: Secondary | ICD-10-CM | POA: Diagnosis present

## 2017-08-06 DIAGNOSIS — E1122 Type 2 diabetes mellitus with diabetic chronic kidney disease: Secondary | ICD-10-CM | POA: Diagnosis present

## 2017-08-06 DIAGNOSIS — E119 Type 2 diabetes mellitus without complications: Secondary | ICD-10-CM

## 2017-08-06 DIAGNOSIS — E611 Iron deficiency: Secondary | ICD-10-CM | POA: Diagnosis present

## 2017-08-06 LAB — SODIUM, URINE, RANDOM: SODIUM UR: 21 mmol/L

## 2017-08-06 LAB — BASIC METABOLIC PANEL
Anion gap: 11 (ref 5–15)
BUN: 53 mg/dL — ABNORMAL HIGH (ref 6–20)
CO2: 22 mmol/L (ref 22–32)
CREATININE: 3.75 mg/dL — AB (ref 0.44–1.00)
Calcium: 8.8 mg/dL — ABNORMAL LOW (ref 8.9–10.3)
Chloride: 102 mmol/L (ref 101–111)
GFR calc non Af Amer: 11 mL/min — ABNORMAL LOW (ref >60)
GFR, EST AFRICAN AMERICAN: 13 mL/min — AB (ref >60)
Glucose, Bld: 122 mg/dL — ABNORMAL HIGH (ref 65–99)
POTASSIUM: 4.3 mmol/L (ref 3.5–5.1)
Sodium: 135 mmol/L (ref 135–145)

## 2017-08-06 LAB — URINALYSIS, ROUTINE W REFLEX MICROSCOPIC
BILIRUBIN URINE: NEGATIVE
Bacteria, UA: NONE SEEN
GLUCOSE, UA: NEGATIVE mg/dL
HGB URINE DIPSTICK: NEGATIVE
KETONES UR: NEGATIVE mg/dL
Leukocytes, UA: NEGATIVE
Nitrite: NEGATIVE
Protein, ur: 100 mg/dL — AB
Specific Gravity, Urine: 1.008 (ref 1.005–1.030)
pH: 5 (ref 5.0–8.0)

## 2017-08-06 LAB — I-STAT TROPONIN, ED
TROPONIN I, POC: 0.03 ng/mL (ref 0.00–0.08)
Troponin i, poc: 0.01 ng/mL (ref 0.00–0.08)

## 2017-08-06 LAB — CBC
HEMATOCRIT: 27.2 % — AB (ref 36.0–46.0)
HEMOGLOBIN: 8.5 g/dL — AB (ref 12.0–15.0)
MCH: 28.5 pg (ref 26.0–34.0)
MCHC: 31.3 g/dL (ref 30.0–36.0)
MCV: 91.3 fL (ref 78.0–100.0)
PLATELETS: 111 10*3/uL — AB (ref 150–400)
RBC: 2.98 MIL/uL — AB (ref 3.87–5.11)
RDW: 18 % — ABNORMAL HIGH (ref 11.5–15.5)
WBC: 3.7 10*3/uL — ABNORMAL LOW (ref 4.0–10.5)

## 2017-08-06 LAB — TROPONIN I

## 2017-08-06 LAB — MAGNESIUM: MAGNESIUM: 2.6 mg/dL — AB (ref 1.7–2.4)

## 2017-08-06 LAB — PHOSPHORUS: PHOSPHORUS: 4.3 mg/dL (ref 2.5–4.6)

## 2017-08-06 LAB — BRAIN NATRIURETIC PEPTIDE: B NATRIURETIC PEPTIDE 5: 953.7 pg/mL — AB (ref 0.0–100.0)

## 2017-08-06 MED ORDER — INSULIN ASPART 100 UNIT/ML ~~LOC~~ SOLN
0.0000 [IU] | Freq: Three times a day (TID) | SUBCUTANEOUS | Status: DC
  Administered 2017-08-07: 1 [IU] via SUBCUTANEOUS
  Administered 2017-08-07 (×2): 2 [IU] via SUBCUTANEOUS
  Administered 2017-08-08: 1 [IU] via SUBCUTANEOUS
  Administered 2017-08-08 – 2017-08-09 (×3): 2 [IU] via SUBCUTANEOUS
  Administered 2017-08-09: 3 [IU] via SUBCUTANEOUS
  Administered 2017-08-10: 5 [IU] via SUBCUTANEOUS
  Administered 2017-08-10: 3 [IU] via SUBCUTANEOUS
  Administered 2017-08-10 – 2017-08-11 (×2): 2 [IU] via SUBCUTANEOUS
  Administered 2017-08-11: 5 [IU] via SUBCUTANEOUS

## 2017-08-06 MED ORDER — FUROSEMIDE 10 MG/ML IJ SOLN
120.0000 mg | Freq: Once | INTRAMUSCULAR | Status: AC
  Administered 2017-08-06: 120 mg via INTRAVENOUS
  Filled 2017-08-06: qty 10

## 2017-08-06 MED ORDER — TRAZODONE HCL 50 MG PO TABS
100.0000 mg | ORAL_TABLET | Freq: Every day | ORAL | Status: DC
  Administered 2017-08-06 – 2017-08-10 (×5): 100 mg via ORAL
  Filled 2017-08-06 (×6): qty 2

## 2017-08-06 MED ORDER — AMLODIPINE BESYLATE 5 MG PO TABS
5.0000 mg | ORAL_TABLET | Freq: Two times a day (BID) | ORAL | Status: DC
  Administered 2017-08-06 – 2017-08-11 (×10): 5 mg via ORAL
  Filled 2017-08-06 (×10): qty 1

## 2017-08-06 MED ORDER — ASPIRIN EC 81 MG PO TBEC
81.0000 mg | DELAYED_RELEASE_TABLET | Freq: Every day | ORAL | Status: DC
  Administered 2017-08-07 – 2017-08-11 (×5): 81 mg via ORAL
  Filled 2017-08-06 (×5): qty 1

## 2017-08-06 MED ORDER — OXYCODONE-ACETAMINOPHEN 5-325 MG PO TABS
1.0000 | ORAL_TABLET | Freq: Once | ORAL | Status: AC
  Administered 2017-08-09: 1 via ORAL
  Filled 2017-08-06: qty 1

## 2017-08-06 MED ORDER — GABAPENTIN 100 MG PO CAPS
100.0000 mg | ORAL_CAPSULE | Freq: Every day | ORAL | Status: DC | PRN
  Administered 2017-08-08 – 2017-08-10 (×2): 100 mg via ORAL
  Filled 2017-08-06 (×2): qty 1

## 2017-08-06 MED ORDER — ONDANSETRON HCL 4 MG PO TABS: 4.0000 mg | ORAL_TABLET | Freq: Four times a day (QID) | ORAL | Status: DC | PRN

## 2017-08-06 MED ORDER — LORAZEPAM 2 MG/ML IJ SOLN: 1.0000 mg | INTRAMUSCULAR | Status: DC | PRN

## 2017-08-06 MED ORDER — OMEGA-3-ACID ETHYL ESTERS 1 G PO CAPS
ORAL_CAPSULE | Freq: Two times a day (BID) | ORAL | Status: DC
  Administered 2017-08-06: 1 g via ORAL
  Filled 2017-08-06 (×4): qty 1

## 2017-08-06 MED ORDER — SERTRALINE HCL 50 MG PO TABS
150.0000 mg | ORAL_TABLET | Freq: Every day | ORAL | Status: DC
  Administered 2017-08-07 – 2017-08-11 (×5): 150 mg via ORAL
  Filled 2017-08-06 (×5): qty 1

## 2017-08-06 MED ORDER — SODIUM CHLORIDE 0.9% FLUSH
3.0000 mL | Freq: Two times a day (BID) | INTRAVENOUS | Status: DC
  Administered 2017-08-06 – 2017-08-11 (×10): 3 mL via INTRAVENOUS

## 2017-08-06 MED ORDER — SALINE SPRAY 0.65 % NA SOLN
1.0000 | NASAL | Status: DC | PRN
  Filled 2017-08-06: qty 44

## 2017-08-06 MED ORDER — FUROSEMIDE 10 MG/ML IJ SOLN
80.0000 mg | Freq: Three times a day (TID) | INTRAMUSCULAR | Status: DC
  Administered 2017-08-06 – 2017-08-09 (×10): 80 mg via INTRAVENOUS
  Filled 2017-08-06 (×11): qty 8

## 2017-08-06 MED ORDER — CARVEDILOL 12.5 MG PO TABS
25.0000 mg | ORAL_TABLET | Freq: Two times a day (BID) | ORAL | Status: DC
  Administered 2017-08-07 – 2017-08-11 (×9): 25 mg via ORAL
  Filled 2017-08-06 (×9): qty 2

## 2017-08-06 MED ORDER — LACTULOSE 10 GM/15ML PO SOLN
10.0000 g | Freq: Two times a day (BID) | ORAL | Status: DC
  Administered 2017-08-06 – 2017-08-10 (×9): 10 g via ORAL
  Filled 2017-08-06 (×10): qty 15

## 2017-08-06 MED ORDER — HEPARIN SODIUM (PORCINE) 5000 UNIT/ML IJ SOLN
5000.0000 [IU] | Freq: Three times a day (TID) | INTRAMUSCULAR | Status: DC
  Administered 2017-08-06 – 2017-08-11 (×13): 5000 [IU] via SUBCUTANEOUS
  Filled 2017-08-06 (×14): qty 1

## 2017-08-06 MED ORDER — FERROUS SULFATE 325 (65 FE) MG PO TABS
325.0000 mg | ORAL_TABLET | Freq: Two times a day (BID) | ORAL | Status: DC
  Administered 2017-08-07 – 2017-08-11 (×9): 325 mg via ORAL
  Filled 2017-08-06 (×9): qty 1

## 2017-08-06 MED ORDER — ATORVASTATIN CALCIUM 80 MG PO TABS
80.0000 mg | ORAL_TABLET | Freq: Every day | ORAL | Status: DC
  Administered 2017-08-06 – 2017-08-10 (×5): 80 mg via ORAL
  Filled 2017-08-06 (×5): qty 1

## 2017-08-06 MED ORDER — DOCUSATE SODIUM 100 MG PO CAPS: 200.0000 mg | ORAL_CAPSULE | Freq: Every day | ORAL | Status: DC | PRN

## 2017-08-06 MED ORDER — HYDRALAZINE HCL 25 MG PO TABS
75.0000 mg | ORAL_TABLET | Freq: Three times a day (TID) | ORAL | Status: DC
  Administered 2017-08-06 – 2017-08-11 (×14): 75 mg via ORAL
  Filled 2017-08-06 (×14): qty 3

## 2017-08-06 MED ORDER — QUETIAPINE FUMARATE 25 MG PO TABS
25.0000 mg | ORAL_TABLET | Freq: Two times a day (BID) | ORAL | Status: DC
  Administered 2017-08-06 – 2017-08-11 (×10): 25 mg via ORAL
  Filled 2017-08-06 (×10): qty 1

## 2017-08-06 MED ORDER — ACETAMINOPHEN 325 MG PO TABS: 650.0000 mg | ORAL_TABLET | Freq: Four times a day (QID) | ORAL | Status: DC | PRN

## 2017-08-06 MED ORDER — CALCITRIOL 0.5 MCG PO CAPS
0.5000 ug | ORAL_CAPSULE | ORAL | Status: DC
  Administered 2017-08-07 – 2017-08-11 (×3): 0.5 ug via ORAL
  Filled 2017-08-06 (×5): qty 1

## 2017-08-06 MED ORDER — ENSURE ENLIVE PO LIQD
237.0000 mL | Freq: Three times a day (TID) | ORAL | Status: DC
  Administered 2017-08-06 – 2017-08-11 (×9): 237 mL via ORAL

## 2017-08-06 MED ORDER — ISOSORBIDE MONONITRATE ER 30 MG PO TB24
30.0000 mg | ORAL_TABLET | Freq: Every day | ORAL | Status: DC
  Administered 2017-08-07 – 2017-08-11 (×5): 30 mg via ORAL
  Filled 2017-08-06 (×5): qty 1

## 2017-08-06 MED ORDER — ONDANSETRON HCL 4 MG/2ML IJ SOLN
4.0000 mg | Freq: Four times a day (QID) | INTRAMUSCULAR | Status: DC | PRN
Start: 2017-08-06 — End: 2017-08-11

## 2017-08-06 MED ORDER — IPRATROPIUM-ALBUTEROL 0.5-2.5 (3) MG/3ML IN SOLN
3.0000 mL | Freq: Three times a day (TID) | RESPIRATORY_TRACT | Status: DC
  Administered 2017-08-06 – 2017-08-07 (×4): 3 mL via RESPIRATORY_TRACT
  Filled 2017-08-06 (×3): qty 3

## 2017-08-06 MED ORDER — ACETAMINOPHEN 650 MG RE SUPP
650.0000 mg | Freq: Four times a day (QID) | RECTAL | Status: DC | PRN
Start: 2017-08-06 — End: 2017-08-11

## 2017-08-06 NOTE — ED Provider Notes (Signed)
[facility] EMERGENCY DEPARTMENT Provider Note   CSN: [id number] Arrival date & time: 08/06/17  7654     History   Chief Complaint Chief Complaint  Patient presents with  . Chest Pain  . Shortness of Breath    HPI Stacy Smith is a 72 y.o. female.  Is complaining of chest discomfort increased shortness of breath and edema from her feet to her upper abdomen over the course of the last 5 days.  She is increased her diuretic without any relief.  She called her cardiologist today Dr. Algernon Huxley who told her to come to the emergency department.  She denies any fever.  She has some cough with a little bit of clear sputum.  No nausea vomiting diarrhea.  No urinary symptoms.  She has had congestive heart failure before and she feels this feels like that but worse.  I asked her about any precipitating events that she states she had to stand a long time during a funeral.  The history is provided by the patient.  Chest Pain   This is a recurrent problem. The current episode started more than 2 days ago. The problem occurs constantly. The problem has been gradually worsening. The pain is associated with movement. The pain is present in the substernal region. The pain is moderate. The quality of the pain is described as pressure-like. The pain does not radiate. Associated symptoms include abdominal pain, back pain, cough, leg pain, lower extremity edema, malaise/fatigue, orthopnea, PND, shortness of breath and sputum production. Pertinent negatives include no fever, no hemoptysis, no nausea, no numbness, no palpitations, no syncope and no vomiting. She has tried rest for the symptoms. The treatment provided no relief.  Her past medical history is significant for CAD, CHF, diabetes, hypertension and seizures.  Shortness of Breath  Associated symptoms include cough, sputum production, PND, orthopnea, chest pain, abdominal pain, leg pain and leg swelling. Pertinent negatives include no  fever, no rhinorrhea, no sore throat, no ear pain, no neck pain, no hemoptysis, no syncope, no vomiting and no rash. Associated medical issues include CAD.    Past Medical History:  Diagnosis Date  . Anxiety   . Bradycardia   . CHF (congestive heart failure) ([place])   . Chronic kidney disease    CKD Stage 3  . Depression   . Diabetes mellitus without complication ([place])   . Hyperkalemia   . Hypertension   . Lupus   . Peripheral neuropathy   . Rheumatoid arthritis ([place])   . Seizures ([place])   . Shortness of breath dyspnea     Patient Active Problem List   Diagnosis Date Noted  . Cirrhosis ([place]) 07/20/2017  . Diastolic CHF ([place]) 65/07/5463  . 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 Hyponatremia 06/10/2017  . Iron deficiency anemia due to chronic blood loss 06/10/2017  . Major depression, recurrent, chronic ([place]) 06/10/2017  . Pulmonary HTN ([place])   . Seizures ([place])   . Delirium   . Diabetes type 2, uncontrolled ([place])   . Demand ischemia ([place])   . Sinus tachycardia   . Ischemic chest pain   . Diabetes type 2, controlled ([place])   . Convulsion ([place])   . Altered mental state   . HLD (hyperlipidemia)   . Rheumatoid arthritis ([place])   . Lupus (systemic lupus erythematosus) ([place])   . Acute kidney injury superimposed on chronic kidney disease ([place])   . Hypokalemia   . Hypomagnesemia   .  Convulsions/seizures ([place]) 01/22/2015  . Diabetes ([place]) 01/22/2015  . Essential hypertension 01/22/2015  . Hyperlipidemia 01/22/2015  . Seizure ([place]) 01/22/2015    Past Surgical History:  Procedure Laterality Date  . ABDOMINAL HYSTERECTOMY    . APPENDECTOMY    . CARDIAC CATHETERIZATION N/A 05/18/2016   Procedure: Right Heart Cath;  Surgeon: Burnell Blanks, MD;  Location: [facility];  Service: Cardiovascular;  Laterality: N/A;  . CYST EXCISION    . DENTAL SURGERY  01/18/15  . RIGHT HEART CATH N/A 06/12/2017   Procedure: RIGHT HEART CATH;  Surgeon:  Larey Dresser, MD;  Location: [facility];  Service: Cardiovascular;  Laterality: N/A;     OB History   None      Home Medications    Prior to Admission medications   Medication Sig Start Date End Date Taking? Authorizing Provider  amLODipine (NORVASC) 5 MG tablet Take 5 mg by mouth 2 (two) times daily.     [provider]  aspirin EC 81 MG tablet Take 81 mg by mouth daily.    [provider]  atorvastatin (LIPITOR) 80 MG tablet Take 1 tablet (80 mg total) by mouth daily at 6 PM. 06/19/17   Raiford Noble Latif, DO  calcitRIOL (ROCALTROL) 0.5 MCG capsule Take 0.5 mcg by mouth See admin instructions. Take 1 capsule  0.62mcg every Monday and Friday    [provider]  carvedilol (COREG) 25 MG tablet Take 1 tablet (25 mg total) by mouth 2 (two) times daily with a meal. 06/19/17   Raiford Noble Latif, DO  cholecalciferol (VITAMIN D) 1000 units tablet Take 1,000 Units by mouth 2 (two) times daily.    [provider]  clonazePAM (KLONOPIN) 0.5 MG tablet Take 1 tablet (0.5 mg total) by mouth daily. 06/20/17   Raiford Noble Latif, DO  cloNIDine (CATAPRES) 0.2 MG tablet Take 0.2 mg by mouth 4 (four) times daily as needed.     [provider]  diclofenac sodium (VOLTAREN) 1 % GEL Apply 2 g topically 4 (four) times daily as needed (Pain). 06/19/17   Raiford Noble Latif, DO  docusate sodium (COLACE) 100 MG capsule Take 1 capsule (100 mg total) by mouth 2 (two) times daily. Patient taking differently: Take 200 mg by mouth at bedtime.  01/28/15   Donne Hazel, MD  feeding supplement, ENSURE ENLIVE, (ENSURE ENLIVE) LIQD Take 237 mLs by mouth 3 (three) times daily between meals. 06/19/17   Raiford Noble Latif, DO  ferrous sulfate 325 (65 FE) MG tablet Take 325 mg by mouth 2 (two) times daily with a meal.     [provider]  furosemide (LASIX) 80 MG tablet Take 1 tablet (80 mg total) by mouth every morning. 07/14/17   Larey Dresser, MD  gabapentin  (NEURONTIN) 100 MG capsule Take 100 mg by mouth 3 (three) times daily.    [provider]  guaiFENesin (MUCINEX) 600 MG 12 hr tablet Take 2 tablets (1,200 mg total) by mouth 2 (two) times daily. 06/19/17   Raiford Noble Latif, DO  hydrALAZINE (APRESOLINE) 25 MG tablet Take 3 tablets (75 mg total) by mouth every 8 (eight) hours. 06/19/17   Sheikh, Omair Latif, DO  insulin aspart (NOVOLOG FLEXPEN) 100 UNIT/ML FlexPen Inject 5 Units into the skin 3 (three) times daily with meals.    [provider]  insulin detemir (LEVEMIR) 100 UNIT/ML injection Inject 20-30 Units into the skin See admin instructions. Take 20-30 units twice daily  depending on blood sugar reading    [provider]  ipratropium-albuterol (DUONEB) 0.5-2.5 (3) MG/3ML SOLN Take 3 mLs by nebulization every 6 (six) hours as needed (for shortness of breath).     [provider]  isosorbide mononitrate (IMDUR) 30 MG 24 hr tablet Take 1 tablet (30 mg total) by mouth daily. 06/20/17   Raiford Noble Latif, DO  lactulose (CHRONULAC) 10 GM/15ML solution Take 45 mLs (30 g total) by mouth 2 (two) times daily. 06/19/17   Raiford Noble Latif, DO  ondansetron (ZOFRAN-ODT) 8 MG disintegrating tablet Take 8 mg by mouth at bedtime.  11/14/14   [provider]  oxyCODONE (OXY IR/ROXICODONE) 5 MG immediate release tablet Take 5 mg by mouth every 6 (six) hours as needed for severe pain.    [provider]  sertraline (ZOLOFT) 50 MG tablet Take 3 tablets (150 mg total) by mouth daily. 06/20/17   Raiford Noble Latif, DO  sodium chloride (OCEAN) 0.65 % SOLN nasal spray Place 1 spray into both nostrils as needed for congestion. 06/19/17   Raiford Noble Latif, DO  traZODone (DESYREL) 100 MG tablet Take 1 tablet (100 mg total) by mouth at bedtime. 06/19/17   Kerney Elbe, DO    Family History Family History  Problem Relation Age of Onset  . Heart failure Mother   . Liver cancer Father   . Lung cancer Father   .  Cancer Sister   . Heart murmur Brother     Social History Social History   Tobacco Use  . Smoking status: Never Smoker  . Smokeless tobacco: Never Used  Substance Use Topics  . Alcohol use: No    Alcohol/week: 0.0 oz  . Drug use: No     Allergies   Phenytoin sodium extended and Latex   Review of Systems Review of Systems  Constitutional: Positive for fatigue and malaise/fatigue. Negative for chills and fever.  HENT: Negative for ear pain, rhinorrhea and sore throat.   Eyes: Negative for pain and visual disturbance.  Respiratory: Positive for cough, sputum production and shortness of breath. Negative for hemoptysis.   Cardiovascular: Positive for chest pain, orthopnea, leg swelling and PND. Negative for palpitations and syncope.  Gastrointestinal: Positive for abdominal pain and constipation. Negative for diarrhea, nausea and vomiting.  Genitourinary: Negative for dysuria, frequency and hematuria.  Musculoskeletal: Positive for back pain. Negative for arthralgias and neck pain.  Skin: Negative for color change and rash.  Neurological: Positive for seizures. Negative for syncope and numbness.  All other systems reviewed and are negative.    Physical Exam Updated Vital Signs BP (!) 149/50 (BP Location: Left Arm)   Pulse 60   Temp 98.1 F (36.7 C) (Oral)   Resp 20   Ht 5' (1.524 m)   Wt 72.7 kg (160 lb 6 oz)   SpO2 95%   BMI 31.32 kg/m   Physical Exam  Constitutional: She is oriented to person, place, and time. She appears well-developed and well-nourished. No distress.  HENT:  Head: Normocephalic and atraumatic.  Eyes: Pupils are equal, round, and reactive to light. EOM are normal.  Neck: Normal range of motion. Neck supple.  Cardiovascular: Normal rate, regular rhythm and normal pulses.  Pulmonary/Chest: Effort normal. No accessory muscle usage or stridor. She has rales in the right lower field and the left lower field.  Abdominal: Soft. She exhibits no mass.  There is no tenderness.  Musculoskeletal: Normal range of motion.       Right  lower leg: She exhibits tenderness and edema.       Left lower leg: She exhibits tenderness and edema.  No cords.  Neurological: She is alert and oriented to person, place, and time.  Skin: Skin is warm and dry. Capillary refill takes less than 2 seconds. No rash noted.  Psychiatric: She has a normal mood and affect.     ED Treatments / Results  Labs (all labs ordered are listed, but only abnormal results are displayed) Labs Reviewed  BASIC METABOLIC PANEL - Abnormal; Notable for the following components:      Result Value   Glucose, Bld 122 (*)    BUN 53 (*)    Creatinine, Ser 3.75 (*)    Calcium 8.8 (*)    GFR calc non Af Amer 11 (*)    GFR calc Af Amer 13 (*)    All other components within normal limits  CBC - Abnormal; Notable for the following components:   WBC 3.7 (*)    RBC 2.98 (*)    Hemoglobin 8.5 (*)    HCT 27.2 (*)    RDW 18.0 (*)    Platelets 111 (*)    All other components within normal limits  I-STAT TROPONIN, ED    EKG EKG Interpretation  Date/Time:  Sunday August 06 2017 09:58:27 EDT Ventricular Rate:  60 PR Interval:  162 QRS Duration: 84 QT Interval:  436 QTC Calculation: 436 R Axis:   7 Text Interpretation:  Normal sinus rhythm Nonspecific T wave abnormality Abnormal ECG slower rate than prior 1/19 Confirmed by Aletta Edouard [phone]) on 08/06/2017 1:51:42 PM   Radiology Dg Chest 2 View  Result Date: 08/06/2017 CLINICAL DATA:  Lower extremity edema for 5 days, fluid buildup, shortness of breath overnight, history CHF, diabetes mellitus, hypertension EXAM: CHEST - 2 VIEW COMPARISON:  06/19/2017 FINDINGS: Enlargement of cardiac silhouette with pulmonary vascular congestion. Mediastinal contours normal. Bibasilar small pleural effusions. No acute infiltrate or pneumothorax. Bones demineralized. IMPRESSION: Enlargement of cardiac silhouette with pulmonary vascular congestion.  Small bibasilar pleural effusions. Electronically Signed   By: Lavonia Dana M.D.   On: 08/06/2017 11:42    Procedures Procedures (including critical care time)  Medications Ordered in ED Medications  oxyCODONE-acetaminophen (PERCOCET/ROXICET) 5-325 MG per tablet 1 tablet (has no administration in time range)  furosemide (LASIX) 120 mg in dextrose 5 % 50 mL IVPB (has no administration in time range)     Initial Impression / Assessment and Plan / ED Course  I have reviewed the triage vital signs and the nursing notes.  Pertinent labs & imaging results that were available during my care of the patient were reviewed by me and considered in my medical decision making (see chart for details).  Clinical Course as of Aug 08 1307  Sun Aug 06, 2017  1410 Last echo 1/19 - Impressions:  - Vigorous LV systolic function; mild diastolic dysfunction with   elevated LV filling pressure; mild LVH; elevated LVOT gradient   (mean gradient 17 mmHg) at least partially explained by vigorous   LV function; aortic valve not well visualized but appears to open   well; mild biatrial enlargement; mild RVE; mild TR with moderate   to severe pulmonary hypertension.    [MB]  1410 Patient with known CHF here with worsening shortness of breath and peripheral edema.  She also has chronic anemia and CKD.  She looks fairly comfortable at rest and is not hypoxic on room air.  Differential includes CHF, myocardial  ischemia, pneumonia, symptomatic anemia, worsening renal failure.   [MB]  1519 Discussed with Dr. Aggie Moats from [facility] who will admit the patient.   [MB]    Clinical Course User Index [MB] Hayden Rasmussen, MD     Final Clinical Impressions(s) / ED Diagnoses   Final diagnoses:  AKI (acute kidney injury) ([place])  Epigastric pain  Chronic renal disease, stage 4, severely decreased glomerular filtration rate between 15-29 mL/min/1.73 square meter [facility])    ED Discharge Orders    None       Hayden Rasmussen, MD 08/07/17 1310

## 2017-08-06 NOTE — ED Triage Notes (Signed)
Brother stated, she has had chest pain with fluid buildup I her lungs. Called doctor and he said bring her here.

## 2017-08-06 NOTE — Telephone Encounter (Signed)
Received a call from patients daughter who states her mom has had LE edema for the past several days, how with pitting edema in the torso. Patient was experiencing SOB overnight. Has increased home po lasix to 80mg  BID (up from daily) for the past 3 days without relief or improvement in symptoms. Given severity of edema, SOB, and no improvement with increased lasix, patients daughter instructed to bring patient to the ED for further management.

## 2017-08-06 NOTE — H&P (Signed)
[facility] History and Physical  Stacy Smith CVE:[id number] DOB: 07-Nov-1945 DOA: 08/06/2017  Referring physician:  PCP: Jacqualine Code, DO   Chief Complaint: "I started swelling."  HPI: Stacy Smith is a 72 y.o. female past medical history significant for heart failure, CKD stage IV, diabetes, lupus, rheumatoid arthritis, questionable seizures presents the emergency room with a chief complaint of shortness of breath and swelling.  Patient states that little over week ago she had a stent in the funeral she believes is when her swelling started.  Additional symptom of shortness of breath and cough starting last Tuesday.  Both of those symptoms started to worsen.  Patient's cough became productive.  Color unknown.  Swelling ran up patient's legs and back.  Patient states this is the worst it has ever been.  She states that she has continued to take her Lasix throughout all of this but the amount of urine she makes after taking it has decreased.  ED course: In the emergency room patient was found to not be hypoxic.  Chest x-ray showed vascular congestion.  Patient with dependent edema going up the legs and back on physical exam.  Patient was given Lasix.  Hospitalist consulted for admission.   Review of Systems:  As per HPI otherwise 10 point review of systems negative.    Past Medical History:  Diagnosis Date  . Anxiety   . Bradycardia   . CHF (congestive heart failure) ([place])   . Chronic kidney disease    CKD Stage 3  . Depression   . Diabetes mellitus without complication ([place])   . Hyperkalemia   . Hypertension   . Lupus   . Peripheral neuropathy   . Rheumatoid arthritis ([place])   . Seizures ([place])   . Shortness of breath dyspnea    Past Surgical History:  Procedure Laterality Date  . ABDOMINAL HYSTERECTOMY    . APPENDECTOMY    . CARDIAC CATHETERIZATION N/A 05/18/2016   Procedure: Right Heart Cath;  Surgeon: Burnell Blanks, MD;  Location: [facility];  Service: Cardiovascular;  Laterality: N/A;  . CYST EXCISION    . DENTAL SURGERY  01/18/15  . RIGHT HEART CATH N/A 06/12/2017   Procedure: RIGHT HEART CATH;  Surgeon: Larey Dresser, MD;  Location: [facility];  Service: Cardiovascular;  Laterality: N/A;   Social History:  reports that she has never smoked. She has never used smokeless tobacco. She reports that she does not drink alcohol or use drugs.  Allergies  Allergen Reactions  . Phenytoin Sodium Extended Itching  . Potassium-Containing Compounds Other (See Comments)    unspecified  . Latex Swelling    Family History  Problem Relation Age of Onset  . Heart failure Mother   . Liver cancer Father   . Lung cancer Father   . Cancer Sister   . Heart murmur Brother      Prior to Admission medications   Medication Sig Start Date End Date Taking? Authorizing Provider  amLODipine (NORVASC) 5 MG tablet Take 5 mg by mouth 2 (two) times daily.    Yes [provider]  aspirin EC 81 MG tablet Take 81 mg by mouth daily.   Yes [provider]  atorvastatin (LIPITOR) 80 MG tablet Take 1 tablet (80 mg total) by mouth daily at 6 PM. 06/19/17  Yes Raiford Noble [place], DO  calcitRIOL (ROCALTROL) 0.5 MCG capsule Take 0.5 mcg by mouth every Monday, Wednesday, and Friday.  Yes [provider]  carvedilol (COREG) 25 MG tablet Take 1 tablet (25 mg total) by mouth 2 (two) times daily with a meal. 06/19/17  Yes Sheikh, Omair Latif, DO  cloNIDine (CATAPRES) 0.2 MG tablet Take 0.2 mg by mouth daily as needed (blood pressure).    Yes [provider]  docusate sodium (COLACE) 100 MG capsule Take 1 capsule (100 mg total) by mouth 2 (two) times daily. Patient taking differently: Take 200 mg by mouth daily as needed for mild constipation.  01/28/15  Yes Donne Hazel, MD  feeding supplement, ENSURE ENLIVE, (ENSURE ENLIVE) LIQD Take 237 mLs by mouth 3 (three) times daily between meals. 06/19/17  Yes Sheikh, Omair  Latif, DO  ferrous sulfate 325 (65 FE) MG tablet Take 325 mg by mouth 2 (two) times daily with a meal.    Yes [provider]  furosemide (LASIX) 80 MG tablet Take 1 tablet (80 mg total) by mouth every morning. 07/14/17  Yes Larey Dresser, MD  gabapentin (NEURONTIN) 100 MG capsule Take 100 mg by mouth daily as needed (nerve pain).    Yes [provider]  hydrALAZINE (APRESOLINE) 25 MG tablet Take 3 tablets (75 mg total) by mouth every 8 (eight) hours. Patient taking differently: Take 75 mg by mouth 3 (three) times daily.  06/19/17  Yes Sheikh, Omair Latif, DO  insulin aspart (NOVOLOG FLEXPEN) 100 UNIT/ML FlexPen Inject into the skin 3 (three) times daily with meals. Per sliding scale   Yes [provider]  insulin detemir (LEVEMIR) 100 UNIT/ML injection Inject 10-40 Units into the skin See admin instructions. 40 units in am, 1-15 units in pm   Yes [provider]  ipratropium-albuterol (DUONEB) 0.5-2.5 (3) MG/3ML SOLN Take 3 mLs by nebulization every 6 (six) hours as needed (for shortness of breath).    Yes [provider]  isosorbide mononitrate (IMDUR) 30 MG 24 hr tablet Take 1 tablet (30 mg total) by mouth daily. 06/20/17  Yes Sheikh, Omair Latif, DO  lactulose (CHRONULAC) 10 GM/15ML solution Take 45 mLs (30 g total) by mouth 2 (two) times daily. Patient taking differently: Take 10 g by mouth 2 (two) times daily.  06/19/17  Yes Sheikh, Omair Latif, DO  Omega-3 Fatty Acids (OMEGA 3 PO) Take 520 mg by mouth 2 (two) times daily.   Yes [provider]  ondansetron (ZOFRAN-ODT) 8 MG disintegrating tablet Take 8 mg by mouth daily as needed for nausea or vomiting.  11/14/14  Yes [provider]  oxyCODONE (OXY IR/ROXICODONE) 5 MG immediate release tablet Take 5 mg by mouth every 6 (six) hours as needed for severe pain.   Yes [provider]  QUEtiapine (SEROQUEL) 25 MG tablet Take 25 mg by mouth 2 (two) times daily.   Yes [provider]  sertraline (ZOLOFT) 50 MG tablet Take 3 tablets (150 mg total) by mouth daily. 06/20/17  Yes Sheikh, Omair Latif, DO  sodium chloride (OCEAN) 0.65 % SOLN nasal spray Place 1 spray into both nostrils as needed for congestion. 06/19/17  Yes Sheikh, Omair Latif, DO  traZODone (DESYREL) 100 MG tablet Take 1 tablet (100 mg total) by mouth at bedtime. 06/19/17  Yes Sheikh, Omair Latif, DO  Vitamin D, Ergocalciferol, (DRISDOL) 50000 units CAPS capsule Take 50,000 Units by mouth every 7 (seven) days.   Yes [provider]  clonazePAM (KLONOPIN) 0.5 MG tablet Take 1 tablet (0.5 mg total) by mouth daily. Patient not taking: Reported on 08/06/2017 06/20/17  Sheikh, Omair Latif, DO  diclofenac sodium (VOLTAREN) 1 % GEL Apply 2 g topically 4 (four) times daily as needed (Pain). Patient not taking: Reported on 08/06/2017 06/19/17   Raiford Noble Latif, DO  guaiFENesin (MUCINEX) 600 MG 12 hr tablet Take 2 tablets (1,200 mg total) by mouth 2 (two) times daily. Patient not taking: Reported on 08/06/2017 06/19/17   Kerney Elbe, DO   Physical Exam: Vitals:   08/06/17 0957 08/06/17 0958 08/06/17 1002  BP:   (!) 149/50  Pulse:   60  Resp:   20  Temp:   98.1 F (36.7 C)  TempSrc:   Oral  SpO2:   95%  Weight: 76.8 kg (169 lb 6 oz) 72.7 kg (160 lb 6 oz)   Height: 5' (1.524 m)      Wt Readings from Last 3 Encounters:  08/06/17 72.7 kg (160 lb 6 oz)  07/20/17 72.5 kg (159 lb 12.8 oz)  07/07/17 71.6 kg (157 lb 12.8 oz)    General:  Appears calm and comfortable; A&Ox3, dependent anasarca of BLE/hips/lower back Eyes:  PERRL, EOMI, normal lids, iris ENT:  grossly normal hearing, lips & tongue Neck:  no LAD, masses or thyromegaly Cardiovascular:  RRR, no m/r/g. No LE edema.  Respiratory:  Mild diffuse wheeze, incr wob, tachypnea Abdomen:  soft, epigastric tenderness, nd Skin:  no rash or induration seen on limited exam Musculoskeletal:  grossly normal tone BUE/BLE Psychiatric:  grossly  normal mood and affect, speech fluent and appropriate Neurologic:  CN 2-12 grossly intact, moves all extremities in coordinated fashion.          Labs on Admission:  Basic Metabolic Panel: Recent Labs  Lab 08/06/17 1006  NA 135  K 4.3  CL 102  CO2 22  GLUCOSE 122*  BUN 53*  CREATININE 3.75*  CALCIUM 8.8*   Liver Function Tests: No results for input(s): AST, ALT, ALKPHOS, BILITOT, PROT, ALBUMIN in the last 168 hours. No results for input(s): LIPASE, AMYLASE in the last 168 hours. No results for input(s): AMMONIA in the last 168 hours. CBC: Recent Labs  Lab 08/06/17 1006  WBC 3.7*  HGB 8.5*  HCT 27.2*  MCV 91.3  PLT 111*   Cardiac Enzymes: No results for input(s): CKTOTAL, CKMB, CKMBINDEX, TROPONINI in the last 168 hours.  BNP (last 3 results) Recent Labs    06/10/17 1538 06/11/17 0632 06/26/17 1424  BNP 394.7* 470.6* 792.7*    ProBNP (last 3 results) No results for input(s): PROBNP in the last 8760 hours.   Serum creatinine: 3.75 mg/dL (H) 08/06/17 1006 Estimated creatinine clearance: 12.1 mL/min (A)  CBG: No results for input(s): GLUCAP in the last 168 hours.  Radiological Exams on Admission: Dg Chest 2 View  Result Date: 08/06/2017 CLINICAL DATA:  Lower extremity edema for 5 days, fluid buildup, shortness of breath overnight, history CHF, diabetes mellitus, hypertension EXAM: CHEST - 2 VIEW COMPARISON:  06/19/2017 FINDINGS: Enlargement of cardiac silhouette with pulmonary vascular congestion. Mediastinal contours normal. Bibasilar small pleural effusions. No acute infiltrate or pneumothorax. Bones demineralized. IMPRESSION: Enlargement of cardiac silhouette with pulmonary vascular congestion. Small bibasilar pleural effusions. Electronically Signed   By: Lavonia Dana M.D.   On: 08/06/2017 11:42    EKG: Independently reviewed. NSR, no stemi.  Assessment/Plan Principal Problem:   Anasarca Active Problems:   Convulsions/seizures (H[facility])   Diabetes  ([place])   Essential hypertension   Diastolic CHF ([place])   Acute on chronic kidney failure ([facility])  CHF/Dyspnea/Anasarca Serial troponin,  x2 neg Diuresis?, conulting nepro Fluid seen on exam and CXR Echo limited Ins and outs Continuous pulse ox Cont coreg Consult to nephro and cardio   AKI/CKD IV Baseline Cr 2.4, Cr on admit 3.75 No normal saline given in the emergency room Checking magnesium and phosphorus Urine labs ordered to calculate fractional excretion of urea PVR Renal U/S Cont rocaltrol  Anemia Cont iron tabs bid 8.5hgb on admit  Chronic pain Cont gabapentin Hold oxycodone  Constipation Cont lactulose  COPD Prn->sch duoneb Prn albuterol  CAD Cont imdur  Hypertension When necessary hydralazine 10 mg IV as needed for severe blood pressure TID PO hydralazine Cont norvasc  Anxiety No SI/HI Cont zoloft qd,Seroquel bid, trazadone qhs  Seizures hx Prn ativan Seizure precautions  Hyperlipidemia Continue statin & fish oil  Epigastric pain Check KUB  DM SSI AC (renal) Holding levemir  Nutrition TID suppl  Code Status: FC DVT Prophylaxis: heparin Family Communication: brother at bedside Disposition Plan: Pending Improvement  Status: tele inpt  Elwin Mocha, MD Family Medicine [facility] [email or web address] Password [email or web address]

## 2017-08-07 ENCOUNTER — Inpatient Hospital Stay (HOSPITAL_COMMUNITY): Payer: Medicare HMO

## 2017-08-07 DIAGNOSIS — N170 Acute kidney failure with tubular necrosis: Secondary | ICD-10-CM

## 2017-08-07 DIAGNOSIS — I361 Nonrheumatic tricuspid (valve) insufficiency: Secondary | ICD-10-CM

## 2017-08-07 DIAGNOSIS — N184 Chronic kidney disease, stage 4 (severe): Secondary | ICD-10-CM

## 2017-08-07 DIAGNOSIS — R601 Generalized edema: Secondary | ICD-10-CM

## 2017-08-07 DIAGNOSIS — I5033 Acute on chronic diastolic (congestive) heart failure: Secondary | ICD-10-CM

## 2017-08-07 LAB — BASIC METABOLIC PANEL
ANION GAP: 10 (ref 5–15)
BUN: 54 mg/dL — ABNORMAL HIGH (ref 6–20)
CALCIUM: 8.5 mg/dL — AB (ref 8.9–10.3)
CO2: 25 mmol/L (ref 22–32)
Chloride: 103 mmol/L (ref 101–111)
Creatinine, Ser: 3.45 mg/dL — ABNORMAL HIGH (ref 0.44–1.00)
GFR calc Af Amer: 14 mL/min — ABNORMAL LOW (ref >60)
GFR calc non Af Amer: 12 mL/min — ABNORMAL LOW (ref >60)
GLUCOSE: 165 mg/dL — AB (ref 65–99)
POTASSIUM: 3.7 mmol/L (ref 3.5–5.1)
SODIUM: 138 mmol/L (ref 135–145)

## 2017-08-07 LAB — ECHOCARDIOGRAM LIMITED
HEIGHTINCHES: 60 in
Weight: 2497.6 oz

## 2017-08-07 LAB — CBC
HEMATOCRIT: 25.1 % — AB (ref 36.0–46.0)
HEMOGLOBIN: 7.8 g/dL — AB (ref 12.0–15.0)
MCH: 27.8 pg (ref 26.0–34.0)
MCHC: 31.1 g/dL (ref 30.0–36.0)
MCV: 89.3 fL (ref 78.0–100.0)
Platelets: 112 10*3/uL — ABNORMAL LOW (ref 150–400)
RBC: 2.81 MIL/uL — ABNORMAL LOW (ref 3.87–5.11)
RDW: 17.1 % — ABNORMAL HIGH (ref 11.5–15.5)
WBC: 2.3 10*3/uL — ABNORMAL LOW (ref 4.0–10.5)

## 2017-08-07 LAB — GLUCOSE, CAPILLARY
GLUCOSE-CAPILLARY: 137 mg/dL — AB (ref 65–99)
GLUCOSE-CAPILLARY: 153 mg/dL — AB (ref 65–99)
GLUCOSE-CAPILLARY: 154 mg/dL — AB (ref 65–99)
Glucose-Capillary: 196 mg/dL — ABNORMAL HIGH (ref 65–99)

## 2017-08-07 LAB — UREA NITROGEN, URINE: UREA NITROGEN UR: 345 mg/dL

## 2017-08-07 LAB — IRON AND TIBC
IRON: 30 ug/dL (ref 28–170)
Saturation Ratios: 13 % (ref 10.4–31.8)
TIBC: 231 ug/dL — AB (ref 250–450)
UIBC: 201 ug/dL

## 2017-08-07 MED ORDER — IPRATROPIUM-ALBUTEROL 0.5-2.5 (3) MG/3ML IN SOLN
3.0000 mL | Freq: Three times a day (TID) | RESPIRATORY_TRACT | Status: DC
  Administered 2017-08-07 – 2017-08-08 (×2): 3 mL via RESPIRATORY_TRACT
  Filled 2017-08-07 (×2): qty 3

## 2017-08-07 MED ORDER — OMEGA-3-ACID ETHYL ESTERS 1 G PO CAPS
1.0000 g | ORAL_CAPSULE | Freq: Two times a day (BID) | ORAL | Status: DC
  Administered 2017-08-07 – 2017-08-11 (×9): 1 g via ORAL
  Filled 2017-08-07 (×9): qty 1

## 2017-08-07 NOTE — Progress Notes (Signed)
Visited with patient via [place].  Patient states she is in Stage 4 kidney disease and having trouble with liver and CHF.  She requested prayer.  She stated she is [employer] but believes we all serve the same God.  I led prayer and we continued to talk about strength for the journey.  Will remain available as needed.    08/07/17 1131  Clinical Encounter Type  Visited With Patient;Family (brother visiting)  Visit Type Initial;Spiritual support  Spiritual Encounters  Spiritual Needs Prayer

## 2017-08-07 NOTE — Progress Notes (Signed)
PROGRESS NOTE    Stacy Smith  BWL:[id number] DOB: [birth year] DOA: 08/06/2017 PCP: Jacqualine Code, DO    Brief Narrative:  Stacy Smith is a 72 y.o. female with CKD stage IV, Diastolic CHF, HTN, Cirrhosis, DM, Depression, and Pulmonary HTN.   Pt was recently admitted 1/26 - 06/19/16 with acute/chronic diastolic CHF. Diuresed to discharge weight of 158 lbs.   She was seen in HF clinic 07/20/17. Lasix had been increased, and then decreased again with AKI. No med changes with AKI  Assessment & Plan:   Principal Problem:   Anasarca/  Acute on chronic kidney failure [facility]) - Nephrology assisting with diuresis and further planning. Currently considering vein mapping.  Very much appreciate nephrologies assistance.     Diastolic CHF ([place]) - Pt continues to have multiple admission and I would like to consult cardiology for assistance with optimizing medications in order to avoid or prolong periods of times of hospitalization. Very grateful for cardiology's assistance.   Active Problems:   Convulsions/seizures ([place]) - pt is on prn ativan    Diabetes ([place]) - Pt on SSI    Essential hypertension - pt on amlodipine, coreg, imdur, stable on this regimen  Hyperlipidemia - Pt is on statin  DVT prophylaxis: Heparin Code Status: Full Family Communication: none at bedside Disposition Plan: pending improvement in condition   Consultants:   Nephrology  Cardiology   Procedures: none   Antimicrobials: none   Subjective: Pt has no new complaints. No acute issues reported overnight.   Objective: Vitals:   08/07/17 0429 08/07/17 0837 08/07/17 1142 08/07/17 1440  BP: (!) 153/55 (!) 140/58 (!) 131/45   Pulse: 60 60 61   Resp: 18  18   Temp: 98.8 F (37.1 C)  98.6 F (37 C) 99.5 F (37.5 C)  TempSrc: Oral  Oral   SpO2: 96%  93%   Weight: 70.8 kg (156 lb 1.6 oz)     Height:        Intake/Output Summary (Last 24 hours) at 08/07/2017 1440 Last data filed at 08/07/2017  1345 Gross per 24 hour  Intake 480 ml  Output 1650 ml  Net -1170 ml   Filed Weights   08/06/17 0958 08/06/17 1854 08/07/17 0429  Weight: 72.7 kg (160 lb 6 oz) 72 kg (158 lb 12.8 oz) 70.8 kg (156 lb 1.6 oz)    Examination:  General exam: Appears calm and comfortable, in nad.  Respiratory system: Clear to auscultation. Respiratory effort normal. Cardiovascular system: S1 & S2 heard, RRR.  no murmurs  Gastrointestinal system: Abdomen is nondistended, soft and nontender. No organomegaly or masses felt. Normal bowel sounds heard. Central nervous system: Alert and oriented. No focal neurological deficits. Extremities: Symmetric 5 x 5 power. Skin: No rashes, lesions or ulcers Psychiatry:  Mood & affect appropriate.   Data Reviewed: I have personally reviewed following labs and imaging studies  CBC: Recent Labs  Lab 08/06/17 1006 08/07/17 0620  WBC 3.7* 2.3*  HGB 8.5* 7.8*  HCT 27.2* 25.1*  MCV 91.3 89.3  PLT 111* 681*   Basic Metabolic Panel: Recent Labs  Lab 08/06/17 1006 08/06/17 1604 08/07/17 0620  NA 135  --  138  K 4.3  --  3.7  CL 102  --  103  CO2 22  --  25  GLUCOSE 122*  --  165*  BUN 53*  --  54*  CREATININE 3.75*  --  3.45*  CALCIUM 8.8*  --  8.5*  MG  --  2.6*  --   PHOS  --  4.3  --    GFR: Estimated Creatinine Clearance: 12.9 mL/min (A) (by C-G formula based on SCr of 3.45 mg/dL (H)). Liver Function Tests: No results for input(s): AST, ALT, ALKPHOS, BILITOT, PROT, ALBUMIN in the last 168 hours. No results for input(s): LIPASE, AMYLASE in the last 168 hours. No results for input(s): AMMONIA in the last 168 hours. Coagulation Profile: No results for input(s): INR, PROTIME in the last 168 hours. Cardiac Enzymes: Recent Labs  Lab 08/06/17 1604  TROPONINI <0.03   BNP (last 3 results) No results for input(s): PROBNP in the last 8760 hours. HbA1C: No results for input(s): HGBA1C in the last 72 hours. CBG: Recent Labs  Lab 08/07/17 0806  08/07/17 1238  GLUCAP 137* 196*   Lipid Profile: No results for input(s): CHOL, HDL, LDLCALC, TRIG, CHOLHDL, LDLDIRECT in the last 72 hours. Thyroid Function Tests: No results for input(s): TSH, T4TOTAL, FREET4, T3FREE, THYROIDAB in the last 72 hours. Anemia Panel: Recent Labs    08/07/17 1017  TIBC 231*  IRON 30   Sepsis Labs: No results for input(s): PROCALCITON, LATICACIDVEN in the last 168 hours.  No results found for this or any previous visit (from the past 240 hour(s)).   Radiology Studies: Dg Chest 2 View  Result Date: 08/06/2017 CLINICAL DATA:  Lower extremity edema for 5 days, fluid buildup, shortness of breath overnight, history CHF, diabetes mellitus, hypertension EXAM: CHEST - 2 VIEW COMPARISON:  06/19/2017 FINDINGS: Enlargement of cardiac silhouette with pulmonary vascular congestion. Mediastinal contours normal. Bibasilar small pleural effusions. No acute infiltrate or pneumothorax. Bones demineralized. IMPRESSION: Enlargement of cardiac silhouette with pulmonary vascular congestion. Small bibasilar pleural effusions. Electronically Signed   By: Lavonia Dana M.D.   On: 08/06/2017 11:42   Dg Abd 1 View  Result Date: 08/06/2017 CLINICAL DATA:  Umbilical pain. EXAM: ABDOMEN - 1 VIEW COMPARISON:  None. FINDINGS: Bowel gas pattern is nonobstructive. No mass or mass effect. No free peritoneal air. Few pelvic phleboliths are present. There is mild degenerate change of the spine and hips. IMPRESSION: Nonobstructive bowel gas pattern. Electronically Signed   By: Marin Olp M.D.   On: 08/06/2017 16:57   US Renal  Result Date: 08/06/2017 CLINICAL DATA:  Acute renal insufficiency. EXAM: RENAL / URINARY TRACT ULTRASOUND COMPLETE COMPARISON:  CT 06/17/2017 FINDINGS: Right Kidney: Length: 11.6 cm. Echogenicity within normal limits. Mild prominence of the central intrarenal collecting system and renal pelvis. No focal mass. Left Kidney: Length: 8.4 cm. Echogenicity within normal  limits. No mass or hydronephrosis visualized. Bladder: Appears normal for degree of bladder distention. Ureteral jets not visualized. Ascites. IMPRESSION: Mild size discrepancy with slightly small left kidney. Mild dilatation of the central right intrarenal collecting system/renal pelvis. Electronically Signed   By: Marin Olp M.D.   On: 08/06/2017 16:56    Scheduled Meds: . amLODipine  5 mg Oral BID  . aspirin EC  81 mg Oral Daily  . atorvastatin  80 mg Oral q1800  . calcitRIOL  0.5 mcg Oral Q M,W,F  . carvedilol  25 mg Oral BID WC  . feeding supplement (ENSURE ENLIVE)  237 mL Oral TID BM  . ferrous sulfate  325 mg Oral BID WC  . furosemide  80 mg Intravenous TID  . heparin  5,000 Units Subcutaneous Q8H  . hydrALAZINE  75 mg Oral TID  . insulin aspart  0-9 Units Subcutaneous TID WC  . ipratropium-albuterol  3 mL Nebulization TID  .  isosorbide mononitrate  30 mg Oral Daily  . lactulose  10 g Oral BID  . omega-3 acid ethyl esters  1 g Oral BID  . oxyCODONE-acetaminophen  1 tablet Oral Once  . QUEtiapine  25 mg Oral BID  . sertraline  150 mg Oral Daily  . sodium chloride flush  3 mL Intravenous Q12H  . traZODone  100 mg Oral QHS   Continuous Infusions:   LOS: 1 day    Time spent: > 35 minutes   Velvet Bathe, MD [facility] Pager [phone]  If 7PM-7AM, please contact night-coverage [email or web address] Password [email or web address] 08/07/2017, 2:40 PM

## 2017-08-07 NOTE — Progress Notes (Signed)
  Echocardiogram 2D Echocardiogram has been performed.  [name] 08/07/2017, 10:05 AM

## 2017-08-07 NOTE — Plan of Care (Signed)
  Problem: Education: Goal: Knowledge of General Education information will improve Outcome: Completed/Met   Problem: Elimination: Goal: Will not experience complications related to bowel motility Outcome: Progressing   Problem: Safety: Goal: Ability to remain free from injury will improve Outcome: Progressing   Problem: Education: Goal: Ability to demonstrate management of disease process will improve Outcome: Progressing Goal: Ability to verbalize understanding of medication therapies will improve Outcome: Progressing   Problem: Education: Goal: Ability to verbalize understanding of medication therapies will improve Outcome: Progressing   Problem: Activity: Goal: Capacity to carry out activities will improve Outcome: Progressing   Problem: Activity: Goal: Capacity to carry out activities will improve Outcome: Progressing   Problem: Activity: Goal: Capacity to carry out activities will improve Outcome: Progressing   Problem: Cardiac: Goal: Ability to achieve and maintain adequate cardiopulmonary perfusion will improve Outcome: Progressing

## 2017-08-07 NOTE — Consult Note (Signed)
Referring Provider: No ref. provider found Primary Care Physician:  Jacqualine Code, DO Primary Nephrologist:  [facility] nephrologist  Reason for Consultation:  Acute on chronic renal disease  Hypertension  Congestive heart failure and anemia  HPI: 72 y.o. with history of CKD stage IV, diastolic CHF, HTN, cirrhosis, DM, depression, and pulmonary hypertension presents for followup of CHF.   1/26-06/19/16 with acute on chronic diastolic HF. She was diuresed with IV lasix. Coreg and hydralazine were increased  Baseline creatinine appears to be about 2.5 - 3   She clearly has stage 4 chronic renal disease  She does not have much lower extremity edema and appears that her RHC  1/18 demonstrated fairly severe pulmonary hypertension   Her admission creatinine was close to 3 mg/dl  She has a nephrologist in [place]   She had a very pleasant diuresis with IV lasix last night and started on oral lasix  Her dry weight is probably about 150 -155 lbs Her blood pressure control does not appear unreasonable although certainly she could be titrated up on her coreg and hydralazine It may be worth while placing a fistula or at least getting some vein mapping      Past Medical History:  Diagnosis Date  . Anxiety   . Bradycardia   . CHF (congestive heart failure) ([place])   . Chronic kidney disease    CKD Stage 3  . Depression   . Diabetes mellitus without complication ([place])   . Hyperkalemia   . Hypertension   . Lupus   . Peripheral neuropathy   . Rheumatoid arthritis ([place])   . Seizures ([place])   . Shortness of breath dyspnea     Past Surgical History:  Procedure Laterality Date  . ABDOMINAL HYSTERECTOMY    . APPENDECTOMY    . CARDIAC CATHETERIZATION N/A 05/18/2016   Procedure: Right Heart Cath;  Surgeon: Burnell Blanks, MD;  Location: [facility];  Service: Cardiovascular;  Laterality: N/A;  . CYST EXCISION    . DENTAL SURGERY  01/18/15  . RIGHT HEART CATH N/A 06/12/2017    Procedure: RIGHT HEART CATH;  Surgeon: Larey Dresser, MD;  Location: [facility];  Service: Cardiovascular;  Laterality: N/A;    Prior to Admission medications   Medication Sig Start Date End Date Taking? Authorizing Provider  amLODipine (NORVASC) 5 MG tablet Take 5 mg by mouth 2 (two) times daily.    Yes [provider]  aspirin EC 81 MG tablet Take 81 mg by mouth daily.   Yes [provider]  atorvastatin (LIPITOR) 80 MG tablet Take 1 tablet (80 mg total) by mouth daily at 6 PM. 06/19/17  Yes Raiford Noble Bond, DO  calcitRIOL (ROCALTROL) 0.5 MCG capsule Take 0.5 mcg by mouth every Monday, Wednesday, and Friday.    Yes [provider]  carvedilol (COREG) 25 MG tablet Take 1 tablet (25 mg total) by mouth 2 (two) times daily with a meal. 06/19/17  Yes Sheikh, Omair Latif, DO  cloNIDine (CATAPRES) 0.2 MG tablet Take 0.2 mg by mouth daily as needed (blood pressure).    Yes [provider]  docusate sodium (COLACE) 100 MG capsule Take 1 capsule (100 mg total) by mouth 2 (two) times daily. Patient taking differently: Take 200 mg by mouth daily as needed for mild constipation.  01/28/15  Yes Donne Hazel, MD  feeding supplement, ENSURE ENLIVE, (ENSURE ENLIVE) LIQD Take 237 mLs by mouth 3 (three) times daily between meals. 06/19/17  Yes Sheikh, Omair Latif, DO  ferrous sulfate 325 (65 FE) MG tablet Take 325 mg by mouth 2 (two) times daily with a meal.    Yes [provider]  furosemide (LASIX) 80 MG tablet Take 1 tablet (80 mg total) by mouth every morning. 07/14/17  Yes Larey Dresser, MD  gabapentin (NEURONTIN) 100 MG capsule Take 100 mg by mouth daily as needed (nerve pain).    Yes [provider]  hydrALAZINE (APRESOLINE) 25 MG tablet Take 3 tablets (75 mg total) by mouth every 8 (eight) hours. Patient taking differently: Take 75 mg by mouth 3 (three) times daily.  06/19/17  Yes Sheikh, Omair Latif, DO  insulin aspart (NOVOLOG FLEXPEN) 100  UNIT/ML FlexPen Inject into the skin 3 (three) times daily with meals. Per sliding scale   Yes [provider]  insulin detemir (LEVEMIR) 100 UNIT/ML injection Inject 10-40 Units into the skin See admin instructions. 40 units in am, 1-15 units in pm   Yes [provider]  ipratropium-albuterol (DUONEB) 0.5-2.5 (3) MG/3ML SOLN Take 3 mLs by nebulization every 6 (six) hours as needed (for shortness of breath).    Yes [provider]  isosorbide mononitrate (IMDUR) 30 MG 24 hr tablet Take 1 tablet (30 mg total) by mouth daily. 06/20/17  Yes Sheikh, Omair Latif, DO  lactulose (CHRONULAC) 10 GM/15ML solution Take 45 mLs (30 g total) by mouth 2 (two) times daily. Patient taking differently: Take 10 g by mouth 2 (two) times daily.  06/19/17  Yes Sheikh, Omair Latif, DO  Omega-3 Fatty Acids (OMEGA 3 PO) Take 520 mg by mouth 2 (two) times daily.   Yes [provider]  ondansetron (ZOFRAN-ODT) 8 MG disintegrating tablet Take 8 mg by mouth daily as needed for nausea or vomiting.  11/14/14  Yes [provider]  oxyCODONE (OXY IR/ROXICODONE) 5 MG immediate release tablet Take 5 mg by mouth every 6 (six) hours as needed for severe pain.   Yes [provider]  QUEtiapine (SEROQUEL) 25 MG tablet Take 25 mg by mouth 2 (two) times daily.   Yes [provider]  sertraline (ZOLOFT) 50 MG tablet Take 3 tablets (150 mg total) by mouth daily. 06/20/17  Yes Sheikh, Omair Latif, DO  sodium chloride (OCEAN) 0.65 % SOLN nasal spray Place 1 spray into both nostrils as needed for congestion. 06/19/17  Yes Sheikh, Omair Latif, DO  traZODone (DESYREL) 100 MG tablet Take 1 tablet (100 mg total) by mouth at bedtime. 06/19/17  Yes Sheikh, Omair Latif, DO  Vitamin D, Ergocalciferol, (DRISDOL) 50000 units CAPS capsule Take 50,000 Units by mouth every 7 (seven) days.   Yes [provider]  clonazePAM (KLONOPIN) 0.5 MG tablet Take 1 tablet (0.5 mg total) by mouth daily. Patient  not taking: Reported on 08/06/2017 06/20/17   Raiford Noble Latif, DO  diclofenac sodium (VOLTAREN) 1 % GEL Apply 2 g topically 4 (four) times daily as needed (Pain). Patient not taking: Reported on 08/06/2017 06/19/17   Raiford Noble Latif, DO  guaiFENesin (MUCINEX) 600 MG 12 hr tablet Take 2 tablets (1,200 mg total) by mouth 2 (two) times daily. Patient not taking: Reported on 08/06/2017 06/19/17   Kerney Elbe, DO    Current Facility-Administered Medications  Medication Dose Route Frequency Provider Last Rate Last Dose  . acetaminophen (TYLENOL) tablet 650 mg  650 mg Oral Q6H PRN Elwin Mocha, MD       Or  . acetaminophen (TYLENOL) suppository 650 mg  650 mg Rectal Q6H PRN Elwin Mocha, MD      . amLODipine (NORVASC) tablet 5 mg  5 mg Oral BID Elwin Mocha, MD   5 mg at 08/07/17 3536  . aspirin EC tablet 81 mg  81 mg Oral Daily Elwin Mocha, MD   81 mg at 08/07/17 1443  . atorvastatin (LIPITOR) tablet 80 mg  80 mg Oral q1800 Elwin Mocha, MD   80 mg at 08/06/17 2153  . calcitRIOL (ROCALTROL) capsule 0.5 mcg  0.5 mcg Oral Q M,W,F Elwin Mocha, MD   0.5 mcg at 08/07/17 0831  . carvedilol (COREG) tablet 25 mg  25 mg Oral BID WC Elwin Mocha, MD   25 mg at 08/07/17 1540  . docusate sodium (COLACE) capsule 200 mg  200 mg Oral Daily PRN Elwin Mocha, MD      . feeding supplement (ENSURE ENLIVE) (ENSURE ENLIVE) liquid 237 mL  237 mL Oral TID BM Elwin Mocha, MD   237 mL at 08/06/17 2206  . ferrous sulfate tablet 325 mg  325 mg Oral BID WC Elwin Mocha, MD   325 mg at 08/07/17 0834  . furosemide (LASIX) injection 80 mg  80 mg Intravenous TID Elwin Mocha, MD   80 mg at 08/07/17 0867  . gabapentin (NEURONTIN) capsule 100 mg  100 mg Oral Daily PRN Elwin Mocha, MD      . heparin injection 5,000 Units  5,000 Units Subcutaneous Q8H Elwin Mocha, MD   5,000 Units at 08/07/17 [phone]  . hydrALAZINE (APRESOLINE) tablet 75 mg  75 mg Oral TID Elwin Mocha,  MD   75 mg at 08/07/17 0932  . insulin aspart (novoLOG) injection 0-9 Units  0-9 Units Subcutaneous TID WC Elwin Mocha, MD   1 Units at 08/07/17 [phone]  . ipratropium-albuterol (DUONEB) 0.5-2.5 (3) MG/3ML nebulizer solution 3 mL  3 mL Nebulization TID Elwin Mocha, MD   3 mL at 08/07/17 0850  . isosorbide mononitrate (IMDUR) 24 hr tablet 30 mg  30 mg Oral Daily Elwin Mocha, MD   30 mg at 08/07/17 4580  . lactulose (CHRONULAC) 10 GM/15ML solution 10 g  10 g Oral BID Elwin Mocha, MD   10 g at 08/07/17 9983  . LORazepam (ATIVAN) injection 1-2 mg  1-2 mg Intravenous Q4H PRN Elwin Mocha, MD      . omega-3 acid ethyl esters (LOVAZA) capsule 1 g  1 g Oral BID Velvet Bathe, MD   1 g at 08/07/17 [phone]  . ondansetron (ZOFRAN) tablet 4 mg  4 mg Oral Q6H PRN Elwin Mocha, MD       Or  . ondansetron [facility]) injection 4 mg  4 mg Intravenous Q6H PRN Elwin Mocha, MD      . oxyCODONE-acetaminophen (PERCOCET/ROXICET) 5-325 MG per tablet 1 tablet  1 tablet Oral Once Elwin Mocha, MD      . QUEtiapine (SEROQUEL) tablet 25 mg  25 mg Oral BID Elwin Mocha, MD   25 mg at 08/07/17 0539  . sertraline (ZOLOFT) tablet 150 mg  150 mg Oral Daily Elwin Mocha, MD   150 mg at 08/07/17 0831  . sodium chloride (OCEAN) 0.65 % nasal spray 1 spray  1 spray Each Nare PRN Elwin Mocha, MD      . sodium chloride flush (NS) 0.9 % injection 3 mL  3 mL Intravenous Q12H Benjaman Lobe  M, MD   3 mL at 08/07/17 0846  . traZODone (DESYREL) tablet 100 mg  100 mg Oral QHS Elwin Mocha, MD   100 mg at 08/06/17 2153    Allergies as of 08/06/2017 - Review Complete 08/06/2017  Allergen Reaction Noted  . Phenytoin sodium extended Itching 08/07/2015  . Potassium-containing compounds Other (See Comments) 08/06/2017  . Latex Swelling 01/22/2015    Family History  Problem Relation Age of Onset  . Heart failure Mother   . Liver cancer Father   . Lung cancer Father   . Cancer Sister   . Heart  murmur Brother     Social History   Socioeconomic History  . Marital status: Widowed    Spouse name: Not on file  . Number of children: Not on file  . Years of education: Not on file  . Highest education level: Not on file  Occupational History  . Not on file  Social Needs  . Financial resource strain: Not on file  . Food insecurity:    Worry: Not on file    Inability: Not on file  . Transportation needs:    Medical: Not on file    Non-medical: Not on file  Tobacco Use  . Smoking status: Never Smoker  . Smokeless tobacco: Never Used  Substance and Sexual Activity  . Alcohol use: No    Alcohol/week: 0.0 oz  . Drug use: No  . Sexual activity: Not on file  Lifestyle  . Physical activity:    Days per week: Not on file    Minutes per session: Not on file  . Stress: Not on file  Relationships  . Social connections:    Talks on phone: Not on file    Gets together: Not on file    Attends religious service: Not on file    Active member of club or organization: Not on file    Attends meetings of clubs or organizations: Not on file    Relationship status: Not on file  . Intimate partner violence:    Fear of current or ex partner: Not on file    Emotionally abused: Not on file    Physically abused: Not on file    Forced sexual activity: Not on file  Other Topics Concern  . Not on file  Social History Narrative  . Not on file    Review of Systems: Gen: Denies any fever, chills, sweats, anorexia, fatigue, weakness, malaise, weight loss, and sleep disorder HEENT: No visual complaints, No history of Retinopathy. Normal external appearance No Epistaxis or Sore throat. No sinusitis.   CV: Denies chest pain, angina, palpitations, syncope, orthopnea, PND, peripheral edema, and claudication. Resp: Denies dyspnea at rest, dyspnea with exercise, cough, sputum, wheezing, coughing up blood, and pleurisy. GI: Denies vomiting blood, jaundice, and fecal incontinence.   Denies dysphagia  or odynophagia. GU : Denies urinary burning, blood in urine, urinary frequency, urinary hesitancy, nocturnal urination, and urinary incontinence.  No renal calculi. MS: Denies joint pain, limitation of movement, and swelling, stiffness, low back pain, extremity pain. Denies muscle weakness, cramps, atrophy.  No use of non steroidal antiinflammatory drugs. Derm: Denies rash, itching, dry skin, hives, moles, warts, or unhealing ulcers.  Psych: Denies depression, anxiety, memory loss, suicidal ideation, hallucinations, paranoia, and confusion. Heme: Denies bruising, bleeding, and enlarged lymph nodes. Neuro: No headache.  No diplopia. No dysarthria.  No dysphasia.  No history of CVA.  No Seizures. No paresthesias.  No weakness. Endocrine  DM.  No Thyroid disease.  No Adrenal disease.  Physical Exam: Vital signs in last 24 hours: Temp:  [97.9 F (36.6 C)-98.8 F (37.1 C)] 98.8 F (37.1 C) (03/25 0429) Pulse Rate:  [60-69] 60 (03/25 0837) Resp:  [14-20] 18 (03/25 0429) BP: (128-162)/(40-58) 140/58 (03/25 0837) SpO2:  [87 %-96 %] 96 % (03/25 0429) Weight:  [156 lb 1.6 oz (70.8 kg)-169 lb 6 oz (76.8 kg)] 156 lb 1.6 oz (70.8 kg) (03/25 0429) Last BM Date: 08/06/17 General:   Alert,  Well-developed, well-nourished, pleasant and cooperative in NAD Head:  Normocephalic and atraumatic. Eyes:  Sclera clear, no icterus.   Conjunctiva pink. Ears:  Normal auditory acuity. Nose:  No deformity, discharge,  or lesions. Mouth:  No deformity or lesions, dentition normal. Neck:  Supple; no masses or thyromegaly. JVP not elevated Lungs:  Clear throughout to auscultation.   No wheezes, crackles, or rhonchi. No acute distress. Heart:  Regular rate and rhythm; no murmurs, clicks, rubs,  or gallops. Abdomen:  Soft, nontender and nondistended. No masses, hepatosplenomegaly or hernias noted. Normal bowel sounds, without guarding, and without rebound.   Msk:  Symmetrical without gross deformities. Normal  posture. Pulses:  No carotid, renal, femoral bruits. DP and PT symmetrical and equal Extremities:  Without clubbing or edema. Neurologic:  Alert and  oriented x4;  grossly normal neurologically. Skin:  Intact without significant lesions or rashes. Cervical Nodes:  No significant cervical adenopathy. Psych:  Alert and cooperative. Normal mood and affect.  Intake/Output from previous day: 03/24 0701 - 03/25 0700 In: 360 [P.O.:300; I.V.:3; IV Piggyback:57] Out: 700 [Urine:700] Intake/Output this shift: Total I/O In: 120 [P.O.:120] Out: 700 [Urine:700]  Lab Results: Recent Labs    08/06/17 1006 08/07/17 0620  WBC 3.7* 2.3*  HGB 8.5* 7.8*  HCT 27.2* 25.1*  PLT 111* 112*   BMET Recent Labs    08/06/17 1006 08/06/17 1604 08/07/17 0620  NA 135  --  138  K 4.3  --  3.7  CL 102  --  103  CO2 22  --  25  GLUCOSE 122*  --  165*  BUN 53*  --  54*  CREATININE 3.75*  --  3.45*  CALCIUM 8.8*  --  8.5*  PHOS  --  4.3  --    LFT No results for input(s): PROT, ALBUMIN, AST, ALT, ALKPHOS, BILITOT, BILIDIR, IBILI in the last 72 hours. PT/INR No results for input(s): LABPROT, INR in the last 72 hours. Hepatitis Panel No results for input(s): HEPBSAG, HCVAB, HEPAIGM, HEPBIGM in the last 72 hours.  Studies/Results: Dg Chest 2 View  Result Date: 08/06/2017 CLINICAL DATA:  Lower extremity edema for 5 days, fluid buildup, shortness of breath overnight, history CHF, diabetes mellitus, hypertension EXAM: CHEST - 2 VIEW COMPARISON:  06/19/2017 FINDINGS: Enlargement of cardiac silhouette with pulmonary vascular congestion. Mediastinal contours normal. Bibasilar small pleural effusions. No acute infiltrate or pneumothorax. Bones demineralized. IMPRESSION: Enlargement of cardiac silhouette with pulmonary vascular congestion. Small bibasilar pleural effusions. Electronically Signed   By: Lavonia Dana M.D.   On: 08/06/2017 11:42   Dg Abd 1 View  Result Date: 08/06/2017 CLINICAL DATA:  Umbilical  pain. EXAM: ABDOMEN - 1 VIEW COMPARISON:  None. FINDINGS: Bowel gas pattern is nonobstructive. No mass or mass effect. No free peritoneal air. Few pelvic phleboliths are present. There is mild degenerate change of the spine and hips. IMPRESSION: Nonobstructive bowel gas pattern. Electronically Signed   By: Marin Olp M.D.   On: 08/06/2017 16:57  US Renal  Result Date: 08/06/2017 CLINICAL DATA:  Acute renal insufficiency. EXAM: RENAL / URINARY TRACT ULTRASOUND COMPLETE COMPARISON:  CT 06/17/2017 FINDINGS: Right Kidney: Length: 11.6 cm. Echogenicity within normal limits. Mild prominence of the central intrarenal collecting system and renal pelvis. No focal mass. Left Kidney: Length: 8.4 cm. Echogenicity within normal limits. No mass or hydronephrosis visualized. Bladder: Appears normal for degree of bladder distention. Ureteral jets not visualized. Ascites. IMPRESSION: Mild size discrepancy with slightly small left kidney. Mild dilatation of the central right intrarenal collecting system/renal pelvis. Electronically Signed   By: Marin Olp M.D.   On: 08/06/2017 16:56    Assessment/Plan:  Acute on chronic renal disease stage 4 most likely from diuresis for increased dyspnea  Renal asymmetry that suggests renal vascular pathology  Agree that there is diastolic dysfunction and that control of blood pressure and diet is critical.   Acute decompensation of heart failure will need evaluation of potential etiologies  Agree that a TSH and anemia work up would be recommended  Chronic renal disease stage 4  Needs to have vein mapping and may help to have VVS see for fistula placement   Bones check PTH and follow bone mineral labs  Anemia  Will check iron studies  She may need evaluation for GI blood loss   Thank you for allowing [place] to help care for this patient     LOS: 1 Shaunte Weissinger W @TODAY @9 :55 AM

## 2017-08-07 NOTE — Consult Note (Addendum)
Advanced Heart Failure Team Consult Note   Primary Physician: Jacqualine Code, DO PCP-Cardiologist:  Carlyle Dolly, MD  Reason for Consultation: Acute on chronic diastolic CHF  HPI:    Stacy Smith is seen today for evaluation of Acute on chronic diastolic CHF at the request of Dr. Wendee Beavers.   Stacy Smith is a 72 y.o. female with CKD stage IV, Diastolic CHF, HTN, Cirrhosis, DM, Depression, and Pulmonary HTN.f Acute on chronic diastolic CHF at the request of Dr. Wendee Beavers.   Stacy Smith is a 73 y.o. female with CKD stage IV, Diastolic CHF, HTN, Cirrhosis, DM, Depression, and Pulmonary HTN.   Recently admitted 1/26 - 06/19/16 with acute/chronic diastolic CHF. Diuresed to discharge weight of 158 lbs.   She was seen in HF clinic 07/20/17. Lasix had been increased, and then decreased again with AKI. No med changes with AKI. BMET stable that day. Left off of K. Weight at that visit 159 lb.  She called HF clinic 07/31/17 with 3 lb weight gain. Instructed to take an extra dose of lasix.   She increased her lasix from daily to 80 mg BID for 3 days without result, so reported to ED 08/06/17. She has been more SOB with coughing since Tuesday of last week. Cough with clear to yellow production. Edema has gone up through her legs and into her back and sides. Pertinent labs on admission include Creatinine 3.75 (up from 2.5), K 4.3, BNP 953.7, WBC 3.7, Hgb 8.5. UA clear but with ++ protein. CXR with vascular congestion.  Given dose of 120 mg IV lasix then started on 80 mg TID.   She is feeling OK this afternoon. Remains fatigued and orthopneic. Denies CP. No fevers or wheezing. She is more limited her swelling and discomfort than SOB. She is able to do her ADLs at home. She has noticed her UOP tapering off over the past week, but now picking back up on IV lasix. She has been taking all medications as directed, including 3 days of extra lasix. She is limiting salt and fluid intake.   Echo 08/07/17 LVEF 55-60%, Grade 2 DD, Mild/Mod LAE, Mod RV, Mild/Mod TR, PA peak pressure 50 mm Hg. Personally reviewed   Review of Systems: [y] = yes, [ ]  = no   General: Weight gain [y]; Weight  loss [ ] ; Anorexia [ ] ; Fatigue Blue.Reese ]; Fever [ ] ; Chills [ ] ; Weakness [y]  Cardiac: Chest pain/pressure [ ] ; Resting SOB [ ] ; Exertional SOB [y]; Orthopnea [y]; Pedal Edema [y]; Palpitations [ ] ; Syncope [ ] ; Presyncope [ ] ; Paroxysmal nocturnal dyspnea[ ]   Pulmonary: Cough [ ] ; Wheezing[ ] ; Hemoptysis[ ] ; Sputum [ ] ; Snoring [ ]   GI: Vomiting[ ] ; Dysphagia[ ] ; Melena[ ] ; Hematochezia [ ] ; Heartburn[ ] ; Abdominal pain [ ] ; Constipation [ ] ; Diarrhea [ ] ; BRBPR [ ]   GU: Hematuria[ ] ; Dysuria [ ] ; Nocturia[ ]   Vascular: Pain in legs with walking [ ] ; Pain in feet with lying flat [ ] ; Non-healing sores [ ] ; Stroke [ ] ; TIA [ ] ; Slurred speech [ ] ;  Neuro: Headaches[ ] ; Vertigo[ ] ; Seizures[ ] ; Paresthesias[ ] ;Blurred vision [ ] ; Diplopia [ ] ; Vision changes [ ]   Ortho/Skin: Arthritis [y]; Joint pain [y]; Muscle pain [ ] ; Joint swelling [ ] ; Back Pain [ ] ; Rash [ ]   Psych: Depression[ ] ; Anxiety[ ]   Heme: Bleeding problems [ ] ; Clotting disorders [ ] ; Anemia [ ]   Endocrine: Diabetes [ ] ; Thyroid dysfunction[ ]   Home Medications Prior to Admission medications   Medication Sig Start Date End Date Taking? Authorizing Provider  amLODipine (NORVASC) 5 MG tablet Take 5 mg by mouth 2 (two) times daily.  Yes [provider]  aspirin EC 81 MG tablet Take 81 mg by mouth daily.   Yes [provider]  atorvastatin (LIPITOR) 80 MG tablet Take 1 tablet (80 mg total) by mouth daily at 6 PM. 06/19/17  Yes Raiford Noble [place], DO  calcitRIOL (ROCALTROL) 0.5 MCG capsule Take 0.5 mcg by mouth every Monday, Wednesday, and Friday.    Yes [provider]  carvedilol (COREG) 25 MG tablet Take 1 tablet (25 mg total) by mouth 2 (two) times daily with a meal. 06/19/17  Yes Sheikh, Omair Latif, DO  cloNIDine (CATAPRES) 0.2 MG tablet Take 0.2 mg by mouth daily as needed (blood pressure).    Yes [provider]  docusate sodium (COLACE) 100 MG capsule Take 1 capsule (100 mg total) by mouth 2  (two) times daily. Patient taking differently: Take 200 mg by mouth daily as needed for mild constipation.  01/28/15  Yes Donne Hazel, MD  feeding supplement, ENSURE ENLIVE, (ENSURE ENLIVE) LIQD Take 237 mLs by mouth 3 (three) times daily between meals. 06/19/17  Yes Sheikh, Omair Latif, DO  ferrous sulfate 325 (65 FE) MG tablet Take 325 mg by mouth 2 (two) times daily with a meal.    Yes [provider]  furosemide (LASIX) 80 MG tablet Take 1 tablet (80 mg total) by mouth every morning. 07/14/17  Yes Larey Dresser, MD  gabapentin (NEURONTIN) 100 MG capsule Take 100 mg by mouth daily as needed (nerve pain).    Yes [provider]  hydrALAZINE (APRESOLINE) 25 MG tablet Take 3 tablets (75 mg total) by mouth every 8 (eight) hours. Patient taking differently: Take 75 mg by mouth 3 (three) times daily.  06/19/17  Yes Sheikh, Omair Latif, DO  insulin aspart (NOVOLOG FLEXPEN) 100 UNIT/ML FlexPen Inject into the skin 3 (three) times daily with meals. Per sliding scale   Yes [provider]  insulin detemir (LEVEMIR) 100 UNIT/ML injection Inject 10-40 Units into the skin See admin instructions. 40 units in am, 1-15 units in pm   Yes [provider]  ipratropium-albuterol (DUONEB) 0.5-2.5 (3) MG/3ML SOLN Take 3 mLs by nebulization every 6 (six) hours as needed (for shortness of breath).    Yes [provider]  isosorbide mononitrate (IMDUR) 30 MG 24 hr tablet Take 1 tablet (30 mg total) by mouth daily. 06/20/17  Yes Sheikh, Omair Latif, DO  lactulose (CHRONULAC) 10 GM/15ML solution Take 45 mLs (30 g total) by mouth 2 (two) times daily. Patient taking differently: Take 10 g by mouth 2 (two) times daily.  06/19/17  Yes Sheikh, Omair Latif, DO  Omega-3 Fatty Acids (OMEGA 3 PO) Take 520 mg by mouth 2 (two) times daily.   Yes [provider]  ondansetron (ZOFRAN-ODT) 8 MG disintegrating tablet Take 8 mg by mouth daily as needed for nausea or vomiting.  11/14/14  Yes  [provider]  oxyCODONE (OXY IR/ROXICODONE) 5 MG immediate release tablet Take 5 mg by mouth every 6 (six) hours as needed for severe pain.   Yes [provider]  QUEtiapine (SEROQUEL) 25 MG tablet Take 25 mg by mouth 2 (two) times daily.   Yes [provider]  sertraline (ZOLOFT) 50 MG tablet Take 3 tablets (150 mg total) by mouth daily. 06/20/17  Yes Sheikh, Omair Latif, DO  sodium chloride (OCEAN) 0.65 % SOLN nasal spray Place 1 spray into both nostrils as needed for congestion. 06/19/17  Yes Sheikh, Omair Latif, DO  traZODone (DESYREL) 100  MG tablet Take 1 tablet (100 mg total) by mouth at bedtime. 06/19/17  Yes Sheikh, Omair Latif, DO  Vitamin D, Ergocalciferol, (DRISDOL) 50000 units CAPS capsule Take 50,000 Units by mouth every 7 (seven) days.   Yes [provider]  clonazePAM (KLONOPIN) 0.5 MG tablet Take 1 tablet (0.5 mg total) by mouth daily. Patient not taking: Reported on 08/06/2017 06/20/17   Raiford Noble Latif, DO  diclofenac sodium (VOLTAREN) 1 % GEL Apply 2 g topically 4 (four) times daily as needed (Pain). Patient not taking: Reported on 08/06/2017 06/19/17   Raiford Noble Latif, DO  guaiFENesin (MUCINEX) 600 MG 12 hr tablet Take 2 tablets (1,200 mg total) by mouth 2 (two) times daily. Patient not taking: Reported on 08/06/2017 06/19/17   Kerney Elbe, DO   Past Medical History: Past Medical History:  Diagnosis Date  . Anxiety   . Bradycardia   . CHF (congestive heart failure) ([place])   . Chronic kidney disease    CKD Stage 3  . Depression   . Diabetes mellitus without complication ([place])   . Hyperkalemia   . Hypertension   . Lupus   . Peripheral neuropathy   . Rheumatoid arthritis ([place])   . Seizures ([place])   . Shortness of breath dyspnea    Past Surgical History: Past Surgical History:  Procedure Laterality Date  . ABDOMINAL HYSTERECTOMY    . APPENDECTOMY    . CARDIAC CATHETERIZATION N/A 05/18/2016   Procedure: Right Heart Cath;   Surgeon: Burnell Blanks, MD;  Location: [facility];  Service: Cardiovascular;  Laterality: N/A;  . CYST EXCISION    . DENTAL SURGERY  01/18/15  . RIGHT HEART CATH N/A 06/12/2017   Procedure: RIGHT HEART CATH;  Surgeon: Larey Dresser, MD;  Location: [facility];  Service: Cardiovascular;  Laterality: N/A;   Family History: Family History  Problem Relation Age of Onset  . Heart failure Mother   . Liver cancer Father   . Lung cancer Father   . Cancer Sister   . Heart murmur Brother    Social History: Social History   Socioeconomic History  . Marital status: Widowed    Spouse name: Not on file  . Number of children: Not on file  . Years of education: Not on file  . Highest education level: Not on file  Occupational History  . Not on file  Social Needs  . Financial resource strain: Not on file  . Food insecurity:    Worry: Not on file    Inability: Not on file  . Transportation needs:    Medical: Not on file    Non-medical: Not on file  Tobacco Use  . Smoking status: Never Smoker  . Smokeless tobacco: Never Used  Substance and Sexual Activity  . Alcohol use: No    Alcohol/week: 0.0 oz  . Drug use: No  . Sexual activity: Not on file  Lifestyle  . Physical activity:    Days per week: Not on file    Minutes per session: Not on file  . Stress: Not on file  Relationships  . Social connections:    Talks on phone: Not on file    Gets together: Not on file    Attends religious service: Not on file    Active member of club or organization: Not on file    Attends meetings of clubs or organizations: Not on file    Relationship status: Not on file  Other Topics  Concern  . Not on file  Social History Narrative  . Not on file   Allergies:  Allergies  Allergen Reactions  . Phenytoin Sodium Extended Itching  . Potassium-Containing Compounds Other (See Comments)    unspecified  . Latex Swelling   Objective:    Vital Signs:   Temp:  [97.9 F  (36.6 C)-98.8 F (37.1 C)] 98.6 F (37 C) (03/25 1142) Pulse Rate:  [60-69] 61 (03/25 1142) Resp:  [14-19] 18 (03/25 1142) BP: (128-162)/(40-58) 131/45 (03/25 1142) SpO2:  [87 %-96 %] 93 % (03/25 1142) Weight:  [156 lb 1.6 oz (70.8 kg)-158 lb 12.8 oz (72 kg)] 156 lb 1.6 oz (70.8 kg) (03/25 0429) Last BM Date: 08/06/17  Weight change: Filed Weights   08/06/17 0958 08/06/17 1854 08/07/17 0429  Weight: 160 lb 6 oz (72.7 kg) 158 lb 12.8 oz (72 kg) 156 lb 1.6 oz (70.8 kg)   Intake/Output:   Intake/Output Summary (Last 24 hours) at 08/07/2017 1325 Last data filed at 08/07/2017 1317 Gross per 24 hour  Intake 480 ml  Output 1650 ml  Net -1170 ml    Physical Exam    General:  Sitting up in bed. Elderly and fatigued appearing. NAD.  HEENT: normal anicteric  Neck: supple. JVP to ear. Carotids 2+ bilat; no bruits. No lymphadenopathy or thyromegaly appreciated. Cor: PMI nondisplaced. Regular. Somewhat brady. No rubs, gallops or murmurs. Lungs: Diminished basilar sounds. No wheeze Abdomen: soft, nontender, nondistended. No hepatosplenomegaly. No bruits or masses. Good bowel sounds. Extremities: no cyanosis, clubbing, or rash. 2+ dependent edema up into flanks.  Neuro: alert & orientedx3, cranial nerves grossly intact. moves all 4 extremities w/o difficulty. Affect pleasant  Telemetry   NSR 60s, personally reviewed.  EKG    08/06/17 NSR 60 bpm, personally reviewed.  Labs   Basic Metabolic Panel: Recent Labs  Lab 08/06/17 1006 08/06/17 1604 08/07/17 0620  NA 135  --  138  K 4.3  --  3.7  CL 102  --  103  CO2 22  --  25  GLUCOSE 122*  --  165*  BUN 53*  --  54*  CREATININE 3.75*  --  3.45*  CALCIUM 8.8*  --  8.5*  MG  --  2.6*  --   PHOS  --  4.3  --     Liver Function Tests: No results for input(s): AST, ALT, ALKPHOS, BILITOT, PROT, ALBUMIN in the last 168 hours. No results for input(s): LIPASE, AMYLASE in the last 168 hours. No results for input(s): AMMONIA in the  last 168 hours.  CBC: Recent Labs  Lab 08/06/17 1006 08/07/17 0620  WBC 3.7* 2.3*  HGB 8.5* 7.8*  HCT 27.2* 25.1*  MCV 91.3 89.3  PLT 111* 112*    Cardiac Enzymes: Recent Labs  Lab 08/06/17 1604  TROPONINI <0.03    BNP: BNP (last 3 results) Recent Labs    06/11/17 0632 06/26/17 1424 08/06/17 1428  BNP 470.6* 792.7* 953.7*    ProBNP (last 3 results) No results for input(s): PROBNP in the last 8760 hours.   CBG: Recent Labs  Lab 08/07/17 0806 08/07/17 1238  GLUCAP 137* 196*    Coagulation Studies: No results for input(s): LABPROT, INR in the last 72 hours.   Imaging   Dg Abd 1 View  Result Date: 08/06/2017 CLINICAL DATA:  Umbilical pain. EXAM: ABDOMEN - 1 VIEW COMPARISON:  None. FINDINGS: Bowel gas pattern is nonobstructive. No mass or mass effect. No free peritoneal air. Few pelvic  phleboliths are present. There is mild degenerate change of the spine and hips. IMPRESSION: Nonobstructive bowel gas pattern. Electronically Signed   By: Marin Olp M.D.   On: 08/06/2017 16:57   US Renal  Result Date: 08/06/2017 CLINICAL DATA:  Acute renal insufficiency. EXAM: RENAL / URINARY TRACT ULTRASOUND COMPLETE COMPARISON:  CT 06/17/2017 FINDINGS: Right Kidney: Length: 11.6 cm. Echogenicity within normal limits. Mild prominence of the central intrarenal collecting system and renal pelvis. No focal mass. Left Kidney: Length: 8.4 cm. Echogenicity within normal limits. No mass or hydronephrosis visualized. Bladder: Appears normal for degree of bladder distention. Ureteral jets not visualized. Ascites. IMPRESSION: Mild size discrepancy with slightly small left kidney. Mild dilatation of the central right intrarenal collecting system/renal pelvis. Electronically Signed   By: Marin Olp M.D.   On: 08/06/2017 16:56     Medications:    Current Medications: . amLODipine  5 mg Oral BID  . aspirin EC  81 mg Oral Daily  . atorvastatin  80 mg Oral q1800  . calcitRIOL  0.5 mcg  Oral Q M,W,F  . carvedilol  25 mg Oral BID WC  . feeding supplement (ENSURE ENLIVE)  237 mL Oral TID BM  . ferrous sulfate  325 mg Oral BID WC  . furosemide  80 mg Intravenous TID  . heparin  5,000 Units Subcutaneous Q8H  . hydrALAZINE  75 mg Oral TID  . insulin aspart  0-9 Units Subcutaneous TID WC  . ipratropium-albuterol  3 mL Nebulization TID  . isosorbide mononitrate  30 mg Oral Daily  . lactulose  10 g Oral BID  . omega-3 acid ethyl esters  1 g Oral BID  . oxyCODONE-acetaminophen  1 tablet Oral Once  . QUEtiapine  25 mg Oral BID  . sertraline  150 mg Oral Daily  . sodium chloride flush  3 mL Intravenous Q12H  . traZODone  100 mg Oral QHS     Infusions:   Patient Profile   DENNISSE SWADER is a 72 y.o. female with CKD stage IV, Diastolic CHF, HTN, Cirrhosis, DM, Depression, and Pulmonary HTN.   Admitted 08/06/17 with volume overload and AKI.   Assessment/Plan   1. Acute on chronic diastolic CHF - Echo 3/81/01 LVEF 55-60%, Grade 2 DD, Mild/Mod LAE, Mod RV, Mild/Mod TR, PA peak pressure 50 mm Hg.  - RHC 06/12/17 with pulmonary venous hypertension and elevated filling pressures with PVR about 2 WU. - Volume status markedly elevated on exam. - Continue IV lasix 80 mg TID with OK response so far. If does not improve, may need to change to lasix gtt.  - Have previously discussed [facility]io[facility] for Mrs Joshi, but if leaning torwards HD would likely be poor candidate.  - Continue compression hose. - Needs TFTs - Check SPEP/UPEP.   2. Pulmonary HTN - Primarily venous hypertension based on RHC.  - Diuresis is priority.  - No role for selective pulmonary vasodilators at this point.   3. ARF on CKD stage IV:  - Creatinine elevated to 3.4 today. - Nephrology following and working up further.  - Vein mapping ordered. May be getting close to HD.  - US renal 08/06/17 with slightly smaller left kidney. Mild dilatation of the central right intrarenal collecting system/renal pelvis.   4.  HTN:  - Would aim for 120-130s with CKD-IV. Meds as above.   5. Cirrhosis:  - Negative workup for viral and autoimmune hepatitis.  She does not drink ETOH.  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  -  Most recent Ammonia 37 07/14/17.   6. Depression - Per primary. On Zoloft.   7. Acute hypoxic respiratory failure - Requiring O2 via [place] at 2 L. Not on chronic O2.  - Will need to be walked for ?O2 requirement once more stable.  8. Acute on chronic anemia - Hgb 7.8 this am.  - Suspect CKD playing a role. No active bleeding.  - Iron stores low-normal with low TIBC.  - ? If needs EPO.  9. ID - WBC 2.3. Pt states she is having chills, and feels hot to the touch.  - Check temperature.   Medication concerns reviewed with patient and pharmacy team. Barriers identified: None at this time.   Length of Stay: 1  Annamaria Helling  08/07/2017, 1:25 PM  Advanced Heart Failure Team Pager [phone] (M-F; 7a - 4p)  Please contact [facility] for night-coverage after hours (4p -7a ) and weekends on [email or web address]  Patient seen and examined with the above-signed Advanced Practice Provider and/or Housestaff. I personally reviewed laboratory data, imaging studies and relevant notes. I independently examined the patient and formulated the important aspects of the plan. I have edited the note to reflect any of my changes or salient points. I have personally discussed the plan with the patient and/or family.  72 y/o woman with diastolic HF and advanced CKD. Now admitted with recurrent volume overload and AKI. No ischemic symptoms. Echo reviewed personally and EF 55-60% with grade II DD. Suspect main issue here is worsening renal function. Fortunately, she is responding to IV lasix. We will continue.  Nephrology is seeing and planning for vein mapping. Agree with w/u for amyloid but likely HTN/DM nephropathy. Remains anemic and would likely benefit from Aranesp.   Differential kidney size raises  question of RAS but issue likely moot at this point. Recent abdominal CT shows significant hepatic cirrhosis with extensive abdominal varices. Unclear etiology.    Glori Bickers, MD  8:27 PM

## 2017-08-08 ENCOUNTER — Inpatient Hospital Stay (HOSPITAL_COMMUNITY): Payer: Medicare HMO

## 2017-08-08 DIAGNOSIS — N184 Chronic kidney disease, stage 4 (severe): Secondary | ICD-10-CM

## 2017-08-08 LAB — CBC
HEMATOCRIT: 26.2 % — AB (ref 36.0–46.0)
HEMOGLOBIN: 8.2 g/dL — AB (ref 12.0–15.0)
MCH: 27.5 pg (ref 26.0–34.0)
MCHC: 31.3 g/dL (ref 30.0–36.0)
MCV: 87.9 fL (ref 78.0–100.0)
PLATELETS: 112 10*3/uL — AB (ref 150–400)
RBC: 2.98 MIL/uL — ABNORMAL LOW (ref 3.87–5.11)
RDW: 17.2 % — ABNORMAL HIGH (ref 11.5–15.5)
WBC: 2.2 10*3/uL — AB (ref 4.0–10.5)

## 2017-08-08 LAB — GLUCOSE, CAPILLARY
GLUCOSE-CAPILLARY: 158 mg/dL — AB (ref 65–99)
GLUCOSE-CAPILLARY: 147 mg/dL — AB (ref 65–99)
GLUCOSE-CAPILLARY: 202 mg/dL — AB (ref 65–99)
Glucose-Capillary: 197 mg/dL — ABNORMAL HIGH (ref 65–99)

## 2017-08-08 LAB — T4, FREE: FREE T4: 0.59 ng/dL — AB (ref 0.61–1.12)

## 2017-08-08 LAB — RENAL FUNCTION PANEL
Albumin: 3 g/dL — ABNORMAL LOW (ref 3.5–5.0)
Anion gap: 15 (ref 5–15)
BUN: 48 mg/dL — AB (ref 6–20)
CHLORIDE: 102 mmol/L (ref 101–111)
CO2: 24 mmol/L (ref 22–32)
CREATININE: 2.9 mg/dL — AB (ref 0.44–1.00)
Calcium: 9 mg/dL (ref 8.9–10.3)
GFR calc Af Amer: 18 mL/min — ABNORMAL LOW (ref >60)
GFR, EST NON AFRICAN AMERICAN: 15 mL/min — AB (ref >60)
GLUCOSE: 176 mg/dL — AB (ref 65–99)
Phosphorus: 4.1 mg/dL (ref 2.5–4.6)
Potassium: 3.2 mmol/L — ABNORMAL LOW (ref 3.5–5.1)
Sodium: 141 mmol/L (ref 135–145)

## 2017-08-08 LAB — PARATHYROID HORMONE, INTACT (NO CA): PTH: 111 pg/mL — AB (ref 15–65)

## 2017-08-08 MED ORDER — POTASSIUM CHLORIDE CRYS ER 20 MEQ PO TBCR
20.0000 meq | EXTENDED_RELEASE_TABLET | Freq: Once | ORAL | Status: AC
  Administered 2017-08-08: 20 meq via ORAL
  Filled 2017-08-08: qty 1

## 2017-08-08 MED ORDER — SODIUM CHLORIDE 0.9 % IV SOLN
125.0000 mg | Freq: Every day | INTRAVENOUS | Status: DC
  Administered 2017-08-08 – 2017-08-11 (×4): 125 mg via INTRAVENOUS
  Filled 2017-08-08 (×7): qty 10

## 2017-08-08 MED ORDER — DARBEPOETIN ALFA 40 MCG/0.4ML IJ SOSY
40.0000 ug | PREFILLED_SYRINGE | INTRAMUSCULAR | Status: DC
Start: 2017-08-08 — End: 2017-08-11
  Administered 2017-08-08: 40 ug via SUBCUTANEOUS
  Filled 2017-08-08 (×2): qty 0.4

## 2017-08-08 MED ORDER — IPRATROPIUM-ALBUTEROL 0.5-2.5 (3) MG/3ML IN SOLN: 3.0000 mL | Freq: Four times a day (QID) | RESPIRATORY_TRACT | Status: DC | PRN

## 2017-08-08 MED ORDER — POTASSIUM CHLORIDE CRYS ER 20 MEQ PO TBCR
40.0000 meq | EXTENDED_RELEASE_TABLET | Freq: Once | ORAL | Status: AC
  Administered 2017-08-08: 40 meq via ORAL
  Filled 2017-08-08: qty 2

## 2017-08-08 MED ORDER — POTASSIUM CHLORIDE CRYS ER 20 MEQ PO TBCR: 40.0000 meq | EXTENDED_RELEASE_TABLET | Freq: Once | ORAL | Status: DC

## 2017-08-08 NOTE — Progress Notes (Signed)
PROGRESS NOTE    Stacy Smith  NID:[id number] DOB: [birth year] DOA: 08/06/2017 PCP: Jacqualine Code, DO    Brief Narrative:  Stacy Smith is a 72 y.o. female with CKD stage IV, Diastolic CHF, HTN, Cirrhosis, DM, Depression, and Pulmonary HTN.   Pt was recently admitted 1/26 - 06/19/16 with acute/chronic diastolic CHF. Diuresed to discharge weight of 158 lbs.   Pt on IV lasix 80 mg IV TID and vein mapping  Assessment & Plan:   Principal Problem:   Anasarca/  Acute on chronic kidney failure [facility]) - Nephrology assisting with diuresis and further planning. Vein mapping is being planned.      Diastolic CHF ([place]) - Pt continues to have multiple admission and I would like to consult cardiology for assistance with optimizing medications in order to avoid or prolong periods of times of hospitalization. Very grateful for cardiology's assistance.   Active Problems:   Convulsions/seizures ([place]) - pt is on prn ativan    Diabetes ([place]) - Pt on SSI - diabetic diet.    Essential hypertension - pt on amlodipine, coreg, imdur, stable on this regimen  Hyperlipidemia - Pt is on statin  DVT prophylaxis: Heparin Code Status: Full Family Communication: none at bedside Disposition Plan: pending improvement in condition   Consultants:   Nephrology  Cardiology   Procedures: none   Antimicrobials: none   Subjective: Pt has no new complaints currently  Objective: Vitals:   08/08/17 0509 08/08/17 0733 08/08/17 0811 08/08/17 1137  BP: (!) 137/45  (!) 143/57 (!) 107/41  Pulse: 60  61 (!) 54  Resp: 18   18  Temp: 97.6 F (36.4 C)   98.2 F (36.8 C)  TempSrc: Oral   Oral  SpO2: 92% 93%  94%  Weight: 69.9 kg (154 lb)     Height:        Intake/Output Summary (Last 24 hours) at 08/08/2017 1158 Last data filed at 08/08/2017 0930 Gross per 24 hour  Intake 963 ml  Output 1050 ml  Net -87 ml   Filed Weights   08/06/17 1854 08/07/17 0429 08/08/17 0509  Weight: 72 kg  (158 lb 12.8 oz) 70.8 kg (156 lb 1.6 oz) 69.9 kg (154 lb)    Examination:  General exam: Appears calm and comfortable, in nad.  Respiratory system: Clear to auscultation. Respiratory effort normal. Equal chest rise.  Cardiovascular system: S1 & S2 heard, RRR.  no murmurs  Gastrointestinal system: Abdomen is nondistended, soft and nontender. No organomegaly or masses felt. Normal bowel sounds heard. Central nervous system: Alert and oriented. No focal neurological deficits. Extremities: warm and dry Skin: No rashes, lesions or ulcers, on limited exam. Psychiatry:  Mood & affect appropriate.   Data Reviewed: I have personally reviewed following labs and imaging studies  CBC: Recent Labs  Lab 08/06/17 1006 08/07/17 0620 08/08/17 0850  WBC 3.7* 2.3* 2.2*  HGB 8.5* 7.8* 8.2*  HCT 27.2* 25.1* 26.2*  MCV 91.3 89.3 87.9  PLT 111* 112* 144*   Basic Metabolic Panel: Recent Labs  Lab 08/06/17 1006 08/06/17 1604 08/07/17 0620 08/08/17 0520  NA 135  --  138 141  K 4.3  --  3.7 3.2*  CL 102  --  103 102  CO2 22  --  25 24  GLUCOSE 122*  --  165* 176*  BUN 53*  --  54* 48*  CREATININE 3.75*  --  3.45* 2.90*  CALCIUM 8.8*  --  8.5* 9.0  MG  --  2.6*  --   --   PHOS  --  4.3  --  4.1   GFR: Estimated Creatinine Clearance: 15.3 mL/min (A) (by C-G formula based on SCr of 2.9 mg/dL (H)). Liver Function Tests: Recent Labs  Lab 08/08/17 0520  ALBUMIN 3.0*   No results for input(s): LIPASE, AMYLASE in the last 168 hours. No results for input(s): AMMONIA in the last 168 hours. Coagulation Profile: No results for input(s): INR, PROTIME in the last 168 hours. Cardiac Enzymes: Recent Labs  Lab 08/06/17 1604  TROPONINI <0.03   BNP (last 3 results) No results for input(s): PROBNP in the last 8760 hours. HbA1C: No results for input(s): HGBA1C in the last 72 hours. CBG: Recent Labs  Lab 08/07/17 0806 08/07/17 1238 08/07/17 1724 08/07/17 2120 08/08/17 0718  GLUCAP 137*  196* 153* 154* 158*   Lipid Profile: No results for input(s): CHOL, HDL, LDLCALC, TRIG, CHOLHDL, LDLDIRECT in the last 72 hours. Thyroid Function Tests: Recent Labs    08/08/17 0520  FREET4 0.59*   Anemia Panel: Recent Labs    08/07/17 1017  TIBC 231*  IRON 30   Sepsis Labs: No results for input(s): PROCALCITON, LATICACIDVEN in the last 168 hours.  No results found for this or any previous visit (from the past 240 hour(s)).   Radiology Studies: Dg Abd 1 View  Result Date: 08/06/2017 CLINICAL DATA:  Umbilical pain. EXAM: ABDOMEN - 1 VIEW COMPARISON:  None. FINDINGS: Bowel gas pattern is nonobstructive. No mass or mass effect. No free peritoneal air. Few pelvic phleboliths are present. There is mild degenerate change of the spine and hips. IMPRESSION: Nonobstructive bowel gas pattern. Electronically Signed   By: Marin Olp M.D.   On: 08/06/2017 16:57   US Renal  Result Date: 08/06/2017 CLINICAL DATA:  Acute renal insufficiency. EXAM: RENAL / URINARY TRACT ULTRASOUND COMPLETE COMPARISON:  CT 06/17/2017 FINDINGS: Right Kidney: Length: 11.6 cm. Echogenicity within normal limits. Mild prominence of the central intrarenal collecting system and renal pelvis. No focal mass. Left Kidney: Length: 8.4 cm. Echogenicity within normal limits. No mass or hydronephrosis visualized. Bladder: Appears normal for degree of bladder distention. Ureteral jets not visualized. Ascites. IMPRESSION: Mild size discrepancy with slightly small left kidney. Mild dilatation of the central right intrarenal collecting system/renal pelvis. Electronically Signed   By: Marin Olp M.D.   On: 08/06/2017 16:56    Scheduled Meds: . amLODipine  5 mg Oral BID  . aspirin EC  81 mg Oral Daily  . atorvastatin  80 mg Oral q1800  . calcitRIOL  0.5 mcg Oral Q M,W,F  . carvedilol  25 mg Oral BID WC  . darbepoetin (ARANESP) injection - NON-DIALYSIS  40 mcg Subcutaneous Q Tue-1800  . feeding supplement (ENSURE ENLIVE)  237  mL Oral TID BM  . ferrous sulfate  325 mg Oral BID WC  . furosemide  80 mg Intravenous TID  . heparin  5,000 Units Subcutaneous Q8H  . hydrALAZINE  75 mg Oral TID  . insulin aspart  0-9 Units Subcutaneous TID WC  . isosorbide mononitrate  30 mg Oral Daily  . lactulose  10 g Oral BID  . omega-3 acid ethyl esters  1 g Oral BID  . oxyCODONE-acetaminophen  1 tablet Oral Once  . QUEtiapine  25 mg Oral BID  . sertraline  150 mg Oral Daily  . sodium chloride flush  3 mL Intravenous Q12H  . traZODone  100 mg Oral QHS   Continuous Infusions: . ferric  gluconate (FERRLECIT/NULECIT) IV       LOS: 2 days    Time spent: > 35 minutes   Velvet Bathe, MD [facility] Pager [phone]  If 7PM-7AM, please contact night-coverage [email or web address] Password [email or web address] 08/08/2017, 11:58 AM

## 2017-08-08 NOTE — Progress Notes (Signed)
[facility] ROUNDING NOTE   Subjective:   Interval History:72 y.o.with history of CKD stage IV, diastolic CHF, HTN, cirrhosis, DM, depression, and pulmonary hypertension presents for followup of CHF.   Baseline creatinine appears to be about 2.5 - 3   She clearly has stage 4 chronic renal disease  She does not have much lower extremity edema and appears that her RHC  1/18 demonstrated fairly severe pulmonary hypertension   She diuresed well last night with IV lasix 80mg  TID  Vein mapping ordered although not a great candidate for long term dialysis    Iron studies would appear to indicate iron deficiency  I think it very reasonable to start iron and ESA      Objective:  Vital signs in last 24 hours:  Temp:  [97.6 F (36.4 C)-99.5 F (37.5 C)] 97.6 F (36.4 C) (03/26 0509) Pulse Rate:  [60-64] 61 (03/26 0811) Resp:  [18] 18 (03/26 0509) BP: (130-143)/(45-67) 143/57 (03/26 0811) SpO2:  [92 %-96 %] 93 % (03/26 0733) Weight:  [154 lb (69.9 kg)] 154 lb (69.9 kg) (03/26 0509)  Weight change: -15 lb 6 oz (-6.974 kg) Filed Weights   08/06/17 1854 08/07/17 0429 08/08/17 0509  Weight: 158 lb 12.8 oz (72 kg) 156 lb 1.6 oz (70.8 kg) 154 lb (69.9 kg)    Intake/Output: I/O last 3 completed shifts: In: 1266 [P.O.:1260; I.V.:6] Out: 2450 [Urine:2450]   Intake/Output this shift:  No intake/output data recorded.  CVS- RRR  JVP increased  RS- CTA ABD- BS present soft non-distended EXT- no edema   Basic Metabolic Panel: Recent Labs  Lab 08/06/17 1006 08/06/17 1604 08/07/17 0620 08/08/17 0520  NA 135  --  138 141  K 4.3  --  3.7 3.2*  CL 102  --  103 102  CO2 22  --  25 24  GLUCOSE 122*  --  165* 176*  BUN 53*  --  54* 48*  CREATININE 3.75*  --  3.45* 2.90*  CALCIUM 8.8*  --  8.5* 9.0  MG  --  2.6*  --   --   PHOS  --  4.3  --  4.1    Liver Function Tests: Recent Labs  Lab 08/08/17 0520  ALBUMIN 3.0*   No results for input(s): LIPASE, AMYLASE in  the last 168 hours. No results for input(s): AMMONIA in the last 168 hours.  CBC: Recent Labs  Lab 08/06/17 1006 08/07/17 0620 08/08/17 0850  WBC 3.7* 2.3* 2.2*  HGB 8.5* 7.8* 8.2*  HCT 27.2* 25.1* 26.2*  MCV 91.3 89.3 87.9  PLT 111* 112* 112*    Cardiac Enzymes: Recent Labs  Lab 08/06/17 1604  TROPONINI <0.03    BNP: Invalid input(s): POCBNP  CBG: Recent Labs  Lab 08/07/17 0806 08/07/17 1238 08/07/17 1724 08/07/17 2120 08/08/17 0718  GLUCAP 137* 196* 153* 154* 158*    Microbiology: Results for orders placed or performed during the hospital encounter of 06/10/17  Culture, blood (routine x 2)     Status: None   Collection Time: 06/13/17 12:05 PM  Result Value Ref Range Status   Specimen Description BLOOD LEFT HAND  Final   Special Requests IN PEDIATRIC BOTTLE Blood Culture adequate volume  Final   Culture   Final    NO GROWTH 5 DAYS Performed at [facility], [place] [address] [place], [place] [address]    Report Status 06/18/2017 FINAL  Final  Culture, blood (routine x 2)  Status: None   Collection Time: 06/13/17 12:10 PM  Result Value Ref Range Status   Specimen Description BLOOD ARM LEFT  Final   Special Requests IN PEDIATRIC BOTTLE Blood Culture adequate volume  Final   Culture   Final    NO GROWTH 5 DAYS Performed at [facility], [address], [place], [place] [address]    Report Status 06/18/2017 FINAL  Final  Respiratory Panel by PCR     Status: Abnormal   Collection Time: 06/15/17 10:59 AM  Result Value Ref Range Status   Adenovirus NOT DETECTED NOT DETECTED Final   Coronavirus 229E NOT DETECTED NOT DETECTED Final   Coronavirus HKU1 NOT DETECTED NOT DETECTED Final   Coronavirus NL63 NOT DETECTED NOT DETECTED Final   Coronavirus OC43 NOT DETECTED NOT DETECTED Final   Metapneumovirus NOT DETECTED NOT DETECTED Final   Rhinovirus / Enterovirus NOT DETECTED NOT DETECTED Final   Influenza A NOT DETECTED NOT DETECTED Final    Influenza B NOT DETECTED NOT DETECTED Final   Parainfluenza Virus 1 NOT DETECTED NOT DETECTED Final   Parainfluenza Virus 2 NOT DETECTED NOT DETECTED Final   Parainfluenza Virus 3 NOT DETECTED NOT DETECTED Final   Parainfluenza Virus 4 NOT DETECTED NOT DETECTED Final   Respiratory Syncytial Virus DETECTED (A) NOT DETECTED Final    Comment: CRITICAL RESULT CALLED TO, READ BACK BY AND VERIFIED WITH: Clarene Essex RN 15:45 06/15/17 ([email or web address])    Bordetella pertussis NOT DETECTED NOT DETECTED Final   Chlamydophila pneumoniae NOT DETECTED NOT DETECTED Final   Mycoplasma pneumoniae NOT DETECTED NOT DETECTED Final    Coagulation Studies: No results for input(s): LABPROT, INR in the last 72 hours.  Urinalysis: Recent Labs    08/06/17 1512  COLORURINE YELLOW  LABSPEC 1.008  PHURINE 5.0  GLUCOSEU NEGATIVE  HGBUR NEGATIVE  BILIRUBINUR NEGATIVE  KETONESUR NEGATIVE  PROTEINUR 100*  NITRITE NEGATIVE  LEUKOCYTESUR NEGATIVE      Imaging: Dg Chest 2 View  Result Date: 08/06/2017 CLINICAL DATA:  Lower extremity edema for 5 days, fluid buildup, shortness of breath overnight, history CHF, diabetes mellitus, hypertension EXAM: CHEST - 2 VIEW COMPARISON:  06/19/2017 FINDINGS: Enlargement of cardiac silhouette with pulmonary vascular congestion. Mediastinal contours normal. Bibasilar small pleural effusions. No acute infiltrate or pneumothorax. Bones demineralized. IMPRESSION: Enlargement of cardiac silhouette with pulmonary vascular congestion. Small bibasilar pleural effusions. Electronically Signed   By: Lavonia Dana M.D.   On: 08/06/2017 11:42   Dg Abd 1 View  Result Date: 08/06/2017 CLINICAL DATA:  Umbilical pain. EXAM: ABDOMEN - 1 VIEW COMPARISON:  None. FINDINGS: Bowel gas pattern is nonobstructive. No mass or mass effect. No free peritoneal air. Few pelvic phleboliths are present. There is mild degenerate change of the spine and hips. IMPRESSION: Nonobstructive bowel gas pattern. Electronically  Signed   By: Marin Olp M.D.   On: 08/06/2017 16:57   US Renal  Result Date: 08/06/2017 CLINICAL DATA:  Acute renal insufficiency. EXAM: RENAL / URINARY TRACT ULTRASOUND COMPLETE COMPARISON:  CT 06/17/2017 FINDINGS: Right Kidney: Length: 11.6 cm. Echogenicity within normal limits. Mild prominence of the central intrarenal collecting system and renal pelvis. No focal mass. Left Kidney: Length: 8.4 cm. Echogenicity within normal limits. No mass or hydronephrosis visualized. Bladder: Appears normal for degree of bladder distention. Ureteral jets not visualized. Ascites. IMPRESSION: Mild size discrepancy with slightly small left kidney. Mild dilatation of the central right intrarenal collecting system/renal pelvis. Electronically Signed   By: Marin Olp M.D.  On: 08/06/2017 16:56     Medications:    . amLODipine  5 mg Oral BID  . aspirin EC  81 mg Oral Daily  . atorvastatin  80 mg Oral q1800  . calcitRIOL  0.5 mcg Oral Q M,W,F  . carvedilol  25 mg Oral BID WC  . feeding supplement (ENSURE ENLIVE)  237 mL Oral TID BM  . ferrous sulfate  325 mg Oral BID WC  . furosemide  80 mg Intravenous TID  . heparin  5,000 Units Subcutaneous Q8H  . hydrALAZINE  75 mg Oral TID  . insulin aspart  0-9 Units Subcutaneous TID WC  . isosorbide mononitrate  30 mg Oral Daily  . lactulose  10 g Oral BID  . omega-3 acid ethyl esters  1 g Oral BID  . oxyCODONE-acetaminophen  1 tablet Oral Once  . QUEtiapine  25 mg Oral BID  . sertraline  150 mg Oral Daily  . sodium chloride flush  3 mL Intravenous Q12H  . traZODone  100 mg Oral QHS   acetaminophen **OR** acetaminophen, docusate sodium, gabapentin, ipratropium-albuterol, LORazepam, ondansetron **OR** ondansetron (ZOFRAN) IV, sodium chloride  Assessment/ Plan:   Acute on chronic renal disease stage 4 most likely from diuresis for increased dyspnea  Renal asymmetry that suggests renal vascular pathology  Agree that there is diastolic dysfunction and that  control of blood pressure and diet is critical.   Acute decompensation of heart failure will need evaluation of potential etiologies   Chronic renal disease stage 4   Vein mapping   Bones  PTH 111  Ca 9 Phos 4.1  Anemia  Low iron stores start iron and esa  HTN      continue diuresis   Creatinine actually improved with volume removal       LOS: 2 Stacy Smith W @TODAY @10 :49 AM

## 2017-08-08 NOTE — Progress Notes (Signed)
RN notified pharmacy to send aranesp injection.

## 2017-08-08 NOTE — Progress Notes (Signed)
Patient ID: Stacy Smith, female   DOB: 1945/10/09, 72 y.o.   MRN: [id number]     Advanced Heart Failure Rounding Note  PCP-Cardiologist: Carlyle Dolly, MD  HF Cardiology: Aundra Dubin   Subjective:    She says that she is breathing better this morning.  Weight is down again today though I/Os not particularly negative.  Creatinine down to 2.9.   Echo: EF 55-60%, moderate diastolic dysfunction, moderately dilated RV with normal systolic function, PASP 50 mmHg.    Objective:   Weight Range: 154 lb (69.9 kg) Body mass index is 30.08 kg/m.   Vital Signs:   Temp:  [97.6 F (36.4 C)-99.5 F (37.5 C)] 97.6 F (36.4 C) (03/26 0509) Pulse Rate:  [60-64] 61 (03/26 0811) Resp:  [18] 18 (03/26 0509) BP: (130-143)/(45-67) 143/57 (03/26 0811) SpO2:  [92 %-96 %] 93 % (03/26 0733) Weight:  [154 lb (69.9 kg)] 154 lb (69.9 kg) (03/26 0509) Last BM Date: 08/07/17  Weight change: Filed Weights   08/06/17 1854 08/07/17 0429 08/08/17 0509  Weight: 158 lb 12.8 oz (72 kg) 156 lb 1.6 oz (70.8 kg) 154 lb (69.9 kg)    Intake/Output:   Intake/Output Summary (Last 24 hours) at 08/08/2017 0841 Last data filed at 08/08/2017 0514 Gross per 24 hour  Intake 963 ml  Output 1750 ml  Net -787 ml      Physical Exam    General: NAD Neck: JVP 10-11 cm, no thyromegaly or thyroid nodule.  Lungs: Clear to auscultation bilaterally with normal respiratory effort. CV: Nondisplaced PMI.  Heart regular S1/S2, no S3/S4, 2/6 HSM LLSB.  No peripheral edema.   Abdomen: Soft, nontender, no hepatosplenomegaly, mild distention.  Skin: Intact without lesions or rashes.  Neurologic: Alert and oriented x 3.  Psych: Normal affect. Extremities: No clubbing or cyanosis.  HEENT: Normal.   Telemetry   NSR (personally reviewed)  Labs    CBC Recent Labs    08/06/17 1006 08/07/17 0620  WBC 3.7* 2.3*  HGB 8.5* 7.8*  HCT 27.2* 25.1*  MCV 91.3 89.3  PLT 111* 295*   Basic Metabolic Panel Recent Labs   08/06/17 1604 08/07/17 0620 08/08/17 0520  NA  --  138 141  K  --  3.7 3.2*  CL  --  103 102  CO2  --  25 24  GLUCOSE  --  165* 176*  BUN  --  54* 48*  CREATININE  --  3.45* 2.90*  CALCIUM  --  8.5* 9.0  MG 2.6*  --   --   PHOS 4.3  --  4.1   Liver Function Tests Recent Labs    08/08/17 0520  ALBUMIN 3.0*   No results for input(s): LIPASE, AMYLASE in the last 72 hours. Cardiac Enzymes Recent Labs    08/06/17 1604  TROPONINI <0.03    BNP: BNP (last 3 results) Recent Labs    06/11/17 0632 06/26/17 1424 08/06/17 1428  BNP 470.6* 792.7* 953.7*    ProBNP (last 3 results) No results for input(s): PROBNP in the last 8760 hours.   D-Dimer No results for input(s): DDIMER in the last 72 hours. Hemoglobin A1C No results for input(s): HGBA1C in the last 72 hours. Fasting Lipid Panel No results for input(s): CHOL, HDL, LDLCALC, TRIG, CHOLHDL, LDLDIRECT in the last 72 hours. Thyroid Function Tests No results for input(s): TSH, T4TOTAL, T3FREE, THYROIDAB in the last 72 hours.  Invalid input(s): FREET3  Other results:   Imaging     No results  found.   Medications:     Scheduled Medications: . amLODipine  5 mg Oral BID  . aspirin EC  81 mg Oral Daily  . atorvastatin  80 mg Oral q1800  . calcitRIOL  0.5 mcg Oral Q M,W,F  . carvedilol  25 mg Oral BID WC  . feeding supplement (ENSURE ENLIVE)  237 mL Oral TID BM  . ferrous sulfate  325 mg Oral BID WC  . furosemide  80 mg Intravenous TID  . heparin  5,000 Units Subcutaneous Q8H  . hydrALAZINE  75 mg Oral TID  . insulin aspart  0-9 Units Subcutaneous TID WC  . isosorbide mononitrate  30 mg Oral Daily  . lactulose  10 g Oral BID  . omega-3 acid ethyl esters  1 g Oral BID  . oxyCODONE-acetaminophen  1 tablet Oral Once  . QUEtiapine  25 mg Oral BID  . sertraline  150 mg Oral Daily  . sodium chloride flush  3 mL Intravenous Q12H  . traZODone  100 mg Oral QHS     Infusions:   PRN  Medications:  acetaminophen **OR** acetaminophen, docusate sodium, gabapentin, ipratropium-albuterol, LORazepam, ondansetron **OR** ondansetron (ZOFRAN) IV, sodium chloride    Patient Profile   Stacy Smith is a 72 y.o. female with CKD stage IV, Diastolic CHF, HTN, Cirrhosis, DM, Depression, and Pulmonary HTN.   Admitted 08/06/17 with volume overload and AKI.    Assessment/Plan   1. Acute on chronic diastolic CHF: Echo 5/17 with EF 55-60%, moderately dilated RV/normal systolic function, PASP 50 mmHg.  [place] 1/19 with pulmonary venous hypertension.  Diastolic CHF is worsened by CKD stage IV which promotes volume retention and makes diuresis difficult.  On current Lasix regimen, weight is down though I am not sure that I/Os are fully recorded.  She is still volume overloaded on exam.  - Would continue Lasix 80 mg IV every 8 hrs today. Replace K.  - Would not place Cardiomems given degree of renal dysfunction.  2. AKI on CKD stage IV: Renal US with small left kidney, ?renovascular disease, though suspect major contributor to CKD is HTN and DM.  Creatinine down to 2.9 today with diuresis.  Continue to follow creatinine closely.  - Renal following, may eventually need HD for volume management.  3. Pulmonary hypertension: This was pulmonary venous hypertension (group 2) by last RHC.  Treatment will be effective diuresis.  4. HTN: SBP 130s-140, adequate for now.  Continue current meds.  5. Cirrhosis: ?NAFLD.  RV failure also likely contributes.  Workup for other causes negative.  6. Depression: Continue Zoloft.  7. Anemia: No overt bleeding.  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   Length of Stay: 2  Loralie Champagne, MD  08/08/2017, 8:41 AM  Advanced Heart Failure Team Pager [phone] (M-F; 7a - 4p)  Please contact [facility] for night-coverage after hours (4p -7a ) and weekends on [email or web address]

## 2017-08-09 LAB — CBC WITH DIFFERENTIAL/PLATELET
BASOS ABS: 0 10*3/uL (ref 0.0–0.1)
Basophils Relative: 0 %
EOS ABS: 0.1 10*3/uL (ref 0.0–0.7)
EOS PCT: 2 %
HCT: 25 % — ABNORMAL LOW (ref 36.0–46.0)
Hemoglobin: 8.1 g/dL — ABNORMAL LOW (ref 12.0–15.0)
LYMPHS PCT: 33 %
Lymphs Abs: 0.7 10*3/uL (ref 0.7–4.0)
MCH: 29.2 pg (ref 26.0–34.0)
MCHC: 32.4 g/dL (ref 30.0–36.0)
MCV: 90.3 fL (ref 78.0–100.0)
Monocytes Absolute: 0.1 10*3/uL (ref 0.1–1.0)
Monocytes Relative: 6 %
NEUTROS PCT: 59 %
Neutro Abs: 1.2 10*3/uL — ABNORMAL LOW (ref 1.7–7.7)
PLATELETS: 116 10*3/uL — AB (ref 150–400)
RBC: 2.77 MIL/uL — AB (ref 3.87–5.11)
RDW: 18.1 % — ABNORMAL HIGH (ref 11.5–15.5)
WBC: 2 10*3/uL — AB (ref 4.0–10.5)

## 2017-08-09 LAB — GLUCOSE, CAPILLARY
GLUCOSE-CAPILLARY: 259 mg/dL — AB (ref 65–99)
Glucose-Capillary: 167 mg/dL — ABNORMAL HIGH (ref 65–99)
Glucose-Capillary: 246 mg/dL — ABNORMAL HIGH (ref 65–99)
Glucose-Capillary: 235 mg/dL — ABNORMAL HIGH (ref 65–99)

## 2017-08-09 LAB — RENAL FUNCTION PANEL
Albumin: 2.9 g/dL — ABNORMAL LOW (ref 3.5–5.0)
Anion gap: 12 (ref 5–15)
BUN: 44 mg/dL — AB (ref 6–20)
CO2: 27 mmol/L (ref 22–32)
CREATININE: 2.55 mg/dL — AB (ref 0.44–1.00)
Calcium: 9.2 mg/dL (ref 8.9–10.3)
Chloride: 102 mmol/L (ref 101–111)
GFR, EST AFRICAN AMERICAN: 21 mL/min — AB (ref >60)
GFR, EST NON AFRICAN AMERICAN: 18 mL/min — AB (ref >60)
Glucose, Bld: 204 mg/dL — ABNORMAL HIGH (ref 65–99)
PHOSPHORUS: 4 mg/dL (ref 2.5–4.6)
POTASSIUM: 3.8 mmol/L (ref 3.5–5.1)
Sodium: 141 mmol/L (ref 135–145)

## 2017-08-09 LAB — PROTEIN ELECTROPHORESIS, SERUM
A/G RATIO SPE: 1.1 (ref 0.7–1.7)
ALBUMIN ELP: 3.1 g/dL (ref 2.9–4.4)
ALPHA-2-GLOBULIN: 0.7 g/dL (ref 0.4–1.0)
Alpha-1-Globulin: 0.2 g/dL (ref 0.0–0.4)
Beta Globulin: 0.8 g/dL (ref 0.7–1.3)
Gamma Globulin: 0.9 g/dL (ref 0.4–1.8)
Globulin, Total: 2.7 g/dL (ref 2.2–3.9)
Total Protein ELP: 5.8 g/dL — ABNORMAL LOW (ref 6.0–8.5)

## 2017-08-09 LAB — T3, FREE: T3 FREE: 1.5 pg/mL — AB (ref 2.0–4.4)

## 2017-08-09 LAB — IMMUNOFIXATION, URINE

## 2017-08-09 NOTE — Progress Notes (Signed)
[facility] Progress Note  Patient: Stacy Smith SEG:[id number]   PCP: Jacqualine Code, DO DOB: Mar 29, 1946   DOA: 08/06/2017   DOS: 08/09/2017   Date of Service: the patient was seen and examined on 08/09/2017  Subjective: Feeling better, breathing is better.  Still mild shortness of breath.  No chest pain no abdominal pain no nausea no vomiting no diarrhea.  Brief hospital course: Pt. with PMH of chronic diastolic CHF, CKD stage IV, type II DM, SLE, RA, seizure; admitted on 08/06/2017, presented with complaint of swelling and shortness of breath, was found to have acute on chronic diastolic CHF. Currently further plan is continue IV diuresis.  Assessment and Plan: 1.  Acute on chronic diastolic CHF. Acute on chronic hypoxic respiratory failure. Ejection fraction shows 55-60% EF, dilated RV. Started on IV Lasix, currently on 80 mg 3 times daily IV. Appreciate CHF team management. Recommend continue IV diuresis for at least 1 or 2 days and monitor renal function.  2.  Acute kidney injury on chronic kidney disease stage IV. Renal ultrasound with small left kidney. Appreciate nephrology input. Vein mapping performed. No indication at present for inpatient HD, patient will probably get outpatient vascular access placement. Monitor for now.  3.  Anemia of chronic kidney disease. Iron deficiency. Getting IV iron replacement as well as ESA.  4.  Cirrhosis of the liver. ?  NAF LD. No decompensation for now, monitor for now.  5.  Essential hypertension. Blood pressure well controlled. Continue current management.  6.  Type 2 diabetes mellitus. On sliding scale insulin.  Diet: Renal diet DVT Prophylaxis: subcutaneous Heparin  Advance goals of care discussion: full code  Family Communication: family was present at bedside, at the time of interview. The pt provided permission to discuss medical plan with the family. Opportunity was given to ask question and all questions  were answered satisfactorily.   Disposition:  Discharge to home.  Consultants: cardiology, nephrology Procedures: US renal, vein mapping,   Antibiotics: Anti-infectives (From admission, onward)   None       Objective: Physical Exam: Vitals:   08/09/17 0527 08/09/17 0936 08/09/17 1211 08/09/17 1300  BP: (!) 133/44 (!) 152/64 (!) 146/43 136/64  Pulse: (!) 57 60 62 68  Resp: 18 15 18 15   Temp: 98 F (36.7 C) 98 F (36.7 C) 98.2 F (36.8 C) 98.4 F (36.9 C)  TempSrc: Oral Oral Oral Oral  SpO2: 94% 100% 95% 98%  Weight: 68.8 kg (151 lb 11.2 oz)     Height:        Intake/Output Summary (Last 24 hours) at 08/09/2017 1814 Last data filed at 08/09/2017 1330 Gross per 24 hour  Intake 483 ml  Output 2700 ml  Net -2217 ml   Filed Weights   08/07/17 0429 08/08/17 0509 08/09/17 0527  Weight: 70.8 kg (156 lb 1.6 oz) 69.9 kg (154 lb) 68.8 kg (151 lb 11.2 oz)   General: Alert, Awake and Oriented to Time, Place and Person. Appear in mild distress, affect appropriate Eyes: PERRL, Conjunctiva normal ENT: Oral Mucosa clear moist. Neck: no JVD, no Abnormal Mass Or lumps Cardiovascular: S1 and S2 Present, aortic systolic  Murmur, Peripheral Pulses Present Respiratory: normal respiratory effort, Bilateral Air entry equal and Decreased, no use of accessory muscle, bilateral Crackles, no wheezes Abdomen: Bowel Sound present, Soft and no tenderness, no hernia Skin: no redness, no Rash, no induration Extremities: trace Pedal edema, no calf tenderness Neurologic: Grossly no focal neuro deficit. Bilaterally Equal motor  strength  Data Reviewed: CBC: Recent Labs  Lab 08/06/17 1006 08/07/17 0620 08/08/17 0850 08/09/17 0532  WBC 3.7* 2.3* 2.2* 2.0*  NEUTROABS  --   --   --  1.2*  HGB 8.5* 7.8* 8.2* 8.1*  HCT 27.2* 25.1* 26.2* 25.0*  MCV 91.3 89.3 87.9 90.3  PLT 111* 112* 112* 580*   Basic Metabolic Panel: Recent Labs  Lab 08/06/17 1006 08/06/17 1604 08/07/17 0620 08/08/17 0520  08/09/17 0532  NA 135  --  138 141 141  K 4.3  --  3.7 3.2* 3.8  CL 102  --  103 102 102  CO2 22  --  25 24 27   GLUCOSE 122*  --  165* 176* 204*  BUN 53*  --  54* 48* 44*  CREATININE 3.75*  --  3.45* 2.90* 2.55*  CALCIUM 8.8*  --  8.5* 9.0 9.2  MG  --  2.6*  --   --   --   PHOS  --  4.3  --  4.1 4.0    Liver Function Tests: Recent Labs  Lab 08/08/17 0520 08/09/17 0532  ALBUMIN 3.0* 2.9*   No results for input(s): LIPASE, AMYLASE in the last 168 hours. No results for input(s): AMMONIA in the last 168 hours. Coagulation Profile: No results for input(s): INR, PROTIME in the last 168 hours. Cardiac Enzymes: Recent Labs  Lab 08/06/17 1604  TROPONINI <0.03   BNP (last 3 results) No results for input(s): PROBNP in the last 8760 hours. CBG: Recent Labs  Lab 08/08/17 1706 08/08/17 2135 08/09/17 0756 08/09/17 1209 08/09/17 1710  GLUCAP 147* 202* 167* 246* 235*   Studies: No results found.  Scheduled Meds: . amLODipine  5 mg Oral BID  . aspirin EC  81 mg Oral Daily  . atorvastatin  80 mg Oral q1800  . calcitRIOL  0.5 mcg Oral Q M,W,F  . carvedilol  25 mg Oral BID WC  . darbepoetin (ARANESP) injection - NON-DIALYSIS  40 mcg Subcutaneous Q Tue-1800  . feeding supplement (ENSURE ENLIVE)  237 mL Oral TID BM  . ferrous sulfate  325 mg Oral BID WC  . furosemide  80 mg Intravenous TID  . heparin  5,000 Units Subcutaneous Q8H  . hydrALAZINE  75 mg Oral TID  . insulin aspart  0-9 Units Subcutaneous TID WC  . isosorbide mononitrate  30 mg Oral Daily  . lactulose  10 g Oral BID  . omega-3 acid ethyl esters  1 g Oral BID  . QUEtiapine  25 mg Oral BID  . sertraline  150 mg Oral Daily  . sodium chloride flush  3 mL Intravenous Q12H  . traZODone  100 mg Oral QHS   Continuous Infusions: . ferric gluconate (FERRLECIT/NULECIT) IV Stopped (08/09/17 1359)   PRN Meds: acetaminophen **OR** acetaminophen, docusate sodium, gabapentin, ipratropium-albuterol, LORazepam, ondansetron  **OR** ondansetron (ZOFRAN) IV, sodium chloride  Time spent: 35 minutes  Author: Berle Mull, MD [facility] Pager: [phone] 08/09/2017 6:14 PM  If 7PM-7AM, please contact night-coverage at [email or web address], password [facility]

## 2017-08-09 NOTE — Progress Notes (Signed)
[facility] Progress Note   Interval History:72 y.o.with history of CKD stage IV, diastolic CHF, HTN, cirrhosis, DM, depression, and pulmonary hypertension presents for followup of CHF.   Baseline creatinine appears to be about 2.5 - 3 She clearly has stage 4 chronic renal disease She does not have much lower extremity edema and appears that her [place] 1/18 demonstrated fairly severe pulmonary hypertension   She again diuresed well last night with IV lasix 80mg  TID  I&O 473- 2900 = -2427  Assessment/ Plan:    Acute on chronic renal disease stage 4 and now appears to be cardiorenal as the renal function has been improving with good diuresis during this hospitalization.  - continue IV diuresis -> may convert to PO in the next 24-48hrs  Acute decompensation of heart failure will need evaluation of potential etiologies   Chronic renal disease stage 4  Vein mapping but does not need an access placed for now  Bones  PTH 111  Ca 9 Phos 4.1  Anemia  Low iron stores start iron and esa  HTN      continue diuresis   Creatinine actually improved with volume removal      Subjective:   She is feeling much better; dyspnea present but much improved. She denies any n/v/f/cp.   Objective:   BP (!) 146/43 (BP Location: Left Arm)   Pulse 62   Temp 98.2 F (36.8 C) (Oral)   Resp 18   Ht 5' (1.524 m)   Wt 68.8 kg (151 lb 11.2 oz) Comment: scale a  SpO2 95%   BMI 29.63 kg/m   Intake/Output Summary (Last 24 hours) at 08/09/2017 1213 Last data filed at 08/09/2017 0855 Gross per 24 hour  Intake 353 ml  Output 3000 ml  Net -2647 ml   Weight change: -1.043 kg (-2 lb 4.8 oz)  Physical Exam: CVS- RRR  JVP increased  RS- CTA ABD- BS present soft non-distended EXT- no edema below the knees but still has edema in thighs   Imaging: No results found.  Labs: BMET Recent Labs  Lab 08/06/17 1006 08/06/17 1604 08/07/17 0620 08/08/17 0520 08/09/17 0532  NA 135  --   138 141 141  K 4.3  --  3.7 3.2* 3.8  CL 102  --  103 102 102  CO2 22  --  25 24 27   GLUCOSE 122*  --  165* 176* 204*  BUN 53*  --  54* 48* 44*  CREATININE 3.75*  --  3.45* 2.90* 2.55*  CALCIUM 8.8*  --  8.5* 9.0 9.2  PHOS  --  4.3  --  4.1 4.0   CBC Recent Labs  Lab 08/06/17 1006 08/07/17 0620 08/08/17 0850 08/09/17 0532  WBC 3.7* 2.3* 2.2* 2.0*  NEUTROABS  --   --   --  1.2*  HGB 8.5* 7.8* 8.2* 8.1*  HCT 27.2* 25.1* 26.2* 25.0*  MCV 91.3 89.3 87.9 90.3  PLT 111* 112* 112* 116*    Medications:    . amLODipine  5 mg Oral BID  . aspirin EC  81 mg Oral Daily  . atorvastatin  80 mg Oral q1800  . calcitRIOL  0.5 mcg Oral Q M,W,F  . carvedilol  25 mg Oral BID WC  . darbepoetin (ARANESP) injection - NON-DIALYSIS  40 mcg Subcutaneous Q Tue-1800  . feeding supplement (ENSURE ENLIVE)  237 mL Oral TID BM  . ferrous sulfate  325 mg Oral BID WC  . furosemide  80 mg  Intravenous TID  . heparin  5,000 Units Subcutaneous Q8H  . hydrALAZINE  75 mg Oral TID  . insulin aspart  0-9 Units Subcutaneous TID WC  . isosorbide mononitrate  30 mg Oral Daily  . lactulose  10 g Oral BID  . omega-3 acid ethyl esters  1 g Oral BID  . oxyCODONE-acetaminophen  1 tablet Oral Once  . QUEtiapine  25 mg Oral BID  . sertraline  150 mg Oral Daily  . sodium chloride flush  3 mL Intravenous Q12H  . traZODone  100 mg Oral QHS      Otelia Santee, MD 08/09/2017, 12:13 PM

## 2017-08-09 NOTE — Progress Notes (Addendum)
Patient ID: Stacy Smith, female   DOB: 1945/09/10, 72 y.o.   MRN: [id number]     Advanced Heart Failure Rounding Note  PCP-Cardiologist: Carlyle Dolly, MD  HF Cardiology: Aundra Dubin   Subjective:    Feeling better today. Very happy with increasing urine output.  Remains very swollen in thighs and flanks.  Creatinine continues to trend down. Negative 2.4 L. Weight down 3 lbs.   Echo: 08/07/17 EF 55-60%, moderate diastolic dysfunction, moderately dilated RV with normal systolic function, PASP 50 mmHg.   Objective:   Weight Range: 151 lb 11.2 oz (68.8 kg) Body mass index is 29.63 kg/m.   Vital Signs:   Temp:  [98 F (36.7 C)-98.6 F (37 C)] 98 F (36.7 C) (03/27 0936) Pulse Rate:  [54-61] 60 (03/27 0936) Resp:  [15-18] 15 (03/27 0936) BP: (107-152)/(35-64) 152/64 (03/27 0936) SpO2:  [94 %-100 %] 100 % (03/27 0936) Weight:  [151 lb 11.2 oz (68.8 kg)] 151 lb 11.2 oz (68.8 kg) (03/27 0527) Last BM Date: 08/07/17  Weight change: Filed Weights   08/07/17 0429 08/08/17 0509 08/09/17 0527  Weight: 156 lb 1.6 oz (70.8 kg) 154 lb (69.9 kg) 151 lb 11.2 oz (68.8 kg)    Intake/Output:   Intake/Output Summary (Last 24 hours) at 08/09/2017 1042 Last data filed at 08/09/2017 0855 Gross per 24 hour  Intake 353 ml  Output 3000 ml  Net -2647 ml    Physical Exam    General: Well appearing. No resp difficulty. HEENT: Normal Neck: Supple. JVP 9-10 cm. Carotids 2+ bilat; no bruits. No thyromegaly or nodule noted. Cor: PMI nondisplaced. RRR, 2/6 HSM LLSB. 2-3+ dependent thigh edema.  Lungs: CTAB, normal effort. Abdomen: Soft, non-tender, non-distended, no HSM. No bruits or masses. +BS  Extremities: No cyanosis, clubbing, or rash.  Neuro: Alert & orientedx3, cranial nerves grossly intact. moves all 4 extremities w/o difficulty. Affect pleasant   Telemetry   NSR, personally reviewed.  Labs    CBC Recent Labs    08/08/17 0850 08/09/17 0532  WBC 2.2* 2.0*  NEUTROABS  --  1.2*   HGB 8.2* 8.1*  HCT 26.2* 25.0*  MCV 87.9 90.3  PLT 112* 213*   Basic Metabolic Panel Recent Labs    08/06/17 1604  08/08/17 0520 08/09/17 0532  NA  --    < > 141 141  K  --    < > 3.2* 3.8  CL  --    < > 102 102  CO2  --    < > 24 27  GLUCOSE  --    < > 176* 204*  BUN  --    < > 48* 44*  CREATININE  --    < > 2.90* 2.55*  CALCIUM  --    < > 9.0 9.2  MG 2.6*  --   --   --   PHOS 4.3  --  4.1 4.0   < > = values in this interval not displayed.   Liver Function Tests Recent Labs    08/08/17 0520 08/09/17 0532  ALBUMIN 3.0* 2.9*   No results for input(s): LIPASE, AMYLASE in the last 72 hours. Cardiac Enzymes Recent Labs    08/06/17 1604  TROPONINI <0.03    BNP: BNP (last 3 results) Recent Labs    06/11/17 0632 06/26/17 1424 08/06/17 1428  BNP 470.6* 792.7* 953.7*    ProBNP (last 3 results) No results for input(s): PROBNP in the last 8760 hours.   D-Dimer No results  for input(s): DDIMER in the last 72 hours. Hemoglobin A1C No results for input(s): HGBA1C in the last 72 hours. Fasting Lipid Panel No results for input(s): CHOL, HDL, LDLCALC, TRIG, CHOLHDL, LDLDIRECT in the last 72 hours. Thyroid Function Tests Recent Labs    08/08/17 0520  T3FREE 1.5*    Other results:   Imaging    No results found.   Medications:     Scheduled Medications: . amLODipine  5 mg Oral BID  . aspirin EC  81 mg Oral Daily  . atorvastatin  80 mg Oral q1800  . calcitRIOL  0.5 mcg Oral Q M,W,F  . carvedilol  25 mg Oral BID WC  . darbepoetin (ARANESP) injection - NON-DIALYSIS  40 mcg Subcutaneous Q Tue-1800  . feeding supplement (ENSURE ENLIVE)  237 mL Oral TID BM  . ferrous sulfate  325 mg Oral BID WC  . furosemide  80 mg Intravenous TID  . heparin  5,000 Units Subcutaneous Q8H  . hydrALAZINE  75 mg Oral TID  . insulin aspart  0-9 Units Subcutaneous TID WC  . isosorbide mononitrate  30 mg Oral Daily  . lactulose  10 g Oral BID  . omega-3 acid ethyl esters   1 g Oral BID  . oxyCODONE-acetaminophen  1 tablet Oral Once  . QUEtiapine  25 mg Oral BID  . sertraline  150 mg Oral Daily  . sodium chloride flush  3 mL Intravenous Q12H  . traZODone  100 mg Oral QHS    Infusions: . ferric gluconate (FERRLECIT/NULECIT) IV Stopped (08/08/17 1310)    PRN Medications: acetaminophen **OR** acetaminophen, docusate sodium, gabapentin, ipratropium-albuterol, LORazepam, ondansetron **OR** ondansetron (ZOFRAN) IV, sodium chloride    Patient Profile   Stacy Smith is a 72 y.o. female with CKD stage IV, Diastolic CHF, HTN, Cirrhosis, DM, Depression, and Pulmonary HTN.   Admitted 08/06/17 with volume overload and AKI.   Assessment/Plan   1. Acute on chronic diastolic CHF: Echo 1/01 with EF 55-60%, moderately dilated RV/normal systolic function, PASP 50 mmHg.  [place] 1/19 with pulmonary venous hypertension.  Diastolic CHF is worsened by CKD stage IV which promotes volume retention and makes diuresis difficult.   - Weight trending down on current regimen. K 3.8 from 3.2 yesterday. - She remains volume overloaded on exam.   - Would continue Lasix 80 mg IV every 8 hrs today.  - Would not place Cardiomems given degree of renal dysfunction.  2. AKI on CKD stage IV: Renal US with small left kidney, ?renovascular disease, though suspect major contributor to CKD is HTN and DM.   - Creatinine trending down with high dose lasix.  - Renal following, may eventually need HD for volume management.  3. Pulmonary hypertension: This was pulmonary venous hypertension (group 2) by last RHC.  - Treatment will be effective diuresis.  4. HTN: SBP 130s-140, adequate for now.   - Continue current meds.  5. Cirrhosis: ?NAFLD.  RV failure also likely contributes. - Workup for other causes negative. No change.  6. Depression:  - Continue Zoloft. No change. 7. Anemia:  - No overt bleeding. 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   Length of Stay:  3  Annamaria Helling  08/09/2017, 10:42 AM  Advanced Heart Failure Team Pager [phone] (M-F; 7a - 4p)  Please contact [facility] for night-coverage after hours (4p -7a ) and weekends on [email or web address]  Patient seen with PA, agree with the above note.  Creatinine trending down,  2.55 today.  Breathing improving.  On exam, still with JVD but peripheral edema decreased.   Continue Lasix 80 mg IV every 8 hrs today.  Will need 1-2 days more IV diuresis.  Follow creatinine closely.   Needs to get out of bed, will have PT/cardiac rehab come by.   Loralie Champagne 08/09/2017 1:50 PM

## 2017-08-10 LAB — CBC WITH DIFFERENTIAL/PLATELET
Basophils Absolute: 0 10*3/uL (ref 0.0–0.1)
Basophils Relative: 0 %
EOS ABS: 0.1 10*3/uL (ref 0.0–0.7)
Eosinophils Relative: 4 %
HCT: 26.8 % — ABNORMAL LOW (ref 36.0–46.0)
HEMOGLOBIN: 8.3 g/dL — AB (ref 12.0–15.0)
LYMPHS ABS: 0.6 10*3/uL — AB (ref 0.7–4.0)
Lymphocytes Relative: 28 %
MCH: 27.9 pg (ref 26.0–34.0)
MCHC: 31 g/dL (ref 30.0–36.0)
MCV: 90.2 fL (ref 78.0–100.0)
MONO ABS: 0.2 10*3/uL (ref 0.1–1.0)
Monocytes Relative: 8 %
NEUTROS ABS: 1.2 10*3/uL — AB (ref 1.7–7.7)
Neutrophils Relative %: 60 %
Platelets: 114 10*3/uL — ABNORMAL LOW (ref 150–400)
RBC: 2.97 MIL/uL — ABNORMAL LOW (ref 3.87–5.11)
RDW: 17.7 % — AB (ref 11.5–15.5)
WBC: 2.1 10*3/uL — ABNORMAL LOW (ref 4.0–10.5)

## 2017-08-10 LAB — GLUCOSE, CAPILLARY
GLUCOSE-CAPILLARY: 157 mg/dL — AB (ref 65–99)
GLUCOSE-CAPILLARY: 210 mg/dL — AB (ref 65–99)
Glucose-Capillary: 262 mg/dL — ABNORMAL HIGH (ref 65–99)
Glucose-Capillary: 132 mg/dL — ABNORMAL HIGH (ref 65–99)

## 2017-08-10 LAB — RENAL FUNCTION PANEL
ALBUMIN: 3 g/dL — AB (ref 3.5–5.0)
ANION GAP: 13 (ref 5–15)
BUN: 38 mg/dL — AB (ref 6–20)
CALCIUM: 9.1 mg/dL (ref 8.9–10.3)
CO2: 24 mmol/L (ref 22–32)
CREATININE: 2.29 mg/dL — AB (ref 0.44–1.00)
Chloride: 103 mmol/L (ref 101–111)
GFR calc Af Amer: 23 mL/min — ABNORMAL LOW (ref >60)
GFR calc non Af Amer: 20 mL/min — ABNORMAL LOW (ref >60)
GLUCOSE: 165 mg/dL — AB (ref 65–99)
PHOSPHORUS: 4.1 mg/dL (ref 2.5–4.6)
Potassium: 3.4 mmol/L — ABNORMAL LOW (ref 3.5–5.1)
SODIUM: 140 mmol/L (ref 135–145)

## 2017-08-10 MED ORDER — NITROGLYCERIN 0.4 MG SL SUBL
0.4000 mg | SUBLINGUAL_TABLET | SUBLINGUAL | Status: DC | PRN
  Administered 2017-08-10 (×3): 0.4 mg via SUBLINGUAL
  Filled 2017-08-10: qty 1

## 2017-08-10 MED ORDER — TORSEMIDE 20 MG PO TABS
60.0000 mg | ORAL_TABLET | Freq: Every day | ORAL | Status: DC
  Administered 2017-08-10 – 2017-08-11 (×2): 60 mg via ORAL
  Filled 2017-08-10 (×2): qty 3

## 2017-08-10 MED ORDER — POTASSIUM CHLORIDE CRYS ER 20 MEQ PO TBCR
40.0000 meq | EXTENDED_RELEASE_TABLET | Freq: Once | ORAL | Status: AC
  Administered 2017-08-10: 40 meq via ORAL
  Filled 2017-08-10: qty 2

## 2017-08-10 MED ORDER — LORAZEPAM 2 MG/ML IJ SOLN: 1.0000 mg | Freq: Once | INTRAMUSCULAR | Status: DC

## 2017-08-10 NOTE — Progress Notes (Signed)
Patient was complaining of 5/10 chest pain and SOB. Patient stated that she felt pressure on her chest. Patient was placed on 2L [place] of O2 and an EKG was performed. MD was notified and placed orders for Nitro. Patient was given 3 Nitro's and reported 0/10 pain level. Will continue to monitor.   Drue Flirt, RN

## 2017-08-10 NOTE — Progress Notes (Signed)
Pt educated about safety and importance of bed alarm during the night however pt refuses to be on bed alarm. Will continue to round on patient.   Keatyn Jawad, RN    

## 2017-08-10 NOTE — Progress Notes (Signed)
1430-1450 Pt did not want to walk again as she is resting now. Gave her CHF booklet and reviewed the zones. Discussed importance of daily weights, 2000 mg sodium restriction and 2LFR. Discussed when to call MD with signs/symptoms. Gave low sodium handouts. Encouraged walking as tolerated for ex. Pt voiced understanding. Graylon Good RN BSN 08/10/2017 2:49 PM

## 2017-08-10 NOTE — Progress Notes (Signed)
Patient ID: Stacy RORKE, female   DOB: 04/03/1946, 72 y.o.   MRN: [id number]     Advanced Heart Failure Rounding Note  PCP-Cardiologist: Carlyle Dolly, MD  HF Cardiology: Aundra Dubin   Subjective:    No dyspnea this morning, able to walk in halls.  Weight down 3 lbs. Creatinine down to 2.29.   Echo: 08/07/17 EF 55-60%, moderate diastolic dysfunction, moderately dilated RV with normal systolic function, PASP 50 mmHg.   Objective:   Weight Range: 148 lb 3.2 oz (67.2 kg) Body mass index is 28.94 kg/m.   Vital Signs:   Temp:  [97.6 F (36.4 C)-98.4 F (36.9 C)] 98.4 F (36.9 C) (03/28 0512) Pulse Rate:  [56-68] 57 (03/28 0512) Resp:  [15-18] 18 (03/28 0512) BP: (115-152)/(35-64) 140/45 (03/28 0512) SpO2:  [95 %-100 %] 96 % (03/28 0512) Weight:  [148 lb 3.2 oz (67.2 kg)] 148 lb 3.2 oz (67.2 kg) (03/28 0512) Last BM Date: 08/09/17  Weight change: Filed Weights   08/08/17 0509 08/09/17 0527 08/10/17 0512  Weight: 154 lb (69.9 kg) 151 lb 11.2 oz (68.8 kg) 148 lb 3.2 oz (67.2 kg)    Intake/Output:   Intake/Output Summary (Last 24 hours) at 08/10/2017 0852 Last data filed at 08/10/2017 0845 Gross per 24 hour  Intake 720 ml  Output 2300 ml  Net -1580 ml    Physical Exam    General: NAD Neck: No JVD, no thyromegaly or thyroid nodule.  Lungs: Clear to auscultation bilaterally with normal respiratory effort. CV: Nondisplaced PMI.  Heart regular S1/S2, no S3/S4, no murmur.  No peripheral edema.   Abdomen: Soft, nontender, no hepatosplenomegaly, no distention.  Skin: Intact without lesions or rashes.  Neurologic: Alert and oriented x 3.  Psych: Normal affect. Extremities: No clubbing or cyanosis.  HEENT: Normal.   Telemetry   NSR, personally reviewed.  Labs    CBC Recent Labs    08/09/17 0532 08/10/17 0606  WBC 2.0* 2.1*  NEUTROABS 1.2* 1.2*  HGB 8.1* 8.3*  HCT 25.0* 26.8*  MCV 90.3 90.2  PLT 116* 470*   Basic Metabolic Panel Recent Labs    08/09/17 0532  08/10/17 0606  NA 141 140  K 3.8 3.4*  CL 102 103  CO2 27 24  GLUCOSE 204* 165*  BUN 44* 38*  CREATININE 2.55* 2.29*  CALCIUM 9.2 9.1  PHOS 4.0 4.1   Liver Function Tests Recent Labs    08/09/17 0532 08/10/17 0606  ALBUMIN 2.9* 3.0*   No results for input(s): LIPASE, AMYLASE in the last 72 hours. Cardiac Enzymes No results for input(s): CKTOTAL, CKMB, CKMBINDEX, TROPONINI in the last 72 hours.  BNP: BNP (last 3 results) Recent Labs    06/11/17 0632 06/26/17 1424 08/06/17 1428  BNP 470.6* 792.7* 953.7*    ProBNP (last 3 results) No results for input(s): PROBNP in the last 8760 hours.   D-Dimer No results for input(s): DDIMER in the last 72 hours. Hemoglobin A1C No results for input(s): HGBA1C in the last 72 hours. Fasting Lipid Panel No results for input(s): CHOL, HDL, LDLCALC, TRIG, CHOLHDL, LDLDIRECT in the last 72 hours. Thyroid Function Tests Recent Labs    08/08/17 0520  T3FREE 1.5*    Other results:   Imaging    No results found.   Medications:     Scheduled Medications: . amLODipine  5 mg Oral BID  . aspirin EC  81 mg Oral Daily  . atorvastatin  80 mg Oral q1800  . calcitRIOL  0.5  mcg Oral Q M,W,F  . carvedilol  25 mg Oral BID WC  . darbepoetin (ARANESP) injection - NON-DIALYSIS  40 mcg Subcutaneous Q Tue-1800  . feeding supplement (ENSURE ENLIVE)  237 mL Oral TID BM  . ferrous sulfate  325 mg Oral BID WC  . furosemide  80 mg Intravenous TID  . heparin  5,000 Units Subcutaneous Q8H  . hydrALAZINE  75 mg Oral TID  . insulin aspart  0-9 Units Subcutaneous TID WC  . isosorbide mononitrate  30 mg Oral Daily  . lactulose  10 g Oral BID  . LORazepam  1 mg Intravenous Once  . omega-3 acid ethyl esters  1 g Oral BID  . QUEtiapine  25 mg Oral BID  . sertraline  150 mg Oral Daily  . sodium chloride flush  3 mL Intravenous Q12H  . traZODone  100 mg Oral QHS    Infusions: . ferric gluconate (FERRLECIT/NULECIT) IV Stopped (08/09/17 1359)      PRN Medications: acetaminophen **OR** acetaminophen, docusate sodium, gabapentin, ipratropium-albuterol, LORazepam, nitroGLYCERIN, ondansetron **OR** ondansetron (ZOFRAN) IV, sodium chloride    Patient Profile   Stacy Smith is a 72 y.o. female with CKD stage IV, Diastolic CHF, HTN, Cirrhosis, DM, Depression, and Pulmonary HTN.   Admitted 08/06/17 with volume overload and AKI.   Assessment/Plan   1. Acute on chronic diastolic CHF: Echo 1/61 with EF 55-60%, moderately dilated RV/normal systolic function, PASP 50 mmHg.  [place] 1/19 with pulmonary venous hypertension.  Diastolic CHF is worsened by CKD stage IV which promotes volume retention and makes diuresis difficult.  Volume status much improved, looks near-euvolemic.  - Stop IV Lasix, start torsemide 60 mg daily.   - Cardiomems is a consideration, if renal function remains stable this may be helpful for volume management.  2. AKI on CKD stage IV: Renal US with small left kidney, ?renovascular disease, though suspect major contributor to CKD is HTN and DM.  Creatinine trending down with diuresis, now at 2.29 which is likely her baseline.   3. Pulmonary hypertension: This was pulmonary venous hypertension (group 2) by last RHC.  - Treatment will be effective diuresis, no role for selective pulmonary vasodilators.  4. HTN: BP reasonably controlled.   5. Cirrhosis: ?NAFLD.  RV failure also likely contributes. Workup for other causes negative. No change.  6. Depression: Continue Zoloft. No change. 7. Pancytopenia: ?Related to cirrhosis.  Numbers are stable on CBC today.    Length of Stay: 4  Loralie Champagne, MD  08/10/2017, 8:52 AM  Advanced Heart Failure Team Pager [phone] (M-F; 7a - 4p)  Please contact [facility] for night-coverage after hours (4p -7a ) and weekends on [email or web address]

## 2017-08-10 NOTE — Evaluation (Signed)
Physical Therapy Evaluation & Discharge Patient Details Name: Stacy Smith MRN: [id number] DOB: Sep 15, 1945 Today's Date: 08/10/2017   History of Present Illness  Pt is a 72 y.o. female admitted 08/06/17 with c/o swelling and SOB; found to have acute on chronic CHF. Pt also with AKI on CKD IV; vein mapping performed; no indication for inpatient HD. Of note, 3/28 with worsening chest pain relieved with 3x nitro. PMH includes anemia, HTN, DMII, liver cirrhosis, peripheral neuropathy, lupus, RA, seizures.    Clinical Impression  Patient evaluated by Physical Therapy with no further acute PT needs identified. PTA, pt indep with mobility with intermittent use of rollator; son and daughter-in-law provide 24/7 supervision and assist with IADLs. Today, pt able to amb mod indep with RW; requires supervision amb with no DME. All education has been completed and the patient has no further questions. PT is signing off. Thank you for this referral.     Follow Up Recommendations No PT follow up;Supervision for mobility/OOB    Equipment Recommendations  None recommended by PT    Recommendations for Other Services       Precautions / Restrictions Precautions Precautions: Fall Restrictions Weight Bearing Restrictions: No      Mobility  Bed Mobility Overal bed mobility: Independent                Transfers Overall transfer level: Independent Equipment used: None;Rolling walker (2 wheeled)                Ambulation/Gait Ambulation/Gait assistance: Modified independent (Device/Increase time);Supervision Ambulation Distance (Feet): 250 Feet Assistive device: Rolling walker (2 wheeled);None Gait Pattern/deviations: Step-through pattern;Decreased stride length Gait velocity: Decreased Gait velocity interpretation: <1.8 ft/sec, indicative of risk for recurrent falls General Gait Details: Mod indep with RW. Supervision with no DME  Stairs            Wheelchair Mobility     Modified Rankin (Stroke Patients Only)       Balance Overall balance assessment: Needs assistance   Sitting balance-Leahy Scale: Good       Standing balance-Leahy Scale: Good                               Pertinent Vitals/Pain Pain Assessment: No/denies pain    Home Living Family/patient expects to be discharged to:: Private residence Living Arrangements: Children Available Help at Discharge: Family;Available 24 hours/day Type of Home: House Home Access: Ramped entrance     Home Layout: Laundry or work area in basement;Two level;Able to live on main level with bedroom/bathroom Home Equipment: Gilford Rile - 2 wheels;Bedside commode;Shower seat;Wheelchair - manual;Grab bars - tub/shower;Hand held Tourist information centre manager - 4 wheels Additional Comments: Pt does not go in basement. Husband passed away. Lives with son and daughter-in-law who provide 24/7 supervision    Prior Function Level of Independence: Independent with assistive device(s)         Comments: Intermittent use of rollator or RW as needed. Does not drive. Son/daughter-in-law run errands and assist with IADLs     Hand Dominance        Extremity/Trunk Assessment   Upper Extremity Assessment Upper Extremity Assessment: Overall WFL for tasks assessed    Lower Extremity Assessment Lower Extremity Assessment: Overall WFL for tasks assessed       Communication   Communication: No difficulties  Cognition Arousal/Alertness: Awake/alert Behavior During Therapy: WFL for tasks assessed/performed Overall Cognitive Status: Within Functional Limits for tasks assessed  General Comments      Exercises     Assessment/Plan    PT Assessment Patent does not need any further PT services  PT Problem List         PT Treatment Interventions      PT Goals (Current goals can be found in the Care Plan section)  Acute Rehab PT Goals PT Goal  Formulation: All assessment and education complete, DC therapy    Frequency     Barriers to discharge        Co-evaluation               AM-PAC PT "6 Clicks" Daily Activity  Outcome Measure Difficulty turning over in bed (including adjusting bedclothes, sheets and blankets)?: None Difficulty moving from lying on back to sitting on the side of the bed? : None Difficulty sitting down on and standing up from a chair with arms (e.g., wheelchair, bedside commode, etc,.)?: None Help needed moving to and from a bed to chair (including a wheelchair)?: None Help needed walking in hospital room?: None Help needed climbing 3-5 steps with a railing? : A Little 6 Click Score: 23    End of Session Equipment Utilized During Treatment: Gait belt Activity Tolerance: Patient tolerated treatment well Patient left: in chair;with call bell/phone within reach;with nursing/sitter in room Nurse Communication: Mobility status PT Visit Diagnosis: Other abnormalities of gait and mobility (R26.89)    Time: 5597-4163 PT Time Calculation (min) (ACUTE ONLY): 17 min   Charges:   PT Evaluation $PT Eval Moderate Complexity: 1 Mod     PT G Codes:       [name], PT, DPT Acute Rehab Services  Pager: [place] 08/10/2017, 8:57 AM

## 2017-08-10 NOTE — Progress Notes (Signed)
npatient Diabetes Program Recommendations  AACE/ADA: New Consensus Statement on Inpatient Glycemic Control (2015)  Target Ranges:  Prepandial:   less than 140 mg/dL      Peak postprandial:   less than 180 mg/dL (1-2 hours)      Critically ill patients:  140 - 180 mg/dL   Results for JADENCE, KINLAW (MRN [id number]) as of 08/10/2017 08:56  Ref. Range 08/09/2017 07:56 08/09/2017 12:09 08/09/2017 17:10 08/09/2017 22:03 08/10/2017 07:36  Glucose-Capillary Latest Ref Range: 65 - 99 mg/dL 167 (H) 246 (H) 235 (H) 259 (H) 157 (H)   Review of Glycemic Control  Diabetes history: DM 2 Outpatient Diabetes medications: Levemir 40 units qam, 1-15 units qpm, Novolog tid SSI Current orders for Inpatient glycemic control: Novolog Sensitive Correction 0-9 units tid  Inpatient Diabetes Program Recommendations:    Glucose trend increases into the 200's throughout the day. Consider ordering a portion of home Levemir 8 units Daily.  Thanks,  Tama Headings RN, MSN, BC-ADM, [facility] Inpatient Diabetes Coordinator Team Pager [phone] (8a-5p)

## 2017-08-10 NOTE — Progress Notes (Signed)
[facility] Progress Note   Interval History:72 y.o.with history of CKD stage IV, diastolic CHF, HTN, cirrhosis, DM, depression, and pulmonary hypertension presents for followup of CHF.   Baseline creatinine appears to be about 2.5 - 3 She clearly has stage 4 chronic renal disease She does not have much lower extremity edema and appears that her [place] 1/18 demonstrated fairly severe pulmonary hypertension  She again diuresed well last night with IV lasix 80mg  TID  -2427 net yesterday  Over past 24hrs I&O [phone] = -1700  Assessment/ Plan:    Acute on chronic renal disease stage 4 and now appears to be cardiorenal as the renal function has been improving with good diuresis during this hospitalization.  - continue IV diuresis -> may convert to PO   - Stable for d/c from renal standpoint; she looks great.  - Will need f/u with [facility] (Dr. Justin Mend or 1st available).   Acute decompensation of heart failure will need evaluation of potential etiologies   Chronic renal disease stage 4Vein mappingbut does not need an access placed for now  BonesPTH 111 Ca 9 Phos 4.1  AnemiaLow iron stores start iron and esa  HTN continue diuresis Creatinine actually improved with volume removal     Subjective:   She is feeling much better; dyspnea present but much improved. She denies any n/v/f/cp.   Objective:   BP (!) 145/42 (BP Location: Right Arm)   Pulse (!) 58   Temp 98.4 F (36.9 C) (Oral)   Resp 18   Ht 5' (1.524 m)   Wt 67.2 kg (148 lb 3.2 oz) Comment: scale a  SpO2 98%   BMI 28.94 kg/m   Intake/Output Summary (Last 24 hours) at 08/10/2017 0949 Last data filed at 08/10/2017 4431 Gross per 24 hour  Intake 720 ml  Output 2400 ml  Net -1680 ml   Weight change: -1.588 kg (-3 lb 8 oz)  Physical Exam: GEN - NAD A&Ox3 CVS- RRRJVP increased RS- CTA ABD- BS present soft non-distended EXT- no edema below the knees but still has edema in  thighs    Imaging: No results found.  Labs: BMET Recent Labs  Lab 08/06/17 1006 08/06/17 1604 08/07/17 0620 08/08/17 0520 08/09/17 0532 08/10/17 0606  NA 135  --  138 141 141 140  K 4.3  --  3.7 3.2* 3.8 3.4*  CL 102  --  103 102 102 103  CO2 22  --  25 24 27 24   GLUCOSE 122*  --  165* 176* 204* 165*  BUN 53*  --  54* 48* 44* 38*  CREATININE 3.75*  --  3.45* 2.90* 2.55* 2.29*  CALCIUM 8.8*  --  8.5* 9.0 9.2 9.1  PHOS  --  4.3  --  4.1 4.0 4.1   CBC Recent Labs  Lab 08/07/17 0620 08/08/17 0850 08/09/17 0532 08/10/17 0606  WBC 2.3* 2.2* 2.0* 2.1*  NEUTROABS  --   --  1.2* 1.2*  HGB 7.8* 8.2* 8.1* 8.3*  HCT 25.1* 26.2* 25.0* 26.8*  MCV 89.3 87.9 90.3 90.2  PLT 112* 112* 116* 114*    Medications:    . amLODipine  5 mg Oral BID  . aspirin EC  81 mg Oral Daily  . atorvastatin  80 mg Oral q1800  . calcitRIOL  0.5 mcg Oral Q M,W,F  . carvedilol  25 mg Oral BID WC  . darbepoetin (ARANESP) injection - NON-DIALYSIS  40 mcg Subcutaneous Q Tue-1800  . feeding supplement (ENSURE ENLIVE)  237 mL Oral TID BM  . ferrous sulfate  325 mg Oral BID WC  . heparin  5,000 Units Subcutaneous Q8H  . hydrALAZINE  75 mg Oral TID  . insulin aspart  0-9 Units Subcutaneous TID WC  . isosorbide mononitrate  30 mg Oral Daily  . lactulose  10 g Oral BID  . LORazepam  1 mg Intravenous Once  . omega-3 acid ethyl esters  1 g Oral BID  . QUEtiapine  25 mg Oral BID  . sertraline  150 mg Oral Daily  . sodium chloride flush  3 mL Intravenous Q12H  . torsemide  60 mg Oral Daily  . traZODone  100 mg Oral QHS      Otelia Santee, MD 08/10/2017, 9:49 AM

## 2017-08-10 NOTE — Progress Notes (Signed)
[facility] Progress Note  Patient: Stacy Smith VCB:[id number]   PCP: Jacqualine Code, DO DOB: [birth year]   DOA: 08/06/2017   DOS: 08/10/2017   Date of Service: the patient was seen and examined on 08/10/2017  Subjective: Feeling better, breathing is better.  Although last night she did have some chest pain which felt like pressure and resolved with nitroglycerin.  No nausea no vomiting.  No recurrence of the chest pain.  Brief hospital course: Pt. with PMH of chronic diastolic CHF, CKD stage IV, type II DM, SLE, RA, seizure; admitted on 08/06/2017, presented with complaint of swelling and shortness of breath, was found to have acute on chronic diastolic CHF. Currently further plan is continue diuresis.  Assessment and Plan: 1.  Acute on chronic diastolic CHF. Acute on chronic hypoxic respiratory failure. Ejection fraction shows 55-60% EF, dilated RV. Started on IV Lasix, currently on 80 mg 3 times daily IV. Appreciate CHF team management. Recommend transitioning IV diuresis to oral and monitor.  2.  Acute kidney injury on chronic kidney disease stage IV. Renal ultrasound with small left kidney. Appreciate nephrology input. Vein mapping performed. No indication at present for inpatient HD, patient will probably get outpatient vascular access placement. Stable from nephrology perspective to be discharged home.  3.  Anemia of chronic kidney disease. Iron deficiency. Getting iron replacement as well as ESA.  4.  Cirrhosis of the liver. ?  NAF LD. No decompensation for now, monitor for now.  5.  Essential hypertension. Blood pressure well controlled. Continue current management.  6.  Type 2 diabetes mellitus. On sliding scale insulin.  7.  Chest pain. EKG unremarkable, no real chest pain from last night. Positive for a few minutes only. Conservatively manage for now.  Diet: Renal diet DVT Prophylaxis: subcutaneous Heparin  Advance goals of care discussion: full  code  Family Communication: no family was present at bedside, at the time of interview.   Disposition:  Discharge to home tomorrow pending cardiology clearance.  Consultants: cardiology, nephrology Procedures: US renal, vein mapping,   Antibiotics: Anti-infectives (From admission, onward)   None       Objective: Physical Exam: Vitals:   08/10/17 0227 08/10/17 0512 08/10/17 0905 08/10/17 1125  BP: (!) 115/35 (!) 140/45 (!) 145/42 (!) 129/46  Pulse:  (!) 57 (!) 58 (!) 57  Resp:  18  18  Temp:  98.4 F (36.9 C)  99.1 F (37.3 C)  TempSrc:  Oral  Oral  SpO2:  96% 98% 96%  Weight:  67.2 kg (148 lb 3.2 oz)    Height:        Intake/Output Summary (Last 24 hours) at 08/10/2017 1543 Last data filed at 08/10/2017 1505 Gross per 24 hour  Intake 840 ml  Output 2200 ml  Net -1360 ml   Filed Weights   08/08/17 0509 08/09/17 0527 08/10/17 0512  Weight: 69.9 kg (154 lb) 68.8 kg (151 lb 11.2 oz) 67.2 kg (148 lb 3.2 oz)   General: Alert, Awake and Oriented to Time, Place and Person. Appear in mild distress, affect appropriate Eyes: PERRL, Conjunctiva normal ENT: Oral Mucosa clear moist. Neck: no JVD, no Abnormal Mass Or lumps Cardiovascular: S1 and S2 Present, aortic systolic  Murmur, Peripheral Pulses Present Respiratory: normal respiratory effort, Bilateral Air entry equal and Decreased, no use of accessory muscle, bilateral Crackles, no wheezes Abdomen: Bowel Sound present, Soft and no tenderness, no hernia Skin: no redness, no Rash, no induration Extremities: trace Pedal edema, no calf  tenderness Neurologic: Grossly no focal neuro deficit. Bilaterally Equal motor strength  Data Reviewed: CBC: Recent Labs  Lab 08/06/17 1006 08/07/17 0620 08/08/17 0850 08/09/17 0532 08/10/17 0606  WBC 3.7* 2.3* 2.2* 2.0* 2.1*  NEUTROABS  --   --   --  1.2* 1.2*  HGB 8.5* 7.8* 8.2* 8.1* 8.3*  HCT 27.2* 25.1* 26.2* 25.0* 26.8*  MCV 91.3 89.3 87.9 90.3 90.2  PLT 111* 112* 112* 116* 114*    Basic Metabolic Panel: Recent Labs  Lab 08/06/17 1006 08/06/17 1604 08/07/17 0620 08/08/17 0520 08/09/17 0532 08/10/17 0606  NA 135  --  138 141 141 140  K 4.3  --  3.7 3.2* 3.8 3.4*  CL 102  --  103 102 102 103  CO2 22  --  25 24 27 24   GLUCOSE 122*  --  165* 176* 204* 165*  BUN 53*  --  54* 48* 44* 38*  CREATININE 3.75*  --  3.45* 2.90* 2.55* 2.29*  CALCIUM 8.8*  --  8.5* 9.0 9.2 9.1  MG  --  2.6*  --   --   --   --   PHOS  --  4.3  --  4.1 4.0 4.1    Liver Function Tests: Recent Labs  Lab 08/08/17 0520 08/09/17 0532 08/10/17 0606  ALBUMIN 3.0* 2.9* 3.0*   No results for input(s): LIPASE, AMYLASE in the last 168 hours. No results for input(s): AMMONIA in the last 168 hours. Coagulation Profile: No results for input(s): INR, PROTIME in the last 168 hours. Cardiac Enzymes: Recent Labs  Lab 08/06/17 1604  TROPONINI <0.03   BNP (last 3 results) No results for input(s): PROBNP in the last 8760 hours. CBG: Recent Labs  Lab 08/09/17 1209 08/09/17 1710 08/09/17 2203 08/10/17 0736 08/10/17 1123  GLUCAP 246* 235* 259* 157* 262*   Studies: No results found.  Scheduled Meds: . amLODipine  5 mg Oral BID  . aspirin EC  81 mg Oral Daily  . atorvastatin  80 mg Oral q1800  . calcitRIOL  0.5 mcg Oral Q M,W,F  . carvedilol  25 mg Oral BID WC  . darbepoetin (ARANESP) injection - NON-DIALYSIS  40 mcg Subcutaneous Q Tue-1800  . feeding supplement (ENSURE ENLIVE)  237 mL Oral TID BM  . ferrous sulfate  325 mg Oral BID WC  . heparin  5,000 Units Subcutaneous Q8H  . hydrALAZINE  75 mg Oral TID  . insulin aspart  0-9 Units Subcutaneous TID WC  . isosorbide mononitrate  30 mg Oral Daily  . lactulose  10 g Oral BID  . LORazepam  1 mg Intravenous Once  . omega-3 acid ethyl esters  1 g Oral BID  . QUEtiapine  25 mg Oral BID  . sertraline  150 mg Oral Daily  . sodium chloride flush  3 mL Intravenous Q12H  . torsemide  60 mg Oral Daily  . traZODone  100 mg Oral QHS    Continuous Infusions: . ferric gluconate (FERRLECIT/NULECIT) IV Stopped (08/09/17 1359)   PRN Meds: acetaminophen **OR** acetaminophen, docusate sodium, gabapentin, ipratropium-albuterol, LORazepam, nitroGLYCERIN, ondansetron **OR** ondansetron (ZOFRAN) IV, sodium chloride  Time spent: 35 minutes  Author: Berle Mull, MD [facility] Pager: [phone] 08/10/2017 3:43 PM  If 7PM-7AM, please contact night-coverage at [email or web address], password [facility]

## 2017-08-11 LAB — CBC WITH DIFFERENTIAL/PLATELET
BASOS PCT: 0 %
Basophils Absolute: 0 10*3/uL (ref 0.0–0.1)
Eosinophils Absolute: 0.1 10*3/uL (ref 0.0–0.7)
Eosinophils Relative: 4 %
HEMATOCRIT: 28.2 % — AB (ref 36.0–46.0)
Hemoglobin: 8.8 g/dL — ABNORMAL LOW (ref 12.0–15.0)
Lymphocytes Relative: 24 %
Lymphs Abs: 0.6 10*3/uL — ABNORMAL LOW (ref 0.7–4.0)
MCH: 28.2 pg (ref 26.0–34.0)
MCHC: 31.2 g/dL (ref 30.0–36.0)
MCV: 90.4 fL (ref 78.0–100.0)
MONO ABS: 0.2 10*3/uL (ref 0.1–1.0)
MONOS PCT: 7 %
NEUTROS ABS: 1.7 10*3/uL (ref 1.7–7.7)
Neutrophils Relative %: 65 %
Platelets: 120 10*3/uL — ABNORMAL LOW (ref 150–400)
RBC: 3.12 MIL/uL — ABNORMAL LOW (ref 3.87–5.11)
RDW: 18.1 % — AB (ref 11.5–15.5)
WBC: 2.6 10*3/uL — ABNORMAL LOW (ref 4.0–10.5)

## 2017-08-11 LAB — RENAL FUNCTION PANEL
Albumin: 3.2 g/dL — ABNORMAL LOW (ref 3.5–5.0)
Anion gap: 11 (ref 5–15)
BUN: 36 mg/dL — ABNORMAL HIGH (ref 6–20)
CALCIUM: 9.3 mg/dL (ref 8.9–10.3)
CO2: 28 mmol/L (ref 22–32)
CREATININE: 2.29 mg/dL — AB (ref 0.44–1.00)
Chloride: 100 mmol/L — ABNORMAL LOW (ref 101–111)
GFR calc Af Amer: 23 mL/min — ABNORMAL LOW (ref >60)
GFR calc non Af Amer: 20 mL/min — ABNORMAL LOW (ref >60)
GLUCOSE: 174 mg/dL — AB (ref 65–99)
Phosphorus: 3.6 mg/dL (ref 2.5–4.6)
Potassium: 3.8 mmol/L (ref 3.5–5.1)
SODIUM: 139 mmol/L (ref 135–145)

## 2017-08-11 LAB — GLUCOSE, CAPILLARY
Glucose-Capillary: 174 mg/dL — ABNORMAL HIGH (ref 65–99)
Glucose-Capillary: 254 mg/dL — ABNORMAL HIGH (ref 65–99)

## 2017-08-11 MED ORDER — HYDRALAZINE HCL 100 MG PO TABS: 100.0000 mg | ORAL_TABLET | Freq: Three times a day (TID) | ORAL | 0 refills | Status: AC

## 2017-08-11 MED ORDER — TORSEMIDE 20 MG PO TABS: 60.0000 mg | ORAL_TABLET | Freq: Every day | ORAL | 0 refills | Status: DC

## 2017-08-11 MED ORDER — HYDRALAZINE HCL 100 MG PO TABS: 100.0000 mg | ORAL_TABLET | Freq: Three times a day (TID) | ORAL | 0 refills | Status: DC

## 2017-08-11 MED ORDER — HYDRALAZINE HCL 50 MG PO TABS: 100.0000 mg | ORAL_TABLET | Freq: Three times a day (TID) | ORAL | Status: DC

## 2017-08-11 NOTE — Progress Notes (Addendum)
Patient ID: Stacy Smith, female   DOB: 06-20-1945, 72 y.o.   MRN: [id number]     Advanced Heart Failure Rounding Note  PCP-Cardiologist: Carlyle Dolly, MD  HF Cardiology: Aundra Dubin   Subjective:    Weight down 2 lbs with PO torsemide. Creatinine stable at 2.29. No CP, SOB, dizziness. Hopeful to go home today after IV iron.   Echo: 08/07/17 EF 55-60%, moderate diastolic dysfunction, moderately dilated RV with normal systolic function, PASP 50 mmHg.   Objective:   Weight Range: 146 lb 3.2 oz (66.3 kg) Body mass index is 28.55 kg/m.   Vital Signs:   Temp:  [98.2 F (36.8 C)-99.1 F (37.3 C)] 98.6 F (37 C) (03/29 0812) Pulse Rate:  [57-61] 61 (03/29 0812) Resp:  [16-18] 16 (03/29 0812) BP: (129-156)/(42-54) 156/49 (03/29 0812) SpO2:  [96 %-98 %] 97 % (03/29 0812) Weight:  [146 lb 3.2 oz (66.3 kg)] 146 lb 3.2 oz (66.3 kg) (03/29 0429) Last BM Date: 08/11/17  Weight change: Filed Weights   08/09/17 0527 08/10/17 0512 08/11/17 0429  Weight: 151 lb 11.2 oz (68.8 kg) 148 lb 3.2 oz (67.2 kg) 146 lb 3.2 oz (66.3 kg)    Intake/Output:   Intake/Output Summary (Last 24 hours) at 08/11/2017 0840 Last data filed at 08/11/2017 0838 Gross per 24 hour  Intake 2040 ml  Output 1650 ml  Net 390 ml    Physical Exam    General: Well appearing. No resp difficulty. HEENT: Normal Neck: Supple. JVP 5-6. Carotids 2+ bilat; no bruits. No thyromegaly or nodule noted. Cor: PMI nondisplaced. RRR, No M/G/R noted Lungs: CTAB, normal effort. Abdomen: Soft, non-tender, non-distended, no HSM. No bruits or masses. +BS  Extremities: No cyanosis, clubbing, or rash. R and LLE no edema.  Neuro: Alert & orientedx3, cranial nerves grossly intact. moves all 4 extremities w/o difficulty. Affect pleasant   Telemetry   SR 60s with 1st degree AV block. Personally reviewed.   Labs    CBC Recent Labs    08/10/17 0606 08/11/17 0642  WBC 2.1* 2.6*  NEUTROABS 1.2* 1.7  HGB 8.3* 8.8*  HCT 26.8*  28.2*  MCV 90.2 90.4  PLT 114* 027*   Basic Metabolic Panel Recent Labs    08/10/17 0606 08/11/17 0642  NA 140 139  K 3.4* 3.8  CL 103 100*  CO2 24 28  GLUCOSE 165* 174*  BUN 38* 36*  CREATININE 2.29* 2.29*  CALCIUM 9.1 9.3  PHOS 4.1 3.6   Liver Function Tests Recent Labs    08/10/17 0606 08/11/17 0642  ALBUMIN 3.0* 3.2*   No results for input(s): LIPASE, AMYLASE in the last 72 hours. Cardiac Enzymes No results for input(s): CKTOTAL, CKMB, CKMBINDEX, TROPONINI in the last 72 hours.  BNP: BNP (last 3 results) Recent Labs    06/11/17 0632 06/26/17 1424 08/06/17 1428  BNP 470.6* 792.7* 953.7*    ProBNP (last 3 results) No results for input(s): PROBNP in the last 8760 hours.   D-Dimer No results for input(s): DDIMER in the last 72 hours. Hemoglobin A1C No results for input(s): HGBA1C in the last 72 hours. Fasting Lipid Panel No results for input(s): CHOL, HDL, LDLCALC, TRIG, CHOLHDL, LDLDIRECT in the last 72 hours. Thyroid Function Tests No results for input(s): TSH, T4TOTAL, T3FREE, THYROIDAB in the last 72 hours.  Invalid input(s): FREET3  Other results:   Imaging    No results found.   Medications:     Scheduled Medications: . amLODipine  5 mg Oral BID  .  aspirin EC  81 mg Oral Daily  . atorvastatin  80 mg Oral q1800  . calcitRIOL  0.5 mcg Oral Q M,W,F  . carvedilol  25 mg Oral BID WC  . darbepoetin (ARANESP) injection - NON-DIALYSIS  40 mcg Subcutaneous Q Tue-1800  . feeding supplement (ENSURE ENLIVE)  237 mL Oral TID BM  . ferrous sulfate  325 mg Oral BID WC  . heparin  5,000 Units Subcutaneous Q8H  . hydrALAZINE  75 mg Oral TID  . insulin aspart  0-9 Units Subcutaneous TID WC  . isosorbide mononitrate  30 mg Oral Daily  . lactulose  10 g Oral BID  . LORazepam  1 mg Intravenous Once  . omega-3 acid ethyl esters  1 g Oral BID  . QUEtiapine  25 mg Oral BID  . sertraline  150 mg Oral Daily  . sodium chloride flush  3 mL Intravenous  Q12H  . torsemide  60 mg Oral Daily  . traZODone  100 mg Oral QHS    Infusions: . ferric gluconate (FERRLECIT/NULECIT) IV Stopped (08/10/17 1700)    PRN Medications: acetaminophen **OR** acetaminophen, docusate sodium, gabapentin, ipratropium-albuterol, LORazepam, nitroGLYCERIN, ondansetron **OR** ondansetron (ZOFRAN) IV, sodium chloride    Patient Profile   Stacy Smith is a 72 y.o. female with CKD stage IV, Diastolic CHF, HTN, Cirrhosis, DM, Depression, and Pulmonary HTN.   Admitted 08/06/17 with volume overload and AKI.   Assessment/Plan   1. Acute on chronic diastolic CHF: Echo 9/32 with EF 55-60%, moderately dilated RV/normal systolic function, PASP 50 mmHg.  [place] 1/19 with pulmonary venous hypertension.  Diastolic CHF is worsened by CKD stage IV which promotes volume retention and makes diuresis difficult.  Volume status much improved, looks near-euvolemic.  - Continue torsemide 60 mg daily.   - Cardiomems is a consideration, if renal function remains stable this may be helpful for volume management.  2. AKI on CKD stage IV: Renal US with small left kidney, ?renovascular disease, though suspect major contributor to CKD is HTN and DM.  Creatinine trending down with diuresis, now at 2.29 which is likely her baseline. Creatinine 2.29 this am.  3. Pulmonary hypertension: This was pulmonary venous hypertension (group 2) by last RHC.  - Treatment will be effective diuresis, no role for selective pulmonary vasodilators. No change.  4. HTN: SBP 120-150s. Increase hydralazine to 100 mg TID.  5. Cirrhosis: ?NAFLD.  RV failure also likely contributes. Workup for other causes negative. No change.  6. Depression: Continue Zoloft. No change.  7. Pancytopenia: ?Related to cirrhosis.  Numbers are stable on CBC today.  Hemoglobin 8.3 > 8.8.   She has HF follow up next week. She will need a BMET and CBC.   Length of Stay: [place], NP  08/11/2017, 8:40 AM  Advanced Heart Failure  Team Pager [phone] (M-F; [place])  Please contact [facility] for night-coverage after hours (4p -7a ) and weekends on [email or web address]  Patient seen with NP, agree with the above note.  She is stable clinically, looks euvolemic. Creatinine at baseline.  Should be ok for discharge today on torsemide 60 mg daily.  Followup in CHF clinic.   Loralie Champagne 08/11/2017 12:26 PM

## 2017-08-11 NOTE — Progress Notes (Signed)
Pt got discharged to home, discharge instructions provided and patient showed understanding to it, IV taken out,Telemonitor DC,pt left unit in wheelchair with all of the belongings accompanied with a family member (Brother)  Palma Holter, Therapist, sports

## 2017-08-11 NOTE — Progress Notes (Signed)
[facility] Progress Note   72 y.o.with history of CKD stage IV, diastolic CHF, HTN, cirrhosis, DM, depression, and pulmonary hypertension presents for followup of CHF.   Baseline creatinine appears to be about 2.5 - 3 She clearly has stage 4 chronic renal disease She does not have much lower extremity edema and appears that her RHC 1/18 demonstrated fairly severe pulmonary hypertension    Assessment/ Plan:    Acute on chronic renal disease stage 4and now appears to be cardiorenal as the renal function has been improving with good diuresisduring this hospitalization.  - converted over to torsemide which she is responding to nicely.  - Stable for d/c from renal standpoint; she looks great.  - Will need f/u with [facility] (Dr. Justin Mend or 1st available).  - April 11th @ [facility]    [phone]   -> arrive @ [phone] w/ Dr. Justin Mend    Chronic renal disease stage 4Vein mappingbut does not need an access placed for now  BonesPTH 111 Ca 9 Phos 4.1  AnemiaLow iron stores start iron and esa  HTN continue diuresis Creatinine actually improved with volume removal    Subjective:   She is feeling much better; dyspnea present but much improved. She denies any n/v/f/cp.   Objective:   BP (!) 156/49 (BP Location: Left Arm)   Pulse 61   Temp 98.6 F (37 C) (Oral)   Resp 16   Ht 5' (1.524 m)   Wt 66.3 kg (146 lb 3.2 oz) Comment: scale a  SpO2 97%   BMI 28.55 kg/m   Intake/Output Summary (Last 24 hours) at 08/11/2017 1016 Last data filed at 08/11/2017 0945 Gross per 24 hour  Intake 1260 ml  Output 1750 ml  Net -490 ml   Weight change: -0.907 kg (-2 lb)  Physical Exam: GEN - NAD A&Ox3 CVS- RRRJVP increased RS- CTA ABD- BS present soft non-distended EXT- no edemabelow the knees but w/ minimal edema in thighs    Imaging: No results found.  Labs: BMET Recent Labs  Lab 08/06/17 1006 08/06/17 1604  08/07/17 3825 08/08/17 0520 08/09/17 0532 08/10/17 0606 08/11/17 0642  NA 135  --  138 141 141 140 139  K 4.3  --  3.7 3.2* 3.8 3.4* 3.8  CL 102  --  103 102 102 103 100*  CO2 22  --  25 24 27 24 28   GLUCOSE 122*  --  165* 176* 204* 165* 174*  BUN 53*  --  54* 48* 44* 38* 36*  CREATININE 3.75*  --  3.45* 2.90* 2.55* 2.29* 2.29*  CALCIUM 8.8*  --  8.5* 9.0 9.2 9.1 9.3  PHOS  --  4.3  --  4.1 4.0 4.1 3.6   CBC Recent Labs  Lab 08/08/17 0850 08/09/17 0532 08/10/17 0606 08/11/17 0642  WBC 2.2* 2.0* 2.1* 2.6*  NEUTROABS  --  1.2* 1.2* 1.7  HGB 8.2* 8.1* 8.3* 8.8*  HCT 26.2* 25.0* 26.8* 28.2*  MCV 87.9 90.3 90.2 90.4  PLT 112* 116* 114* 120*    Medications:    . amLODipine  5 mg Oral BID  . aspirin EC  81 mg Oral Daily  . atorvastatin  80 mg Oral q1800  . calcitRIOL  0.5 mcg Oral Q M,W,F  . carvedilol  25 mg Oral BID WC  . darbepoetin (ARANESP) injection - NON-DIALYSIS  40 mcg Subcutaneous Q Tue-1800  . feeding supplement (ENSURE ENLIVE)  237 mL  Oral TID BM  . ferrous sulfate  325 mg Oral BID WC  . heparin  5,000 Units Subcutaneous Q8H  . hydrALAZINE  100 mg Oral TID  . insulin aspart  0-9 Units Subcutaneous TID WC  . isosorbide mononitrate  30 mg Oral Daily  . lactulose  10 g Oral BID  . LORazepam  1 mg Intravenous Once  . omega-3 acid ethyl esters  1 g Oral BID  . QUEtiapine  25 mg Oral BID  . sertraline  150 mg Oral Daily  . sodium chloride flush  3 mL Intravenous Q12H  . torsemide  60 mg Oral Daily  . traZODone  100 mg Oral QHS      Otelia Santee, MD 08/11/2017, 10:16 AM

## 2017-08-11 NOTE — Care Management Important Message (Signed)
Important Message  Patient Details  Name: Stacy Smith MRN: [id number] Date of Birth: 1945/06/03   Medicare Important Message Given:  Yes    Erenest Rasher, RN 08/11/2017, 9:59 AM

## 2017-08-11 NOTE — Care Management Note (Signed)
Case Management Note  Patient Details  Name: Stacy Smith MRN: [id number] Date of Birth: 29-Jul-1945  Subjective/Objective:   CHF, anemia, AKI, cirrohosis                 Action/Plan: NCM spoke to pt and states she is active with HH. Dtr lives in the home. Pt active with [facility] for HHPT/RN. Spoke to rep and requested resumption of care orders be faxed to [phone].  She has RW, neb machine, Rollator, shower seat and cane at home. Notified attending for roc for [facility]   Expected Discharge Date:                  Expected Discharge Plan:  [place]  In-House Referral:  NA  Discharge planning Services  CM Consult  Post Acute Care Choice:  Home Health, Resumption of Svcs/PTA Provider Choice offered to:  Patient  DME Arranged:  N/A DME Agency:  NA  HH Arranged:  RN, PT [place] Agency:  Other - See comment  Status of Service:  Completed, signed off  If discussed at [place] of Stay Meetings, dates discussed:    Additional Comments:  Erenest Rasher, RN 08/11/2017, 10:21 AM

## 2017-08-17 ENCOUNTER — Encounter (HOSPITAL_COMMUNITY): Payer: Self-pay

## 2017-08-17 ENCOUNTER — Ambulatory Visit (HOSPITAL_COMMUNITY)
Admission: RE | Admit: 2017-08-17 | Discharge: 2017-08-17 | Disposition: A | Discharge status: Home / Self Care | Payer: Medicare HMO | Source: Ambulatory Visit | Attending: Cardiology | Admitting: Cardiology

## 2017-08-17 VITALS — BP 130/60 | HR 58 | Wt 149.4 lb

## 2017-08-17 DIAGNOSIS — Z79899 Other long term (current) drug therapy: Secondary | ICD-10-CM | POA: Insufficient documentation

## 2017-08-17 DIAGNOSIS — I50810 Right heart failure, unspecified: Secondary | ICD-10-CM | POA: Diagnosis not present

## 2017-08-17 DIAGNOSIS — Z794 Long term (current) use of insulin: Secondary | ICD-10-CM | POA: Insufficient documentation

## 2017-08-17 DIAGNOSIS — N170 Acute kidney failure with tubular necrosis: Secondary | ICD-10-CM

## 2017-08-17 DIAGNOSIS — N184 Chronic kidney disease, stage 4 (severe): Secondary | ICD-10-CM | POA: Diagnosis not present

## 2017-08-17 DIAGNOSIS — F329 Major depressive disorder, single episode, unspecified: Secondary | ICD-10-CM | POA: Insufficient documentation

## 2017-08-17 DIAGNOSIS — I10 Essential (primary) hypertension: Secondary | ICD-10-CM

## 2017-08-17 DIAGNOSIS — E1122 Type 2 diabetes mellitus with diabetic chronic kidney disease: Secondary | ICD-10-CM | POA: Insufficient documentation

## 2017-08-17 DIAGNOSIS — I130 Hypertensive heart and chronic kidney disease with heart failure and stage 1 through stage 4 chronic kidney disease, or unspecified chronic kidney disease: Secondary | ICD-10-CM | POA: Insufficient documentation

## 2017-08-17 DIAGNOSIS — K7460 Unspecified cirrhosis of liver: Secondary | ICD-10-CM | POA: Diagnosis not present

## 2017-08-17 DIAGNOSIS — I2729 Other secondary pulmonary hypertension: Secondary | ICD-10-CM

## 2017-08-17 DIAGNOSIS — I272 Pulmonary hypertension, unspecified: Secondary | ICD-10-CM | POA: Insufficient documentation

## 2017-08-17 DIAGNOSIS — K744 Secondary biliary cirrhosis: Secondary | ICD-10-CM | POA: Diagnosis not present

## 2017-08-17 DIAGNOSIS — I5032 Chronic diastolic (congestive) heart failure: Secondary | ICD-10-CM | POA: Diagnosis not present

## 2017-08-17 DIAGNOSIS — Z7982 Long term (current) use of aspirin: Secondary | ICD-10-CM | POA: Diagnosis not present

## 2017-08-17 DIAGNOSIS — M069 Rheumatoid arthritis, unspecified: Secondary | ICD-10-CM | POA: Insufficient documentation

## 2017-08-17 LAB — BASIC METABOLIC PANEL
ANION GAP: 13 (ref 5–15)
BUN: 46 mg/dL — ABNORMAL HIGH (ref 6–20)
CO2: 24 mmol/L (ref 22–32)
Calcium: 8.6 mg/dL — ABNORMAL LOW (ref 8.9–10.3)
Chloride: 100 mmol/L — ABNORMAL LOW (ref 101–111)
Creatinine, Ser: 3.95 mg/dL — ABNORMAL HIGH (ref 0.44–1.00)
GFR calc Af Amer: 12 mL/min — ABNORMAL LOW (ref >60)
GFR calc non Af Amer: 10 mL/min — ABNORMAL LOW (ref >60)
Glucose, Bld: 210 mg/dL — ABNORMAL HIGH (ref 65–99)
POTASSIUM: 3.9 mmol/L (ref 3.5–5.1)
Sodium: 137 mmol/L (ref 135–145)

## 2017-08-17 LAB — MAGNESIUM: Magnesium: 2.2 mg/dL (ref 1.7–2.4)

## 2017-08-17 LAB — CBC
HEMATOCRIT: 27.3 % — AB (ref 36.0–46.0)
Hemoglobin: 8.6 g/dL — ABNORMAL LOW (ref 12.0–15.0)
MCH: 29 pg (ref 26.0–34.0)
MCHC: 31.5 g/dL (ref 30.0–36.0)
MCV: 91.9 fL (ref 78.0–100.0)
Platelets: 110 10*3/uL — ABNORMAL LOW (ref 150–400)
RBC: 2.97 MIL/uL — AB (ref 3.87–5.11)
RDW: 18.2 % — AB (ref 11.5–15.5)
WBC: 3.6 10*3/uL — AB (ref 4.0–10.5)

## 2017-08-17 NOTE — Patient Instructions (Signed)
Routine lab work today. Will notify you of abnormal results, otherwise no news is good news!  Follow up 4 weeks with Oda Kilts PA-C.  Follow up 2-3 months with Dr. Aundra Dubin.  Take all medication as prescribed the day of your appointment. Bring all medications with you to your appointment.  Do the following things EVERYDAY: 1) Weigh yourself in the morning before breakfast. Write it down and keep it in a log. 2) Take your medicines as prescribed 3) Eat low salt foods-Limit salt (sodium) to 2000 mg per day.  4) Stay as active as you can everyday 5) Limit all fluids for the day to less than 2 liters

## 2017-08-17 NOTE — Progress Notes (Signed)
Advanced Heart Failure Clinic Note   PCP: Dr Elvina Mattes ([name], [place]) HF Cardiology: Dr Aundra Dubin   HPI: 72 y.o. with history of CKD stage IV, diastolic CHF, HTN, cirrhosis, DM, depression, and pulmonary hypertension presents for followup of CHF.   Admitted 1/26-06/19/16 with acute on chronic diastolic HF. Echo (1/18) showed EF 65-70% with grade 1 DD and mildly dilated RV. RHC showed pulmonary venous hypertension. She was diuresed with IV lasix. Coreg and hydralazine were increased. GI consulted for abdominal pain with N/V. CT abdomen showed hepatic cirrhosis. She started lactulose for elevated ammonia. She had some CP while vomiting with troponin peak of 1.08, which was thought to be demand ischemia. No cath with CKD. No further CP. Discharge weight 158 pounds.   Admitted 07/2017 with volume overload and AKI. Renal followed along. Had initially recommended Cardiomems but with worsening renal function decided low yield. Creatinine 2.29 on discharge. Lasix change to torsemide.   She presents today for follow up. After admission above. Overall feeling better from admission. Denies lightheadedness or dizziness. She is not complete sure about her medicines. Family member puts them in her pill box so she just takes them "as they are listed."   She feels like UOP has tapered off today since discharge, but she is otherwise asymptomatic.She is not orthopnea and denies CP/PND. She sees her PCP and Dr. Justin Mend with renal next week. Weight stable at 146-148 at home.   Labs (2/19): K 5.1, creatinine 2.51, BNP 793 Labs 07/14/17 K 4.2, Creatinine 2.99, Ammonia 37.  PMH: 1. Chronic diastolic CHF:  - RHC (7/06): mean RA 8, PA 78/25 mean 45, mean PCWP 18, CI 3.6, PVR 4.6 WU - Echo (9/18): EF 60-65%, normal RV systolic function, PASP 58, mild TR and MR.  - Echo (1/19): EF 65-70%, mild aortic stenosis, mildly dilated RV normal systolic function, PASP 60 mmHg.  - RHC (1/19): mean RA 13, PA 61/27, mean PCWP 20, CI 5.87  Fick (PVR 1.9 WU), CI 5.34 Thermo (PVR 2 WU).  2. Pulmonary hypertension: RHC in 1/19 suggested pulmonary venous hypertension.  - V/Q scan (1/19): No chronic or acute PE 3. Cirrhosis: Abdominal US (1/19): Liver appears cirrhotic. No gallstones.  - HBV/HCV workup negative.  - ANA negative, ASMA negative.  4. Depression 5. CKD stage 3.  6. Diabetes mellitus 7. HTN 8. Rheumatoid arthritis  Review of systems complete and found to be negative unless listed in HPI.    Social History   Socioeconomic History  . Marital status: Widowed    Spouse name: Not on file  . Number of children: Not on file  . Years of education: Not on file  . Highest education level: Not on file  Occupational History  . Not on file  Social Needs  . Financial resource strain: Not on file  . Food insecurity:    Worry: Not on file    Inability: Not on file  . Transportation needs:    Medical: Not on file    Non-medical: Not on file  Tobacco Use  . Smoking status: Never Smoker  . Smokeless tobacco: Never Used  Substance and Sexual Activity  . Alcohol use: No    Alcohol/week: 0.0 oz  . Drug use: No  . Sexual activity: Not on file  Lifestyle  . Physical activity:    Days per week: Not on file    Minutes per session: Not on file  . Stress: Not on file  Relationships  . Social connections:  Talks on phone: Not on file    Gets together: Not on file    Attends religious service: Not on file    Active member of club or organization: Not on file    Attends meetings of clubs or organizations: Not on file    Relationship status: Not on file  . Intimate partner violence:    Fear of current or ex partner: Not on file    Emotionally abused: Not on file    Physically abused: Not on file    Forced sexual activity: Not on file  Other Topics Concern  . Not on file  Social History Narrative  . Not on file   Family History  Problem Relation Age of Onset  . Heart failure Mother   . Liver cancer Father   .  Lung cancer Father   . Cancer Sister   . Heart murmur Brother     Current Outpatient Medications  Medication Sig Dispense Refill  . amLODipine (NORVASC) 5 MG tablet Take 5 mg by mouth 2 (two) times daily.     Marland Kitchen aspirin EC 81 MG tablet Take 81 mg by mouth daily.    Marland Kitchen atorvastatin (LIPITOR) 80 MG tablet Take 1 tablet (80 mg total) by mouth daily at 6 PM. 30 tablet 0  . calcitRIOL (ROCALTROL) 0.5 MCG capsule Take 0.5 mcg by mouth every Monday, Wednesday, and Friday.     . carvedilol (COREG) 25 MG tablet Take 1 tablet (25 mg total) by mouth 2 (two) times daily with a meal. 60 tablet 0  . diclofenac sodium (VOLTAREN) 1 % GEL Apply 2 g topically 4 (four) times daily as needed (Pain). 1 Tube 0  . docusate sodium (COLACE) 100 MG capsule Take 1 capsule (100 mg total) by mouth 2 (two) times daily. (Patient taking differently: Take 200 mg by mouth daily as needed for mild constipation. ) 10 capsule 0  . feeding supplement, ENSURE ENLIVE, (ENSURE ENLIVE) LIQD Take 237 mLs by mouth 3 (three) times daily between meals. 237 mL 12  . ferrous sulfate 325 (65 FE) MG tablet Take 325 mg by mouth 2 (two) times daily with a meal.     . hydrALAZINE (APRESOLINE) 100 MG tablet Take 1 tablet (100 mg total) by mouth 3 (three) times daily. 90 tablet 0  . insulin aspart (NOVOLOG FLEXPEN) 100 UNIT/ML FlexPen Inject into the skin 3 (three) times daily with meals. Per sliding scale    . ipratropium-albuterol (DUONEB) 0.5-2.5 (3) MG/3ML SOLN Take 3 mLs by nebulization every 6 (six) hours as needed (for shortness of breath).     . isosorbide mononitrate (IMDUR) 30 MG 24 hr tablet Take 1 tablet (30 mg total) by mouth daily. 30 tablet 0  . lactulose (CHRONULAC) 10 GM/15ML solution Take 45 mLs (30 g total) by mouth 2 (two) times daily. (Patient taking differently: Take 10 g by mouth 2 (two) times daily. ) 240 mL 0  . Omega-3 Fatty Acids (OMEGA 3 PO) Take 520 mg by mouth 2 (two) times daily.    . ondansetron (ZOFRAN-ODT) 8 MG  disintegrating tablet Take 8 mg by mouth daily as needed for nausea or vomiting.     Marland Kitchen oxyCODONE (OXY IR/ROXICODONE) 5 MG immediate release tablet Take 5 mg by mouth every 6 (six) hours as needed for severe pain.    Marland Kitchen QUEtiapine (SEROQUEL) 25 MG tablet Take 25 mg by mouth 2 (two) times daily.    . sertraline (ZOLOFT) 50 MG tablet Take 3 tablets (  150 mg total) by mouth daily. 90 tablet 0  . sodium chloride (OCEAN) 0.65 % SOLN nasal spray Place 1 spray into both nostrils as needed for congestion. 1 Bottle 0  . torsemide (DEMADEX) 20 MG tablet Take 3 tablets (60 mg total) by mouth daily. 90 tablet 0  . traZODone (DESYREL) 100 MG tablet Take 1 tablet (100 mg total) by mouth at bedtime. 30 tablet 0  . Vitamin D, Ergocalciferol, (DRISDOL) 50000 units CAPS capsule Take 50,000 Units by mouth every 7 (seven) days.     No current facility-administered medications for this encounter.     Vitals:   08/17/17 1345  BP: 130/60  Pulse: (!) 58  SpO2: 93%  Weight: 149 lb 6.4 oz (67.8 kg)   Wt Readings from Last 3 Encounters:  08/17/17 149 lb 6.4 oz (67.8 kg)  08/11/17 146 lb 3.2 oz (66.3 kg)  07/20/17 159 lb 12.8 oz (72.5 kg)     PHYSICAL EXAM:    General: Elderly appearing. No resp difficulty. HEENT: Normal Neck: Supple. JVP 5-6. Carotids 2+ bilat; no bruits. No thyromegaly or nodule noted. Cor: PMI nondisplaced. RRR, No M/G/R noted Lungs: CTAB, normal effort. Abdomen: Soft, non-tender, non-distended, no HSM. No bruits or masses. +BS  Extremities: No cyanosis, clubbing, or rash. R and LLE no edema.  Neuro: Alert & orientedx3, cranial nerves grossly intact. moves all 4 extremities w/o difficulty. Affect pleasant   ASSESSMENT & PLAN: 1. Chronic diastolic CHF: Echo 2/35: EF 65-70%, mildly dilated RV. [place] in 1/18 with severe pulmonary hypertension, likely mixed pulmonary venous and pulmonary arterial HTN with PVR 4.6 WU. [place] 06/12/17, however, showed pulmonary venous hypertension and elevated  filling pressures with PVR about 2 WU.   - Volume status stable on exam - NYHA class III symptoms - Continue torsemide 60 mg daily. BMET today.  - Not candidate for CardioMems with CKD IV and consideration for HD.  - Wear graded compression stockings.  - Reinforced fluid restriction to < 2 L daily, sodium restriction to less than 2000 mg daily, and the importance of daily weights.   2. Pulmonary HTN: Pulmonary venous hypertension based on most recent RHC.  - Continue diuresis. No change.  - No role for selective pulmonary vasodilators at this point.  3. AKI on CKD stage 4 - BMET today. Sees Dr. Justin Mend next week.  - She has had vein mapping.  4. HTN:  - Stable. No change with CKD IV.  5. Cirrhosis: Negative workup for viral and autoimmune hepatitis.  She does not drink ETOH.   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  - Stable. No confusion.  6. Depression:  - Continue Zoloft. Per PCP.   BMET today. No adjustment to meds with recent ARF.  She has close nephrology follow up. Will forward any labs of interest. RTC 4 weeks. Sooner with symptoms, though if renal function continues to worsen, suspect we will take back seat to Nephrology.   Shirley Friar, PA-C  08/17/2017   Greater than 50% of the 25 minute visit was spent in counseling/coordination of care regarding disease state education, salt/fluid restriction, sliding scale diuretics, and medication compliance.

## 2017-08-18 ENCOUNTER — Telehealth (HOSPITAL_COMMUNITY): Payer: Self-pay

## 2017-08-18 MED ORDER — TORSEMIDE 20 MG PO TABS: 40.0000 mg | ORAL_TABLET | Freq: Every day | ORAL | 0 refills | Status: DC

## 2017-08-18 NOTE — Telephone Encounter (Signed)
Notes recorded by Shirley Muscat, RN on 08/18/2017 at 10:32 AM EDT Pt aware of results and agreeable to med changes (changes made in [facility])  ------  Notes recorded by Kerry Dory, CMA on 08/18/2017 at 8:17 AM EDT Left message for patient to call back.  [phone] (H) ------  Notes recorded by Shirley Friar, PA-C on 08/18/2017 at 7:46 AM EDT Please call today.     Legrand Como [address]" [place], PA-C 08/18/2017 7:46 AM ------  Notes recorded by Shirley Friar, PA-C on 08/17/2017 at 3:07 PM EDT Creatinine elevated. Hold torsemide x 2 days, then resume at 40 mg daily. Will forward to Dr. Justin Mend as Juluis Rainier. He sees her next week.  She was relatively asymptomatic at visit today.    Legrand Como [address] [place], PA-C 08/17/2017 3:07 PM

## 2017-08-18 NOTE — Discharge Summary (Signed)
[facility] Discharge Summary   Patient: Stacy Smith:[id number]   PCP: Jacqualine Code, DO DOB: 1946-05-02   Date of admission: 08/06/2017   Date of discharge: 08/11/2017     Discharge Diagnoses:  Principal Problem:   Anasarca Active Problems:   Convulsions/seizures ([place])   Diabetes ([place])   Essential hypertension   Diastolic CHF ([place])   Acute on chronic kidney failure ([place])   Admitted From: home Disposition:  Home with home health  Recommendations for Outpatient Follow-up:  1. Please follow up with PCP, cardiology, nephrology as recommended    [facility] Follow up.   Why:  Home Health RN and Physical Therapy- agency will call to arrange initial visit Contact information: [phone]       Jacqualine Code, DO. Schedule an appointment as soon as possible for a visit in 1 week(s).   Specialty:  Family Medicine Contact information: [address] [place] [address] [phone]        Arnoldo Lenis, MD .   Specialty:  Cardiology Contact information: [place] [address] [phone]          Diet recommendation: renal diet  Activity: The patient is advised to gradually reintroduce usual activities.  Discharge Condition: good  Code Status: full code  History of present illness: As per the H and P dictated on admission, "Stacy Smith is a 72 y.o. female past medical history significant for heart failure, CKD stage IV, diabetes, lupus, rheumatoid arthritis, questionable seizures presents the emergency room with a chief complaint of shortness of breath and swelling.  Patient states that little over week ago she had a stent in the funeral she believes is when her swelling started.  Additional symptom of shortness of breath and cough starting last Tuesday.  Both of those symptoms started to worsen.  Patient's cough became productive.  Color unknown.  Swelling ran up  patient's legs and back.  Patient states this is the worst it has ever been.  She states that she has continued to take her Lasix throughout all of this but the amount of urine she makes after taking it has decreased.  ED course: In the emergency room patient was found to not be hypoxic.  Chest x-ray showed vascular congestion.  Patient with dependent edema going up the legs and back on physical exam.  Patient was given Lasix.  Hospitalist consulted for admission."  Hospital Course:  Summary of her active problems in the hospital is as following. 1.  Acute on chronic diastolic CHF. Acute on chronic hypoxic respiratory failure. Ejection fraction shows 55-60% EF, dilated RV. Started on IV Lasix, currently on 80 mg 3 times daily IV. Appreciate CHF team management. Recommend transitioning IV diuresis to oral and monitor as outpt.  2.  Acute kidney injury on chronic kidney disease stage IV. Renal ultrasound with small left kidney. Appreciate nephrology input. Vein mapping performed. No indication at present for inpatient HD, patient will probably get outpatient vascular access placement. Stable from nephrology perspective to be discharged home.  3.  Anemia of chronic kidney disease. Iron deficiency. Getting iron replacement as well as ESA.  4.  Cirrhosis of the liver. ?  NAF LD. No decompensation for now, monitor for now.  5.  Essential hypertension. Blood pressure well controlled. Continue current management.  6.  Type 2 diabetes mellitus. On sliding scale insulin. Continue home regimen.   7.  Chest pain. EKG  unremarkable,troponin negative, consservative management    All other chronic medical condition were stable during the hospitalization.  Patient was seen by physical therapy, who recommended home health, which was arranged by Education officer, museum and case Freight forwarder. On the day of the discharge the patient's vitals were stable, and no other acute medical condition were  reported by patient. the patient was felt safe to be discharge at home with family.  Procedures and Results:  none   Consultations:  Cardiology  Nephrology   DISCHARGE MEDICATION: Allergies as of 08/11/2017      Reactions   Phenytoin Sodium Extended Itching   Potassium-containing Compounds Other (See Comments)   unspecified   Latex Swelling      Medication List    STOP taking these medications   clonazePAM 0.5 MG tablet Commonly known as:  KLONOPIN   cloNIDine 0.2 MG tablet Commonly known as:  CATAPRES   furosemide 80 MG tablet Commonly known as:  LASIX   gabapentin 100 MG capsule Commonly known as:  NEURONTIN   guaiFENesin 600 MG 12 hr tablet Commonly known as:  MUCINEX   insulin detemir 100 UNIT/ML injection Commonly known as:  LEVEMIR     TAKE these medications   amLODipine 5 MG tablet Commonly known as:  NORVASC Take 5 mg by mouth 2 (two) times daily.   aspirin EC 81 MG tablet Take 81 mg by mouth daily.   atorvastatin 80 MG tablet Commonly known as:  LIPITOR Take 1 tablet (80 mg total) by mouth daily at 6 PM.   calcitRIOL 0.5 MCG capsule Commonly known as:  ROCALTROL Take 0.5 mcg by mouth every Monday, Wednesday, and Friday.   carvedilol 25 MG tablet Commonly known as:  COREG Take 1 tablet (25 mg total) by mouth 2 (two) times daily with a meal.   diclofenac sodium 1 % Gel Commonly known as:  VOLTAREN Apply 2 g topically 4 (four) times daily as needed (Pain).   docusate sodium 100 MG capsule Commonly known as:  COLACE Take 1 capsule (100 mg total) by mouth 2 (two) times daily. What changed:    how much to take  when to take this  reasons to take this   feeding supplement (ENSURE ENLIVE) Liqd Take 237 mLs by mouth 3 (three) times daily between meals.   ferrous sulfate 325 (65 FE) MG tablet Take 325 mg by mouth 2 (two) times daily with a meal.   hydrALAZINE 100 MG tablet Commonly known as:  APRESOLINE Take 1 tablet (100 mg total)  by mouth 3 (three) times daily. What changed:    medication strength  how much to take  when to take this   ipratropium-albuterol 0.5-2.5 (3) MG/3ML Soln Commonly known as:  DUONEB Take 3 mLs by nebulization every 6 (six) hours as needed (for shortness of breath).   isosorbide mononitrate 30 MG 24 hr tablet Commonly known as:  IMDUR Take 1 tablet (30 mg total) by mouth daily.   lactulose 10 GM/15ML solution Commonly known as:  CHRONULAC Take 45 mLs (30 g total) by mouth 2 (two) times daily. What changed:  how much to take   NOVOLOG FLEXPEN 100 UNIT/ML FlexPen Generic drug:  insulin aspart Inject into the skin 3 (three) times daily with meals. Per sliding scale   OMEGA 3 PO Take 520 mg by mouth 2 (two) times daily.   ondansetron 8 MG disintegrating tablet Commonly known as:  ZOFRAN-ODT Take 8 mg by mouth daily as needed for nausea or vomiting.  oxyCODONE 5 MG immediate release tablet Commonly known as:  Oxy IR/ROXICODONE Take 5 mg by mouth every 6 (six) hours as needed for severe pain.   QUEtiapine 25 MG tablet Commonly known as:  SEROQUEL Take 25 mg by mouth 2 (two) times daily.   sertraline 50 MG tablet Commonly known as:  ZOLOFT Take 3 tablets (150 mg total) by mouth daily.   sodium chloride 0.65 % Soln nasal spray Commonly known as:  OCEAN Place 1 spray into both nostrils as needed for congestion.   traZODone 100 MG tablet Commonly known as:  DESYREL Take 1 tablet (100 mg total) by mouth at bedtime.   Vitamin D (Ergocalciferol) 50000 units Caps capsule Commonly known as:  DRISDOL Take 50,000 Units by mouth every 7 (seven) days.      Allergies  Allergen Reactions  . Phenytoin Sodium Extended Itching  . Potassium-Containing Compounds Other (See Comments)    unspecified  . Latex Swelling   Discharge Instructions    Diet - low sodium heart healthy   Complete by:  As directed    Discharge instructions   Complete by:  As directed    It is important  that you read following instructions as well as go over your medication list with RN to help you understand your care after this hospitalization.  Discharge Instructions: Please follow-up with PCP in one week  Please request your primary care physician to go over all Hospital Tests and Procedure/Radiological results at the follow up,  Please get all Hospital records sent to your PCP by signing hospital release before you go home.   Do not take more than prescribed Pain, Sleep and Anxiety Medications. You were cared for by a hospitalist during your hospital stay. If you have any questions about your discharge medications or the care you received while you were in the hospital after you are discharged, you can call the unit and ask to speak with the hospitalist on call if the hospitalist that took care of you is not available.  Once you are discharged, your primary care physician will handle any further medical issues. Please note that NO REFILLS for any discharge medications will be authorized once you are discharged, as it is imperative that you return to your primary care physician (or establish a relationship with a primary care physician if you do not have one) for your aftercare needs so that they can reassess your need for medications and monitor your lab values. You Must read complete instructions/literature along with all the possible adverse reactions/side effects for all the Medicines you take and that have been prescribed to you. Take any new Medicines after you have completely understood and accept all the possible adverse reactions/side effects. Wear Seat belts while driving. If you have smoked or chewed Tobacco in the last 2 yrs please stop smoking and/or stop any Recreational drug use.   Increase activity slowly   Complete by:  As directed      Discharge Exam: Filed Weights   08/09/17 0527 08/10/17 0512 08/11/17 0429  Weight: 68.8 kg (151 lb 11.2 oz) 67.2 kg (148 lb 3.2 oz) 66.3  kg (146 lb 3.2 oz)   Vitals:   08/11/17 0812 08/11/17 1100  BP: (!) 156/49 (!) 127/38  Pulse: 61 (!) 58  Resp: 16 16  Temp: 98.6 F (37 C) 98.2 F (36.8 C)  SpO2: 97% 95%   General: Appear in no distress, no Rash; Oral Mucosa moist. Cardiovascular: S1 and S2 Present, no Murmur,  no JVD Respiratory: Bilateral Air entry present and Clear to Auscultation, no Crackles, no wheezes Abdomen: Bowel Sound present, Soft and no tenderness Extremities: no Pedal edema, no calf tenderness Neurology: Grossly no focal neuro deficit.  The results of significant diagnostics from this hospitalization (including imaging, microbiology, ancillary and laboratory) are listed below for reference.    Significant Diagnostic Studies: Dg Chest 2 View  Result Date: 08/06/2017 CLINICAL DATA:  Lower extremity edema for 5 days, fluid buildup, shortness of breath overnight, history CHF, diabetes mellitus, hypertension EXAM: CHEST - 2 VIEW COMPARISON:  06/19/2017 FINDINGS: Enlargement of cardiac silhouette with pulmonary vascular congestion. Mediastinal contours normal. Bibasilar small pleural effusions. No acute infiltrate or pneumothorax. Bones demineralized. IMPRESSION: Enlargement of cardiac silhouette with pulmonary vascular congestion. Small bibasilar pleural effusions. Electronically Signed   By: Lavonia Dana M.D.   On: 08/06/2017 11:42   Dg Abd 1 View  Result Date: 08/06/2017 CLINICAL DATA:  Umbilical pain. EXAM: ABDOMEN - 1 VIEW COMPARISON:  None. FINDINGS: Bowel gas pattern is nonobstructive. No mass or mass effect. No free peritoneal air. Few pelvic phleboliths are present. There is mild degenerate change of the spine and hips. IMPRESSION: Nonobstructive bowel gas pattern. Electronically Signed   By: Marin Olp M.D.   On: 08/06/2017 16:57   US Renal  Result Date: 08/06/2017 CLINICAL DATA:  Acute renal insufficiency. EXAM: RENAL / URINARY TRACT ULTRASOUND COMPLETE COMPARISON:  CT 06/17/2017 FINDINGS: Right  Kidney: Length: 11.6 cm. Echogenicity within normal limits. Mild prominence of the central intrarenal collecting system and renal pelvis. No focal mass. Left Kidney: Length: 8.4 cm. Echogenicity within normal limits. No mass or hydronephrosis visualized. Bladder: Appears normal for degree of bladder distention. Ureteral jets not visualized. Ascites. IMPRESSION: Mild size discrepancy with slightly small left kidney. Mild dilatation of the central right intrarenal collecting system/renal pelvis. Electronically Signed   By: Marin Olp M.D.   On: 08/06/2017 16:56    Microbiology: No results found for this or any previous visit (from the past 240 hour(s)).   Labs: CBC: Recent Labs  Lab 08/17/17 1400  WBC 3.6*  HGB 8.6*  HCT 27.3*  MCV 91.9  PLT 301*   Basic Metabolic Panel: Recent Labs  Lab 08/17/17 1400  NA 137  K 3.9  CL 100*  CO2 24  GLUCOSE 210*  BUN 46*  CREATININE 3.95*  CALCIUM 8.6*  MG 2.2   Liver Function Tests: No results for input(s): AST, ALT, ALKPHOS, BILITOT, PROT, ALBUMIN in the last 168 hours. No results for input(s): LIPASE, AMYLASE in the last 168 hours. No results for input(s): AMMONIA in the last 168 hours. Cardiac Enzymes: No results for input(s): CKTOTAL, CKMB, CKMBINDEX, TROPONINI in the last 168 hours. BNP (last 3 results) Recent Labs    06/11/17 0632 06/26/17 1424 08/06/17 1428  BNP 470.6* 792.7* 953.7*   CBG: No results for input(s): GLUCAP in the last 168 hours. Time spent: 35 minutes  Signed:  Berle Mull  [facility] 08/11/2017 , 3:01 PM

## 2017-08-21 ENCOUNTER — Ambulatory Visit: Payer: Self-pay | Admitting: Gastroenterology

## 2017-09-11 ENCOUNTER — Ambulatory Visit: Payer: Medicare HMO | Admitting: Gastroenterology

## 2017-09-11 ENCOUNTER — Other Ambulatory Visit: Payer: Self-pay | Admitting: Gastroenterology

## 2017-09-11 ENCOUNTER — Encounter (INDEPENDENT_AMBULATORY_CARE_PROVIDER_SITE_OTHER): Payer: Self-pay

## 2017-09-11 ENCOUNTER — Other Ambulatory Visit (INDEPENDENT_AMBULATORY_CARE_PROVIDER_SITE_OTHER): Payer: Medicare HMO

## 2017-09-11 ENCOUNTER — Encounter: Payer: Self-pay | Admitting: Gastroenterology

## 2017-09-11 VITALS — BP 120/44 | HR 60 | Ht 59.0 in | Wt 141.4 lb

## 2017-09-11 DIAGNOSIS — K7460 Unspecified cirrhosis of liver: Secondary | ICD-10-CM

## 2017-09-11 LAB — COMPREHENSIVE METABOLIC PANEL
ALBUMIN: 3.8 g/dL (ref 3.5–5.2)
ALT: 9 U/L (ref 0–35)
AST: 18 U/L (ref 0–37)
Alkaline Phosphatase: 65 U/L (ref 39–117)
BUN: 40 mg/dL — AB (ref 6–23)
CHLORIDE: 97 meq/L (ref 96–112)
CO2: 28 mEq/L (ref 19–32)
CREATININE: 3.46 mg/dL — AB (ref 0.40–1.20)
Calcium: 9 mg/dL (ref 8.4–10.5)
GFR: 13.82 mL/min — CL (ref >60.00)
GLUCOSE: 262 mg/dL — AB (ref 70–99)
POTASSIUM: 4.9 meq/L (ref 3.5–5.1)
SODIUM: 136 meq/L (ref 135–145)
Total Bilirubin: 0.7 mg/dL (ref 0.2–1.2)
Total Protein: 7 g/dL (ref 6.0–8.3)

## 2017-09-11 LAB — PROTIME-INR
INR: 1.2 ratio — ABNORMAL HIGH (ref 0.8–1.0)
PROTHROMBIN TIME: 14.4 s — AB (ref 9.6–13.1)

## 2017-09-11 NOTE — Progress Notes (Signed)
Review of pertinent gastrointestinal problems: 1. 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  Transaminases normal INR 1.2, total bilirubin 0.6  Most recent alpha-fetoprotein 06/2017 was normal  Liver imaging ultrasound January 2019 showed cirrhosis without mass lesions, CT scan February 2019 showed cirrhosis without liver masses  Upper endoscopy; not performed since she was already on Coreg  MELD 06/2017 labs: 21 (mainly due to Cr 3.9)  Not a liver transplant candidate given severe cardio/pulm disease and CKD   HPI: This is a very pleasant 72 year old woman whom I last saw when she was hospitalized at [facility] for CHF exacerbation about 2 or 3 months ago.  I was consulted to comment on her underlying cirrhosis which she had known about for probably for 5 years previously.  She has been hospitalized one more time since that time for further diuresis from her CHF.  For the past month or so she has been doing well.  Her fluid status is under better control.  Diuretics are being managed by CHF team  She has no shortness of breath, no overt GI bleeding, no overt encephalopathy  Chief complaint is cirrhosis  ROS: complete GI ROS as described in HPI, all other review negative.  Constitutional:  No unintentional weight loss   Past Medical History:  Diagnosis Date  . Anxiety   . Bradycardia   . CHF (congestive heart failure) ([place])   . Chronic kidney disease    CKD Stage 3  . Depression   . Diabetes mellitus without complication ([place])   . Hyperkalemia   . Hypertension   . Lupus ([place])   . Peripheral neuropathy   . Rheumatoid arthritis ([place])   . Seizures ([place])   . Shortness of breath  dyspnea     Past Surgical History:  Procedure Laterality Date  . ABDOMINAL HYSTERECTOMY    . APPENDECTOMY    . CARDIAC CATHETERIZATION N/A 05/18/2016   Procedure: Right Heart Cath;  Surgeon: Burnell Blanks, MD;  Location: [place] [facility];  Service: Cardiovascular;  Laterality: N/A;  . CYST EXCISION    . DENTAL SURGERY  01/18/15  . RIGHT HEART CATH N/A 06/12/2017   Procedure: RIGHT HEART CATH;  Surgeon: Larey Dresser, MD;  Location: [place] [facility];  Service: Cardiovascular;  Laterality: N/A;    Current Outpatient Medications  Medication Sig Dispense Refill  . amLODipine (NORVASC) 5 MG tablet Take 5 mg by mouth 2 (two) times daily.     Marland Kitchen aspirin EC 81 MG tablet Take 81 mg by mouth daily.    Marland Kitchen atorvastatin (LIPITOR) 80 MG tablet Take 1 tablet (80 mg total) by mouth daily at 6 PM. 30 tablet 0  . calcitRIOL (ROCALTROL) 0.5 MCG capsule Take 0.5 mcg by mouth every Monday, Wednesday, and Friday.     . carvedilol (COREG) 25 MG tablet Take 1 tablet (25 mg total) by mouth 2 (two) times daily with a meal. 60 tablet 0  . diclofenac sodium (VOLTAREN) 1 % GEL Apply 2 g topically 4 (four) times daily as needed (Pain). 1 Tube 0  . docusate sodium (COLACE) 100 MG capsule Take 1 capsule (100 mg total) by mouth 2 (two) times daily. (Patient taking differently: Take 200 mg by mouth daily as needed for mild  constipation. ) 10 capsule 0  . feeding supplement, ENSURE ENLIVE, (ENSURE ENLIVE) LIQD Take 237 mLs by mouth 3 (three) times daily between meals. 237 mL 12  . ferrous sulfate 325 (65 FE) MG tablet Take 325 mg by mouth 2 (two) times daily with a meal.     . hydrALAZINE (APRESOLINE) 100 MG tablet Take 1 tablet (100 mg total) by mouth 3 (three) times daily. 90 tablet 0  . insulin aspart (NOVOLOG FLEXPEN) 100 UNIT/ML FlexPen Inject into the skin 3 (three) times daily with meals. Per sliding scale    . ipratropium-albuterol (DUONEB) 0.5-2.5 (3) MG/3ML SOLN Take 3 mLs by nebulization every 6 (six)  hours as needed (for shortness of breath).     . isosorbide mononitrate (IMDUR) 30 MG 24 hr tablet Take 1 tablet (30 mg total) by mouth daily. 30 tablet 0  . lactulose (CHRONULAC) 10 GM/15ML solution Take 45 mLs (30 g total) by mouth 2 (two) times daily. 240 mL 0  . Omega-3 Fatty Acids (OMEGA 3 PO) Take 520 mg by mouth 2 (two) times daily.    . ondansetron (ZOFRAN-ODT) 8 MG disintegrating tablet Take 8 mg by mouth daily as needed for nausea or vomiting.     Marland Kitchen oxyCODONE (OXY IR/ROXICODONE) 5 MG immediate release tablet Take 5 mg by mouth every 6 (six) hours as needed for severe pain.    Marland Kitchen QUEtiapine (SEROQUEL) 25 MG tablet Take 25 mg by mouth 2 (two) times daily.    . sertraline (ZOLOFT) 50 MG tablet Take 100 mg by mouth 2 (two) times daily.    . sodium chloride (OCEAN) 0.65 % SOLN nasal spray Place 1 spray into both nostrils as needed for congestion. 1 Bottle 0  . torsemide (DEMADEX) 20 MG tablet Take 2 tablets (40 mg total) by mouth daily. 60 tablet 0  . traZODone (DESYREL) 100 MG tablet Take 1 tablet (100 mg total) by mouth at bedtime. 30 tablet 0  . Vitamin D, Ergocalciferol, (DRISDOL) 50000 units CAPS capsule Take 50,000 Units by mouth every 7 (seven) days.     No current facility-administered medications for this visit.     Allergies as of 09/11/2017 - Review Complete 09/11/2017  Allergen Reaction Noted  . Phenytoin sodium extended Itching 08/07/2015  . Potassium-containing compounds Other (See Comments) 08/06/2017  . Latex Swelling 01/22/2015    Family History  Problem Relation Age of Onset  . Heart failure Mother   . Liver cancer Father   . Lung cancer Father   . Cancer Sister   . Heart murmur Brother     Social History   Socioeconomic History  . Marital status: Widowed    Spouse name: Not on file  . Number of children: Not on file  . Years of education: Not on file  . Highest education level: Not on file  Occupational History  . Not on file  Social Needs  .  Financial resource strain: Not on file  . Food insecurity:    Worry: Not on file    Inability: Not on file  . Transportation needs:    Medical: Not on file    Non-medical: Not on file  Tobacco Use  . Smoking status: Never Smoker  . Smokeless tobacco: Never Used  Substance and Sexual Activity  . Alcohol use: No    Alcohol/week: 0.0 oz  . Drug use: No  . Sexual activity: Not on file  Lifestyle  . Physical activity:    Days per week: Not on  file    Minutes per session: Not on file  . Stress: Not on file  Relationships  . Social connections:    Talks on phone: Not on file    Gets together: Not on file    Attends religious service: Not on file    Active member of club or organization: Not on file    Attends meetings of clubs or organizations: Not on file    Relationship status: Not on file  . Intimate partner violence:    Fear of current or ex partner: Not on file    Emotionally abused: Not on file    Physically abused: Not on file    Forced sexual activity: Not on file  Other Topics Concern  . Not on file  Social History Narrative  . Not on file     Physical Exam: BP (!) 120/44 (BP Location: Left Arm, Patient Position: Sitting, Cuff Size: Normal)   Pulse 60   Ht 4\' 11"  (1.499 m) Comment: height measured without shoes  Wt 141 lb 6 oz (64.1 kg)   BMI 28.55 kg/m  Constitutional: generally well-appearing Psychiatric: alert and oriented x3 Abdomen: soft, nontender, nondistended, no obvious ascites, no peritoneal signs, normal bowel sounds No peripheral edema noted in lower extremities  Assessment and plan: 72 y.o. female with cirrhosis  Her fluid status is under good control, diuretics are managed by her CHF team.  Her last creatinine that I can see is around 4.  I would like to restage her general liver disease get a current meld score for her as well as check to see if she is immune to hepatitis A and B.  If she is not immune then I will order an immunization series.   She will be due for hepatoma screening in August 2019 by alpha-fetoprotein and liver imaging with ultrasound.  She will return to see me in 6 months and sooner if needed.  Please see the "Patient Instructions" section for addition details about the plan.  Owens Loffler, MD [facility] 09/11/2017, 1:21 PM

## 2017-09-11 NOTE — Patient Instructions (Addendum)
You will have labs checked today in the basement lab.  Please head down after you check out with the front desk  (cmet, inr, hepatitis B surface antibody, hepatitis A total antibody.)  Will decide about hepatitis A, B immunization series pending those results. August 2019 [place] of liver and AFP for hepatoma screening. Please return to see Dr. Ardis Hughs in 6 months.  Normal BMI (Body Mass Index- based on height and weight) is between 23 and 30. Your BMI today is Body mass index is 28.55 kg/m. Marland Kitchen Please consider follow up  regarding your BMI with your Primary Care Provider.

## 2017-09-12 LAB — HEPATITIS B SURFACE ANTIBODY,QUALITATIVE: Hep B S Ab: NONREACTIVE

## 2017-09-12 LAB — HEPATITIS A ANTIBODY, TOTAL: HEPATITIS A AB,TOTAL: NONREACTIVE

## 2017-09-13 NOTE — Progress Notes (Signed)
Advanced Heart Failure Clinic Note   PCP: Dr Elvina Mattes ([name], [place]) HF Cardiology: Dr Aundra Dubin   HPI: 72 y.o. with history of CKD stage IV, diastolic CHF, HTN, cirrhosis, DM, depression, and pulmonary hypertension presents for followup of CHF.   Admitted 1/26-06/19/16 with acute on chronic diastolic HF. Echo (1/18) showed EF 65-70% with grade 1 DD and mildly dilated RV. RHC showed pulmonary venous hypertension. She was diuresed with IV lasix. Coreg and hydralazine were increased. GI consulted for abdominal pain with N/V. CT abdomen showed hepatic cirrhosis. She started lactulose for elevated ammonia. She had some CP while vomiting with troponin peak of 1.08, which was thought to be demand ischemia. No cath with CKD. No further CP. Discharge weight 158 pounds.   Admitted 07/2017 with volume overload and AKI. Renal followed along. Had initially recommended Cardiomems but with worsening renal function decided low yield. Creatinine 2.29 on discharge. Lasix change to torsemide.   She presents today for regular follow up. No adjustments made at last visit due to recent AKI and CKD IV. Weight down about 10 lbs from last visit. Feels like she is making good UOP. Saw Dr. Justin Mend and no plans for HD at this time. Scheduled for 4 month follow up. She denies DOE with daily activities. No lightheadedness or dizziness. Taking all medications as directed.   EKG Sinus bradycardia 46 bpm, personally reviewed.  Labs (2/19): K 5.1, creatinine 2.51, BNP 793 Labs 07/14/17 K 4.2, Creatinine 2.99, Ammonia 37.  PMH: 1. Chronic diastolic CHF:  - RHC (1/76): mean RA 8, PA 78/25 mean 45, mean PCWP 18, CI 3.6, PVR 4.6 WU - Echo (9/18): EF 60-65%, normal RV systolic function, PASP 58, mild TR and MR.  - Echo (1/19): EF 65-70%, mild aortic stenosis, mildly dilated RV normal systolic function, PASP 60 mmHg.  - RHC (1/19): mean RA 13, PA 61/27, mean PCWP 20, CI 5.87 Fick (PVR 1.9 WU), CI 5.34 Thermo (PVR 2 WU).  2. Pulmonary  hypertension: RHC in 1/19 suggested pulmonary venous hypertension.  - V/Q scan (1/19): No chronic or acute PE 3. Cirrhosis: Abdominal US (1/19): Liver appears cirrhotic. No gallstones.  - HBV/HCV workup negative.  - ANA negative, ASMA negative.  4. Depression 5. CKD stage 3.  6. Diabetes mellitus 7. HTN 8. Rheumatoid arthritis  Review of systems complete and found to be negative unless listed in HPI.    Social History   Socioeconomic History  . Marital status: Widowed    Spouse name: Not on file  . Number of children: Not on file  . Years of education: Not on file  . Highest education level: Not on file  Occupational History  . Not on file  Social Needs  . Financial resource strain: Not on file  . Food insecurity:    Worry: Not on file    Inability: Not on file  . Transportation needs:    Medical: Not on file    Non-medical: Not on file  Tobacco Use  . Smoking status: Never Smoker  . Smokeless tobacco: Never Used  Substance and Sexual Activity  . Alcohol use: No    Alcohol/week: 0.0 oz  . Drug use: No  . Sexual activity: Not on file  Lifestyle  . Physical activity:    Days per week: Not on file    Minutes per session: Not on file  . Stress: Not on file  Relationships  . Social connections:    Talks on phone: Not on file  Gets together: Not on file    Attends religious service: Not on file    Active member of club or organization: Not on file    Attends meetings of clubs or organizations: Not on file    Relationship status: Not on file  . Intimate partner violence:    Fear of current or ex partner: Not on file    Emotionally abused: Not on file    Physically abused: Not on file    Forced sexual activity: Not on file  Other Topics Concern  . Not on file  Social History Narrative  . Not on file   Family History  Problem Relation Age of Onset  . Heart failure Mother   . Liver cancer Father   . Lung cancer Father   . Cancer Sister   . Heart murmur  Brother     Current Outpatient Medications  Medication Sig Dispense Refill  . amLODipine (NORVASC) 5 MG tablet Take 5 mg by mouth 2 (two) times daily.     Marland Kitchen aspirin EC 81 MG tablet Take 81 mg by mouth daily.    Marland Kitchen atorvastatin (LIPITOR) 80 MG tablet Take 1 tablet (80 mg total) by mouth daily at 6 PM. 30 tablet 0  . calcitRIOL (ROCALTROL) 0.5 MCG capsule Take 0.5 mcg by mouth every Monday, Wednesday, and Friday.     . carvedilol (COREG) 25 MG tablet Take 1 tablet (25 mg total) by mouth 2 (two) times daily with a meal. 60 tablet 0  . diclofenac sodium (VOLTAREN) 1 % GEL Apply 2 g topically 4 (four) times daily as needed (Pain). 1 Tube 0  . docusate sodium (COLACE) 100 MG capsule Take 1 capsule (100 mg total) by mouth 2 (two) times daily. (Patient taking differently: Take 200 mg by mouth daily as needed for mild constipation. ) 10 capsule 0  . feeding supplement, ENSURE ENLIVE, (ENSURE ENLIVE) LIQD Take 237 mLs by mouth 3 (three) times daily between meals. 237 mL 12  . ferrous sulfate 325 (65 FE) MG tablet Take 325 mg by mouth 2 (two) times daily with a meal.     . hydrALAZINE (APRESOLINE) 100 MG tablet Take 1 tablet (100 mg total) by mouth 3 (three) times daily. 90 tablet 0  . insulin aspart (NOVOLOG FLEXPEN) 100 UNIT/ML FlexPen Inject into the skin 3 (three) times daily with meals. Per sliding scale    . ipratropium-albuterol (DUONEB) 0.5-2.5 (3) MG/3ML SOLN Take 3 mLs by nebulization every 6 (six) hours as needed (for shortness of breath).     . isosorbide mononitrate (IMDUR) 30 MG 24 hr tablet Take 1 tablet (30 mg total) by mouth daily. 30 tablet 0  . lactulose (CHRONULAC) 10 GM/15ML solution Take 45 mLs (30 g total) by mouth 2 (two) times daily. 240 mL 0  . Omega-3 Fatty Acids (OMEGA 3 PO) Take 520 mg by mouth 2 (two) times daily.    . ondansetron (ZOFRAN-ODT) 8 MG disintegrating tablet Take 8 mg by mouth daily as needed for nausea or vomiting.     Marland Kitchen oxyCODONE (OXY IR/ROXICODONE) 5 MG immediate  release tablet Take 5 mg by mouth every 6 (six) hours as needed for severe pain.    Marland Kitchen QUEtiapine (SEROQUEL) 25 MG tablet Take 25 mg by mouth 2 (two) times daily.    . sertraline (ZOLOFT) 50 MG tablet Take 100 mg by mouth 2 (two) times daily.    . sodium chloride (OCEAN) 0.65 % SOLN nasal spray Place 1 spray into  both nostrils as needed for congestion. 1 Bottle 0  . torsemide (DEMADEX) 20 MG tablet Take 2 tablets (40 mg total) by mouth daily. 60 tablet 0  . traZODone (DESYREL) 100 MG tablet Take 1 tablet (100 mg total) by mouth at bedtime. 30 tablet 0  . Vitamin D, Ergocalciferol, (DRISDOL) 50000 units CAPS capsule Take 50,000 Units by mouth every 7 (seven) days.     No current facility-administered medications for this encounter.     Vitals:   09/14/17 1413  BP: (!) 140/58  Pulse: (!) 49  SpO2: 96%  Weight: 137 lb 3.2 oz (62.2 kg)   Wt Readings from Last 3 Encounters:  09/14/17 137 lb 3.2 oz (62.2 kg)  09/11/17 141 lb 6 oz (64.1 kg)  08/17/17 149 lb 6.4 oz (67.8 kg)     PHYSICAL EXAM:   General: Elderly appearing. No resp difficulty. In WC HEENT: Normal Neck: Supple. JVP 5-6. Carotids 2+ bilat; no bruits. No thyromegaly or nodule noted. Cor: PMI nondisplaced. RRR, No M/G/R noted Lungs: CTAB, normal effort. Abdomen: Soft, non-tender, non-distended, no HSM. No bruits or masses. +BS  Extremities: No cyanosis, clubbing, or rash. R and LLE no edema.  Neuro: Alert & orientedx3, cranial nerves grossly intact. moves all 4 extremities w/o difficulty. Affect pleasant   ASSESSMENT & PLAN: 1. Chronic diastolic CHF: Echo 6/06: EF 65-70%, mildly dilated RV. 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 [place] 06/12/17, however, showed pulmonary venous hypertension and elevated filling pressures with PVR about 2 WU.   - NYHA III, confounded by immobility - Volume status looks OK on exam.   - Continue torsemide 40 mg  daily. Creatinine 3.46 09/11/17. - Not candidate for CardioMems with CKD IV and consideration for HD.  - Wear graded compression stockings.  - Reinforced fluid restriction to < 2 L daily, sodium restriction to less than 2000 mg daily, and the importance of daily weights.   2. Pulmonary HTN: Pulmonary venous hypertension based on most recent RHC.  - Continue diuresis. No change.  - No role for selective pulmonary vasodilators at this point.  3. AKI on CKD stage 4 - Creatinine 3.46 09/11/17. - Sees Dr. Justin Mend.  - She has had vein mapping.  4. HTN:  - Meds as above. Choices limited with CKD IV.  5. Cirrhosis: Negative workup for viral and autoimmune hepatitis.  She does not drink ETOH.   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  - Stable. No confusion.  - Follows with Dr. Ardis Hughs.  6. Depression:  - Continue Zoloft. Per PCP.  7. Sinus bradycardia - EKG today with rates in 40s.  - Decrease coreg to 12.5 mg BID.   Looking good overall. OK with BP in 130-140 ranged with CKD IV-V. Sees Dr. Justin Mend. Recent labs with relatively stable Cr.   Shirley Friar, PA-C  09/14/2017   Greater than 50% of the 25 minute visit was spent in counseling/coordination of care regarding disease state education, salt/fluid restriction, sliding scale diuretics, and medication compliance.

## 2017-09-14 ENCOUNTER — Other Ambulatory Visit: Payer: Self-pay

## 2017-09-14 ENCOUNTER — Ambulatory Visit (HOSPITAL_COMMUNITY)
Admission: RE | Admit: 2017-09-14 | Discharge: 2017-09-14 | Disposition: A | Discharge status: Home / Self Care | Payer: Medicare HMO | Source: Ambulatory Visit | Attending: Internal Medicine | Admitting: Internal Medicine

## 2017-09-14 ENCOUNTER — Encounter (HOSPITAL_COMMUNITY): Payer: Self-pay

## 2017-09-14 VITALS — BP 140/58 | HR 49 | Wt 137.2 lb

## 2017-09-14 DIAGNOSIS — N184 Chronic kidney disease, stage 4 (severe): Secondary | ICD-10-CM | POA: Diagnosis not present

## 2017-09-14 DIAGNOSIS — E1122 Type 2 diabetes mellitus with diabetic chronic kidney disease: Secondary | ICD-10-CM | POA: Diagnosis not present

## 2017-09-14 DIAGNOSIS — N179 Acute kidney failure, unspecified: Secondary | ICD-10-CM | POA: Insufficient documentation

## 2017-09-14 DIAGNOSIS — M069 Rheumatoid arthritis, unspecified: Secondary | ICD-10-CM | POA: Insufficient documentation

## 2017-09-14 DIAGNOSIS — I130 Hypertensive heart and chronic kidney disease with heart failure and stage 1 through stage 4 chronic kidney disease, or unspecified chronic kidney disease: Secondary | ICD-10-CM | POA: Insufficient documentation

## 2017-09-14 DIAGNOSIS — R001 Bradycardia, unspecified: Secondary | ICD-10-CM | POA: Diagnosis not present

## 2017-09-14 DIAGNOSIS — K744 Secondary biliary cirrhosis: Secondary | ICD-10-CM | POA: Diagnosis not present

## 2017-09-14 DIAGNOSIS — I10 Essential (primary) hypertension: Secondary | ICD-10-CM

## 2017-09-14 DIAGNOSIS — K7460 Unspecified cirrhosis of liver: Secondary | ICD-10-CM | POA: Diagnosis not present

## 2017-09-14 DIAGNOSIS — F329 Major depressive disorder, single episode, unspecified: Secondary | ICD-10-CM | POA: Diagnosis not present

## 2017-09-14 DIAGNOSIS — I50810 Right heart failure, unspecified: Secondary | ICD-10-CM | POA: Diagnosis not present

## 2017-09-14 DIAGNOSIS — I2729 Other secondary pulmonary hypertension: Secondary | ICD-10-CM | POA: Diagnosis not present

## 2017-09-14 DIAGNOSIS — Z79899 Other long term (current) drug therapy: Secondary | ICD-10-CM | POA: Diagnosis not present

## 2017-09-14 DIAGNOSIS — Z794 Long term (current) use of insulin: Secondary | ICD-10-CM | POA: Insufficient documentation

## 2017-09-14 DIAGNOSIS — I5032 Chronic diastolic (congestive) heart failure: Secondary | ICD-10-CM | POA: Diagnosis present

## 2017-09-14 DIAGNOSIS — Z7982 Long term (current) use of aspirin: Secondary | ICD-10-CM | POA: Insufficient documentation

## 2017-09-14 DIAGNOSIS — I272 Pulmonary hypertension, unspecified: Secondary | ICD-10-CM | POA: Diagnosis not present

## 2017-09-14 MED ORDER — CARVEDILOL 25 MG PO TABS: 12.5000 mg | ORAL_TABLET | Freq: Two times a day (BID) | ORAL | 3 refills | Status: DC

## 2017-09-14 NOTE — Patient Instructions (Addendum)
Follow up as scheduled.   DECREASE Carvedilol to 12.5 mg (0.5 Tablet) Twice Daily.

## 2017-09-19 ENCOUNTER — Telehealth: Payer: Self-pay | Admitting: Gastroenterology

## 2017-09-19 NOTE — Telephone Encounter (Signed)
The pt needs to have twin rix injections and would like to have closer to home.  Left message on machine to call back

## 2017-09-19 NOTE — Telephone Encounter (Signed)
The pt's daughter was advised that she can have the injections at her convenience.  I have faxed the order to the PCP.

## 2017-10-03 ENCOUNTER — Other Ambulatory Visit (HOSPITAL_COMMUNITY): Payer: Self-pay | Admitting: Cardiology

## 2017-10-20 ENCOUNTER — Ambulatory Visit (HOSPITAL_COMMUNITY)
Admission: RE | Admit: 2017-10-20 | Discharge: 2017-10-20 | Disposition: A | Discharge status: Home / Self Care | Payer: Medicare HMO | Source: Ambulatory Visit | Attending: Cardiology | Admitting: Cardiology

## 2017-10-20 ENCOUNTER — Encounter (HOSPITAL_COMMUNITY): Payer: Self-pay | Admitting: Cardiology

## 2017-10-20 VITALS — BP 142/60 | HR 57 | Wt 138.0 lb

## 2017-10-20 DIAGNOSIS — N179 Acute kidney failure, unspecified: Secondary | ICD-10-CM | POA: Insufficient documentation

## 2017-10-20 DIAGNOSIS — I50810 Right heart failure, unspecified: Secondary | ICD-10-CM

## 2017-10-20 DIAGNOSIS — I5032 Chronic diastolic (congestive) heart failure: Secondary | ICD-10-CM

## 2017-10-20 DIAGNOSIS — I2729 Other secondary pulmonary hypertension: Secondary | ICD-10-CM | POA: Diagnosis not present

## 2017-10-20 DIAGNOSIS — F329 Major depressive disorder, single episode, unspecified: Secondary | ICD-10-CM | POA: Insufficient documentation

## 2017-10-20 DIAGNOSIS — I10 Essential (primary) hypertension: Secondary | ICD-10-CM

## 2017-10-20 DIAGNOSIS — Z8249 Family history of ischemic heart disease and other diseases of the circulatory system: Secondary | ICD-10-CM | POA: Diagnosis not present

## 2017-10-20 DIAGNOSIS — R001 Bradycardia, unspecified: Secondary | ICD-10-CM | POA: Insufficient documentation

## 2017-10-20 DIAGNOSIS — E1122 Type 2 diabetes mellitus with diabetic chronic kidney disease: Secondary | ICD-10-CM | POA: Diagnosis not present

## 2017-10-20 DIAGNOSIS — Z79899 Other long term (current) drug therapy: Secondary | ICD-10-CM | POA: Insufficient documentation

## 2017-10-20 DIAGNOSIS — Z7982 Long term (current) use of aspirin: Secondary | ICD-10-CM | POA: Diagnosis not present

## 2017-10-20 DIAGNOSIS — N184 Chronic kidney disease, stage 4 (severe): Secondary | ICD-10-CM | POA: Insufficient documentation

## 2017-10-20 DIAGNOSIS — I5033 Acute on chronic diastolic (congestive) heart failure: Secondary | ICD-10-CM | POA: Insufficient documentation

## 2017-10-20 DIAGNOSIS — I272 Pulmonary hypertension, unspecified: Secondary | ICD-10-CM

## 2017-10-20 DIAGNOSIS — I130 Hypertensive heart and chronic kidney disease with heart failure and stage 1 through stage 4 chronic kidney disease, or unspecified chronic kidney disease: Secondary | ICD-10-CM | POA: Diagnosis not present

## 2017-10-20 DIAGNOSIS — Z9889 Other specified postprocedural states: Secondary | ICD-10-CM | POA: Insufficient documentation

## 2017-10-20 DIAGNOSIS — K7460 Unspecified cirrhosis of liver: Secondary | ICD-10-CM | POA: Insufficient documentation

## 2017-10-20 DIAGNOSIS — Z794 Long term (current) use of insulin: Secondary | ICD-10-CM | POA: Insufficient documentation

## 2017-10-20 DIAGNOSIS — M069 Rheumatoid arthritis, unspecified: Secondary | ICD-10-CM | POA: Insufficient documentation

## 2017-10-20 DIAGNOSIS — K744 Secondary biliary cirrhosis: Secondary | ICD-10-CM | POA: Diagnosis not present

## 2017-10-20 LAB — BASIC METABOLIC PANEL
ANION GAP: 11 (ref 5–15)
BUN: 33 mg/dL — AB (ref 6–20)
CALCIUM: 9.3 mg/dL (ref 8.9–10.3)
CO2: 26 mmol/L (ref 22–32)
Chloride: 102 mmol/L (ref 101–111)
Creatinine, Ser: 2.47 mg/dL — ABNORMAL HIGH (ref 0.44–1.00)
GFR calc Af Amer: 21 mL/min — ABNORMAL LOW (ref >60)
GFR, EST NON AFRICAN AMERICAN: 18 mL/min — AB (ref >60)
Glucose, Bld: 220 mg/dL — ABNORMAL HIGH (ref 65–99)
POTASSIUM: 4.4 mmol/L (ref 3.5–5.1)
SODIUM: 139 mmol/L (ref 135–145)

## 2017-10-20 LAB — CBC
HEMATOCRIT: 29.4 % — AB (ref 36.0–46.0)
HEMOGLOBIN: 9.6 g/dL — AB (ref 12.0–15.0)
MCH: 29 pg (ref 26.0–34.0)
MCHC: 32.7 g/dL (ref 30.0–36.0)
MCV: 88.8 fL (ref 78.0–100.0)
Platelets: 88 10*3/uL — ABNORMAL LOW (ref 150–400)
RBC: 3.31 MIL/uL — ABNORMAL LOW (ref 3.87–5.11)
RDW: 14.5 % (ref 11.5–15.5)
WBC: 4.2 10*3/uL (ref 4.0–10.5)

## 2017-10-20 NOTE — Patient Instructions (Signed)
Labs drawn today (if we do not call you, then your lab work was stable)   Your physician recommends that you schedule a follow-up appointment in: 4 months with Dr. Aundra Dubin  Please Call an Schedule Appointment ( call August)

## 2017-10-20 NOTE — Progress Notes (Addendum)
Advanced Heart Failure Clinic Note   PCP: Dr Elvina Mattes ([name], [place]) HF Cardiology: Dr Aundra Dubin   HPI: 72 y.o. with history of CKD stage IV, diastolic CHF, HTN, cirrhosis, DM, depression, and pulmonary hypertension presents for followup of CHF.   Admitted 1/26-06/19/16 with acute on chronic diastolic HF. Echo (1/18) showed EF 65-70% with grade 1 DD and mildly dilated RV. RHC showed pulmonary venous hypertension. She was diuresed with IV lasix. Coreg and hydralazine were increased. GI consulted for abdominal pain with N/V. CT abdomen showed hepatic cirrhosis. She started lactulose for elevated ammonia. She had some CP while vomiting with troponin peak of 1.08, which was thought to be demand ischemia. No cath with CKD. No further CP. Discharge weight 158 pounds.   Admitted 07/2017 with volume overload and AKI. Renal followed along. Had initially recommended Cardiomems but with worsening renal function decided low yield. Creatinine 2.29 on discharge. Lasix change to torsemide.   She presents today for regular follow up with Echo. She is feeling great overall. Weight stable at home. Doesn't need to see Dr. Justin Mend for "6 months". Having good UOP with clear/yellow color. She denies any DOE. No lightheadedness or dizziness. She is working in her rock garden in her yard. Able to walk much further. She occasional has trace peripheral ankle edema at the end of a long day, otherwise no significant edema. Taking all medications as directed. BP runs 120s at home.   EKG 09/14/17 Sinus bradycardia 46 bpm. (Coreg then decreased)  Labs (2/19): K 5.1, creatinine 2.51, BNP 793 Labs 07/14/17 K 4.2, Creatinine 2.99, Ammonia 37. Labs (4/19): K 4.9, creatinine 3.46  PMH: 1. Chronic diastolic CHF:  - RHC (8/31): mean RA 8, PA 78/25 mean 45, mean PCWP 18, CI 3.6, PVR 4.6 WU - Echo (9/18): EF 60-65%, normal RV systolic function, PASP 58, mild TR and MR.  - Echo (1/19): EF 65-70%, mild aortic stenosis, mildly dilated RV  normal systolic function, PASP 60 mmHg.  - RHC (1/19): mean RA 13, PA 61/27, mean PCWP 20, CI 5.87 Fick (PVR 1.9 WU), CI 5.34 Thermo (PVR 2 WU).  2. Pulmonary hypertension: RHC in 1/19 suggested pulmonary venous hypertension.  - V/Q scan (1/19): No chronic or acute PE 3. Cirrhosis: Abdominal US (1/19): Liver appears cirrhotic. No gallstones.  - HBV/HCV workup negative.  - ANA negative, ASMA negative.  4. Depression 5. CKD stage 3.  6. Diabetes mellitus 7. HTN 8. Rheumatoid arthritis  Review of systems complete and found to be negative unless listed in HPI.    Social History   Socioeconomic History  . Marital status: Widowed    Spouse name: Not on file  . Number of children: Not on file  . Years of education: Not on file  . Highest education level: Not on file  Occupational History  . Not on file  Social Needs  . Financial resource strain: Not on file  . Food insecurity:    Worry: Not on file    Inability: Not on file  . Transportation needs:    Medical: Not on file    Non-medical: Not on file  Tobacco Use  . Smoking status: Never Smoker  . Smokeless tobacco: Never Used  Substance and Sexual Activity  . Alcohol use: No    Alcohol/week: 0.0 oz  . Drug use: No  . Sexual activity: Not on file  Lifestyle  . Physical activity:    Days per week: Not on file    Minutes per session: Not  on file  . Stress: Not on file  Relationships  . Social connections:    Talks on phone: Not on file    Gets together: Not on file    Attends religious service: Not on file    Active member of club or organization: Not on file    Attends meetings of clubs or organizations: Not on file    Relationship status: Not on file  . Intimate partner violence:    Fear of current or ex partner: Not on file    Emotionally abused: Not on file    Physically abused: Not on file    Forced sexual activity: Not on file  Other Topics Concern  . Not on file  Social History Narrative  . Not on file    Family History  Problem Relation Age of Onset  . Heart failure Mother   . Liver cancer Father   . Lung cancer Father   . Cancer Sister   . Heart murmur Brother     Current Outpatient Medications  Medication Sig Dispense Refill  . amLODipine (NORVASC) 5 MG tablet Take 5 mg by mouth 2 (two) times daily.     Marland Kitchen aspirin EC 81 MG tablet Take 81 mg by mouth daily.    Marland Kitchen atorvastatin (LIPITOR) 80 MG tablet Take 1 tablet (80 mg total) by mouth daily at 6 PM. 30 tablet 0  . calcitRIOL (ROCALTROL) 0.5 MCG capsule Take 0.5 mcg by mouth every Monday, Wednesday, and Friday.     . carvedilol (COREG) 25 MG tablet Take 0.5 tablets (12.5 mg total) by mouth 2 (two) times daily with a meal. 90 tablet 3  . diclofenac sodium (VOLTAREN) 1 % GEL Apply 2 g topically 4 (four) times daily as needed (Pain). 1 Tube 0  . docusate sodium (COLACE) 100 MG capsule Take 1 capsule (100 mg total) by mouth 2 (two) times daily. (Patient taking differently: Take 200 mg by mouth daily as needed for mild constipation. ) 10 capsule 0  . feeding supplement, ENSURE ENLIVE, (ENSURE ENLIVE) LIQD Take 237 mLs by mouth 3 (three) times daily between meals. 237 mL 12  . ferrous sulfate 325 (65 FE) MG tablet Take 325 mg by mouth 2 (two) times daily with a meal.     . hydrALAZINE (APRESOLINE) 100 MG tablet Take 1 tablet (100 mg total) by mouth 3 (three) times daily. 90 tablet 0  . insulin aspart (NOVOLOG FLEXPEN) 100 UNIT/ML FlexPen Inject into the skin 3 (three) times daily with meals. Per sliding scale    . ipratropium-albuterol (DUONEB) 0.5-2.5 (3) MG/3ML SOLN Take 3 mLs by nebulization every 6 (six) hours as needed (for shortness of breath).     . isosorbide mononitrate (IMDUR) 30 MG 24 hr tablet Take 1 tablet (30 mg total) by mouth daily. 30 tablet 0  . lactulose (CHRONULAC) 10 GM/15ML solution Take 45 mLs (30 g total) by mouth 2 (two) times daily. 240 mL 0  . Omega-3 Fatty Acids (OMEGA 3 PO) Take 520 mg by mouth 2 (two) times daily.     . ondansetron (ZOFRAN-ODT) 8 MG disintegrating tablet Take 8 mg by mouth daily as needed for nausea or vomiting.     Marland Kitchen oxyCODONE (OXY IR/ROXICODONE) 5 MG immediate release tablet Take 5 mg by mouth every 6 (six) hours as needed for severe pain.    Marland Kitchen QUEtiapine (SEROQUEL) 25 MG tablet Take 25 mg by mouth 2 (two) times daily.    . sertraline (ZOLOFT) 50 MG  tablet Take 100 mg by mouth 2 (two) times daily.    . sodium chloride (OCEAN) 0.65 % SOLN nasal spray Place 1 spray into both nostrils as needed for congestion. 1 Bottle 0  . torsemide (DEMADEX) 20 MG tablet TAKE 2 TABLETS BY MOUTH EVERY DAY 60 tablet 3  . traZODone (DESYREL) 100 MG tablet Take 1 tablet (100 mg total) by mouth at bedtime. 30 tablet 0  . Vitamin D, Ergocalciferol, (DRISDOL) 50000 units CAPS capsule Take 50,000 Units by mouth every 7 (seven) days.     No current facility-administered medications for this encounter.    Vitals:   10/20/17 1446  BP: (!) 142/60  Pulse: (!) 57  SpO2: 93%  Weight: 138 lb (62.6 kg)   Wt Readings from Last 3 Encounters:  10/20/17 138 lb (62.6 kg)  09/14/17 137 lb 3.2 oz (62.2 kg)  09/11/17 141 lb 6 oz (64.1 kg)    PHYSICAL EXAM:   General: Elderly appearing. No resp difficulty. Ambulated into clinic without difficult.  HEENT: Normal Neck: Supple. JVP 5-6. Carotids 2+ bilat; no bruits. No thyromegaly or nodule noted. Cor: PMI nondisplaced. RRR, No M/G/R noted Lungs: CTAB, normal effort. Abdomen: Soft, non-tender, non-distended, no HSM. No bruits or masses. +BS  Extremities: No cyanosis, clubbing, or rash. R and LLE no edema.  Neuro: Alert & orientedx3, cranial nerves grossly intact. moves all 4 extremities w/o difficulty. Affect pleasant   ASSESSMENT & PLAN: 1. Chronic diastolic CHF: Echo 0/27: EF 65-70%, mildly dilated RV. [place] in 1/18 with severe pulmonary hypertension, likely mixed pulmonary venous and pulmonary arterial HTN with PVR 4.6 WU. [place] 06/12/17, however, showed pulmonary venous  hypertension and elevated filling pressures with PVR about 2 WU.   - NYHA II currently.  - Volume status stable on exam.   - Continue torsemide 40 mg daily. Creatinine 3.46 09/11/17. BMET today.  - Not candidate for CardioMems with CKD IV and consideration for HD.  - Wear graded compression stockings.  - Reinforced fluid restriction to < 2 L daily, sodium restriction to less than 2000 mg daily, and the importance of daily weights.   2. Pulmonary HTN: Pulmonary venous hypertension based on most recent RHC.  - Continue diuresis. No change.  - No role for selective pulmonary vasodilators at this point.  3. AKI on CKD stage 4 - Creatinine 3.46 09/11/17.Follows with Dr. Justin Mend.  - She has had vein mapping.  4. HTN:  - Meds as above. Choices limited with CKD IV.  5. Cirrhosis: Negative workup for viral and autoimmune hepatitis.  She does not drink ETOH.   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  - Stable. No confusion.  - Follows with Dr. Ardis Hughs.  6. Depression:  - Continue Zoloft. Per PCP.  7. Sinus bradycardia - Improved with decreased dose of coreg. - Continue coreg 12.5 mg BID.   Labs today. RTC 3-4 months.   Shirley Friar, PA-C  10/20/2017   Patient seen with PA, agree with the above note.  Symptomatically, she has been doing well.  NYHA class II.  Creatinine has remained high, most recently 3.46.  She has had good UOP.  - Continue current torsemide 40 mg daily. BMET today.  - Continue current BP control.  - Followup in 4 months.   Loralie Champagne 10/22/2017

## 2017-10-30 ENCOUNTER — Other Ambulatory Visit (HOSPITAL_COMMUNITY): Payer: Self-pay | Admitting: Cardiology

## 2017-12-07 ENCOUNTER — Other Ambulatory Visit (HOSPITAL_COMMUNITY): Payer: Self-pay | Admitting: Cardiology

## 2017-12-18 ENCOUNTER — Other Ambulatory Visit: Payer: Self-pay | Admitting: Gastroenterology

## 2017-12-18 ENCOUNTER — Telehealth: Payer: Self-pay | Admitting: Gastroenterology

## 2017-12-18 DIAGNOSIS — K7460 Unspecified cirrhosis of liver: Secondary | ICD-10-CM

## 2017-12-18 NOTE — Telephone Encounter (Signed)
I spoke to the Son, appointment and labs have been scheduled for next week.

## 2017-12-18 NOTE — Telephone Encounter (Signed)
Try the brothers number he called today [place]

## 2017-12-18 NOTE — Progress Notes (Signed)
I spoke to patients son Vernard Gambles to get patient set up for an abdominal US for August per Dr Ardis Hughs 4/19 office visit orders. Appointment has been made for 12/28/17 at [facility]. Instructions have been mailed to her Son. Patient will come by the lab after [place] to have AFP tumor marker done.

## 2017-12-18 NOTE — Telephone Encounter (Signed)
I have called her twice last week and left a message both times.

## 2017-12-18 NOTE — Telephone Encounter (Signed)
Thank you :)

## 2017-12-18 NOTE — Telephone Encounter (Signed)
Stacy Smith have you tried to reach the pt regarding 6 month follow up US?

## 2017-12-28 ENCOUNTER — Ambulatory Visit (HOSPITAL_COMMUNITY)
Admission: RE | Admit: 2017-12-28 | Discharge: 2017-12-28 | Disposition: A | Discharge status: Home / Self Care | Payer: Medicare HMO | Source: Ambulatory Visit | Attending: Gastroenterology | Admitting: Gastroenterology

## 2017-12-28 ENCOUNTER — Other Ambulatory Visit: Payer: Medicare HMO

## 2017-12-28 DIAGNOSIS — R161 Splenomegaly, not elsewhere classified: Secondary | ICD-10-CM | POA: Insufficient documentation

## 2017-12-28 DIAGNOSIS — K7460 Unspecified cirrhosis of liver: Secondary | ICD-10-CM | POA: Insufficient documentation

## 2017-12-29 LAB — AFP TUMOR MARKER: AFP-Tumor Marker: 1.9 ng/mL

## 2018-01-01 ENCOUNTER — Other Ambulatory Visit: Payer: Self-pay

## 2018-01-01 DIAGNOSIS — K7460 Unspecified cirrhosis of liver: Secondary | ICD-10-CM

## 2018-03-16 ENCOUNTER — Ambulatory Visit: Payer: Self-pay | Admitting: Gastroenterology

## 2018-03-20 ENCOUNTER — Ambulatory Visit (HOSPITAL_COMMUNITY)
Admission: RE | Admit: 2018-03-20 | Discharge: 2018-03-20 | Disposition: A | Discharge status: Home / Self Care | Payer: Medicare HMO | Source: Ambulatory Visit | Attending: Cardiology | Admitting: Cardiology

## 2018-03-20 ENCOUNTER — Encounter (HOSPITAL_COMMUNITY): Payer: Self-pay | Admitting: Cardiology

## 2018-03-20 VITALS — BP 137/43 | HR 59 | Wt 141.8 lb

## 2018-03-20 DIAGNOSIS — K7460 Unspecified cirrhosis of liver: Secondary | ICD-10-CM | POA: Insufficient documentation

## 2018-03-20 DIAGNOSIS — Z79899 Other long term (current) drug therapy: Secondary | ICD-10-CM | POA: Diagnosis not present

## 2018-03-20 DIAGNOSIS — N184 Chronic kidney disease, stage 4 (severe): Secondary | ICD-10-CM

## 2018-03-20 DIAGNOSIS — I5032 Chronic diastolic (congestive) heart failure: Secondary | ICD-10-CM | POA: Diagnosis not present

## 2018-03-20 DIAGNOSIS — Z7982 Long term (current) use of aspirin: Secondary | ICD-10-CM | POA: Insufficient documentation

## 2018-03-20 DIAGNOSIS — E1122 Type 2 diabetes mellitus with diabetic chronic kidney disease: Secondary | ICD-10-CM | POA: Insufficient documentation

## 2018-03-20 DIAGNOSIS — R001 Bradycardia, unspecified: Secondary | ICD-10-CM | POA: Diagnosis not present

## 2018-03-20 DIAGNOSIS — I130 Hypertensive heart and chronic kidney disease with heart failure and stage 1 through stage 4 chronic kidney disease, or unspecified chronic kidney disease: Secondary | ICD-10-CM | POA: Diagnosis not present

## 2018-03-20 DIAGNOSIS — I5033 Acute on chronic diastolic (congestive) heart failure: Secondary | ICD-10-CM | POA: Insufficient documentation

## 2018-03-20 DIAGNOSIS — F329 Major depressive disorder, single episode, unspecified: Secondary | ICD-10-CM | POA: Diagnosis not present

## 2018-03-20 DIAGNOSIS — I272 Pulmonary hypertension, unspecified: Secondary | ICD-10-CM | POA: Insufficient documentation

## 2018-03-20 DIAGNOSIS — Z794 Long term (current) use of insulin: Secondary | ICD-10-CM | POA: Insufficient documentation

## 2018-03-20 DIAGNOSIS — M069 Rheumatoid arthritis, unspecified: Secondary | ICD-10-CM | POA: Diagnosis not present

## 2018-03-20 LAB — CBC
HCT: 29.4 % — ABNORMAL LOW (ref 36.0–46.0)
Hemoglobin: 9.5 g/dL — ABNORMAL LOW (ref 12.0–15.0)
MCH: 28.6 pg (ref 26.0–34.0)
MCHC: 32.3 g/dL (ref 30.0–36.0)
MCV: 88.6 fL (ref 80.0–100.0)
PLATELETS: 124 10*3/uL — AB (ref 150–400)
RBC: 3.32 MIL/uL — AB (ref 3.87–5.11)
RDW: 13.2 % (ref 11.5–15.5)
WBC: 4.9 10*3/uL (ref 4.0–10.5)
nRBC: 0 % (ref 0.0–0.2)

## 2018-03-20 LAB — BASIC METABOLIC PANEL
ANION GAP: 9 (ref 5–15)
BUN: 41 mg/dL — ABNORMAL HIGH (ref 8–23)
CALCIUM: 9 mg/dL (ref 8.9–10.3)
CO2: 24 mmol/L (ref 22–32)
Chloride: 102 mmol/L (ref 98–111)
Creatinine, Ser: 2.95 mg/dL — ABNORMAL HIGH (ref 0.44–1.00)
GFR calc non Af Amer: 15 mL/min — ABNORMAL LOW (ref >60)
GFR, EST AFRICAN AMERICAN: 17 mL/min — AB (ref >60)
Glucose, Bld: 134 mg/dL — ABNORMAL HIGH (ref 70–99)
Potassium: 4.1 mmol/L (ref 3.5–5.1)
Sodium: 135 mmol/L (ref 135–145)

## 2018-03-20 NOTE — Patient Instructions (Signed)
Labs done today  Follow up with Dr. Aundra Dubin in 3 months

## 2018-03-21 NOTE — Progress Notes (Signed)
Advanced Heart Failure Clinic Note   PCP: Dr Elvina Mattes ([name], [place]) HF Cardiology: Dr Aundra Dubin   HPI: 72 y.o. with history of CKD stage IV, diastolic CHF, HTN, cirrhosis, DM, depression, and pulmonary hypertension presents for followup of CHF.   Admitted 1/26-06/19/16 with acute on chronic diastolic HF. Echo (1/18) showed EF 65-70% with grade 1 DD and mildly dilated RV. RHC showed pulmonary venous hypertension. She was diuresed with IV lasix. Coreg and hydralazine were increased. GI consulted for abdominal pain with N/V. CT abdomen showed hepatic cirrhosis. She started lactulose for elevated ammonia. She had some CP while vomiting with troponin peak of 1.08, which was thought to be demand ischemia. No cath with CKD. No further CP. Discharge weight 158 pounds.   Admitted 07/2017 with volume overload and AKI. Renal followed along. Had initially recommended Cardiomems but with worsening renal function decided low yield. Creatinine 2.29 on discharge. Lasix change to torsemide.   She is doing reasonably well today.  She is short of breath with moderate activity, which is stable. No problems walking on flat ground.  Rare atypical chest pain.  No orthopnea/PND. Occasionally feels mild palpitations. No BRBPR/melena.  Labs (2/19): K 5.1, creatinine 2.51, BNP 793 Labs 07/14/17 K 4.2, creatinine 2.99, Ammonia 37. Labs (4/19): K 4.9, creatinine 3.46 Labs (7/19): K 4.1, creatinine 2.3, LDL 83, HDL 39, LFTs normal, hgb 9.3  ECG (personally reviewed): NSR, poor RWP  PMH: 1. Chronic diastolic CHF:  - RHC (8/18): mean RA 8, PA 78/25 mean 45, mean PCWP 18, CI 3.6, PVR 4.6 WU - Echo (9/18): EF 60-65%, normal RV systolic function, PASP 58, mild TR and MR.  - Echo (1/19): EF 65-70%, mild aortic stenosis, mildly dilated RV normal systolic function, PASP 60 mmHg.  - RHC (1/19): mean RA 13, PA 61/27, mean PCWP 20, CI 5.87 Fick (PVR 1.9 WU), CI 5.34 Thermo (PVR 2 WU).  2. Pulmonary hypertension: RHC in 1/19  suggested pulmonary venous hypertension.  - V/Q scan (1/19): No chronic or acute PE 3. Cirrhosis: Abdominal US (1/19): Liver appears cirrhotic. No gallstones.  - HBV/HCV workup negative.  - ANA negative, ASMA negative.  4. Depression 5. CKD stage 3.  6. Diabetes mellitus 7. HTN 8. Rheumatoid arthritis  Review of systems complete and found to be negative unless listed in HPI.    Social History   Socioeconomic History  . Marital status: Widowed    Spouse name: Not on file  . Number of children: Not on file  . Years of education: Not on file  . Highest education level: Not on file  Occupational History  . Not on file  Social Needs  . Financial resource strain: Not on file  . Food insecurity:    Worry: Not on file    Inability: Not on file  . Transportation needs:    Medical: Not on file    Non-medical: Not on file  Tobacco Use  . Smoking status: Never Smoker  . Smokeless tobacco: Never Used  Substance and Sexual Activity  . Alcohol use: No    Alcohol/week: 0.0 standard drinks  . Drug use: No  . Sexual activity: Not on file  Lifestyle  . Physical activity:    Days per week: Not on file    Minutes per session: Not on file  . Stress: Not on file  Relationships  . Social connections:    Talks on phone: Not on file    Gets together: Not on file    Attends  religious service: Not on file    Active member of club or organization: Not on file    Attends meetings of clubs or organizations: Not on file    Relationship status: Not on file  . Intimate partner violence:    Fear of current or ex partner: Not on file    Emotionally abused: Not on file    Physically abused: Not on file    Forced sexual activity: Not on file  Other Topics Concern  . Not on file  Social History Narrative  . Not on file   Family History  Problem Relation Age of Onset  . Heart failure Mother   . Liver cancer Father   . Lung cancer Father   . Cancer Sister   . Heart murmur Brother      Current Outpatient Medications  Medication Sig Dispense Refill  . amLODipine (NORVASC) 5 MG tablet Take 5 mg by mouth 2 (two) times daily.     Marland Kitchen aspirin EC 81 MG tablet Take 81 mg by mouth daily.    Marland Kitchen atorvastatin (LIPITOR) 80 MG tablet Take 1 tablet (80 mg total) by mouth daily at 6 PM. 30 tablet 0  . carvedilol (COREG) 25 MG tablet Take 0.5 tablets (12.5 mg total) by mouth 2 (two) times daily with a meal. 90 tablet 3  . diclofenac sodium (VOLTAREN) 1 % GEL Apply 2 g topically 4 (four) times daily as needed (Pain). 1 Tube 0  . docusate sodium (COLACE) 100 MG capsule Take 1 capsule (100 mg total) by mouth 2 (two) times daily. (Patient taking differently: Take 200 mg by mouth daily as needed for mild constipation. ) 10 capsule 0  . feeding supplement, ENSURE ENLIVE, (ENSURE ENLIVE) LIQD Take 237 mLs by mouth 3 (three) times daily between meals. 237 mL 12  . ferrous sulfate 325 (65 FE) MG tablet Take 325 mg by mouth 2 (two) times daily with a meal.     . hydrALAZINE (APRESOLINE) 100 MG tablet Take 1 tablet (100 mg total) by mouth 3 (three) times daily. 90 tablet 0  . insulin aspart (NOVOLOG FLEXPEN) 100 UNIT/ML FlexPen Inject into the skin 3 (three) times daily with meals. Per sliding scale    . ipratropium-albuterol (DUONEB) 0.5-2.5 (3) MG/3ML SOLN Take 3 mLs by nebulization every 6 (six) hours as needed (for shortness of breath).     . isosorbide mononitrate (IMDUR) 30 MG 24 hr tablet Take 1 tablet (30 mg total) by mouth daily. 30 tablet 0  . lactulose (CHRONULAC) 10 GM/15ML solution Take 45 mLs (30 g total) by mouth 2 (two) times daily. 240 mL 0  . Omega-3 Fatty Acids (OMEGA 3 PO) Take 520 mg by mouth 2 (two) times daily.    . ondansetron (ZOFRAN-ODT) 8 MG disintegrating tablet Take 8 mg by mouth daily as needed for nausea or vomiting.     Marland Kitchen oxyCODONE (OXY IR/ROXICODONE) 5 MG immediate release tablet Take 5 mg by mouth every 6 (six) hours as needed for severe pain.    Marland Kitchen QUEtiapine (SEROQUEL)  25 MG tablet Take 25 mg by mouth 2 (two) times daily.    . sertraline (ZOLOFT) 50 MG tablet Take 100 mg by mouth 2 (two) times daily.    . sodium chloride (OCEAN) 0.65 % SOLN nasal spray Place 1 spray into both nostrils as needed for congestion. 1 Bottle 0  . torsemide (DEMADEX) 20 MG tablet TAKE 2 TABLETS BY MOUTH EVERY DAY 60 tablet 3  . traZODone (  DESYREL) 100 MG tablet Take 1 tablet (100 mg total) by mouth at bedtime. 30 tablet 0  . Vitamin D, Ergocalciferol, (DRISDOL) 50000 units CAPS capsule Take 50,000 Units by mouth every 7 (seven) days.    . calcitRIOL (ROCALTROL) 0.5 MCG capsule Take 0.5 mcg by mouth every Monday, Wednesday, and Friday.      No current facility-administered medications for this encounter.    Vitals:   03/20/18 1359  BP: (!) 137/43  Pulse: (!) 59  SpO2: 98%  Weight: 64.3 kg (141 lb 12.8 oz)   Wt Readings from Last 3 Encounters:  03/20/18 64.3 kg (141 lb 12.8 oz)  10/20/17 62.6 kg (138 lb)  09/14/17 62.2 kg (137 lb 3.2 oz)    PHYSICAL EXAM:   General: NAD Neck: No JVD, no thyromegaly or thyroid nodule.  Lungs: Clear to auscultation bilaterally with normal respiratory effort. CV: Nondisplaced PMI.  Heart regular S1/S2, no S3/S4, 2/6 SEM RUSB.  1+ ankle edema.  No carotid bruit.  Normal pedal pulses.  Abdomen: Soft, nontender, no hepatosplenomegaly, no distention.  Skin: Intact without lesions or rashes.  Neurologic: Alert and oriented x 3.  Psych: Normal affect. Extremities: No clubbing or cyanosis.  HEENT: Normal.   ASSESSMENT & PLAN: 1. Chronic diastolic CHF: Echo 3/78 with EF 65-70%, mildly dilated RV. [place] in 1/18 with severe pulmonary hypertension, likely mixed pulmonary venous and pulmonary arterial HTN with PVR 4.6 WU. [place] 06/12/17, however, showed pulmonary venous hypertension and elevated filling pressures with PVR about 2 WU.  NYHA class II-III symptoms.  She is not volume overloaded on exam.  - Continue torsemide 40 mg daily.  BMET today.  - If  creatinine remains fairly stable around 2.5 or below, I think Cardiomems would be a reasonable consideration for her. We discussed this today.  2. Pulmonary HTN: Pulmonary venous hypertension based on most recent RHC. No role for selective pulmonary vasodilators at this point.  3. CKD stage 3-4: Most recent creatinine was lower at 2.3.   - BMET today.  - Follows with Dr .Justin Mend. 4. HTN: BP controlled.   5. Cirrhosis: Negative workup for viral and autoimmune hepatitis.  She does not drink ETOH.  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  - Follows with Dr. Ardis Hughs.  - She is on lactulose.  6. Depression:  - Continue Zoloft. Per PCP.  7. Sinus bradycardia: Improved with decreased dose of coreg. - Continue coreg 12.5 mg BID.   Followup in 3 months.   Loralie Champagne, MD  03/21/2018

## 2018-03-22 ENCOUNTER — Telehealth (HOSPITAL_COMMUNITY): Payer: Self-pay

## 2018-03-22 DIAGNOSIS — I5032 Chronic diastolic (congestive) heart failure: Secondary | ICD-10-CM

## 2018-03-22 NOTE — Telephone Encounter (Signed)
Opened in error

## 2018-04-10 ENCOUNTER — Encounter (HOSPITAL_COMMUNITY): Payer: Self-pay

## 2018-04-10 ENCOUNTER — Encounter (HOSPITAL_COMMUNITY)
Admission: RE | Admit: 2018-04-10 | Discharge: 2018-04-10 | Disposition: A | Discharge status: Home / Self Care | Payer: Medicare HMO | Source: Ambulatory Visit | Attending: Nephrology | Admitting: Nephrology

## 2018-04-10 DIAGNOSIS — N184 Chronic kidney disease, stage 4 (severe): Secondary | ICD-10-CM | POA: Insufficient documentation

## 2018-04-10 DIAGNOSIS — D631 Anemia in chronic kidney disease: Secondary | ICD-10-CM | POA: Diagnosis present

## 2018-04-10 LAB — POCT HEMOGLOBIN-HEMACUE: Hemoglobin: 8.2 g/dL — ABNORMAL LOW (ref 12.0–15.0)

## 2018-04-10 MED ORDER — EPOETIN ALFA-EPBX 10000 UNIT/ML IJ SOLN
10000.0000 [IU] | Freq: Once | INTRAMUSCULAR | Status: AC
Start: 2018-04-10 — End: 2018-04-10
  Administered 2018-04-10: 10000 [IU] via SUBCUTANEOUS
  Filled 2018-04-10: qty 1

## 2018-04-10 MED ORDER — SODIUM CHLORIDE 0.9 % IV SOLN
510.0000 mg | Freq: Once | INTRAVENOUS | Status: AC
  Administered 2018-04-10: 510 mg via INTRAVENOUS
  Filled 2018-04-10: qty 17

## 2018-04-10 MED ORDER — SODIUM CHLORIDE 0.9 % IV SOLN
Freq: Once | INTRAVENOUS | Status: AC
  Administered 2018-04-10: 250 mL via INTRAVENOUS

## 2018-04-11 ENCOUNTER — Other Ambulatory Visit: Payer: Self-pay

## 2018-04-11 DIAGNOSIS — N185 Chronic kidney disease, stage 5: Secondary | ICD-10-CM

## 2018-04-17 ENCOUNTER — Other Ambulatory Visit (INDEPENDENT_AMBULATORY_CARE_PROVIDER_SITE_OTHER): Payer: Medicare HMO

## 2018-04-17 ENCOUNTER — Encounter (HOSPITAL_COMMUNITY): Payer: Medicare HMO

## 2018-04-17 ENCOUNTER — Ambulatory Visit (INDEPENDENT_AMBULATORY_CARE_PROVIDER_SITE_OTHER): Payer: Medicare HMO | Admitting: Gastroenterology

## 2018-04-17 ENCOUNTER — Encounter: Payer: Self-pay | Admitting: Gastroenterology

## 2018-04-17 VITALS — BP 110/68 | HR 53 | Ht 60.0 in | Wt 132.6 lb

## 2018-04-17 DIAGNOSIS — K7460 Unspecified cirrhosis of liver: Secondary | ICD-10-CM

## 2018-04-17 LAB — CBC WITH DIFFERENTIAL/PLATELET
BASOS ABS: 0 10*3/uL (ref 0.0–0.1)
Basophils Relative: 0.5 % (ref 0.0–3.0)
EOS ABS: 0.2 10*3/uL (ref 0.0–0.7)
Eosinophils Relative: 3.4 % (ref 0.0–5.0)
HEMATOCRIT: 31 % — AB (ref 36.0–46.0)
HEMOGLOBIN: 10.4 g/dL — AB (ref 12.0–15.0)
LYMPHS PCT: 16.5 % (ref 12.0–46.0)
Lymphs Abs: 1.1 10*3/uL (ref 0.7–4.0)
MCHC: 33.7 g/dL (ref 30.0–36.0)
MCV: 90.9 fl (ref 78.0–100.0)
Monocytes Absolute: 0.3 10*3/uL (ref 0.1–1.0)
Monocytes Relative: 4.6 % (ref 3.0–12.0)
Neutro Abs: 4.8 10*3/uL (ref 1.4–7.7)
Neutrophils Relative %: 75 % (ref 43.0–77.0)
Platelets: 193 10*3/uL (ref 150.0–400.0)
RBC: 3.41 Mil/uL — AB (ref 3.87–5.11)
RDW: 16.3 % — ABNORMAL HIGH (ref 11.5–15.5)
WBC: 6.4 10*3/uL (ref 4.0–10.5)

## 2018-04-17 LAB — COMPREHENSIVE METABOLIC PANEL
ALBUMIN: 4.2 g/dL (ref 3.5–5.2)
ALT: 14 U/L (ref 0–35)
AST: 25 U/L (ref 0–37)
Alkaline Phosphatase: 102 U/L (ref 39–117)
BUN: 41 mg/dL — ABNORMAL HIGH (ref 6–23)
CO2: 24 mEq/L (ref 19–32)
Calcium: 9.3 mg/dL (ref 8.4–10.5)
Chloride: 98 mEq/L (ref 96–112)
Creatinine, Ser: 3.29 mg/dL — ABNORMAL HIGH (ref 0.40–1.20)
GFR: 14.62 mL/min — AB (ref >60.00)
GLUCOSE: 355 mg/dL — AB (ref 70–99)
POTASSIUM: 4 meq/L (ref 3.5–5.1)
Sodium: 134 mEq/L — ABNORMAL LOW (ref 135–145)
TOTAL PROTEIN: 7.6 g/dL (ref 6.0–8.3)
Total Bilirubin: 0.6 mg/dL (ref 0.2–1.2)

## 2018-04-17 LAB — PROTIME-INR
INR: 1.2 ratio — AB (ref 0.8–1.0)
Prothrombin Time: 14.1 s — ABNORMAL HIGH (ref 9.6–13.1)

## 2018-04-17 NOTE — Patient Instructions (Addendum)
Please return to see Dr. Ardis Hughs in 6 months.  You will have labs checked today in the basement lab.  Please head down after you check out with the front desk  (cbc, inr, cmet).  [place] and AFP level 06/2018 for hepatoma screening (can be done at [facility]).  It is important that you have a relatively low salt diet.  High salt diet can cause fluid to accumulate in your legs, abdomen and even around your lungs. You should try to avoid NSAID type over the counter pain medicines as best as possible. Tylenol is safe to take for 'routine' aches and pains, but never take more than 1/2 the dose suggested on the package instructions (never more than 2 grams per day). Avoid alcohol.  Thank you for entrusting me with your care and choosing [facility].  Dr Ardis Hughs

## 2018-04-17 NOTE — Progress Notes (Signed)
Review of pertinent gastrointestinal problems: 1. 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  Transaminases normal INR 1.2, total bilirubin 0.6  Most recent alpha-fetoprotein 12/2017 was normal  Liver imaging ultrasound January 2019 showed cirrhosis without mass lesions, CT scan February 2019 showed cirrhosis without liver masses. 8/2019US cirrhosis without mass lesions.  Upper endoscopy; not performed since she was already on Coreg  MELD 06/2017 labs: 21 (mainly due to Cr 3.9)  Not a liver transplant candidate given severe cardio/pulm disease and CKD  Not immune to hep A/B as of 08/2017; immunization series ordered however she elected to do these locally, in [place]..  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  She is here with her brother today  Lactulose syrup twice daily and while on this regimen her mild encephalopathy is much improved  Her weight is down 9 pounds since that last visit several months ago.  She's been taking iron infusions at [facility] through her kidney doctor.  No overt bleeding.  No trouble with edema or ascites   Chief complaint is cirrhosis  ROS: complete GI ROS as described in HPI, all other review negative.  Constitutional:  No unintentional weight loss   Past Medical History:  Diagnosis Date  . Anxiety   . Bradycardia   . CHF (congestive heart failure) ([place])   . Chronic kidney disease    CKD Stage 3  . Depression   . Diabetes mellitus without complication ([place])   . Hyperkalemia   . Hypertension   . Lupus ([place])   . Peripheral  neuropathy   . Rheumatoid arthritis ([place])   . Seizures ([place])   . Shortness of breath dyspnea     Past Surgical History:  Procedure Laterality Date  . ABDOMINAL HYSTERECTOMY    . APPENDECTOMY    . CARDIAC CATHETERIZATION N/A 05/18/2016   Procedure: Right Heart Cath;  Surgeon: Burnell Blanks, MD;  Location: [facility];  Service: Cardiovascular;  Laterality: N/A;  . CYST EXCISION    . DENTAL SURGERY  01/18/15  . RIGHT HEART CATH N/A 06/12/2017   Procedure: RIGHT HEART CATH;  Surgeon: Larey Dresser, MD;  Location: [facility];  Service: Cardiovascular;  Laterality: N/A;    Current Outpatient Medications  Medication Sig Dispense Refill  . amLODipine (NORVASC) 5 MG tablet Take 5 mg by mouth 2 (two) times daily.     Marland Kitchen aspirin EC 81 MG tablet Take 81 mg by mouth daily.    Marland Kitchen atorvastatin (LIPITOR) 80 MG tablet Take 1 tablet (80 mg total) by mouth daily at 6 PM. 30 tablet 0  . calcitRIOL (ROCALTROL) 0.5 MCG capsule Take 0.5 mcg by mouth every Monday, Wednesday, and Friday.     . carvedilol (COREG) 25 MG tablet Take 0.5 tablets (12.5 mg total) by mouth 2 (two) times daily with a meal. 90 tablet 3  . diclofenac sodium (VOLTAREN) 1 % GEL Apply 2 g topically 4 (four) times daily as needed (Pain). 1 Tube 0  . docusate sodium (COLACE) 100 MG capsule Take 1 capsule (100 mg total) by mouth 2 (two) times daily. (Patient taking differently: Take 200 mg  by mouth daily as needed for mild constipation. ) 10 capsule 0  . feeding supplement, ENSURE ENLIVE, (ENSURE ENLIVE) LIQD Take 237 mLs by mouth 3 (three) times daily between meals. 237 mL 12  . ferrous sulfate 325 (65 FE) MG tablet Take 325 mg by mouth 2 (two) times daily with a meal.     . hydrALAZINE (APRESOLINE) 100 MG tablet Take 1 tablet (100 mg total) by mouth 3 (three) times daily. 90 tablet 0  . insulin aspart (NOVOLOG FLEXPEN) 100 UNIT/ML FlexPen Inject into the skin 3 (three) times daily with meals. Per sliding scale    .  ipratropium-albuterol (DUONEB) 0.5-2.5 (3) MG/3ML SOLN Take 3 mLs by nebulization every 6 (six) hours as needed (for shortness of breath).     . isosorbide mononitrate (IMDUR) 30 MG 24 hr tablet Take 1 tablet (30 mg total) by mouth daily. 30 tablet 0  . lactulose (CHRONULAC) 10 GM/15ML solution Take 45 mLs (30 g total) by mouth 2 (two) times daily. 240 mL 0  . levothyroxine (SYNTHROID, LEVOTHROID) 25 MCG tablet Take 25 mcg by mouth daily before breakfast.    . Omega-3 Fatty Acids (OMEGA 3 PO) Take 520 mg by mouth 2 (two) times daily.    . ondansetron (ZOFRAN-ODT) 8 MG disintegrating tablet Take 8 mg by mouth daily as needed for nausea or vomiting.     Marland Kitchen oxyCODONE (OXY IR/ROXICODONE) 5 MG immediate release tablet Take 5 mg by mouth every 6 (six) hours as needed for severe pain.    Marland Kitchen QUEtiapine (SEROQUEL) 25 MG tablet Take 25 mg by mouth 2 (two) times daily.    . sertraline (ZOLOFT) 50 MG tablet Take 100 mg by mouth 2 (two) times daily.    . sodium chloride (OCEAN) 0.65 % SOLN nasal spray Place 1 spray into both nostrils as needed for congestion. 1 Bottle 0  . torsemide (DEMADEX) 20 MG tablet TAKE 2 TABLETS BY MOUTH EVERY DAY 60 tablet 3  . traZODone (DESYREL) 100 MG tablet Take 1 tablet (100 mg total) by mouth at bedtime. 30 tablet 0  . Vitamin D, Ergocalciferol, (DRISDOL) 50000 units CAPS capsule Take 50,000 Units by mouth every 7 (seven) days.     No current facility-administered medications for this visit.     Allergies as of 04/17/2018 - Review Complete 04/17/2018  Allergen Reaction Noted  . Phenytoin sodium extended Itching 08/07/2015  . Potassium-containing compounds Other (See Comments) 08/06/2017  . Latex Swelling 01/22/2015    Family History  Problem Relation Age of Onset  . Heart failure Mother   . Liver cancer Father   . Lung cancer Father   . Cancer Sister   . Heart murmur Brother     Social History   Socioeconomic History  . Marital status: Widowed    Spouse name: Not  on file  . Number of children: Not on file  . Years of education: Not on file  . Highest education level: Not on file  Occupational History  . Not on file  Social Needs  . Financial resource strain: Not on file  . Food insecurity:    Worry: Not on file    Inability: Not on file  . Transportation needs:    Medical: Not on file    Non-medical: Not on file  Tobacco Use  . Smoking status: Never Smoker  . Smokeless tobacco: Never Used  Substance and Sexual Activity  . Alcohol use: No    Alcohol/week: 0.0 standard drinks  .  Drug use: No  . Sexual activity: Not on file  Lifestyle  . Physical activity:    Days per week: Not on file    Minutes per session: Not on file  . Stress: Not on file  Relationships  . Social connections:    Talks on phone: Not on file    Gets together: Not on file    Attends religious service: Not on file    Active member of club or organization: Not on file    Attends meetings of clubs or organizations: Not on file    Relationship status: Not on file  . Intimate partner violence:    Fear of current or ex partner: Not on file    Emotionally abused: Not on file    Physically abused: Not on file    Forced sexual activity: Not on file  Other Topics Concern  . Not on file  Social History Narrative  . Not on file     Physical Exam: BP 110/68   Pulse (!) 53   Ht 5' (1.524 m)   Wt 132 lb 9.6 oz (60.1 kg)   BMI 25.90 kg/m  Constitutional: generally well-appearing Psychiatric: alert and oriented x3 Abdomen: soft, nontender, nondistended, no obvious ascites, no peritoneal signs, normal bowel sounds No peripheral edema noted in lower extremities  Assessment and plan: 72 y.o. female with cirrhosis, likely cardiac related  She seem to be doing well overall, without significant fluid overload, no overt bleeding, no significant encephalopathy.  She will have a basic set of labs today to restage her liver disease and I would like to see her again in 6  months.  Prior to then she will need repeat hepatoma screening in February 2020 with alpha-fetoprotein and liver ultrasound.  Please see the "Patient Instructions" section for addition details about the plan.  Owens Loffler, MD [facility] 04/17/2018, 3:06 PM

## 2018-04-18 LAB — AFP TUMOR MARKER: AFP-Tumor Marker: 2 ng/mL

## 2018-04-20 ENCOUNTER — Encounter (HOSPITAL_COMMUNITY): Payer: Self-pay

## 2018-04-20 ENCOUNTER — Encounter (HOSPITAL_COMMUNITY)
Admission: RE | Admit: 2018-04-20 | Discharge: 2018-04-20 | Disposition: A | Discharge status: Home / Self Care | Payer: Medicare HMO | Source: Ambulatory Visit | Attending: Nephrology | Admitting: Nephrology

## 2018-04-20 DIAGNOSIS — D631 Anemia in chronic kidney disease: Secondary | ICD-10-CM | POA: Insufficient documentation

## 2018-04-20 DIAGNOSIS — N184 Chronic kidney disease, stage 4 (severe): Secondary | ICD-10-CM | POA: Insufficient documentation

## 2018-04-20 MED ORDER — SODIUM CHLORIDE 0.9 % IV SOLN
Freq: Once | INTRAVENOUS | Status: AC
  Administered 2018-04-20: 13:00:00 via INTRAVENOUS

## 2018-04-20 MED ORDER — SODIUM CHLORIDE 0.9 % IV SOLN
510.0000 mg | Freq: Once | INTRAVENOUS | Status: AC
  Administered 2018-04-20: 510 mg via INTRAVENOUS
  Filled 2018-04-20: qty 17

## 2018-05-14 ENCOUNTER — Ambulatory Visit (HOSPITAL_COMMUNITY)
Admission: RE | Admit: 2018-05-14 | Discharge: 2018-05-14 | Disposition: A | Discharge status: Home / Self Care | Payer: Medicare HMO | Source: Ambulatory Visit | Attending: Vascular Surgery | Admitting: Vascular Surgery

## 2018-05-14 ENCOUNTER — Encounter: Payer: Self-pay | Admitting: Vascular Surgery

## 2018-05-14 ENCOUNTER — Ambulatory Visit (HOSPITAL_COMMUNITY)
Admission: RE | Admit: 2018-05-14 | Discharge: 2018-05-14 | Disposition: A | Discharge status: Home / Self Care | Payer: Medicare HMO | Source: Ambulatory Visit

## 2018-05-14 ENCOUNTER — Ambulatory Visit: Payer: Managed Care, Other (non HMO) | Admitting: Vascular Surgery

## 2018-05-14 VITALS — BP 139/53 | HR 52 | Temp 97.5°F | Resp 18 | Ht 60.0 in | Wt 131.6 lb

## 2018-05-14 DIAGNOSIS — N185 Chronic kidney disease, stage 5: Secondary | ICD-10-CM

## 2018-05-14 NOTE — Progress Notes (Signed)
[facility] [place]  Patient name: Stacy Smith MRN: [id number] DOB: May 07, 1946 Sex: female  REASON FOR CONSULT: Discuss access for hemodialysis  HPI: Stacy Smith is a 72 y.o. female, who is here today for discussion of access for hemodialysis.  She is here today with her brother.  She has progressive chronic renal insufficiency.  Creatinine is in the 3 range.  He is not currently on hemodialysis.  We have been asked to discuss options and place a fistula currently if felt appropriate and hold off on a graft placement if the fistula is not possible.  She has had no prior access for hemodialysis.  She does not have a pacemaker.  She is not on anticoagulation.  She is right-handed  Past Medical History:  Diagnosis Date  . Anxiety   . Bradycardia   . CHF (congestive heart failure) ([place])   . Chronic kidney disease    CKD Stage 3  . Depression   . Diabetes mellitus without complication ([place])   . Hyperkalemia   . Hypertension   . Lupus ([place])   . Peripheral neuropathy   . Rheumatoid arthritis ([place])   . Seizures ([place])   . Shortness of breath dyspnea     Family History  Problem Relation Age of Onset  . Heart failure Mother   . Liver cancer Father   . Lung cancer Father   . Cancer Sister   . Heart murmur Brother     SOCIAL HISTORY: Social History   Socioeconomic History  . Marital status: Widowed    Spouse name: Not on file  . Number of children: Not on file  . Years of education: Not on file  . Highest education level: Not on file  Occupational History  . Not on file  Social Needs  . Financial resource strain: Not on file  . Food insecurity:    Worry: Not on file    Inability: Not on file  . Transportation needs:    Medical: Not on file    Non-medical: Not on file  Tobacco Use  . Smoking status: Never Smoker  . Smokeless tobacco: Never Used  Substance and Sexual Activity  . Alcohol use: No   Alcohol/week: 0.0 standard drinks  . Drug use: No  . Sexual activity: Not on file  Lifestyle  . Physical activity:    Days per week: Not on file    Minutes per session: Not on file  . Stress: Not on file  Relationships  . Social connections:    Talks on phone: Not on file    Gets together: Not on file    Attends religious service: Not on file    Active member of club or organization: Not on file    Attends meetings of clubs or organizations: Not on file    Relationship status: Not on file  . Intimate partner violence:    Fear of current or ex partner: Not on file    Emotionally abused: Not on file    Physically abused: Not on file    Forced sexual activity: Not on file  Other Topics Concern  . Not on file  Social History Narrative  . Not on file    Allergies  Allergen Reactions  . Phenytoin Sodium Extended Itching  . Potassium-Containing Compounds Other (See Comments)    unspecified  . Latex Swelling    Current Outpatient Medications  Medication Sig Dispense Refill  . amLODipine (NORVASC) 5 MG tablet  Take 5 mg by mouth 2 (two) times daily.     Marland Kitchen aspirin EC 81 MG tablet Take 81 mg by mouth daily.    Marland Kitchen atorvastatin (LIPITOR) 80 MG tablet Take 1 tablet (80 mg total) by mouth daily at 6 PM. 30 tablet 0  . calcitRIOL (ROCALTROL) 0.5 MCG capsule Take 0.5 mcg by mouth every Monday, Wednesday, and Friday.     . carvedilol (COREG) 25 MG tablet Take 0.5 tablets (12.5 mg total) by mouth 2 (two) times daily with a meal. 90 tablet 3  . diclofenac sodium (VOLTAREN) 1 % GEL Apply 2 g topically 4 (four) times daily as needed (Pain). 1 Tube 0  . docusate sodium (COLACE) 100 MG capsule Take 1 capsule (100 mg total) by mouth 2 (two) times daily. (Patient taking differently: Take 200 mg by mouth daily as needed for mild constipation. ) 10 capsule 0  . feeding supplement, ENSURE ENLIVE, (ENSURE ENLIVE) LIQD Take 237 mLs by mouth 3 (three) times daily between meals. 237 mL 12  . ferrous  sulfate 325 (65 FE) MG tablet Take 325 mg by mouth 2 (two) times daily with a meal.     . hydrALAZINE (APRESOLINE) 100 MG tablet Take 1 tablet (100 mg total) by mouth 3 (three) times daily. 90 tablet 0  . insulin aspart (NOVOLOG FLEXPEN) 100 UNIT/ML FlexPen Inject into the skin 3 (three) times daily with meals. Per sliding scale    . ipratropium-albuterol (DUONEB) 0.5-2.5 (3) MG/3ML SOLN Take 3 mLs by nebulization every 6 (six) hours as needed (for shortness of breath).     . isosorbide mononitrate (IMDUR) 30 MG 24 hr tablet Take 1 tablet (30 mg total) by mouth daily. 30 tablet 0  . lactulose (CHRONULAC) 10 GM/15ML solution Take 45 mLs (30 g total) by mouth 2 (two) times daily. 240 mL 0  . levothyroxine (SYNTHROID, LEVOTHROID) 25 MCG tablet Take 25 mcg by mouth daily before breakfast.    . Omega-3 Fatty Acids (OMEGA 3 PO) Take 520 mg by mouth 2 (two) times daily.    . ondansetron (ZOFRAN-ODT) 8 MG disintegrating tablet Take 8 mg by mouth daily as needed for nausea or vomiting.     Marland Kitchen oxyCODONE (OXY IR/ROXICODONE) 5 MG immediate release tablet Take 5 mg by mouth every 6 (six) hours as needed for severe pain.    Marland Kitchen QUEtiapine (SEROQUEL) 25 MG tablet Take 25 mg by mouth 2 (two) times daily.    . sertraline (ZOLOFT) 50 MG tablet Take 100 mg by mouth daily.     . sodium chloride (OCEAN) 0.65 % SOLN nasal spray Place 1 spray into both nostrils as needed for congestion. 1 Bottle 0  . torsemide (DEMADEX) 20 MG tablet TAKE 2 TABLETS BY MOUTH EVERY DAY 60 tablet 3  . traZODone (DESYREL) 100 MG tablet Take 1 tablet (100 mg total) by mouth at bedtime. 30 tablet 0  . Vitamin D, Ergocalciferol, (DRISDOL) 50000 units CAPS capsule Take 50,000 Units by mouth every 7 (seven) days.     No current facility-administered medications for this visit.     REVIEW OF SYSTEMS:  [X]  denotes positive finding, [ ]  denotes negative finding Cardiac  Comments:  Chest pain or chest pressure:    Shortness of breath upon exertion:  x   Short of breath when lying flat:    Irregular heart rhythm:        Vascular    Pain in calf, thigh, or hip brought on by ambulation:  Pain in feet at night that wakes you up from your sleep:  x   Blood clot in your veins:    Leg swelling:  x       Pulmonary    Oxygen at home:    Productive cough:     Wheezing:         Neurologic    Sudden weakness in arms or legs:     Sudden numbness in arms or legs:     Sudden onset of difficulty speaking or slurred speech:    Temporary loss of vision in one eye:     Problems with dizziness:  x       Gastrointestinal    Blood in stool:     Vomited blood:         Genitourinary    Burning when urinating:     Blood in urine:        Psychiatric    Major depression:  x       Hematologic    Bleeding problems:    Problems with blood clotting too easily:        Skin    Rashes or ulcers:        Constitutional    Fever or chills:      PHYSICAL EXAM: Vitals:   05/14/18 1349  BP: (!) 139/53  Pulse: (!) 52  Resp: 18  Temp: (!) 97.5 F (36.4 C)  TempSrc: Temporal  Weight: 131 lb 9.6 oz (59.7 kg)  Height: 5' (1.524 m)    GENERAL: The patient is a well-nourished female, in no acute distress. The vital signs are documented above. CARDIOVASCULAR: Plus radial pulses bilaterally.  2+ brachial pulses bilaterally.  Very small surface veins bilaterally PULMONARY: There is good air exchange  ABDOMEN: Soft and non-tender  MUSCULOSKELETAL: There are no major deformities or cyanosis. NEUROLOGIC: No focal weakness or paresthesias are detected. SKIN: There are no ulcers or rashes noted. PSYCHIATRIC: The patient has a normal affect.  DATA:  Noninvasive studies from [facility] were reviewed.  This shows a small cephalic and basilic veins bilaterally  MEDICAL ISSUES: Had long discussion with the patient regarding access options.  Gust tunneled dialysis catheter, AV fistula and AV graft.  I do not feel that she is a candidate for  AV fistula due to her very small cephalic and basilic vein size.  I would recommend a left upper arm AV graft when she approaches need for hemodialysis.  He understands that this would be ready for access within 1 month of placement and so would therefore defer until she is closer to time of needing access.  We are available to place left upper arm graft when appropriate   Rosetta Posner, MD [facility] Tel [phone] Pager [phone]

## 2018-05-15 ENCOUNTER — Encounter: Payer: Self-pay | Admitting: Nephrology

## 2018-05-25 IMAGING — CR DG ABDOMEN 1V
2 series · 2 of 2 positions shown · non-contrast
Comparison: None.

CLINICAL DATA: Umbilical pain.

EXAM:
ABDOMEN - 1 VIEW

[abdomen kub (1 of 2)]
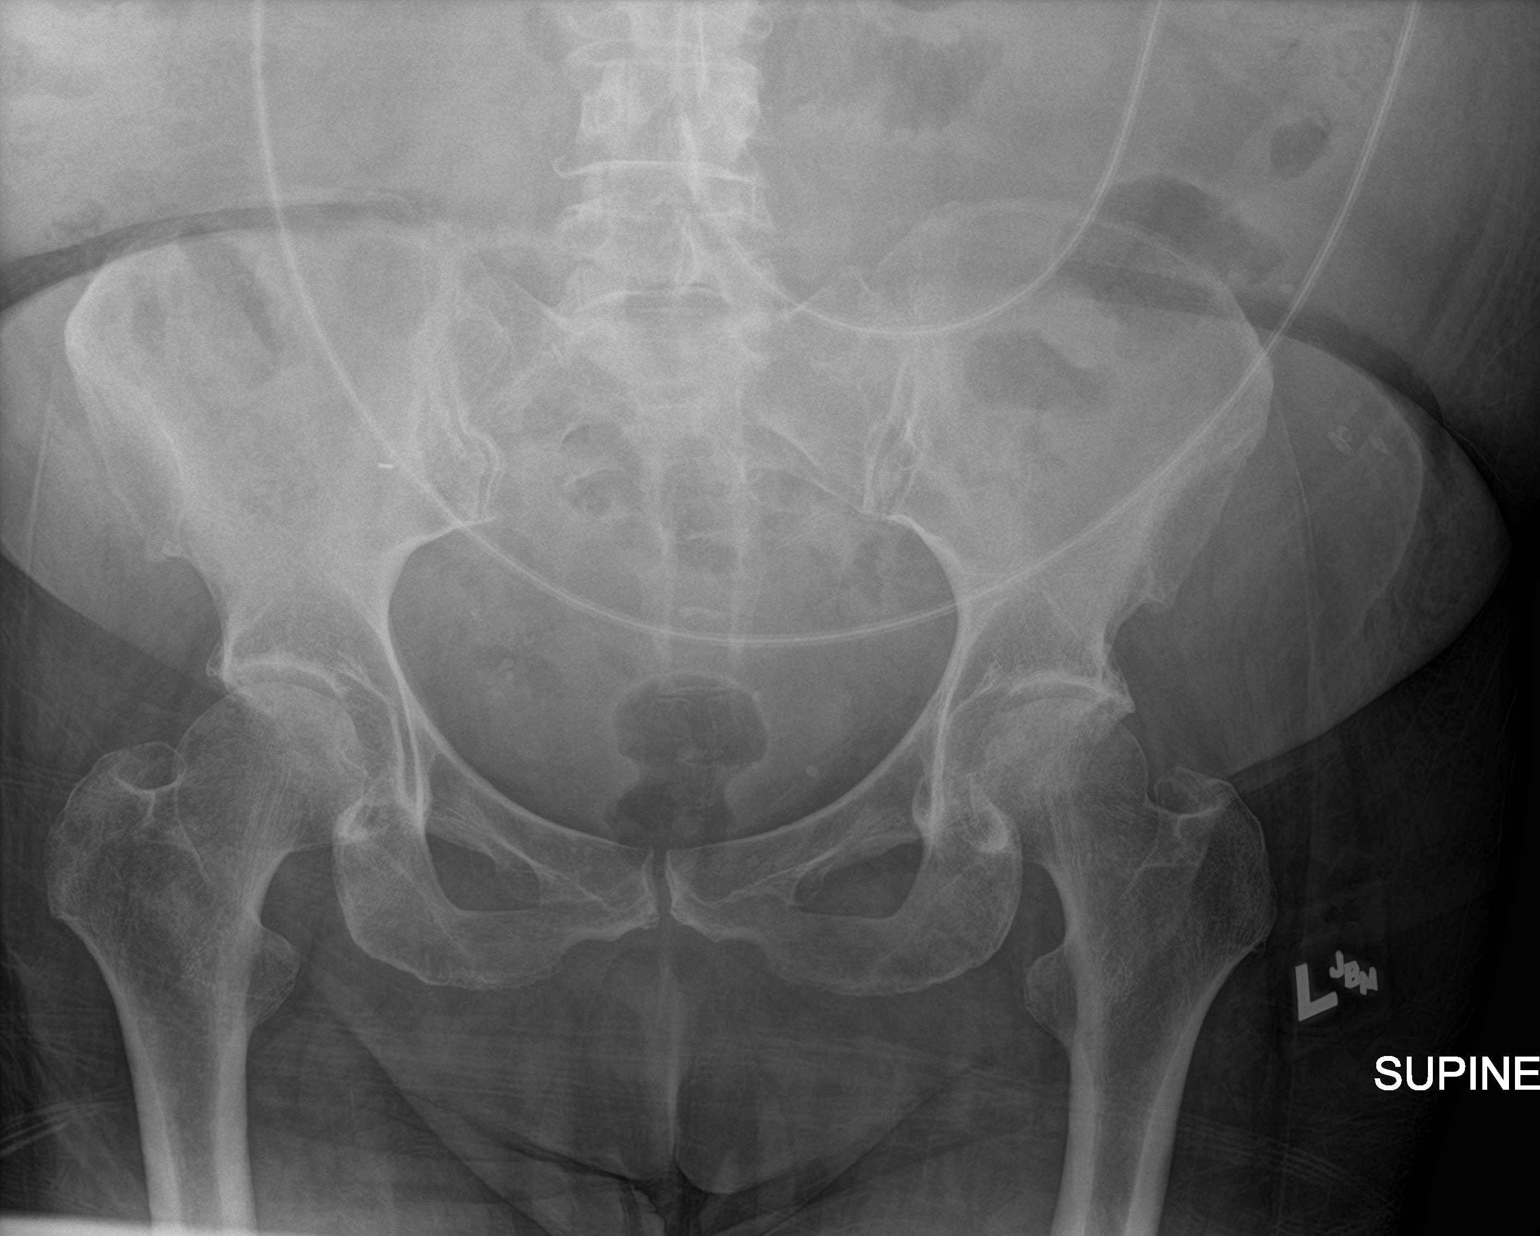

[abdomen kub (2 of 2)]
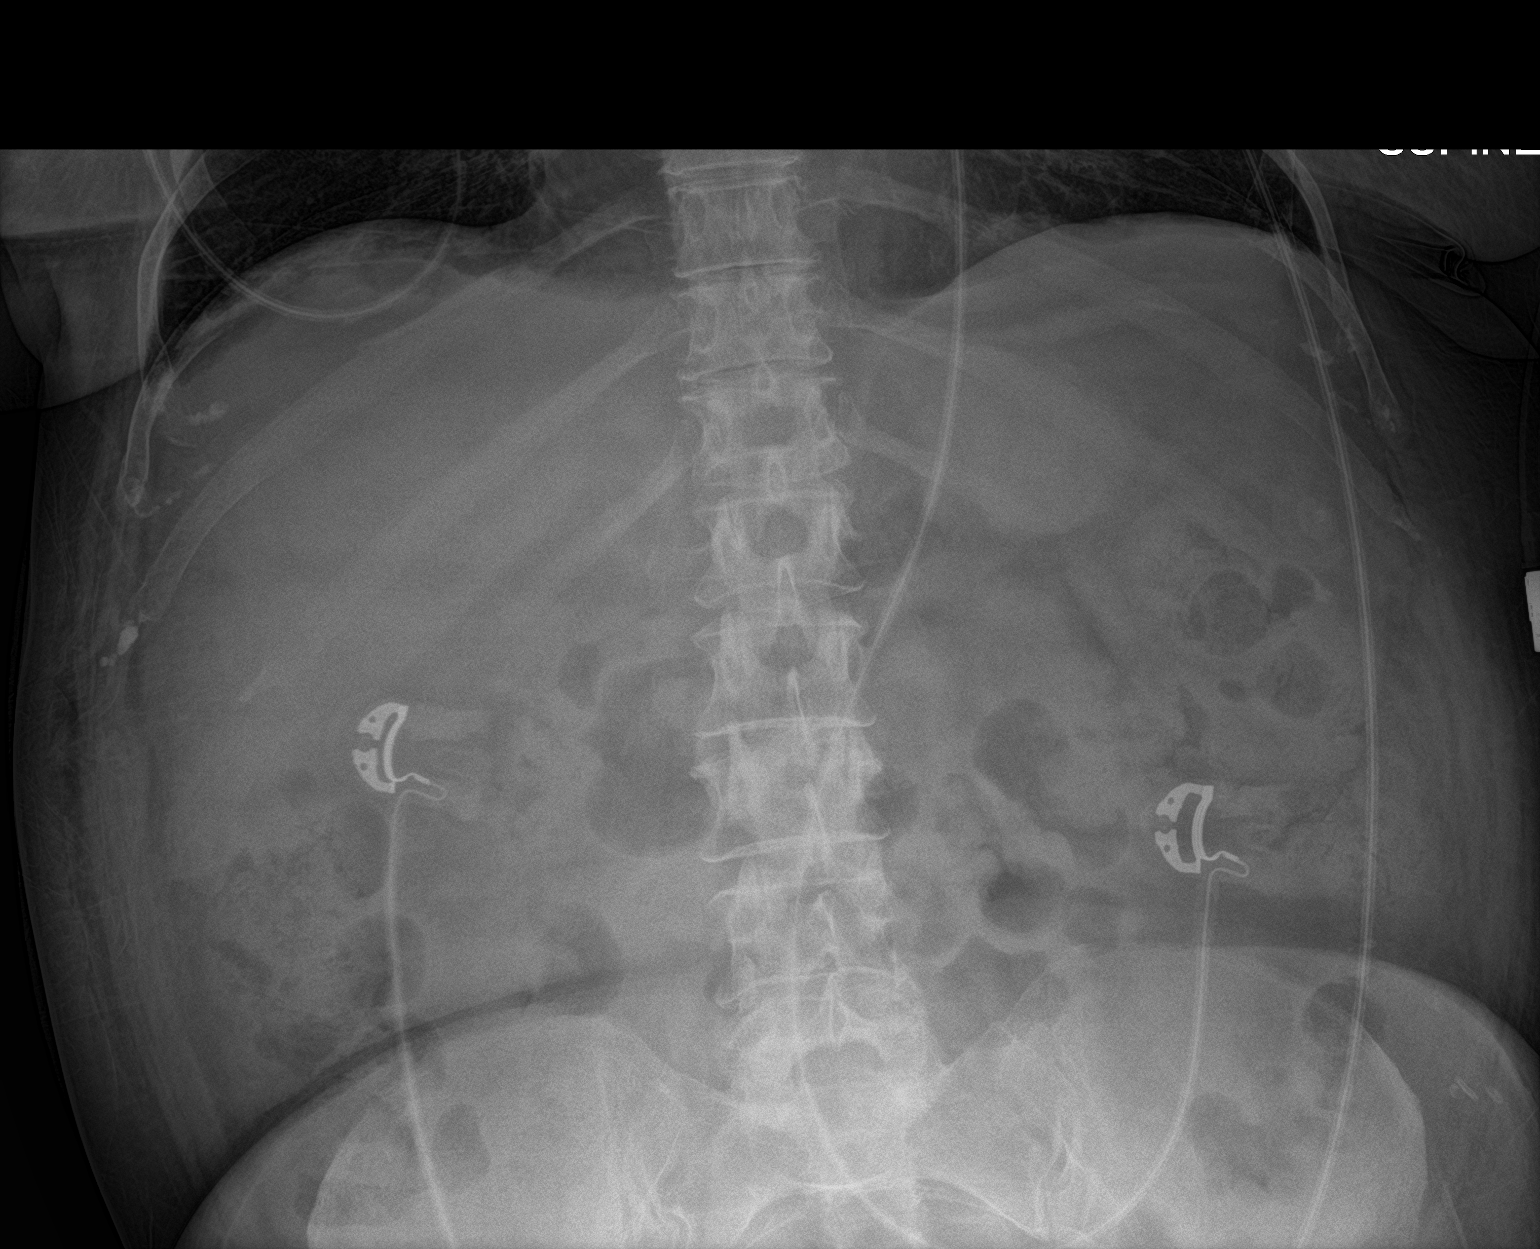

[2 of 2 positions shown; findings below may reference images not displayed]

FINDINGS: Bowel gas pattern is nonobstructive. No mass or mass effect. No free
peritoneal air. Few pelvic phleboliths are present. There is mild
degenerate change of the spine and hips.
IMPRESSION: Nonobstructive bowel gas pattern.

## 2018-06-18 ENCOUNTER — Telehealth: Payer: Self-pay | Admitting: Gastroenterology

## 2018-06-18 ENCOUNTER — Telehealth: Payer: Self-pay

## 2018-06-18 ENCOUNTER — Other Ambulatory Visit: Payer: Self-pay | Admitting: Gastroenterology

## 2018-06-18 DIAGNOSIS — K7460 Unspecified cirrhosis of liver: Secondary | ICD-10-CM

## 2018-06-18 NOTE — Telephone Encounter (Signed)
Spoke to [employer] family member who handles all her appointments. Per Dr Ardis Hughs last office visit she needs an [place] for hepatoma screening and AFT test this month. [place] scheduled for 06/27/18 11:30am at [facility]. She will have her lab drawn there as well. Orders placed, instructions and lab order mailed to Mr MCCrackin.

## 2018-06-18 NOTE — Telephone Encounter (Signed)
Stacy Smith you may be working on this.  See last office visit note

## 2018-06-18 NOTE — Telephone Encounter (Signed)
Pt's brother returning a call for pt to schedule ultrasound.

## 2018-06-18 NOTE — Telephone Encounter (Signed)
Spoke to [employer]. See today's note.

## 2018-06-20 ENCOUNTER — Encounter (HOSPITAL_COMMUNITY): Payer: Self-pay | Admitting: Cardiology

## 2018-06-20 ENCOUNTER — Ambulatory Visit (HOSPITAL_COMMUNITY)
Admission: RE | Admit: 2018-06-20 | Discharge: 2018-06-20 | Disposition: A | Discharge status: Home / Self Care | Payer: Medicare HMO | Source: Ambulatory Visit | Attending: Cardiology | Admitting: Cardiology

## 2018-06-20 VITALS — BP 170/74 | HR 64 | Wt 124.4 lb

## 2018-06-20 DIAGNOSIS — E785 Hyperlipidemia, unspecified: Secondary | ICD-10-CM

## 2018-06-20 DIAGNOSIS — I272 Pulmonary hypertension, unspecified: Secondary | ICD-10-CM | POA: Diagnosis not present

## 2018-06-20 DIAGNOSIS — Z8249 Family history of ischemic heart disease and other diseases of the circulatory system: Secondary | ICD-10-CM | POA: Insufficient documentation

## 2018-06-20 DIAGNOSIS — N184 Chronic kidney disease, stage 4 (severe): Secondary | ICD-10-CM

## 2018-06-20 DIAGNOSIS — Z79890 Hormone replacement therapy (postmenopausal): Secondary | ICD-10-CM | POA: Diagnosis not present

## 2018-06-20 DIAGNOSIS — K7460 Unspecified cirrhosis of liver: Secondary | ICD-10-CM | POA: Insufficient documentation

## 2018-06-20 DIAGNOSIS — Z7982 Long term (current) use of aspirin: Secondary | ICD-10-CM | POA: Diagnosis not present

## 2018-06-20 DIAGNOSIS — M069 Rheumatoid arthritis, unspecified: Secondary | ICD-10-CM | POA: Diagnosis not present

## 2018-06-20 DIAGNOSIS — I130 Hypertensive heart and chronic kidney disease with heart failure and stage 1 through stage 4 chronic kidney disease, or unspecified chronic kidney disease: Secondary | ICD-10-CM | POA: Diagnosis not present

## 2018-06-20 DIAGNOSIS — I350 Nonrheumatic aortic (valve) stenosis: Secondary | ICD-10-CM | POA: Insufficient documentation

## 2018-06-20 DIAGNOSIS — F329 Major depressive disorder, single episode, unspecified: Secondary | ICD-10-CM | POA: Insufficient documentation

## 2018-06-20 DIAGNOSIS — I5032 Chronic diastolic (congestive) heart failure: Secondary | ICD-10-CM | POA: Diagnosis not present

## 2018-06-20 DIAGNOSIS — Z794 Long term (current) use of insulin: Secondary | ICD-10-CM | POA: Insufficient documentation

## 2018-06-20 DIAGNOSIS — Z79899 Other long term (current) drug therapy: Secondary | ICD-10-CM | POA: Insufficient documentation

## 2018-06-20 DIAGNOSIS — E1122 Type 2 diabetes mellitus with diabetic chronic kidney disease: Secondary | ICD-10-CM | POA: Insufficient documentation

## 2018-06-20 DIAGNOSIS — I10 Essential (primary) hypertension: Secondary | ICD-10-CM

## 2018-06-20 LAB — BASIC METABOLIC PANEL
Anion gap: 13 (ref 5–15)
BUN: 34 mg/dL — ABNORMAL HIGH (ref 8–23)
CO2: 20 mmol/L — ABNORMAL LOW (ref 22–32)
Calcium: 9.8 mg/dL (ref 8.9–10.3)
Chloride: 105 mmol/L (ref 98–111)
Creatinine, Ser: 2.49 mg/dL — ABNORMAL HIGH (ref 0.44–1.00)
GFR, EST AFRICAN AMERICAN: 21 mL/min — AB (ref >60)
GFR, EST NON AFRICAN AMERICAN: 19 mL/min — AB (ref >60)
Glucose, Bld: 153 mg/dL — ABNORMAL HIGH (ref 70–99)
Potassium: 3.7 mmol/L (ref 3.5–5.1)
SODIUM: 138 mmol/L (ref 135–145)

## 2018-06-20 LAB — LIPID PANEL
Cholesterol: 116 mg/dL (ref 0–200)
HDL: 28 mg/dL — ABNORMAL LOW (ref >40)
LDL Cholesterol: 51 mg/dL (ref 0–99)
TRIGLYCERIDES: 187 mg/dL — AB (ref <150)
Total CHOL/HDL Ratio: 4.1 RATIO
VLDL: 37 mg/dL (ref 0–40)

## 2018-06-20 NOTE — Progress Notes (Signed)
Advanced Heart Failure Clinic Note   PCP: Dr Elvina Mattes ([name], [place]) HF Cardiology: Dr Aundra Dubin   HPI: 73 y.o. with history of CKD stage IV, diastolic CHF, HTN, cirrhosis, DM, depression, and pulmonary hypertension presents for followup of CHF.   Admitted 1/26-06/19/16 with acute on chronic diastolic HF. Echo (1/18) showed EF 65-70% with grade 1 DD and mildly dilated RV. RHC showed pulmonary venous hypertension. She was diuresed with IV lasix. Coreg and hydralazine were increased. GI consulted for abdominal pain with N/V. CT abdomen showed hepatic cirrhosis. She started lactulose for elevated ammonia. She had some CP while vomiting with troponin peak of 1.08, which was thought to be demand ischemia. No cath with CKD. No further CP. Discharge weight 158 pounds.   Admitted 07/2017 with volume overload and AKI. Renal followed along. Had initially recommended Cardiomems but with worsening renal function decided low yield. Creatinine 2.29 on discharge. Lasix change to torsemide.   She is doing reasonably well today.  Creatinine has slowly trended up, most recently 3.29 in 12/19.  BP is high today but she has not taken any meds yet; SBP at home generally runs < 140.  Weight is down 17 lbs.  No chest pain.  No dyspnea walking on flat ground.  No orthopnea/PND.   Labs (2/19): K 5.1, creatinine 2.51, BNP 793 Labs 07/14/17 K 4.2, creatinine 2.99, Ammonia 37. Labs (4/19): K 4.9, creatinine 3.46 Labs (7/19): K 4.1, creatinine 2.3, LDL 83, HDL 39, LFTs normal, hgb 9.3 Labs (12/19): K 4, creatinine 3.29  ECG (personally reviewed): NSR, LVH with repolarization.   PMH: 1. Chronic diastolic CHF:  - RHC (6/54): mean RA 8, PA 78/25 mean 45, mean PCWP 18, CI 3.6, PVR 4.6 WU - Echo (9/18): EF 60-65%, normal RV systolic function, PASP 58, mild TR and MR.  - Echo (1/19): EF 65-70%, mild aortic stenosis, mildly dilated RV normal systolic function, PASP 60 mmHg.  - RHC (1/19): mean RA 13, PA 61/27, mean PCWP 20, CI  5.87 Fick (PVR 1.9 WU), CI 5.34 Thermo (PVR 2 WU).  2. Pulmonary hypertension: RHC in 1/19 suggested pulmonary venous hypertension.  - V/Q scan (1/19): No chronic or acute PE 3. Cirrhosis: Abdominal US (1/19): Liver appears cirrhotic. No gallstones.  - HBV/HCV workup negative.  - ANA negative, ASMA negative.  4. Depression 5. CKD stage 4.  6. Diabetes mellitus 7. HTN 8. Rheumatoid arthritis  Review of systems complete and found to be negative unless listed in HPI.    Social History   Socioeconomic History  . Marital status: Widowed    Spouse name: Not on file  . Number of children: Not on file  . Years of education: Not on file  . Highest education level: Not on file  Occupational History  . Not on file  Social Needs  . Financial resource strain: Not on file  . Food insecurity:    Worry: Not on file    Inability: Not on file  . Transportation needs:    Medical: Not on file    Non-medical: Not on file  Tobacco Use  . Smoking status: Never Smoker  . Smokeless tobacco: Never Used  Substance and Sexual Activity  . Alcohol use: No    Alcohol/week: 0.0 standard drinks  . Drug use: No  . Sexual activity: Not on file  Lifestyle  . Physical activity:    Days per week: Not on file    Minutes per session: Not on file  . Stress: Not on  file  Relationships  . Social connections:    Talks on phone: Not on file    Gets together: Not on file    Attends religious service: Not on file    Active member of club or organization: Not on file    Attends meetings of clubs or organizations: Not on file    Relationship status: Not on file  . Intimate partner violence:    Fear of current or ex partner: Not on file    Emotionally abused: Not on file    Physically abused: Not on file    Forced sexual activity: Not on file  Other Topics Concern  . Not on file  Social History Narrative  . Not on file   Family History  Problem Relation Age of Onset  . Heart failure Mother   . Liver  cancer Father   . Lung cancer Father   . Cancer Sister   . Heart murmur Brother     Current Outpatient Medications  Medication Sig Dispense Refill  . amLODipine (NORVASC) 5 MG tablet Take 5 mg by mouth 2 (two) times daily.     Marland Kitchen aspirin EC 81 MG tablet Take 81 mg by mouth daily.    Marland Kitchen atorvastatin (LIPITOR) 80 MG tablet Take 1 tablet (80 mg total) by mouth daily at 6 PM. 30 tablet 0  . calcitRIOL (ROCALTROL) 0.5 MCG capsule Take 0.5 mcg by mouth every Monday, Wednesday, and Friday.     . carvedilol (COREG) 25 MG tablet Take 0.5 tablets (12.5 mg total) by mouth 2 (two) times daily with a meal. 90 tablet 3  . diclofenac sodium (VOLTAREN) 1 % GEL Apply 2 g topically 4 (four) times daily as needed (Pain). 1 Tube 0  . docusate sodium (COLACE) 100 MG capsule Take 1 capsule (100 mg total) by mouth 2 (two) times daily. (Patient taking differently: Take 200 mg by mouth daily as needed for mild constipation. ) 10 capsule 0  . feeding supplement, ENSURE ENLIVE, (ENSURE ENLIVE) LIQD Take 237 mLs by mouth 3 (three) times daily between meals. 237 mL 12  . ferrous sulfate 325 (65 FE) MG tablet Take 325 mg by mouth 2 (two) times daily with a meal.     . hydrALAZINE (APRESOLINE) 100 MG tablet Take 1 tablet (100 mg total) by mouth 3 (three) times daily. 90 tablet 0  . insulin aspart (NOVOLOG FLEXPEN) 100 UNIT/ML FlexPen Inject into the skin 3 (three) times daily with meals. Per sliding scale    . ipratropium-albuterol (DUONEB) 0.5-2.5 (3) MG/3ML SOLN Take 3 mLs by nebulization every 6 (six) hours as needed (for shortness of breath).     . isosorbide mononitrate (IMDUR) 30 MG 24 hr tablet Take 1 tablet (30 mg total) by mouth daily. 30 tablet 0  . lactulose (CHRONULAC) 10 GM/15ML solution Take 45 mLs (30 g total) by mouth 2 (two) times daily. 240 mL 0  . levothyroxine (SYNTHROID, LEVOTHROID) 25 MCG tablet Take 25 mcg by mouth daily before breakfast.    . Omega-3 Fatty Acids (OMEGA 3 PO) Take 520 mg by mouth 2  (two) times daily.    . ondansetron (ZOFRAN-ODT) 8 MG disintegrating tablet Take 8 mg by mouth daily as needed for nausea or vomiting.     Marland Kitchen oxyCODONE (OXY IR/ROXICODONE) 5 MG immediate release tablet Take 5 mg by mouth every 6 (six) hours as needed for severe pain.    Marland Kitchen QUEtiapine (SEROQUEL) 25 MG tablet Take 25 mg by mouth 2 (  two) times daily.    . sertraline (ZOLOFT) 50 MG tablet Take 100 mg by mouth daily.     . sodium chloride (OCEAN) 0.65 % SOLN nasal spray Place 1 spray into both nostrils as needed for congestion. 1 Bottle 0  . torsemide (DEMADEX) 20 MG tablet TAKE 2 TABLETS BY MOUTH EVERY DAY 60 tablet 3  . traZODone (DESYREL) 100 MG tablet Take 1 tablet (100 mg total) by mouth at bedtime. 30 tablet 0  . Vitamin D, Ergocalciferol, (DRISDOL) 50000 units CAPS capsule Take 50,000 Units by mouth every 7 (seven) days.     No current facility-administered medications for this encounter.    Vitals:   06/20/18 1113  BP: (!) 170/74  Pulse: 64  SpO2: 97%  Weight: 56.4 kg (124 lb 6.4 oz)   Wt Readings from Last 3 Encounters:  06/20/18 56.4 kg (124 lb 6.4 oz)  05/14/18 59.7 kg (131 lb 9.6 oz)  04/17/18 60.1 kg (132 lb 9.6 oz)    PHYSICAL EXAM:   General: NAD Neck: No JVD, no thyromegaly or thyroid nodule.  Lungs: Clear to auscultation bilaterally with normal respiratory effort. CV: Nondisplaced PMI.  Heart regular S1/S2, no S3/S4, 2/6 early SEM RUSB.  No peripheral edema.  No carotid bruit.  Normal pedal pulses.  Abdomen: Soft, nontender, no hepatosplenomegaly, no distention.  Skin: Intact without lesions or rashes.  Neurologic: Alert and oriented x 3.  Psych: Normal affect. Extremities: No clubbing or cyanosis.  HEENT: Normal.   ASSESSMENT & PLAN: 1. Chronic diastolic CHF: Echo 4/00 with EF 65-70%, mildly dilated RV. [place] in 1/18 with severe pulmonary hypertension, likely mixed pulmonary venous and pulmonary arterial HTN with PVR 4.6 WU. [place] 06/12/17, however, showed pulmonary venous  hypertension and elevated filling pressures with PVR about 2 WU.  NYHA class II symptoms.  She is not volume overloaded on exam and weight is down.  - Continue torsemide 40 mg daily.  BMET today.  - Repeat echo at followup in 6 months.  2. Pulmonary HTN: Pulmonary venous hypertension based on most recent RHC. No role for selective pulmonary vasodilators at this point.  3. CKD stage 4: Creatinine up to 3.29 in 12/19.    - BMET today.  - Follows with Dr .Justin Mend. 4. HTN: BP controlled.   5. Cirrhosis: Negative workup for viral and autoimmune hepatitis.  She does not drink ETOH.  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  - Follows with Dr. Ardis Hughs.  - She is on lactulose.  6. Depression:  - Continue Zoloft. Per PCP.  7. Aortic stenosis: Mild on last echo.   Followup in 6 months with echo.   Loralie Champagne, MD  06/20/2018

## 2018-06-20 NOTE — Patient Instructions (Signed)
Labs today. We will call If results are ABNORMAL.  Follow up and echo in  6 months.

## 2018-06-22 ENCOUNTER — Encounter (HOSPITAL_COMMUNITY): Payer: Self-pay | Admitting: Cardiology

## 2018-06-27 ENCOUNTER — Ambulatory Visit (HOSPITAL_COMMUNITY)
Admission: RE | Admit: 2018-06-27 | Discharge: 2018-06-27 | Disposition: A | Discharge status: Home / Self Care | Payer: Medicare HMO | Source: Ambulatory Visit | Attending: Gastroenterology | Admitting: Gastroenterology

## 2018-06-27 ENCOUNTER — Other Ambulatory Visit (HOSPITAL_COMMUNITY)
Admission: RE | Admit: 2018-06-27 | Discharge: 2018-06-27 | Disposition: A | Discharge status: Home / Self Care | Payer: Medicare HMO | Source: Ambulatory Visit | Attending: Gastroenterology | Admitting: Gastroenterology

## 2018-06-27 DIAGNOSIS — K7460 Unspecified cirrhosis of liver: Secondary | ICD-10-CM | POA: Diagnosis not present

## 2018-06-28 LAB — AFP TUMOR MARKER: AFP, Serum, Tumor Marker: 1.4 ng/mL (ref 0.0–8.3)

## 2018-06-29 ENCOUNTER — Other Ambulatory Visit: Payer: Self-pay

## 2018-06-29 DIAGNOSIS — K7460 Unspecified cirrhosis of liver: Secondary | ICD-10-CM

## 2018-07-24 ENCOUNTER — Other Ambulatory Visit (HOSPITAL_COMMUNITY): Payer: Self-pay | Admitting: Student

## 2018-08-11 ENCOUNTER — Other Ambulatory Visit (HOSPITAL_COMMUNITY): Payer: Self-pay | Admitting: Student

## 2018-08-28 ENCOUNTER — Other Ambulatory Visit (HOSPITAL_COMMUNITY): Payer: Self-pay | Admitting: Student

## 2018-10-17 ENCOUNTER — Telehealth (HOSPITAL_COMMUNITY): Payer: Self-pay | Admitting: Vascular Surgery

## 2018-10-17 NOTE — Telephone Encounter (Signed)
  I called and personally talked to Stacy Smith.   She reports increased leg edema over the last few days and 3 pound weight gain. . She does not have her medications at her home. She does not know which medications she takes.  She says her daughter will bring her medications tomorrow. I offered to call her daughter however she did not think that was necessary. She said that she would write down the changes.   I have instructed her to take an extra torsemide today and tomorrow then go back to 40 mg torsemide daily.   She verbalized understanding and appreciated the phone call.    Amy Clegg 3:56 PM

## 2018-10-17 NOTE — Telephone Encounter (Signed)
Pt brother called wanting to get ASAP appt w/ [facility], pt brother states she is having swelling, and would like someone to call the to Assess her over the phone please advise

## 2018-10-17 NOTE — Telephone Encounter (Signed)
Returned call to patient, patient reports her weight has increased 3-4 lbs and mildly more SOB  Advised I would speak with provider and return call with further instuctions

## 2018-10-22 ENCOUNTER — Other Ambulatory Visit (HOSPITAL_COMMUNITY): Payer: Self-pay | Admitting: Cardiology

## 2018-10-22 MED ORDER — CARVEDILOL 25 MG PO TABS: 12.5000 mg | ORAL_TABLET | Freq: Two times a day (BID) | ORAL | 3 refills | Status: DC

## 2018-11-06 ENCOUNTER — Other Ambulatory Visit: Payer: Self-pay

## 2018-11-06 ENCOUNTER — Encounter (HOSPITAL_COMMUNITY)
Admission: RE | Admit: 2018-11-06 | Discharge: 2018-11-06 | Disposition: A | Discharge status: Home / Self Care | Payer: Medicare HMO | Source: Ambulatory Visit | Attending: Hematology | Admitting: Hematology

## 2018-11-06 ENCOUNTER — Encounter (HOSPITAL_COMMUNITY)
Admission: RE | Admit: 2018-11-06 | Discharge: 2018-11-06 | Disposition: A | Discharge status: Home / Self Care | Payer: Medicare HMO | Source: Ambulatory Visit | Attending: Nephrology | Admitting: Nephrology

## 2018-11-06 DIAGNOSIS — D631 Anemia in chronic kidney disease: Secondary | ICD-10-CM | POA: Diagnosis not present

## 2018-11-06 DIAGNOSIS — N184 Chronic kidney disease, stage 4 (severe): Secondary | ICD-10-CM | POA: Insufficient documentation

## 2018-11-06 LAB — IRON AND TIBC
Iron: 89 ug/dL (ref 28–170)
Saturation Ratios: 42 % — ABNORMAL HIGH (ref 10.4–31.8)
TIBC: 213 ug/dL — ABNORMAL LOW (ref 250–450)
UIBC: 124 ug/dL

## 2018-11-06 LAB — FERRITIN: Ferritin: 477 ng/mL — ABNORMAL HIGH (ref 11–307)

## 2018-11-06 MED ORDER — EPOETIN ALFA-EPBX 10000 UNIT/ML IJ SOLN
INTRAMUSCULAR | Status: AC
  Filled 2018-11-06: qty 1

## 2018-11-06 MED ORDER — EPOETIN ALFA-EPBX 10000 UNIT/ML IJ SOLN
10000.0000 [IU] | Freq: Once | INTRAMUSCULAR | Status: AC
  Administered 2018-11-06: 10000 [IU] via SUBCUTANEOUS

## 2018-11-07 LAB — POCT HEMOGLOBIN-HEMACUE: Hemoglobin: 10.6 g/dL — ABNORMAL LOW (ref 12.0–15.0)

## 2018-11-20 ENCOUNTER — Other Ambulatory Visit (HOSPITAL_COMMUNITY): Payer: Medicare HMO

## 2018-11-27 ENCOUNTER — Encounter (HOSPITAL_COMMUNITY)
Admission: RE | Admit: 2018-11-27 | Discharge: 2018-11-27 | Disposition: A | Discharge status: Home / Self Care | Payer: Medicare HMO | Source: Ambulatory Visit | Attending: Nephrology | Admitting: Nephrology

## 2018-11-27 ENCOUNTER — Encounter (HOSPITAL_COMMUNITY): Payer: Medicare HMO

## 2018-11-27 DIAGNOSIS — N184 Chronic kidney disease, stage 4 (severe): Secondary | ICD-10-CM | POA: Insufficient documentation

## 2018-11-27 DIAGNOSIS — D631 Anemia in chronic kidney disease: Secondary | ICD-10-CM | POA: Insufficient documentation

## 2018-12-05 ENCOUNTER — Encounter (HOSPITAL_COMMUNITY)
Admission: RE | Admit: 2018-12-05 | Discharge: 2018-12-05 | Disposition: A | Discharge status: Home / Self Care | Payer: Medicare HMO | Source: Ambulatory Visit | Attending: Nephrology | Admitting: Nephrology

## 2018-12-05 ENCOUNTER — Other Ambulatory Visit: Payer: Self-pay

## 2018-12-05 DIAGNOSIS — N184 Chronic kidney disease, stage 4 (severe): Secondary | ICD-10-CM | POA: Diagnosis not present

## 2018-12-05 DIAGNOSIS — D631 Anemia in chronic kidney disease: Secondary | ICD-10-CM | POA: Diagnosis not present

## 2018-12-05 LAB — POCT HEMOGLOBIN-HEMACUE: Hemoglobin: 10.7 g/dL — ABNORMAL LOW (ref 12.0–15.0)

## 2018-12-05 MED ORDER — EPOETIN ALFA-EPBX 10000 UNIT/ML IJ SOLN
10000.0000 [IU] | Freq: Once | INTRAMUSCULAR | Status: AC
  Administered 2018-12-05: 10000 [IU] via SUBCUTANEOUS

## 2018-12-05 MED ORDER — EPOETIN ALFA-EPBX 10000 UNIT/ML IJ SOLN
INTRAMUSCULAR | Status: AC
  Filled 2018-12-05: qty 1

## 2018-12-19 ENCOUNTER — Other Ambulatory Visit: Payer: Self-pay

## 2018-12-19 ENCOUNTER — Encounter (HOSPITAL_COMMUNITY)
Admission: RE | Admit: 2018-12-19 | Discharge: 2018-12-19 | Disposition: A | Discharge status: Home / Self Care | Payer: Medicare HMO | Source: Ambulatory Visit | Attending: Nephrology | Admitting: Nephrology

## 2018-12-19 ENCOUNTER — Encounter (HOSPITAL_COMMUNITY): Payer: Self-pay

## 2018-12-19 DIAGNOSIS — D631 Anemia in chronic kidney disease: Secondary | ICD-10-CM | POA: Insufficient documentation

## 2018-12-19 DIAGNOSIS — N184 Chronic kidney disease, stage 4 (severe): Secondary | ICD-10-CM | POA: Diagnosis not present

## 2018-12-19 LAB — IRON AND TIBC
Iron: 82 ug/dL (ref 28–170)
Saturation Ratios: 42 % — ABNORMAL HIGH (ref 10.4–31.8)
TIBC: 196 ug/dL — ABNORMAL LOW (ref 250–450)
UIBC: 114 ug/dL

## 2018-12-19 LAB — FERRITIN: Ferritin: 343 ng/mL — ABNORMAL HIGH (ref 11–307)

## 2018-12-19 LAB — POCT HEMOGLOBIN-HEMACUE: Hemoglobin: 9.4 g/dL — ABNORMAL LOW (ref 12.0–15.0)

## 2018-12-19 MED ORDER — EPOETIN ALFA-EPBX 10000 UNIT/ML IJ SOLN
INTRAMUSCULAR | Status: AC
  Filled 2018-12-19: qty 1

## 2018-12-19 MED ORDER — EPOETIN ALFA-EPBX 10000 UNIT/ML IJ SOLN
10000.0000 [IU] | Freq: Once | INTRAMUSCULAR | Status: AC
  Administered 2018-12-19: 10000 [IU] via SUBCUTANEOUS

## 2018-12-27 ENCOUNTER — Encounter: Payer: Self-pay | Admitting: Physician Assistant

## 2018-12-27 ENCOUNTER — Ambulatory Visit: Payer: Medicare HMO | Admitting: Physician Assistant

## 2018-12-27 ENCOUNTER — Other Ambulatory Visit (INDEPENDENT_AMBULATORY_CARE_PROVIDER_SITE_OTHER): Payer: Medicare HMO

## 2018-12-27 VITALS — BP 132/60 | HR 62 | Temp 98.0°F | Ht 62.0 in | Wt 139.1 lb

## 2018-12-27 DIAGNOSIS — K7460 Unspecified cirrhosis of liver: Secondary | ICD-10-CM

## 2018-12-27 DIAGNOSIS — G9340 Encephalopathy, unspecified: Secondary | ICD-10-CM | POA: Diagnosis not present

## 2018-12-27 DIAGNOSIS — R188 Other ascites: Secondary | ICD-10-CM | POA: Diagnosis not present

## 2018-12-27 DIAGNOSIS — R609 Edema, unspecified: Secondary | ICD-10-CM

## 2018-12-27 DIAGNOSIS — R600 Localized edema: Secondary | ICD-10-CM

## 2018-12-27 LAB — CBC WITH DIFFERENTIAL/PLATELET
Basophils Absolute: 0 10*3/uL (ref 0.0–0.1)
Basophils Relative: 0.4 % (ref 0.0–3.0)
Eosinophils Absolute: 0.2 10*3/uL (ref 0.0–0.7)
Eosinophils Relative: 4.8 % (ref 0.0–5.0)
HCT: 31.9 % — ABNORMAL LOW (ref 36.0–46.0)
Hemoglobin: 10.6 g/dL — ABNORMAL LOW (ref 12.0–15.0)
Lymphocytes Relative: 17.8 % (ref 12.0–46.0)
Lymphs Abs: 0.8 10*3/uL (ref 0.7–4.0)
MCHC: 33.3 g/dL (ref 30.0–36.0)
MCV: 89.2 fl (ref 78.0–100.0)
Monocytes Absolute: 0.2 10*3/uL (ref 0.1–1.0)
Monocytes Relative: 5.6 % (ref 3.0–12.0)
Neutro Abs: 3.2 10*3/uL (ref 1.4–7.7)
Neutrophils Relative %: 71.4 % (ref 43.0–77.0)
Platelets: 115 10*3/uL — ABNORMAL LOW (ref 150.0–400.0)
RBC: 3.58 Mil/uL — ABNORMAL LOW (ref 3.87–5.11)
RDW: 14.4 % (ref 11.5–15.5)
WBC: 4.4 10*3/uL (ref 4.0–10.5)

## 2018-12-27 LAB — COMPREHENSIVE METABOLIC PANEL
ALT: 20 U/L (ref 0–35)
AST: 26 U/L (ref 0–37)
Albumin: 4.4 g/dL (ref 3.5–5.2)
Alkaline Phosphatase: 70 U/L (ref 39–117)
BUN: 62 mg/dL — ABNORMAL HIGH (ref 6–23)
CO2: 22 mEq/L (ref 19–32)
Calcium: 9.8 mg/dL (ref 8.4–10.5)
Chloride: 99 mEq/L (ref 96–112)
Creatinine, Ser: 3 mg/dL — ABNORMAL HIGH (ref 0.40–1.20)
GFR: 15.27 mL/min — ABNORMAL LOW (ref >60.00)
Glucose, Bld: 177 mg/dL — ABNORMAL HIGH (ref 70–99)
Potassium: 3.9 mEq/L (ref 3.5–5.1)
Sodium: 135 mEq/L (ref 135–145)
Total Bilirubin: 0.7 mg/dL (ref 0.2–1.2)
Total Protein: 7.9 g/dL (ref 6.0–8.3)

## 2018-12-27 LAB — PROTIME-INR
INR: 1.2 ratio — ABNORMAL HIGH (ref 0.8–1.0)
Prothrombin Time: 13.9 s — ABNORMAL HIGH (ref 9.6–13.1)

## 2018-12-27 LAB — AMMONIA: Ammonia: 51 umol/L — ABNORMAL HIGH (ref 11–35)

## 2018-12-27 MED ORDER — POTASSIUM CHLORIDE ER 10 MEQ PO TBCR: 20.0000 meq | EXTENDED_RELEASE_TABLET | Freq: Two times a day (BID) | ORAL | 0 refills | Status: DC

## 2018-12-27 NOTE — Patient Instructions (Addendum)
If you are age 73 or older, your body mass index should be between 23-30. Your Body mass index is 25.45 kg/m. If this is out of the aforementioned range listed, please consider follow up with your Primary Care Provider.  If you are age 2 or younger, your body mass index should be between 19-25. Your Body mass index is 25.45 kg/m. If this is out of the aformentioned range listed, please consider follow up with your Primary Care Provider.   Your provider has requested that you go to the basement level for lab work before leaving today. Press "B" on the elevator. The lab is located at the first door on the left as you exit the elevator.  We have sent the following medications to your pharmacy for you to pick up at your convenience: K-Dur 20 meq  Twice daily for five days with Demadex (Torsemide)   Continue all meds the same except INCREASE Torsemide to 20 mg take 2  twice daily for 3 days only then back to 2 every morning.  Please follow up with your Cardiologist for increased fluid retention.  We will contact his office for an appointment.  Follow up with Dr. Ardis Hughs in six months or sooner if needed.  Thank you for choosing me and [facility].   Amy Esterwood, PA-C

## 2018-12-27 NOTE — Progress Notes (Signed)
Subjective:    Patient ID: Stacy Smith, female    DOB: 1945-10-21, 73 y.o.   MRN: [id number]  HPI Rooney is a pleasant 73 year old white female, known to Dr. Ardis Hughs with history of cirrhosis felt likely cardiogenic versus fatty liver.  This is been complicated by mild encephalopathy.  She was last seen in December 2019 at that time had a M ELD of 21. She has history of congestive heart failure/right-sided with pulmonary hypertension, adult onset diabetes mellitus, chronic kidney disease stage IV, history of seizure disorder, rheumatoid arthritis and history of iron deficiency anemia. She comes in today for follow-up, but has also been concerned as she has been retaining some fluid in her lower extremities over the past 2 to 3 weeks and has gained about 6 pounds.  She and her husband report that they have had a difficult time getting an appointment with cardiology. She says her daughter and attributes her medications, and that she has been taking all of her meds as instructed. She has not had any chest pain or dyspnea.  No complaints of abdominal pain though feels a little bit more full. Labs done by PCP 12/19/2018 with ferritin of 343, iron 82, TIBC 196 and iron sat of 42, hemoglobin was 9.4. AFP normal February 2020. Upper abdominal ultrasound February 2020 with cirrhotic appearing liver, no mass evident and portal vein patent. She has not had EGD for variceal surveillance but has been on Corgard for multiple purpose.  Review of Systems Pertinent positive and negative review of systems were noted in the above HPI section.  All other review of systems was otherwise negative.  Outpatient Encounter Medications as of 12/27/2018  Medication Sig  . amLODipine (NORVASC) 5 MG tablet Take 5 mg by mouth 2 (two) times daily.   Marland Kitchen aspirin EC 81 MG tablet Take 81 mg by mouth daily.  Marland Kitchen atorvastatin (LIPITOR) 80 MG tablet Take 1 tablet (80 mg total) by mouth daily at 6 PM.  . calcitRIOL (ROCALTROL) 0.5  MCG capsule Take 0.5 mcg by mouth every Monday, Wednesday, and Friday.   . carvedilol (COREG) 12.5 MG tablet TAKE ONE TABLET BY MOUTH TWICE A DAY WITH MEALS  . carvedilol (COREG) 25 MG tablet Take 0.5 tablets (12.5 mg total) by mouth 2 (two) times daily with a meal.  . diclofenac sodium (VOLTAREN) 1 % GEL Apply 2 g topically 4 (four) times daily as needed (Pain).  Marland Kitchen docusate sodium (COLACE) 100 MG capsule Take 1 capsule (100 mg total) by mouth 2 (two) times daily. (Patient taking differently: Take 200 mg by mouth daily as needed for mild constipation. )  . epoetin alfa-epbx (RETACRIT) 66599 UNIT/ML injection 10,000 Units every 14 (fourteen) days.  . feeding supplement, ENSURE ENLIVE, (ENSURE ENLIVE) LIQD Take 237 mLs by mouth 3 (three) times daily between meals.  . ferrous sulfate 325 (65 FE) MG tablet Take 325 mg by mouth 2 (two) times daily with a meal.   . gabapentin (NEURONTIN) 100 MG capsule Take 100 mg by mouth daily.  . hydrALAZINE (APRESOLINE) 100 MG tablet Take 1 tablet (100 mg total) by mouth 3 (three) times daily.  . insulin aspart (NOVOLOG FLEXPEN) 100 UNIT/ML FlexPen Inject into the skin 3 (three) times daily with meals. Per sliding scale  . ipratropium-albuterol (DUONEB) 0.5-2.5 (3) MG/3ML SOLN Take 3 mLs by nebulization every 6 (six) hours as needed (for shortness of breath).   . isosorbide mononitrate (IMDUR) 30 MG 24 hr tablet Take 1 tablet (30 mg  total) by mouth daily.  Marland Kitchen lactulose (CHRONULAC) 10 GM/15ML solution Take 45 mLs (30 g total) by mouth 2 (two) times daily.  Marland Kitchen levothyroxine (SYNTHROID, LEVOTHROID) 25 MCG tablet Take 25 mcg by mouth daily before breakfast.  . Omega-3 Fatty Acids (OMEGA 3 PO) Take 520 mg by mouth 2 (two) times daily.  . ondansetron (ZOFRAN-ODT) 8 MG disintegrating tablet Take 8 mg by mouth daily as needed for nausea or vomiting.   Marland Kitchen oxyCODONE (OXY IR/ROXICODONE) 5 MG immediate release tablet Take 5 mg by mouth every 6 (six) hours as needed for severe pain.   Marland Kitchen QUEtiapine (SEROQUEL) 25 MG tablet Take 25 mg by mouth 2 (two) times daily.  . sertraline (ZOLOFT) 50 MG tablet Take 100 mg by mouth daily.   . sodium chloride (OCEAN) 0.65 % SOLN nasal spray Place 1 spray into both nostrils as needed for congestion.  . torsemide (DEMADEX) 20 MG tablet TAKE 2 TABLETS BY MOUTH EVERY DAY  . traZODone (DESYREL) 100 MG tablet Take 1 tablet (100 mg total) by mouth at bedtime.  . Vitamin D, Ergocalciferol, (DRISDOL) 50000 units CAPS capsule Take 50,000 Units by mouth every 7 (seven) days.  . potassium chloride (K-DUR) 10 MEQ tablet Take 2 tablets (20 mEq total) by mouth 2 (two) times daily. Take only for 5 days.   No facility-administered encounter medications on file as of 12/27/2018.    Allergies  Allergen Reactions  . Phenytoin Sodium Extended Itching  . Potassium-Containing Compounds Other (See Comments)    unspecified  . Latex Swelling   Patient Active Problem List   Diagnosis Date Noted  . CKD (chronic kidney disease), stage IV ([place]) 09/14/2017  . Sinus bradycardia 09/14/2017  . Anasarca 08/06/2017  . Cirrhosis ([place]) 07/20/2017  . Diastolic CHF ([place]) 53/66/4403  . Right heart failure due to pulmonary hypertension ([place]) 06/10/2017  . Hyponatremia 06/10/2017  . Iron deficiency anemia due to chronic blood loss 06/10/2017  . Major depression, recurrent, chronic ([place]) 06/10/2017  . Pulmonary HTN ([place])   . Seizures ([place])   . Delirium   . Diabetes type 2, uncontrolled ([place])   . Demand ischemia ([place])   . Sinus tachycardia   . Ischemic chest pain ([place])   . Diabetes type 2, controlled ([place])   . Convulsion ([place])   . Altered mental state   . HLD (hyperlipidemia)   . Rheumatoid arthritis ([place])   . Lupus (systemic lupus erythematosus) ([place])   . Hypokalemia   . Hypomagnesemia   . Convulsions/seizures ([place]) 01/22/2015  . Diabetes ([place]) 01/22/2015  . Essential hypertension 01/22/2015  . Hyperlipidemia 01/22/2015  . Seizure ([place]) 01/22/2015   Social  History   Socioeconomic History  . Marital status: Widowed    Spouse name: Not on file  . Number of children: Not on file  . Years of education: Not on file  . Highest education level: Not on file  Occupational History  . Not on file  Social Needs  . Financial resource strain: Not on file  . Food insecurity    Worry: Not on file    Inability: Not on file  . Transportation needs    Medical: Not on file    Non-medical: Not on file  Tobacco Use  . Smoking status: Never Smoker  . Smokeless tobacco: Never Used  Substance and Sexual Activity  . Alcohol use: No    Alcohol/week: 0.0 standard drinks  . Drug use: No  . Sexual activity: Not on file  Lifestyle  .  Physical activity    Days per week: Not on file    Minutes per session: Not on file  . Stress: Not on file  Relationships  . Social Herbalist on phone: Not on file    Gets together: Not on file    Attends religious service: Not on file    Active member of club or organization: Not on file    Attends meetings of clubs or organizations: Not on file    Relationship status: Not on file  . Intimate partner violence    Fear of current or ex partner: Not on file    Emotionally abused: Not on file    Physically abused: Not on file    Forced sexual activity: Not on file  Other Topics Concern  . Not on file  Social History Narrative  . Not on file    Ms. Frady's family history includes Cancer in her sister; Heart failure in her mother; Heart murmur in her brother; Liver cancer in her father; Lung cancer in her father.      Objective:    Vitals:   12/27/18 1537  BP: 132/60  Pulse: 62  Temp: 98 F (36.7 C)  SpO2: 90%    Physical Exam Well-developed well-nourished elderly white female, accompanied by her husband, in no acute distress.  Height, Weight, 139 BMI 25.4  HEENT; nontraumatic normocephalic, EOMI, PE R LA, sclera anicteric. Oropharynx; not examined/wearing mass/COVID Neck; supple, no JVD  Cardiovascular; regular rate and rhythm with S1-S2, no murmur rub or gallop Pulmonary; Clear bilaterally no rhonchi or rails Abdomen; soft, nontender, nondistended, no palpable mass or hepatosplenomegaly, bowel sounds are active,, no fluid wave Rectal; not done today Skin; benign exam, no jaundice rash or appreciable lesions Extremities; no clubbing cyanosis she has 1-2+ edema to the knees bilaterally Neuro/Psych; alert and oriented x4, grossly nonfocal mood and affect appropriate, mentating well, no asterixis       Assessment & Plan:   #33 73 year old white female with 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 Last M ELD calculated at 21 about 6 months ago. Now presenting with increase in peripheral edema despite chronic dosing of Demadex 40 mg daily.  Associated weight gain of 6 pounds. No appreciable ascites on exam.  #2 mild hepatic encephalopathy-appears stable on lactulose 45 cc twice daily 3.  Right heart failure with pulmonary hypertension #4 adult onset diabetes mellitus 5.  Chronic kidney disease stage IV 6.  Rheumatoid arthritis 7.  History of iron deficiency anemia-most recent labs more consistent with anemia of chronic disease  Plan; CBC with differential, pro time/INR, ammonia, and c-Met Increase torsemide/Demadex to 40 mg p.o. twice daily x3 days, then back to 40 mg once daily. We reviewed strict adherence to 2 g sodium diet. Add K. Dur 20 mg p.o. twice daily x5 days. Continue lactulose 45 cc twice daily Patient needs cardiology follow-up, we will contact to get her an appointment.  Diuretics have been managed by cardiology. Plan follow-up with Dr. Ardis Hughs or myself in 6 months or sooner as needed.   S  PA-C 12/27/2018   Cc: Jacqualine Code, DO

## 2018-12-28 ENCOUNTER — Telehealth: Payer: Self-pay

## 2018-12-28 NOTE — Telephone Encounter (Signed)
Called patient to let her know that we made an appointment for her with Dr. Aundra Dubin on 2019-01-30 at 2 pm.  Patient verbalized understanding.

## 2018-12-28 NOTE — Progress Notes (Signed)
I agree with the above note, plan 

## 2018-12-31 ENCOUNTER — Other Ambulatory Visit: Payer: Self-pay | Admitting: *Deleted

## 2018-12-31 ENCOUNTER — Other Ambulatory Visit: Payer: Self-pay | Admitting: Physician Assistant

## 2018-12-31 DIAGNOSIS — K7460 Unspecified cirrhosis of liver: Secondary | ICD-10-CM

## 2018-12-31 DIAGNOSIS — R188 Other ascites: Secondary | ICD-10-CM

## 2019-01-02 ENCOUNTER — Encounter (HOSPITAL_COMMUNITY)
Admission: RE | Admit: 2019-01-02 | Discharge: 2019-01-02 | Disposition: A | Discharge status: Home / Self Care | Payer: Medicare HMO | Source: Ambulatory Visit | Attending: Nephrology | Admitting: Nephrology

## 2019-01-02 ENCOUNTER — Encounter (HOSPITAL_COMMUNITY): Payer: Self-pay

## 2019-01-02 ENCOUNTER — Other Ambulatory Visit: Payer: Self-pay

## 2019-01-02 LAB — POCT I-STAT 4, (NA,K, GLUC, HGB,HCT)
Glucose, Bld: 279 mg/dL — ABNORMAL HIGH (ref 70–99)
HCT: 26 % — ABNORMAL LOW (ref 36.0–46.0)
Hemoglobin: 8.8 g/dL — ABNORMAL LOW (ref 12.0–15.0)
Potassium: 4.3 mmol/L (ref 3.5–5.1)
Sodium: 137 mmol/L (ref 135–145)

## 2019-01-02 MED ORDER — EPOETIN ALFA-EPBX 10000 UNIT/ML IJ SOLN
10000.0000 [IU] | INTRAMUSCULAR | Status: DC
  Administered 2019-01-02: 10000 [IU] via SUBCUTANEOUS
  Filled 2019-01-02: qty 1

## 2019-01-05 ENCOUNTER — Emergency Department (HOSPITAL_COMMUNITY): Payer: Medicare HMO

## 2019-01-05 ENCOUNTER — Inpatient Hospital Stay (HOSPITAL_COMMUNITY): Payer: Medicare HMO

## 2019-01-05 ENCOUNTER — Other Ambulatory Visit: Payer: Self-pay

## 2019-01-05 ENCOUNTER — Encounter (HOSPITAL_COMMUNITY): Payer: Self-pay

## 2019-01-05 DIAGNOSIS — I272 Pulmonary hypertension, unspecified: Secondary | ICD-10-CM | POA: Diagnosis present

## 2019-01-05 DIAGNOSIS — R7989 Other specified abnormal findings of blood chemistry: Secondary | ICD-10-CM

## 2019-01-05 DIAGNOSIS — Z794 Long term (current) use of insulin: Secondary | ICD-10-CM

## 2019-01-05 DIAGNOSIS — E1122 Type 2 diabetes mellitus with diabetic chronic kidney disease: Secondary | ICD-10-CM | POA: Diagnosis present

## 2019-01-05 DIAGNOSIS — R079 Chest pain, unspecified: Secondary | ICD-10-CM | POA: Diagnosis not present

## 2019-01-05 DIAGNOSIS — I10 Essential (primary) hypertension: Secondary | ICD-10-CM | POA: Diagnosis present

## 2019-01-05 DIAGNOSIS — E785 Hyperlipidemia, unspecified: Secondary | ICD-10-CM | POA: Diagnosis present

## 2019-01-05 DIAGNOSIS — E871 Hypo-osmolality and hyponatremia: Secondary | ICD-10-CM | POA: Diagnosis not present

## 2019-01-05 DIAGNOSIS — E039 Hypothyroidism, unspecified: Secondary | ICD-10-CM | POA: Diagnosis present

## 2019-01-05 DIAGNOSIS — Z801 Family history of malignant neoplasm of trachea, bronchus and lung: Secondary | ICD-10-CM | POA: Diagnosis not present

## 2019-01-05 DIAGNOSIS — D72829 Elevated white blood cell count, unspecified: Secondary | ICD-10-CM | POA: Diagnosis present

## 2019-01-05 DIAGNOSIS — Z7982 Long term (current) use of aspirin: Secondary | ICD-10-CM | POA: Diagnosis not present

## 2019-01-05 DIAGNOSIS — K7460 Unspecified cirrhosis of liver: Secondary | ICD-10-CM | POA: Diagnosis present

## 2019-01-05 DIAGNOSIS — D649 Anemia, unspecified: Secondary | ICD-10-CM | POA: Diagnosis present

## 2019-01-05 DIAGNOSIS — I5043 Acute on chronic combined systolic (congestive) and diastolic (congestive) heart failure: Secondary | ICD-10-CM | POA: Diagnosis present

## 2019-01-05 DIAGNOSIS — N185 Chronic kidney disease, stage 5: Secondary | ICD-10-CM | POA: Diagnosis not present

## 2019-01-05 DIAGNOSIS — Z79890 Hormone replacement therapy (postmenopausal): Secondary | ICD-10-CM

## 2019-01-05 DIAGNOSIS — I5030 Unspecified diastolic (congestive) heart failure: Secondary | ICD-10-CM | POA: Diagnosis present

## 2019-01-05 DIAGNOSIS — I132 Hypertensive heart and chronic kidney disease with heart failure and with stage 5 chronic kidney disease, or end stage renal disease: Secondary | ICD-10-CM | POA: Diagnosis present

## 2019-01-05 DIAGNOSIS — F329 Major depressive disorder, single episode, unspecified: Secondary | ICD-10-CM | POA: Diagnosis present

## 2019-01-05 DIAGNOSIS — Z20828 Contact with and (suspected) exposure to other viral communicable diseases: Secondary | ICD-10-CM | POA: Diagnosis present

## 2019-01-05 DIAGNOSIS — Z800 Family history of malignant neoplasm of digestive organs: Secondary | ICD-10-CM

## 2019-01-05 DIAGNOSIS — N184 Chronic kidney disease, stage 4 (severe): Secondary | ICD-10-CM

## 2019-01-05 DIAGNOSIS — I5032 Chronic diastolic (congestive) heart failure: Secondary | ICD-10-CM

## 2019-01-05 DIAGNOSIS — N179 Acute kidney failure, unspecified: Secondary | ICD-10-CM | POA: Diagnosis not present

## 2019-01-05 DIAGNOSIS — I214 Non-ST elevation (NSTEMI) myocardial infarction: Secondary | ICD-10-CM | POA: Diagnosis not present

## 2019-01-05 DIAGNOSIS — Z8249 Family history of ischemic heart disease and other diseases of the circulatory system: Secondary | ICD-10-CM | POA: Diagnosis not present

## 2019-01-05 DIAGNOSIS — M329 Systemic lupus erythematosus, unspecified: Secondary | ICD-10-CM | POA: Diagnosis present

## 2019-01-05 DIAGNOSIS — I2510 Atherosclerotic heart disease of native coronary artery without angina pectoris: Secondary | ICD-10-CM | POA: Diagnosis present

## 2019-01-05 DIAGNOSIS — F419 Anxiety disorder, unspecified: Secondary | ICD-10-CM | POA: Diagnosis present

## 2019-01-05 DIAGNOSIS — E119 Type 2 diabetes mellitus without complications: Secondary | ICD-10-CM

## 2019-01-05 DIAGNOSIS — Z515 Encounter for palliative care: Secondary | ICD-10-CM | POA: Diagnosis not present

## 2019-01-05 DIAGNOSIS — N289 Disorder of kidney and ureter, unspecified: Secondary | ICD-10-CM

## 2019-01-05 DIAGNOSIS — J9811 Atelectasis: Secondary | ICD-10-CM | POA: Diagnosis present

## 2019-01-05 DIAGNOSIS — Z66 Do not resuscitate: Secondary | ICD-10-CM | POA: Diagnosis not present

## 2019-01-05 DIAGNOSIS — M069 Rheumatoid arthritis, unspecified: Secondary | ICD-10-CM | POA: Diagnosis present

## 2019-01-05 LAB — TROPONIN I (HIGH SENSITIVITY)
Troponin I (High Sensitivity): 2671 ng/L (ref <18)
Troponin I (High Sensitivity): 2325 ng/L (ref <18)
Troponin I (High Sensitivity): 2099 ng/L (ref <18)
Troponin I (High Sensitivity): 1900 ng/L (ref <18)

## 2019-01-05 LAB — BASIC METABOLIC PANEL
Anion gap: 15 (ref 5–15)
BUN: 80 mg/dL — ABNORMAL HIGH (ref 8–23)
CO2: 17 mmol/L — ABNORMAL LOW (ref 22–32)
Calcium: 8.8 mg/dL — ABNORMAL LOW (ref 8.9–10.3)
Chloride: 99 mmol/L (ref 98–111)
Creatinine, Ser: 5.15 mg/dL — ABNORMAL HIGH (ref 0.44–1.00)
GFR calc Af Amer: 9 mL/min — ABNORMAL LOW (ref >60)
GFR calc non Af Amer: 8 mL/min — ABNORMAL LOW (ref >60)
Glucose, Bld: 363 mg/dL — ABNORMAL HIGH (ref 70–99)
Potassium: 5 mmol/L (ref 3.5–5.1)
Sodium: 131 mmol/L — ABNORMAL LOW (ref 135–145)

## 2019-01-05 LAB — GLUCOSE, CAPILLARY
Glucose-Capillary: 368 mg/dL — ABNORMAL HIGH (ref 70–99)
Glucose-Capillary: 295 mg/dL — ABNORMAL HIGH (ref 70–99)

## 2019-01-05 LAB — CBC
HCT: 28.2 % — ABNORMAL LOW (ref 36.0–46.0)
Hemoglobin: 8.9 g/dL — ABNORMAL LOW (ref 12.0–15.0)
MCH: 29.7 pg (ref 26.0–34.0)
MCHC: 31.6 g/dL (ref 30.0–36.0)
MCV: 94 fL (ref 80.0–100.0)
Platelets: 131 10*3/uL — ABNORMAL LOW (ref 150–400)
RBC: 3 MIL/uL — ABNORMAL LOW (ref 3.87–5.11)
RDW: 14.6 % (ref 11.5–15.5)
WBC: 12.9 10*3/uL — ABNORMAL HIGH (ref 4.0–10.5)
nRBC: 0 % (ref 0.0–0.2)

## 2019-01-05 LAB — T4, FREE: Free T4: 0.6 ng/dL — ABNORMAL LOW (ref 0.61–1.12)

## 2019-01-05 LAB — MAGNESIUM: Magnesium: 2.7 mg/dL — ABNORMAL HIGH (ref 1.7–2.4)

## 2019-01-05 LAB — LACTIC ACID, PLASMA: Lactic Acid, Venous: 1.5 mmol/L (ref 0.5–1.9)

## 2019-01-05 LAB — TSH: TSH: 3.611 u[IU]/mL (ref 0.350–4.500)

## 2019-01-05 LAB — HEMOGLOBIN A1C
Hgb A1c MFr Bld: 7.9 % — ABNORMAL HIGH (ref 4.8–5.6)
Mean Plasma Glucose: 180.03 mg/dL

## 2019-01-05 LAB — ECHOCARDIOGRAM COMPLETE

## 2019-01-05 MED ORDER — ENSURE ENLIVE PO LIQD
237.0000 mL | Freq: Three times a day (TID) | ORAL | Status: DC
  Administered 2019-01-05 – 2019-01-06 (×3): 237 mL via ORAL

## 2019-01-05 MED ORDER — SODIUM CHLORIDE 0.45 % IV SOLN
INTRAVENOUS | Status: DC
  Administered 2019-01-06: via INTRAVENOUS

## 2019-01-05 MED ORDER — HEPARIN (PORCINE) 25000 UT/250ML-% IV SOLN
1300.0000 [IU]/h | INTRAVENOUS | Status: DC
  Administered 2019-01-05: 17:00:00 850 [IU]/h via INTRAVENOUS
  Filled 2019-01-05: qty 250

## 2019-01-05 MED ORDER — NITROGLYCERIN 0.4 MG SL SUBL: 0.4000 mg | SUBLINGUAL_TABLET | SUBLINGUAL | Status: DC | PRN

## 2019-01-05 MED ORDER — ALBUTEROL SULFATE (2.5 MG/3ML) 0.083% IN NEBU: 3.0000 mL | INHALATION_SOLUTION | RESPIRATORY_TRACT | Status: DC | PRN

## 2019-01-05 MED ORDER — FERROUS SULFATE 325 (65 FE) MG PO TABS
325.0000 mg | ORAL_TABLET | Freq: Two times a day (BID) | ORAL | Status: DC
  Administered 2019-01-06: 08:00:00 325 mg via ORAL
  Filled 2019-01-05: qty 1

## 2019-01-05 MED ORDER — GABAPENTIN 100 MG PO CAPS: 100.0000 mg | ORAL_CAPSULE | Freq: Every day | ORAL | Status: DC

## 2019-01-05 MED ORDER — QUETIAPINE FUMARATE 25 MG PO TABS
25.0000 mg | ORAL_TABLET | Freq: Two times a day (BID) | ORAL | Status: DC
  Administered 2019-01-05 – 2019-01-06 (×3): 25 mg via ORAL
  Filled 2019-01-05 (×3): qty 1

## 2019-01-05 MED ORDER — AMLODIPINE BESYLATE 2.5 MG PO TABS
2.5000 mg | ORAL_TABLET | Freq: Two times a day (BID) | ORAL | Status: DC
  Administered 2019-01-06: 09:00:00 2.5 mg via ORAL
  Filled 2019-01-05: qty 1

## 2019-01-05 MED ORDER — SALINE SPRAY 0.65 % NA SOLN
1.0000 | NASAL | Status: DC | PRN
  Filled 2019-01-05: qty 44

## 2019-01-05 MED ORDER — ATORVASTATIN CALCIUM 80 MG PO TABS: 80.0000 mg | ORAL_TABLET | Freq: Every day | ORAL | Status: DC

## 2019-01-05 MED ORDER — VITAMIN D (ERGOCALCIFEROL) 1.25 MG (50000 UNIT) PO CAPS: 50000.0000 [IU] | ORAL_CAPSULE | ORAL | Status: DC

## 2019-01-05 MED ORDER — HYDRALAZINE HCL 50 MG PO TABS
100.0000 mg | ORAL_TABLET | Freq: Three times a day (TID) | ORAL | Status: DC
  Administered 2019-01-05: 100 mg via ORAL
  Filled 2019-01-05: qty 2

## 2019-01-05 MED ORDER — OXYCODONE HCL 5 MG PO TABS
5.0000 mg | ORAL_TABLET | Freq: Four times a day (QID) | ORAL | Status: DC | PRN
  Administered 2019-01-05: 20:00:00 5 mg via ORAL
  Filled 2019-01-05: qty 1

## 2019-01-05 MED ORDER — HYDROXYZINE HCL 10 MG PO TABS
10.0000 mg | ORAL_TABLET | Freq: Two times a day (BID) | ORAL | Status: DC | PRN
  Administered 2019-01-05: 10 mg via ORAL
  Filled 2019-01-05 (×2): qty 1

## 2019-01-05 MED ORDER — CARVEDILOL 6.25 MG PO TABS
6.2500 mg | ORAL_TABLET | Freq: Two times a day (BID) | ORAL | Status: DC
  Administered 2019-01-06: 08:00:00 6.25 mg via ORAL
  Filled 2019-01-05: qty 1

## 2019-01-05 MED ORDER — NITROGLYCERIN 0.4 MG SL SUBL
0.4000 mg | SUBLINGUAL_TABLET | SUBLINGUAL | Status: DC | PRN
  Filled 2019-01-05: qty 1

## 2019-01-05 MED ORDER — TRAZODONE HCL 100 MG PO TABS
100.0000 mg | ORAL_TABLET | Freq: Every day | ORAL | Status: DC
  Administered 2019-01-06: 21:00:00 100 mg via ORAL
  Filled 2019-01-05 (×2): qty 1

## 2019-01-05 MED ORDER — ISOSORBIDE MONONITRATE ER 30 MG PO TB24
30.0000 mg | ORAL_TABLET | Freq: Every day | ORAL | Status: DC
  Administered 2019-01-06: 30 mg via ORAL
  Filled 2019-01-05: qty 1

## 2019-01-05 MED ORDER — SODIUM CHLORIDE 0.9% FLUSH
3.0000 mL | Freq: Once | INTRAVENOUS | Status: AC
  Administered 2019-01-05: 17:00:00 3 mL via INTRAVENOUS

## 2019-01-05 MED ORDER — LEVOTHYROXINE SODIUM 25 MCG PO TABS
25.0000 ug | ORAL_TABLET | Freq: Every day | ORAL | Status: DC
  Administered 2019-01-06: 06:00:00 25 ug via ORAL
  Filled 2019-01-05: qty 1

## 2019-01-05 MED ORDER — ASPIRIN 81 MG PO CHEW: 324.0000 mg | CHEWABLE_TABLET | ORAL | Status: DC

## 2019-01-05 MED ORDER — HYDRALAZINE HCL 25 MG PO TABS
25.0000 mg | ORAL_TABLET | Freq: Three times a day (TID) | ORAL | Status: DC
  Administered 2019-01-06: 25 mg via ORAL
  Filled 2019-01-05: qty 1

## 2019-01-05 MED ORDER — ASPIRIN EC 81 MG PO TBEC
81.0000 mg | DELAYED_RELEASE_TABLET | Freq: Every day | ORAL | Status: DC
  Administered 2019-01-06: 09:00:00 81 mg via ORAL
  Filled 2019-01-05: qty 1

## 2019-01-05 MED ORDER — ONDANSETRON 4 MG PO TBDP
8.0000 mg | ORAL_TABLET | Freq: Every day | ORAL | Status: DC | PRN
  Administered 2019-01-06: 21:00:00 8 mg via ORAL
  Filled 2019-01-05 (×3): qty 2

## 2019-01-05 MED ORDER — ASPIRIN 81 MG PO CHEW
324.0000 mg | CHEWABLE_TABLET | Freq: Once | ORAL | Status: AC
  Administered 2019-01-05: 324 mg via ORAL
  Filled 2019-01-05: qty 4

## 2019-01-05 MED ORDER — ASPIRIN 300 MG RE SUPP: 300.0000 mg | RECTAL | Status: DC

## 2019-01-05 MED ORDER — AMLODIPINE BESYLATE 5 MG PO TABS
5.0000 mg | ORAL_TABLET | Freq: Two times a day (BID) | ORAL | Status: DC
  Administered 2019-01-05: 22:00:00 5 mg via ORAL
  Filled 2019-01-05: qty 1

## 2019-01-05 MED ORDER — ASPIRIN EC 81 MG PO TBEC: 81.0000 mg | DELAYED_RELEASE_TABLET | Freq: Every day | ORAL | Status: DC

## 2019-01-05 MED ORDER — SODIUM CHLORIDE 0.9 % IV SOLN: INTRAVENOUS | Status: DC

## 2019-01-05 MED ORDER — INSULIN ASPART 100 UNIT/ML ~~LOC~~ SOLN
0.0000 [IU] | Freq: Three times a day (TID) | SUBCUTANEOUS | Status: DC
  Administered 2019-01-05: 20:00:00 9 [IU] via SUBCUTANEOUS
  Administered 2019-01-06: 18:00:00 3 [IU] via SUBCUTANEOUS
  Administered 2019-01-06: 12:00:00 5 [IU] via SUBCUTANEOUS
  Administered 2019-01-06: 08:00:00 3 [IU] via SUBCUTANEOUS

## 2019-01-05 MED ORDER — SERTRALINE HCL 100 MG PO TABS
200.0000 mg | ORAL_TABLET | Freq: Every day | ORAL | Status: DC
  Administered 2019-01-06: 09:00:00 200 mg via ORAL
  Filled 2019-01-05: qty 2

## 2019-01-05 MED ORDER — ONDANSETRON HCL 4 MG/2ML IJ SOLN
4.0000 mg | Freq: Four times a day (QID) | INTRAMUSCULAR | Status: DC | PRN
  Filled 2019-01-05: qty 2

## 2019-01-05 MED ORDER — ACETAMINOPHEN 325 MG PO TABS
650.0000 mg | ORAL_TABLET | ORAL | Status: DC | PRN
  Administered 2019-01-06 (×2): 650 mg via ORAL
  Filled 2019-01-05 (×2): qty 2

## 2019-01-05 MED ORDER — HEPARIN BOLUS VIA INFUSION
3800.0000 [IU] | Freq: Once | INTRAVENOUS | Status: AC
  Administered 2019-01-05: 17:00:00 3800 [IU] via INTRAVENOUS
  Filled 2019-01-05: qty 3800

## 2019-01-05 MED ORDER — CALCITRIOL 0.5 MCG PO CAPS
0.5000 ug | ORAL_CAPSULE | Freq: Every day | ORAL | Status: DC
  Administered 2019-01-06: 09:00:00 0.5 ug via ORAL
  Filled 2019-01-05: qty 1

## 2019-01-05 MED ORDER — CARVEDILOL 12.5 MG PO TABS
12.5000 mg | ORAL_TABLET | Freq: Two times a day (BID) | ORAL | Status: DC
  Administered 2019-01-05: 20:00:00 12.5 mg via ORAL
  Filled 2019-01-05: qty 1

## 2019-01-05 MED ORDER — HEPARIN SODIUM (PORCINE) 5000 UNIT/ML IJ SOLN: 60.0000 [IU]/kg | Freq: Once | INTRAMUSCULAR | Status: DC

## 2019-01-05 NOTE — ED Notes (Addendum)
ED TO INPATIENT HANDOFF REPORT  ED Nurse Name and Phone #:   S Name/Age/Gender Stacy Smith 73 y.o. female Room/Bed: 015C/015C  Code Status   Code Status: Prior  Home/SNF/Other Home Patient oriented to: self, place and time Is this baseline? Yes   Triage Complete: Triage complete  Chief Complaint weak  Triage Note Pt reports sob, fatigue and leg weakness for the past few days. Pt usually walks with a walker but she has been having more difficulty lately. Oxygen saturation 88% on room air, pt placed on 2L [place]. Pt pale, ill appearing in triage.   Allergies Allergies  Allergen Reactions  . Phenytoin Sodium Extended Itching  . Potassium-Containing Compounds Other (See Comments)    unspecified  . Latex Swelling    Level of Care/Admitting Diagnosis ED Disposition    ED Disposition Condition [facility]: [place] [[id number]]  Level of Care: Progressive [102]  Covid Evaluation: Asymptomatic Screening Protocol (No Symptoms)  Diagnosis: Chest pain [id number]  Admitting Physician: Cristal Ford [id number]  Attending Physician: Cristal Ford [phone]  Estimated length of stay: past midnight tomorrow  Certification:: I certify this patient will need inpatient services for at least 2 midnights  PT Class (Do Not Modify): Inpatient [101]  PT Acc Code (Do Not Modify): Private [1]       B Medical/Surgery History Past Medical History:  Diagnosis Date  . Anxiety   . Bradycardia   . CHF (congestive heart failure) ([place])   . Chronic kidney disease    CKD Stage 3  . Depression   . Diabetes mellitus without complication ([place])   . Hyperkalemia   . Hypertension   . Lupus ([place])   . Peripheral neuropathy   . Rheumatoid arthritis ([place])   . Seizures ([place])   . Shortness of breath dyspnea    Past Surgical History:  Procedure Laterality Date  . ABDOMINAL HYSTERECTOMY    . APPENDECTOMY    . CARDIAC CATHETERIZATION N/A 05/18/2016    Procedure: Right Heart Cath;  Surgeon: Burnell Blanks, MD;  Location: [facility];  Service: Cardiovascular;  Laterality: N/A;  . CYST EXCISION    . DENTAL SURGERY  01/18/15  . RIGHT HEART CATH N/A 06/12/2017   Procedure: RIGHT HEART CATH;  Surgeon: Larey Dresser, MD;  Location: [facility];  Service: Cardiovascular;  Laterality: N/A;     A IV Location/Drains/Wounds Patient Lines/Drains/Airways Status   Active Line/Drains/Airways    Name:   Placement date:   Placement time:   Site:   Days:   Peripheral IV 12/25/2018 Posterior;Right Forearm   12/27/2018    1635    Forearm   less than 1          Intake/Output Last 24 hours No intake or output data in the 24 hours ending 01/06/2019 1743  Labs/Imaging Results for orders placed or performed during the hospital encounter of 01/11/2019 (from the past 48 hour(s))  Basic metabolic panel     Status: Abnormal   Collection Time: 12/23/2018  2:01 PM  Result Value Ref Range   Sodium 131 (L) 135 - 145 mmol/L   Potassium 5.0 3.5 - 5.1 mmol/L   Chloride 99 98 - 111 mmol/L   CO2 17 (L) 22 - 32 mmol/L   Glucose, Bld 363 (H) 70 - 99 mg/dL   BUN 80 (H) 8 - 23 mg/dL   Creatinine, Ser 5.15 (H) 0.44 - 1.00 mg/dL   Calcium  8.8 (L) 8.9 - 10.3 mg/dL   GFR calc non Af Amer 8 (L) >60 mL/min   GFR calc Af Amer 9 (L) >60 mL/min   Anion gap 15 5 - 15    Comment: Performed at [facility] [address], [place], [place] [address]  CBC     Status: Abnormal   Collection Time: 01/04/2019  2:01 PM  Result Value Ref Range   WBC 12.9 (H) 4.0 - 10.5 K/uL   RBC 3.00 (L) 3.87 - 5.11 MIL/uL   Hemoglobin 8.9 (L) 12.0 - 15.0 g/dL   HCT 28.2 (L) 36.0 - 46.0 %   MCV 94.0 80.0 - 100.0 fL   MCH 29.7 26.0 - 34.0 pg   MCHC 31.6 30.0 - 36.0 g/dL   RDW 14.6 11.5 - 15.5 %   Platelets 131 (L) 150 - 400 K/uL   nRBC 0.0 0.0 - 0.2 %    Comment: Performed at [facility], [place] [address], [place], [place] [address]  Troponin I (High Sensitivity)      Status: Abnormal   Collection Time: 12/19/2018  2:01 PM  Result Value Ref Range   Troponin I (High Sensitivity) 2,671 (HH) <18 ng/L    Comment: CRITICAL RESULT CALLED TO, READ BACK BY AND VERIFIED WITH: SHORE, L RN @ 1450 ON 12/17/2018 BY TEMOCHE, H (NOTE) Elevated high sensitivity troponin I (hsTnI) values and significant  changes across serial measurements may suggest ACS but many other  chronic and acute conditions are known to elevate hsTnI results.  Refer to the Links section for chest pain algorithms and additional  guidance. Performed at [facility], [place] [address], [place], [place] [address]   Lactic acid, plasma     Status: None   Collection Time: 12/24/2018  2:01 PM  Result Value Ref Range   Lactic Acid, Venous 1.5 0.5 - 1.9 mmol/L    Comment: Performed at [facility] [address], [place], [place] [address]  Troponin I (High Sensitivity)     Status: Abnormal   Collection Time: 12/22/2018  3:45 PM  Result Value Ref Range   Troponin I (High Sensitivity) 2,325 (HH) <18 ng/L    Comment: CRITICAL VALUE NOTED.  VALUE IS CONSISTENT WITH PREVIOUSLY REPORTED AND CALLED VALUE. (NOTE) Elevated high sensitivity troponin I (hsTnI) values and significant  changes across serial measurements may suggest ACS but many other  chronic and acute conditions are known to elevate hsTnI results.  Refer to the Links section for chest pain algorithms and additional  guidance. Performed at [facility], [place] [address], [place], [place] [address]    Dg Chest 2 View  Result Date: 01/11/2019 CLINICAL DATA:  Chest pain, shortness of breath, left-sided chest pain, recent falls, left shoulder pain EXAM: CHEST - 2 VIEW COMPARISON:  08/06/2017 FINDINGS: Cardiomegaly. Small bilateral pleural effusions with associated atelectasis or consolidation. The visualized skeletal structures are unremarkable. IMPRESSION: 1. Cardiomegaly. Small bilateral pleural effusions with associated atelectasis  or consolidation. 2. No obvious abnormality of the partially included left shoulder. Recommend dedicated shoulder radiographs if there is suspicion for fracture. Electronically Signed   By: Eddie Candle M.D.   On: 01/06/2019 14:40    Pending Labs Unresulted Labs (From admission, onward)    Start     Ordered   January 17, 2019 0500  Heparin level (unfractionated)  Daily,   R     01/12/2019 1548   01/06/19 0500  CBC  Daily,  R     12/20/2018 1548   01/06/19 0000  Heparin level (unfractionated)  Once-Timed,   STAT     12/30/2018 1548   12/17/2018 1555  Novel Coronavirus, NAA (hospital order; send-out to ref lab)  (Symptomatic/High Risk of Exposure/Tier 1 Patients Labs with Precautions)  Once,   STAT    Question Answer Comment  Is this test for diagnosis or screening Diagnosis of ill patient   Symptomatic for COVID-19 as defined by CDC Yes   Date of Symptom Onset 01/02/2019   Hospitalized for COVID-19 No   Admitted to ICU for COVID-19 No   Previously tested for COVID-19 No   Resident in a congregate (group) care setting No   Employed in healthcare setting No   Pregnant No      01/10/2019 1554   Signed and Held  Hemoglobin A1c  Once,   R    Comments: To assess prior glycemic control    Signed and Held   Signed and Held  Magnesium  Once,   R     Signed and Held   Signed and Held  T4, free  Once,   R     Signed and Held   Signed and Held  TSH  Once,   R     Signed and Held   Signed and Held  Hemoglobin A1c  Once,   R     Signed and Held   Signed and Held  Basic metabolic panel  Tomorrow morning,   R     Signed and Held   Signed and Held  Lipid panel  Tomorrow morning,   R     Signed and Held          Vitals/Pain Today's Vitals   12/23/2018 1645 01/14/2019 1700 12/15/2018 1715 01/04/2019 1730  BP: 127/66 (!) 119/46 (!) 114/59 (!) 113/56  Pulse: 70 70 70 70  Resp: 20 18 19  (!) 22  Temp:      TempSrc:      SpO2: 97% 99% 97% 98%    Isolation Precautions No active  isolations  Medications Medications  nitroGLYCERIN (NITROSTAT) SL tablet 0.4 mg (has no administration in time range)  heparin ADULT infusion 100 units/mL (25000 units/211mL sodium chloride 0.45%) (850 Units/hr Intravenous New Bag/Given 12/20/2018 1636)  0.9 %  sodium chloride infusion (has no administration in time range)  sodium chloride flush (NS) 0.9 % injection 3 mL (3 mLs Intravenous Given 12/15/2018 1637)  aspirin chewable tablet 324 mg (324 mg Oral Given 01/04/2019 1629)  heparin bolus via infusion 3,800 Units (3,800 Units Intravenous Bolus from Bag 12/26/2018 1636)    Mobility walks with person assist High fall risk   Focused Assessments Pulmonary Assessment Handoff:  Lung sounds: L Breath Sounds: Expiratory wheezes R Breath Sounds: Expiratory wheezes O2 Device: Nasal Cannula O2 Flow Rate (L/min): 2 L/min      R Recommendations: See Admitting Provider Note  Report given to:   Additional Notes:  Pt presented with progressive chest pain, SOB, and leg weakness for the last several days. Reports it worsens with deep inspiration and that she did not take anything for relief. She states that the chest pain began randomly while she was sitting and resting with no exertion. CXR with bilateral pleural effusions, appears fairly stable. Creatinine, 5.1 today>>was previously 3.00 on 12/27/2018

## 2019-01-05 NOTE — Progress Notes (Signed)
ANTICOAGULATION CONSULT NOTE - Initial Consult  Pharmacy Consult for heparin Indication: chest pain/ACS  Allergies  Allergen Reactions  . Phenytoin Sodium Extended Itching  . Potassium-Containing Compounds Other (See Comments)    unspecified  . Latex Swelling    Patient Measurements:   Heparin Dosing Weight: 62.8kg  Vital Signs: Temp: 99.5 F (37.5 C) (08/22 1357) Temp Source: Oral (08/22 1357) BP: 120/64 (08/22 1530) Pulse Rate: 73 (08/22 1530)  Labs: Recent Labs    12/27/2018 1401  HGB 8.9*  HCT 28.2*  PLT 131*  CREATININE 5.15*  TROPONINIHS 2,671*    Estimated Creatinine Clearance: 8.5 mL/min (A) (by C-G formula based on SCr of 5.15 mg/dL (H)).   Medical History: Past Medical History:  Diagnosis Date  . Anxiety   . Bradycardia   . CHF (congestive heart failure) ([place])   . Chronic kidney disease    CKD Stage 3  . Depression   . Diabetes mellitus without complication ([place])   . Hyperkalemia   . Hypertension   . Lupus ([place])   . Peripheral neuropathy   . Rheumatoid arthritis ([place])   . Seizures ([place])   . Shortness of breath dyspnea     Medications:  Infusions:  . heparin      Assessment: 72 yof presented to the ED with SOB and fatigue. Troponin elevated and now starting IV heparin. Baseline Hgb and platelets are low. No bleeding noted. She is not on anticoagulation PTA.  Goal of Therapy:  Heparin level 0.3-0.7 units/ml Monitor platelets by anticoagulation protocol: Yes   Plan:  Heparin bolus 3800 units IV x 1 Heparin gtt 850 units/hr Check an 8 hr heparin level Daily heparin level and CBC  Bette Brienza, Rande Lawman 01/04/2019,3:49 PM

## 2019-01-05 NOTE — ED Provider Notes (Signed)
[facility] EMERGENCY DEPARTMENT Provider Note   CSN: [id number] Arrival date & time: 01/10/2019  1325     History   Chief Complaint Chief Complaint  Patient presents with  . Shortness of Breath  . Weakness    HPI Stacy Smith is a 73 y.o. female with history of stage IV CKD, diastolic CHF, pulmonary HTN, HTN, Lupus, and RA presenting to the ED complaining of 3 days of progressively worsening chest pain and dyspnea.  Patient describes her chest pain as a substernal pressure sensation that is nonradiating and severe.  She reports associated worsening shortness of breath since its onset.  She denies any assoicated nausea, vomiting, diaphoresis, or palpitations.  Patient reports she is still making urine.  She is also complaining of diffuse myalgias and some right shoulder pain from a fall a few days ago.  She denies any recent fever, sick contacts, changes to her chronic cough, abdominal pain, diarrhea, or any other complaints.      The history is provided by the patient.    Past Medical History:  Diagnosis Date  . Anxiety   . Bradycardia   . CHF (congestive heart failure) ([place])   . Chronic kidney disease    CKD Stage 3  . Depression   . Diabetes mellitus without complication ([place])   . Hyperkalemia   . Hypertension   . Lupus ([place])   . Peripheral neuropathy   . Rheumatoid arthritis ([place])   . Seizures ([place])   . Shortness of breath dyspnea     Patient Active Problem List   Diagnosis Date Noted  . Chest pain 01/12/2019  . CKD (chronic kidney disease), stage IV ([place]) 09/14/2017  . Sinus bradycardia 09/14/2017  . Anasarca 08/06/2017  . Cirrhosis ([place]) 07/20/2017  . Diastolic CHF ([place]) [id number]  . 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 Hyponatremia 06/10/2017  . Iron deficiency anemia due to chronic blood loss 06/10/2017  . Major depression, recurrent, chronic ([place]) 06/10/2017  . Pulmonary HTN ([place])   . Seizures ([place])    . Delirium   . Diabetes type 2, uncontrolled ([place])   . Demand ischemia ([place])   . Sinus tachycardia   . Ischemic chest pain ([place])   . Diabetes type 2, controlled ([place])   . Convulsion ([place])   . Altered mental state   . HLD (hyperlipidemia)   . Rheumatoid arthritis ([place])   . Lupus (systemic lupus erythematosus) ([place])   . Hypokalemia   . Hypomagnesemia   . Convulsions/seizures ([place]) 01/22/2015  . Diabetes ([place]) 01/22/2015  . Essential hypertension 01/22/2015  . Hyperlipidemia 01/22/2015  . Seizure ([place]) 01/22/2015    Past Surgical History:  Procedure Laterality Date  . ABDOMINAL HYSTERECTOMY    . APPENDECTOMY    . CARDIAC CATHETERIZATION N/A 05/18/2016   Procedure: Right Heart Cath;  Surgeon: Burnell Blanks, MD;  Location: [facility];  Service: Cardiovascular;  Laterality: N/A;  . CYST EXCISION    . DENTAL SURGERY  01/18/15  . RIGHT HEART CATH N/A 06/12/2017   Procedure: RIGHT HEART CATH;  Surgeon: Larey Dresser, MD;  Location: [facility];  Service: Cardiovascular;  Laterality: N/A;     OB History   No obstetric history on file.      Home Medications    Prior to Admission medications   Medication Sig Start Date End Date Taking? Authorizing Provider  amLODipine (NORVASC) 5 MG tablet Take 5 mg by mouth  2 (two) times daily.    Yes [provider]  aspirin EC 81 MG tablet Take 81 mg by mouth daily.   Yes [provider]  atorvastatin (LIPITOR) 80 MG tablet Take 1 tablet (80 mg total) by mouth daily at 6 PM. 06/19/17  Yes Sheikh, Omair Latif, DO  calcitRIOL (ROCALTROL) 0.25 MCG capsule Take 0.5 mcg by mouth daily.    Yes [provider]  carvedilol (COREG) 12.5 MG tablet TAKE ONE TABLET BY MOUTH TWICE A DAY WITH MEALS Patient taking differently: Take 12.5 mg by mouth 2 (two) times daily with a meal.  10/22/18  Yes Larey Dresser, MD  epoetin alfa-epbx (RETACRIT) 09811 UNIT/ML injection 10,000 Units every 14 (fourteen) days.   Yes  Edrick Oh, MD  feeding supplement, ENSURE ENLIVE, (ENSURE ENLIVE) LIQD Take 237 mLs by mouth 3 (three) times daily between meals. 06/19/17  Yes Sheikh, Omair Latif, DO  ferrous sulfate 325 (65 FE) MG tablet Take 325 mg by mouth 2 (two) times daily with a meal.    Yes [provider]  gabapentin (NEURONTIN) 100 MG capsule Take 100 mg by mouth daily. 12/03/18  Yes [provider]  hydrALAZINE (APRESOLINE) 100 MG tablet Take 1 tablet (100 mg total) by mouth 3 (three) times daily. 08/11/17  Yes Lavina Hamman, MD  hydrOXYzine (ATARAX/VISTARIL) 10 MG tablet Take 10 mg by mouth 2 (two) times daily as needed for anxiety.   Yes [provider]  insulin aspart (NOVOLOG FLEXPEN) 100 UNIT/ML FlexPen Inject 4-8 Units into the skin 3 (three) times daily with meals. Per sliding scale   Yes [provider]  ipratropium-albuterol (DUONEB) 0.5-2.5 (3) MG/3ML SOLN Take 3 mLs by nebulization every 6 (six) hours as needed (for shortness of breath).    Yes [provider]  isosorbide mononitrate (IMDUR) 30 MG 24 hr tablet Take 1 tablet (30 mg total) by mouth daily. 06/20/17  Yes Sheikh, Omair Latif, DO  lactulose (CHRONULAC) 10 GM/15ML solution Take 45 mLs (30 g total) by mouth 2 (two) times daily. Patient taking differently: Take 10 g by mouth 2 (two) times daily.  06/19/17  Yes Sheikh, Omair Latif, DO  levothyroxine (SYNTHROID, LEVOTHROID) 25 MCG tablet Take 25 mcg by mouth daily before breakfast.   Yes [provider]  ondansetron (ZOFRAN-ODT) 8 MG disintegrating tablet Take 8 mg by mouth daily as needed for nausea or vomiting.  11/14/14  Yes [provider]  oxyCODONE (OXY IR/ROXICODONE) 5 MG immediate release tablet Take 5 mg by mouth every 6 (six) hours as needed for severe pain.   Yes [provider]  QUEtiapine (SEROQUEL) 25 MG tablet Take 25 mg by mouth 2 (two) times daily.   Yes [provider]  sertraline (ZOLOFT) 100 MG tablet Take 200  mg by mouth daily.    Yes [provider]  sodium chloride (OCEAN) 0.65 % SOLN nasal spray Place 1 spray into both nostrils as needed for congestion. 06/19/17  Yes Sheikh, Omair Latif, DO  torsemide (DEMADEX) 20 MG tablet TAKE 2 TABLETS BY MOUTH EVERY DAY Patient taking differently: Take 40 mg by mouth daily.  10/05/17  Yes Larey Dresser, MD  traZODone (DESYREL) 100 MG tablet Take 1 tablet (100 mg total) by mouth at bedtime. 06/19/17  Yes Sheikh, Omair Latif, DO  Vitamin D, Ergocalciferol, (DRISDOL) 50000 units CAPS capsule Take 50,000 Units by mouth every 7 (seven) days.   Yes [provider]    Family History Family  History  Problem Relation Age of Onset  . Heart failure Mother   . Liver cancer Father   . Lung cancer Father   . Cancer Sister   . Heart murmur Brother     Social History Social History   Tobacco Use  . Smoking status: Never Smoker  . Smokeless tobacco: Never Used  Substance Use Topics  . Alcohol use: No    Alcohol/week: 0.0 standard drinks  . Drug use: No     Allergies   Phenytoin sodium extended, Potassium-containing compounds, and Latex   Review of Systems Review of Systems  Constitutional: Negative for chills, diaphoresis and fever.  HENT: Negative for ear pain and sore throat.   Eyes: Negative for pain and visual disturbance.  Respiratory: Positive for cough and shortness of breath.   Cardiovascular: Positive for chest pain. Negative for palpitations.  Gastrointestinal: Negative for abdominal pain, nausea and vomiting.  Genitourinary: Negative for decreased urine volume, dysuria and hematuria.  Musculoskeletal: Positive for myalgias. Negative for arthralgias and back pain.  Skin: Negative for color change and rash.  Neurological: Negative for seizures and syncope.  Psychiatric/Behavioral: Negative for agitation and behavioral problems.  All other systems reviewed and are negative.    Physical Exam Updated Vital Signs BP (!)  116/49   Pulse 67   Temp 99.5 F (37.5 C) (Oral)   Resp 14   SpO2 98%   Physical Exam Constitutional:      General: She is in acute distress.     Appearance: She is ill-appearing. She is not diaphoretic.     Comments: Patient is clutching her chest  HENT:     Head: Normocephalic and atraumatic.     Nose: Nose normal. No congestion or rhinorrhea.     Mouth/Throat:     Mouth: Mucous membranes are moist.     Pharynx: Oropharynx is clear. No oropharyngeal exudate or posterior oropharyngeal erythema.  Eyes:     Extraocular Movements: Extraocular movements intact.     Conjunctiva/sclera: Conjunctivae normal.     Pupils: Pupils are equal, round, and reactive to light.  Neck:     Musculoskeletal: Normal range of motion and neck supple. No neck rigidity or muscular tenderness.  Cardiovascular:     Rate and Rhythm: Normal rate and regular rhythm.     Pulses: Normal pulses.     Heart sounds: Normal heart sounds. No murmur. No friction rub. No gallop.   Pulmonary:     Breath sounds: Wheezing present.     Comments: Tachypneic, speaking in short sentences, diffuse expiratory wheezing with prolonged expiratory phase, bibasilar rales Abdominal:     General: Abdomen is flat. There is no distension.     Palpations: Abdomen is soft.     Tenderness: There is no abdominal tenderness. There is no guarding or rebound.  Musculoskeletal: Normal range of motion.        General: No swelling, tenderness, deformity or signs of injury.     Right lower leg: Edema (trace) present.     Left lower leg: Edema (trace) present.  Skin:    General: Skin is warm and dry.  Neurological:     General: No focal deficit present.     Mental Status: She is alert and oriented to person, place, and time. Mental status is at baseline.     Cranial Nerves: No cranial nerve deficit.     Sensory: No sensory deficit.     Motor: No weakness.  Psychiatric:  Mood and Affect: Mood normal.        Behavior: Behavior  normal.      ED Treatments / Results  Labs (all labs ordered are listed, but only abnormal results are displayed) Labs Reviewed  BASIC METABOLIC PANEL - Abnormal; Notable for the following components:      Result Value   Sodium 131 (*)    CO2 17 (*)    Glucose, Bld 363 (*)    BUN 80 (*)    Creatinine, Ser 5.15 (*)    Calcium 8.8 (*)    GFR calc non Af Amer 8 (*)    GFR calc Af Amer 9 (*)    All other components within normal limits  CBC - Abnormal; Notable for the following components:   WBC 12.9 (*)    RBC 3.00 (*)    Hemoglobin 8.9 (*)    HCT 28.2 (*)    Platelets 131 (*)    All other components within normal limits  TROPONIN I (HIGH SENSITIVITY) - Abnormal; Notable for the following components:   Troponin I (High Sensitivity) 2,671 (*)    All other components within normal limits  TROPONIN I (HIGH SENSITIVITY) - Abnormal; Notable for the following components:   Troponin I (High Sensitivity) 2,325 (*)    All other components within normal limits  NOVEL CORONAVIRUS, NAA (HOSPITAL ORDER, SEND-OUT TO REF LAB)  LACTIC ACID, PLASMA  HEPARIN LEVEL (UNFRACTIONATED)  CBC    EKG EKG Interpretation  Date/Time:  Saturday January 05 2019 13:54:01 EDT Ventricular Rate:  74 PR Interval:  154 QRS Duration: 88 QT Interval:  378 QTC Calculation: 419 R Axis:   -19 Text Interpretation:  Normal sinus rhythm Low voltage QRS Abnormal QRS-T angle, consider primary T wave abnormality Abnormal ECG no other acute changes Confirmed by Noemi Chapel [phone]) on 12/18/2018 3:20:47 PM   Radiology Dg Chest 2 View  Result Date: 01/02/2019 CLINICAL DATA:  Chest pain, shortness of breath, left-sided chest pain, recent falls, left shoulder pain EXAM: CHEST - 2 VIEW COMPARISON:  08/06/2017 FINDINGS: Cardiomegaly. Small bilateral pleural effusions with associated atelectasis or consolidation. The visualized skeletal structures are unremarkable. IMPRESSION: 1. Cardiomegaly. Small bilateral pleural  effusions with associated atelectasis or consolidation. 2. No obvious abnormality of the partially included left shoulder. Recommend dedicated shoulder radiographs if there is suspicion for fracture. Electronically Signed   By: Eddie Candle M.D.   On: 12/24/2018 14:40    Procedures Procedures (including critical care time)  Medications Ordered in ED Medications  nitroGLYCERIN (NITROSTAT) SL tablet 0.4 mg (has no administration in time range)  heparin ADULT infusion 100 units/mL (25000 units/274mL sodium chloride 0.45%) (850 Units/hr Intravenous New Bag/Given 01/06/2019 1636)  0.9 %  sodium chloride infusion (has no administration in time range)  sodium chloride flush (NS) 0.9 % injection 3 mL (3 mLs Intravenous Given 01/10/2019 1637)  aspirin chewable tablet 324 mg (324 mg Oral Given 12/22/2018 1629)  heparin bolus via infusion 3,800 Units (3,800 Units Intravenous Bolus from Bag 12/26/2018 1636)     Initial Impression / Assessment and Plan / ED Course  I have reviewed the triage vital signs and the nursing notes.  Pertinent labs & imaging results that were available during my care of the patient were reviewed by me and considered in my medical decision making (see chart for details).        Stacy Smith is a 73 y.o. female with history of stage IV CKD, diastolic CHF, pulmonary HTN, HTN,  Lupus, and RA presenting to the ED complaining of 3 days of progressively worsening chest pain and dyspnea.  On exam, patient is ill-appearing, is clutching her chest, and is tachypneic with diffuse expiratory wheezing.  Initial troponin elevated at 2671.  Labs show acute worsening renal function with creatinine of 5.15 from baseline around 3 with a BUN of 80.  CBC shows mild leukocytosis to 12.9.  EKG shows nonspecific T wave changes in leads I and aVL.  There is no ST elevation.  Chest x-ray shows cardiomegaly with small bilateral pleural effusions.  Presentation concerning for NSTEMI.  Patient was given 324  mg of aspirin and heparin drip was initiated.  Cardiology was consulted and evaluated patient in the ED.  They recommended admission to the hospitalist given patient's multiple other medical issues including acute renal failure.  The hospitalist was consulted and evaluated patient in the ED.  They will admit patient to their service.    Final Clinical Impressions(s) / ED Diagnoses   Final diagnoses:  NSTEMI (non-ST elevated myocardial infarction) ([place])  Acute renal failure superimposed on stage 4 chronic kidney disease, unspecified acute renal failure type [facility])    ED Discharge Orders    None       Candie Chroman, MD 12/31/2018 Penni Bombard    Noemi Chapel, MD 01/06/19 [phone]

## 2019-01-05 NOTE — Consult Note (Addendum)
Cardiology Consultation:   Patient ID: Stacy Smith; [id number]; 08/30/1945   Admit date: 01/04/2019 Date of Consult: 01/13/2019  Primary Care Provider: Jacqualine Code, DO Primary Cardiologist: Dr. Loralie Champagne, MD   Patient Profile:   Stacy Smith is a 72 y.o. female with a hx of CKD stage IV, diastolic CHF, HTN, cirrhosis, DM, depression, and pulmonary hypertension who is being seen today for the evaluation of NSTEMI with acute on chronic CHF exacerbation at the request of Dr. Sabra Heck.  History of Present Illness:   Stacy Smith is a 73yo F with a hx as stated above who presented to [facility] on 12/22/2018 with progressive chest pain, SOB, and leg weakness for the last several days. She is a fairly poor historian even with her husband at bedside. She states that several days ago she began having chest pain with radiation to her left arm with associated SOB. She reports that she has never had chest pain before. Reports it worsens with deep inspiration and that she did not take anything for relief. She states that the chest pain began randomly while she was sitting and resting with no exertional qualities. Her functional baseline appears poor. She denies recent LE swelling, palpitations, dizziness, or syncope. She recently had a fall in which there was no LOC. She denies fever, chills, N/V. Denies COVID exposure. Given her symptoms, she called EMS for transport to the ED for further evaluation.   In the ED, EKG showed NSR with no acute ischemic changes. CXR with small bilateral pleural effusions with associated atelectasis.  She is tachypneic and tachycardiac. Creatinine was found to be 5.15 (last creatinine from 12/27/2018 at 3.00), followed by Dr. Justin Mend. Troponin found to be markedly elevated at 2671. Leukocytosis with WBC of 12.9.   She is followed for her cardiac care by Dr. Aundra Dubin. She was initally admitted 1/26-06/19/16 with acute on chronic diastolic HF in which an echo showed an EF 65-70%  with grade 1 DD and mildly dilated RV. RHC performed showed pulmonary venous hypertension. She was diuresed with IV lasix. Coreg and hydralazine were increased. She was admitted once again 07/2017 with fluid volume overload and AKI. Renal followed along and had initially recommended Cardiomems but with worsening renal function this was not pursed. Lasix change to torsemide at that time.    She was last seen by Dr. Aundra Dubin 06/20/2018 in follow up and was doing reasonably well. Creatinine was noted to be trending upward with most recent reading at 3.29 in 12/19.  BP is high today but she has not taken any meds yet; SBP at home generally runs < 140.  Weight is down 17 lbs. No chest pain.  No dyspnea walking on flat ground.  No orthopnea/PND.    Past Medical History:  Diagnosis Date  . Anxiety   . Bradycardia   . CHF (congestive heart failure) ([place])   . Chronic kidney disease    CKD Stage 3  . Depression   . Diabetes mellitus without complication ([place])   . Hyperkalemia   . Hypertension   . Lupus ([place])   . Peripheral neuropathy   . Rheumatoid arthritis ([place])   . Seizures ([place])   . Shortness of breath dyspnea     Past Surgical History:  Procedure Laterality Date  . ABDOMINAL HYSTERECTOMY    . APPENDECTOMY    . CARDIAC CATHETERIZATION N/A 05/18/2016   Procedure: Right Heart Cath;  Surgeon: Burnell Blanks, MD;  Location: [place] [facility];  Service: Cardiovascular;  Laterality: N/A;  . CYST EXCISION    . DENTAL SURGERY  01/18/15  . RIGHT HEART CATH N/A 06/12/2017   Procedure: RIGHT HEART CATH;  Surgeon: Larey Dresser, MD;  Location: [place] [facility];  Service: Cardiovascular;  Laterality: N/A;     Prior to Admission medications   Medication Sig Start Date End Date Taking? Authorizing Provider  amLODipine (NORVASC) 5 MG tablet Take 5 mg by mouth 2 (two) times daily.     [provider]  aspirin EC 81 MG tablet Take 81 mg by mouth daily.    [provider]   atorvastatin (LIPITOR) 80 MG tablet Take 1 tablet (80 mg total) by mouth daily at 6 PM. 06/19/17   Kerney Elbe, DO  calcitRIOL (ROCALTROL) 0.5 MCG capsule Take 0.5 mcg by mouth every Monday, Wednesday, and Friday.     [provider]  carvedilol (COREG) 12.5 MG tablet TAKE ONE TABLET BY MOUTH TWICE A DAY WITH MEALS 10/22/18   Larey Dresser, MD  carvedilol (COREG) 25 MG tablet Take 0.5 tablets (12.5 mg total) by mouth 2 (two) times daily with a meal. 10/22/18   Larey Dresser, MD  diclofenac sodium (VOLTAREN) 1 % GEL Apply 2 g topically 4 (four) times daily as needed (Pain). 06/19/17   Raiford Noble Latif, DO  docusate sodium (COLACE) 100 MG capsule Take 1 capsule (100 mg total) by mouth 2 (two) times daily. Patient taking differently: Take 200 mg by mouth daily as needed for mild constipation.  01/28/15   Donne Hazel, MD  epoetin alfa-epbx (RETACRIT) 28413 UNIT/ML injection 10,000 Units every 14 (fourteen) days.    Edrick Oh, MD  feeding supplement, ENSURE ENLIVE, (ENSURE ENLIVE) LIQD Take 237 mLs by mouth 3 (three) times daily between meals. 06/19/17   Raiford Noble Latif, DO  ferrous sulfate 325 (65 FE) MG tablet Take 325 mg by mouth 2 (two) times daily with a meal.     [provider]  gabapentin (NEURONTIN) 100 MG capsule Take 100 mg by mouth daily. 12/03/18   [provider]  hydrALAZINE (APRESOLINE) 100 MG tablet Take 1 tablet (100 mg total) by mouth 3 (three) times daily. 08/11/17   Lavina Hamman, MD  insulin aspart (NOVOLOG FLEXPEN) 100 UNIT/ML FlexPen Inject into the skin 3 (three) times daily with meals. Per sliding scale    [provider]  ipratropium-albuterol (DUONEB) 0.5-2.5 (3) MG/3ML SOLN Take 3 mLs by nebulization every 6 (six) hours as needed (for shortness of breath).     [provider]  isosorbide mononitrate (IMDUR) 30 MG 24 hr tablet Take 1 tablet (30 mg total) by mouth daily. 06/20/17   Raiford Noble Latif, DO  lactulose  (CHRONULAC) 10 GM/15ML solution Take 45 mLs (30 g total) by mouth 2 (two) times daily. 06/19/17   Raiford Noble Latif, DO  levothyroxine (SYNTHROID, LEVOTHROID) 25 MCG tablet Take 25 mcg by mouth daily before breakfast.    [provider]  Omega-3 Fatty Acids (OMEGA 3 PO) Take 520 mg by mouth 2 (two) times daily.    [provider]  ondansetron (ZOFRAN-ODT) 8 MG disintegrating tablet Take 8 mg by mouth daily as needed for nausea or vomiting.  11/14/14   [provider]  oxyCODONE (OXY IR/ROXICODONE) 5 MG immediate release tablet Take 5 mg by mouth every 6 (six) hours as needed for severe pain.    [provider]  potassium chloride (K-DUR) 10 MEQ tablet  Take 2 tablets (20 mEq total) by mouth 2 (two) times daily. Take only for 5 days. 12/27/18   Esterwood, Amy S, PA-C  QUEtiapine (SEROQUEL) 25 MG tablet Take 25 mg by mouth 2 (two) times daily.    [provider]  sertraline (ZOLOFT) 50 MG tablet Take 100 mg by mouth daily.     [provider]  sodium chloride (OCEAN) 0.65 % SOLN nasal spray Place 1 spray into both nostrils as needed for congestion. 06/19/17   Raiford Noble Latif, DO  torsemide (DEMADEX) 20 MG tablet TAKE 2 TABLETS BY MOUTH EVERY DAY 10/05/17   Larey Dresser, MD  traZODone (DESYREL) 100 MG tablet Take 1 tablet (100 mg total) by mouth at bedtime. 06/19/17   Raiford Noble Latif, DO  Vitamin D, Ergocalciferol, (DRISDOL) 50000 units CAPS capsule Take 50,000 Units by mouth every 7 (seven) days.    [provider]    Inpatient Medications: Scheduled Meds: . aspirin  324 mg Oral Once  . heparin  3,800 Units Intravenous Once  . sodium chloride flush  3 mL Intravenous Once   Continuous Infusions: . heparin     PRN Meds: nitroGLYCERIN  Allergies:    Allergies  Allergen Reactions  . Phenytoin Sodium Extended Itching  . Potassium-Containing Compounds Other (See Comments)    unspecified  . Latex Swelling    Social History:    Social History   Socioeconomic History  . Marital status: Widowed    Spouse name: Not on file  . Number of children: Not on file  . Years of education: Not on file  . Highest education level: Not on file  Occupational History  . Not on file  Social Needs  . Financial resource strain: Not on file  . Food insecurity    Worry: Not on file    Inability: Not on file  . Transportation needs    Medical: Not on file    Non-medical: Not on file  Tobacco Use  . Smoking status: Never Smoker  . Smokeless tobacco: Never Used  Substance and Sexual Activity  . Alcohol use: No    Alcohol/week: 0.0 standard drinks  . Drug use: No  . Sexual activity: Not on file  Lifestyle  . Physical activity    Days per week: Not on file    Minutes per session: Not on file  . Stress: Not on file  Relationships  . Social Herbalist on phone: Not on file    Gets together: Not on file    Attends religious service: Not on file    Active member of club or organization: Not on file    Attends meetings of clubs or organizations: Not on file    Relationship status: Not on file  . Intimate partner violence    Fear of current or ex partner: Not on file    Emotionally abused: Not on file    Physically abused: Not on file    Forced sexual activity: Not on file  Other Topics Concern  . Not on file  Social History Narrative  . Not on file    Family History:   Family History  Problem Relation Age of Onset  . Heart failure Mother   . Liver cancer Father   . Lung cancer Father   . Cancer Sister   . Heart murmur Brother    Family Status:  Family Status  Relation Name Status  . Mother  Deceased  . Father  Deceased  . Sister  Alive  . Brother  Alive    ROS:  Please see the history of present illness.  All other ROS reviewed and negative.     Physical Exam/Data:   Vitals:   01/04/2019 1357 12/29/2018 1358 12/22/2018 1524 12/31/2018 1530  BP: (!) 132/58  123/89 120/64  Pulse: 76  76 73   Resp: 20  15 18   Temp: 99.5 F (37.5 C)     TempSrc: Oral     SpO2: (!) 88% 99% 93% 91%   No intake or output data in the 24 hours ending 12/23/2018 1557 There were no vitals filed for this visit. There is no height or weight on file to calculate BMI.   General: Ill-appearing, mild distress Neck: Negative for carotid bruits.  Lungs: Wheezing throughout all fields. Breathing is labored. Cardiovascular: RRR with S1 S2. No murmurs Abdomen: Soft, non-tender, distended. No obvious abdominal masses. Extremities: No edema. No clubbing or cyanosis. DP pulses 2+ bilaterally Neuro: Alert and oriented. No focal deficits. No facial asymmetry. MAE spontaneously. Psych: Responds to questions appropriately with normal affect.    EKG:  The EKG was personally reviewed and demonstrates:  01/08/2019 NSR with no acute ischemic changes. Non specific TW abnormalities.  Telemetry:  Telemetry was personally reviewed and demonstrates:  12/27/2018 NSR 80-90s   Relevant CV Studies:  ECHO:  08/07/2017: Study Conclusions  - Left ventricle: The cavity size was normal. Systolic function was   normal. Wall motion was normal; there were no regional wall   motion abnormalities. Features are consistent with a pseudonormal   left ventricular filling pattern, with concomitant abnormal   relaxation and increased filling pressure (grade 2 diastolic   dysfunction). - Left atrium: The atrium was mildly to moderately dilated. - Right ventricle: The cavity size was moderately dilated. Wall   thickness was normal. - Tricuspid valve: There was mild-moderate regurgitation directed   centrally. - Pulmonary arteries: Systolic pressure was moderately increased.   PA peak pressure: 50 mm Hg (S).  CATH: Right heart cath 06/12/2017: Moderate pulmonary venous hypertension.  2. Elevated R > L heart filling pressures 3. Preserved cardiac output.   Laboratory Data:  Chemistry Recent Labs  Lab 01/02/19 1243 12/16/2018 1401   NA 137 131*  K 4.3 5.0  CL  --  99  CO2  --  17*  GLUCOSE 279* 363*  BUN  --  80*  CREATININE  --  5.15*  CALCIUM  --  8.8*  GFRNONAA  --  8*  GFRAA  --  9*  ANIONGAP  --  15    Total Protein  Date Value Ref Range Status  12/27/2018 7.9 6.0 - 8.3 g/dL Final   Albumin  Date Value Ref Range Status  12/27/2018 4.4 3.5 - 5.2 g/dL Final   Albumin Serum  Date Value Ref Range Status  01/23/2015  No Serum Sample Received 3700 - 5100 mg/dl Final    Comment:    Comment TNP     AST  Date Value Ref Range Status  12/27/2018 26 0 - 37 U/L Final   ALT  Date Value Ref Range Status  12/27/2018 20 0 - 35 U/L Final   Alkaline Phosphatase  Date Value Ref Range Status  12/27/2018 70 39 - 117 U/L Final   Total Bilirubin  Date Value Ref Range Status  12/27/2018 0.7 0.2 - 1.2 mg/dL Final   Hematology Recent Labs  Lab 01/02/19 1243 12/28/2018 1401  WBC  --  12.9*  RBC  --  3.00*  HGB 8.8* 8.9*  HCT 26.0* 28.2*  MCV  --  94.0  MCH  --  29.7  MCHC  --  31.6  RDW  --  14.6  PLT  --  131*   Cardiac EnzymesNo results for input(s): TROPONINI in the last 168 hours. No results for input(s): TROPIPOC in the last 168 hours.  BNPNo results for input(s): BNP, PROBNP in the last 168 hours.  DDimer No results for input(s): DDIMER in the last 168 hours. TSH:  Lab Results  Component Value Date   TSH 2.247 01/22/2015   Lipids: Lab Results  Component Value Date   CHOL 116 06/20/2018   HDL 28 (L) 06/20/2018   LDLCALC 51 06/20/2018   TRIG 187 (H) 06/20/2018   CHOLHDL 4.1 06/20/2018   HgbA1c: Lab Results  Component Value Date   HGBA1C 5.5 06/10/2017    Radiology/Studies:  Dg Chest 2 View  Result Date: 01/04/2019 CLINICAL DATA:  Chest pain, shortness of breath, left-sided chest pain, recent falls, left shoulder pain EXAM: CHEST - 2 VIEW COMPARISON:  08/06/2017 FINDINGS: Cardiomegaly. Small bilateral pleural effusions with associated atelectasis or consolidation. The visualized  skeletal structures are unremarkable. IMPRESSION: 1. Cardiomegaly. Small bilateral pleural effusions with associated atelectasis or consolidation. 2. No obvious abnormality of the partially included left shoulder. Recommend dedicated shoulder radiographs if there is suspicion for fracture. Electronically Signed   By: Eddie Candle M.D.   On: 12/25/2018 14:40   Assessment and Plan:   1. NSTEMI: -Pt presented with progressive chest pain, SOB, and leg weakness for the last several days. She is a fairly poor historian even with her husband at bedside. She states that several days ago she began having chest pain with radiation to her left arm with associated SOB. She reports that she has never had chest pain before. Reports it worsens with deep inspiration and that she did not take anything for relief. She states that the chest pain began randomly while she was sitting and resting with no exertional qualities.  -EKG without acute changes, NSR  -HsT elevated at 2671>>consisitent with ACS -CXR with bilateral pleural effusions, however appears fairly stable   -Would obtain echocardiogram to evaluate for acute LV dysfunction with WMA>>may need further ischemic workup however she is currently not currently a candidate for heart cath, stress test or CTA given poor respiratory and renal status with wheezing  -Needs nephrology for fluid management  -Needs nebs for acute wheezing  -Will start IV Nitro gtt for chest pain  -Unclear if IV Hep would be helpful at this time>>defer to MD  -Continue ASA, statin, carvedilol  2. Acute on chronic CHF: -Last echocardiogram from 08/07/2017 with NWMA and G2DD, LV function not described -Presented with SOB -Difficult to determine if an acute ACS event is causing her symtptom with chest pain and elevated HsT -CXR with effusions but no overtly overloaded in appearance  -Obtain echocardiogram for comparison from 07/2017 study -If abnormal, will need ischemic workup at some  point when more stable  -On home demadex 20mg  daily   3. Acute on chronic kidney disease: -Creatinine, 5.1 today>>was previously 3.00 on 12/27/2018 -Follows with Dr. Justin Mend -Needs nephrology consultation for fluid management, possible HD if unresponsive to diuretics   Other problems managed by primary team include: -DM2 -Anemia -Luekocytosis -Wheezing, acute     For questions or updates, please contact [facility] Please consult [email or web address] for contact info under Cardiology/STEMI.   Signed, Sharee Pimple  Glenford Peers NP-C [facility] Pager: [phone] 12/23/2018 3:57 PM   Attending Note:   The patient was seen and examined.  Agree with assessment and plan as noted above.  Changes made to the above note as needed.  Patient seen and independently examined with  Kathyrn Drown, NP .   We discussed all aspects of the encounter. I agree with the assessment and plan as stated above.  1.   Chest pain :   In the setting of acute shortness of breath and wheezing, acute renal insufficiency, reduced p.o. intake.  Her EKG shows no acute changes.  Troponin levels are 2671 then 2325.    Bedside echo ( poor image quality) showed possible concern for dyskinesis of the anterior wall with otherwise well preserved LV function.   ( which is contradictory - if there was truly dyskinesis of the ant. Wall, we would expect LV dilitation and other findings.  CXR shows clear lungs   Will get an echo tonight.   Will have the fellow review it   If she has a segmental wall motion abn, then I think we should treat her with IV heparin , NTG and not necessarily pursue cath tonight given her acute renal insufficiency.  We have discussed the possibility of cath and the risks involved including acute renal failure and the need for dialysis .  She does not want to go on dialysis.   2.   Acute renal insuff:   Appears to be volume depleted.  Have asked Dr. Ree Kida to gently hydrate.   3.  Chronic diastolic chf:        I have spent a total of 40 minutes with patient reviewing hospital  notes , telemetry, EKGs, labs and examining patient as well as establishing an assessment and plan that was discussed with the patient. > 50% of time was spent in direct patient care.    Thayer Headings, Brooke Bonito., MD, [facility] 01/03/2019, 5:36 PM [address]  [place] Pager [phone]

## 2019-01-05 NOTE — ED Notes (Signed)
Troponin 2671, Charge RN made aware, making a bed for patient

## 2019-01-05 NOTE — H&P (Signed)
[facility] History and Physical  Stacy Smith [id number] DOB: 1946/04/25 DOA: 12/28/2018  PCP: Jacqualine Code, DO  Patient coming from: Home  Chief Complaint: Chest pain, shortness of breath  HPI: Stacy Smith is a 73 y.o. female with a medical history of, CKD, diabetes, hypertension, who presented to the emergency department with complaints of shortness of breath and chest pain which is been ongoing for several days.  Patient states over the last few days, her breathing has worsened.  She recently was told to take increased amounts of her diuretics but cannot remember when.  She did this for approximately 3 days.  Patient also states that she was recently taking ibuprofen for pain.  Or historian at this time.  Her brother is at bedside, tells me that she recently saw Dr. Justin Mend a few weeks ago, and that her last creatinine was approximately 3.  Patient states that she does have worsening chest pain with deep inspiration.  She has not tried taking much for this.  Nothing seems to make it better.  Currently she denies any recent illness or travel, sick contacts, abdominal pain, nausea or vomiting, diarrhea or constipation, dizziness or headache, problems with urination.  ED Course: Found to have an elevated high-sensitivity troponin.  Cardiology was consulted, [facility] called for admission.  Review of Systems:  All other systems reviewed and are negative.   Past Medical History:  Diagnosis Date  . Anxiety   . Bradycardia   . CHF (congestive heart failure) ([place])   . Chronic kidney disease    CKD Stage 3  . Depression   . Diabetes mellitus without complication ([place])   . Hyperkalemia   . Hypertension   . Lupus ([place])   . Peripheral neuropathy   . Rheumatoid arthritis ([place])   . Seizures ([place])   . Shortness of breath dyspnea     Past Surgical History:  Procedure Laterality Date  . ABDOMINAL HYSTERECTOMY    . APPENDECTOMY    . CARDIAC CATHETERIZATION N/A 05/18/2016   Procedure: Right Heart Cath;  Surgeon: Burnell Blanks, MD;  Location: [facility];  Service: Cardiovascular;  Laterality: N/A;  . CYST EXCISION    . DENTAL SURGERY  01/18/15  . RIGHT HEART CATH N/A 06/12/2017   Procedure: RIGHT HEART CATH;  Surgeon: Larey Dresser, MD;  Location: [facility];  Service: Cardiovascular;  Laterality: N/A;    Social History:  reports that she has never smoked. She has never used smokeless tobacco. She reports that she does not drink alcohol or use drugs.  Allergies  Allergen Reactions  . Phenytoin Sodium Extended Itching  . Potassium-Containing Compounds Other (See Comments)    unspecified  . Latex Swelling    Family History  Problem Relation Age of Onset  . Heart failure Mother   . Liver cancer Father   . Lung cancer Father   . Cancer Sister   . Heart murmur Brother      Prior to Admission medications   Medication Sig Start Date End Date Taking? Authorizing Provider  amLODipine (NORVASC) 5 MG tablet Take 5 mg by mouth 2 (two) times daily.    Yes [provider]  aspirin EC 81 MG tablet Take 81 mg by mouth daily.   Yes [provider]  atorvastatin (LIPITOR) 80 MG tablet Take 1 tablet (80 mg total) by mouth daily at 6 PM. 06/19/17  Yes Sheikh, [place] Latif, DO  calcitRIOL (ROCALTROL) 0.25 MCG capsule Take  0.5 mcg by mouth daily.    Yes [provider]  carvedilol (COREG) 12.5 MG tablet TAKE ONE TABLET BY MOUTH TWICE A DAY WITH MEALS Patient taking differently: Take 12.5 mg by mouth 2 (two) times daily with a meal.  10/22/18  Yes Larey Dresser, MD  epoetin alfa-epbx (RETACRIT) 16109 UNIT/ML injection 10,000 Units every 14 (fourteen) days.   Yes Edrick Oh, MD  feeding supplement, ENSURE ENLIVE, (ENSURE ENLIVE) LIQD Take 237 mLs by mouth 3 (three) times daily between meals. 06/19/17  Yes Sheikh, Omair Latif, DO  ferrous sulfate 325 (65 FE) MG tablet Take 325 mg by mouth 2 (two) times daily with a meal.     Yes [provider]  gabapentin (NEURONTIN) 100 MG capsule Take 100 mg by mouth daily. 12/03/18  Yes [provider]  hydrALAZINE (APRESOLINE) 100 MG tablet Take 1 tablet (100 mg total) by mouth 3 (three) times daily. 08/11/17  Yes Lavina Hamman, MD  hydrOXYzine (ATARAX/VISTARIL) 10 MG tablet Take 10 mg by mouth 2 (two) times daily as needed for anxiety.   Yes [provider]  insulin aspart (NOVOLOG FLEXPEN) 100 UNIT/ML FlexPen Inject 4-8 Units into the skin 3 (three) times daily with meals. Per sliding scale   Yes [provider]  ipratropium-albuterol (DUONEB) 0.5-2.5 (3) MG/3ML SOLN Take 3 mLs by nebulization every 6 (six) hours as needed (for shortness of breath).    Yes [provider]  isosorbide mononitrate (IMDUR) 30 MG 24 hr tablet Take 1 tablet (30 mg total) by mouth daily. 06/20/17  Yes Sheikh, Omair Latif, DO  lactulose (CHRONULAC) 10 GM/15ML solution Take 45 mLs (30 g total) by mouth 2 (two) times daily. Patient taking differently: Take 10 g by mouth 2 (two) times daily.  06/19/17  Yes Sheikh, Omair Latif, DO  levothyroxine (SYNTHROID, LEVOTHROID) 25 MCG tablet Take 25 mcg by mouth daily before breakfast.   Yes [provider]  ondansetron (ZOFRAN-ODT) 8 MG disintegrating tablet Take 8 mg by mouth daily as needed for nausea or vomiting.  11/14/14  Yes [provider]  oxyCODONE (OXY IR/ROXICODONE) 5 MG immediate release tablet Take 5 mg by mouth every 6 (six) hours as needed for severe pain.   Yes [provider]  QUEtiapine (SEROQUEL) 25 MG tablet Take 25 mg by mouth 2 (two) times daily.   Yes [provider]  sertraline (ZOLOFT) 100 MG tablet Take 200 mg by mouth daily.    Yes [provider]  sodium chloride (OCEAN) 0.65 % SOLN nasal spray Place 1 spray into both nostrils as needed for congestion. 06/19/17  Yes Sheikh, Omair Latif, DO  torsemide (DEMADEX) 20 MG tablet TAKE 2 TABLETS BY MOUTH EVERY DAY  Patient taking differently: Take 40 mg by mouth daily.  10/05/17  Yes Larey Dresser, MD  traZODone (DESYREL) 100 MG tablet Take 1 tablet (100 mg total) by mouth at bedtime. 06/19/17  Yes Sheikh, Omair Latif, DO  Vitamin D, Ergocalciferol, (DRISDOL) 50000 units CAPS capsule Take 50,000 Units by mouth every 7 (seven) days.   Yes [provider]    Physical Exam: Vitals:   12/18/2018 1715 12/18/2018 1730  BP: (!) 114/59 (!) 113/56  Pulse: 70 70  Resp: 19 (!) 22  Temp:    SpO2: 97% 98%     General: Well developed, chronically ill-appearing, NAD  HEENT: NCAT, PERRLA, EOMI, Anicteic Sclera, mucous membranes moist.   Neck: Supple, no JVD, no masses  Cardiovascular: S1 S2  auscultated, no rubs, murmurs or gallops. Regular rate and rhythm.  Respiratory: Diminished breath sounds, end expiratory wheezing  Abdomen: Soft, nontender, nondistended, + bowel sounds  Extremities: warm dry without cyanosis clubbing or edema  Neuro: AAOx3, cranial nerves grossly intact. Strength equal and bilateral in lower and upper ext  Skin: Without rashes exudates or nodules  Psych: Normal affect and demeanor with intact judgement and insight  Labs on Admission: I have personally reviewed following labs and imaging studies CBC: Recent Labs  Lab 01/02/19 1243 12/23/2018 1401  WBC  --  12.9*  HGB 8.8* 8.9*  HCT 26.0* 28.2*  MCV  --  94.0  PLT  --  [id number]*   Basic Metabolic Panel: Recent Labs  Lab 01/02/19 1243 01/06/2019 1401  NA 137 131*  K 4.3 5.0  CL  --  99  CO2  --  17*  GLUCOSE 279* 363*  BUN  --  80*  CREATININE  --  5.15*  CALCIUM  --  8.8*   GFR: Estimated Creatinine Clearance: 8.5 mL/min (A) (by C-G formula based on SCr of 5.15 mg/dL (H)). Liver Function Tests: No results for input(s): AST, ALT, ALKPHOS, BILITOT, PROT, ALBUMIN in the last 168 hours. No results for input(s): LIPASE, AMYLASE in the last 168 hours. No results for input(s): AMMONIA in the last 168 hours.  Coagulation Profile: No results for input(s): INR, PROTIME in the last 168 hours. Cardiac Enzymes: No results for input(s): CKTOTAL, CKMB, CKMBINDEX, TROPONINI in the last 168 hours. BNP (last 3 results) No results for input(s): PROBNP in the last 8760 hours. HbA1C: No results for input(s): HGBA1C in the last 72 hours. CBG: No results for input(s): GLUCAP in the last 168 hours. Lipid Profile: No results for input(s): CHOL, HDL, LDLCALC, TRIG, CHOLHDL, LDLDIRECT in the last 72 hours. Thyroid Function Tests: No results for input(s): TSH, T4TOTAL, FREET4, T3FREE, THYROIDAB in the last 72 hours. Anemia Panel: No results for input(s): VITAMINB12, FOLATE, FERRITIN, TIBC, IRON, RETICCTPCT in the last 72 hours. Urine analysis:    Component Value Date/Time   COLORURINE YELLOW 08/06/2017 1512   APPEARANCEUR CLEAR 08/06/2017 1512   LABSPEC 1.008 08/06/2017 1512   PHURINE 5.0 08/06/2017 1512   GLUCOSEU NEGATIVE 08/06/2017 1512   HGBUR NEGATIVE 08/06/2017 1512   BILIRUBINUR NEGATIVE 08/06/2017 1512   KETONESUR NEGATIVE 08/06/2017 1512   PROTEINUR 100 (A) 08/06/2017 1512   NITRITE NEGATIVE 08/06/2017 1512   LEUKOCYTESUR NEGATIVE 08/06/2017 1512   Sepsis Labs: @LABRCNTIP (procalcitonin:4,lacticidven:4) )No results found for this or any previous visit (from the past 240 hour(s)).   Radiological Exams on Admission: Dg Chest 2 View  Result Date: 01/03/2019 CLINICAL DATA:  Chest pain, shortness of breath, left-sided chest pain, recent falls, left shoulder pain EXAM: CHEST - 2 VIEW COMPARISON:  08/06/2017 FINDINGS: Cardiomegaly. Small bilateral pleural effusions with associated atelectasis or consolidation. The visualized skeletal structures are unremarkable. IMPRESSION: 1. Cardiomegaly. Small bilateral pleural effusions with associated atelectasis or consolidation. 2. No obvious abnormality of the partially included left shoulder. Recommend dedicated shoulder radiographs if there is suspicion for  fracture. Electronically Signed   By: Eddie Candle M.D.   On: 01/01/2019 14:40    EKG: Independently reviewed.  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 Justin Mend -Last creatinine approximately 3 -On admission, creatinine 5.15 -Patient does admit to using ibuprofen along with increased diuretics over the last several days -Placed on gentle IV fluids -Monitor BMP -Nephrology consulted and appreciated  Dyspnea -Patient with end expiratory wheezing -Do not feel this is secondary to heart failure exacerbation.  Patient appears to be dry on examination -Chest x-ray does not show volume overload, infection or infiltrate. -Will place her on albuterol metered-dose inhaler  Chronic diastolic heart failure -Last echocardiogram 08/07/2017 showed normal EF, grade 2 diastolic dysfunction.  PA peak pressure of 50 mmHg -Given acute kidney injury, will hold diuretics at this time -Monitor intake and output, daily weights  Mild hyponatremia -Will place on IV fluids and monitor BMP  Leukocytosis -WBC is 12.9, suspect reactive to the above  Chronic normocytic anemia -Hemoglobin currently 8.9, continue to monitor CBC -Continue supplemental iron  Essential hypertension -Continue Coreg, amlodipine, hydralazine  Hyperlipidemia -Continue statin -Obtain lipid panel  Hypothyroidism -Continue Synthroid -Obtain TSH and free T4 levels   Diabetes mellitus, type II -Placed on insulin sliding scale with CBG monitoring -Hemoglobin A1c ordered  DVT prophylaxis: Heparin   Code Status: Full  Family Communication: Brother at bedside.  Admission, patients condition and plan of care including tests being ordered have been discussed with the patient and brother who indicate understanding and agree with the plan and Code Status.  Disposition Plan: Home  Consults called: Cardiology, nephrology  Admission status: Admitted.  Given patient's complexity and fragility with acute kidney injury in the setting of elevated troponin and possible need for coronary intervention, will admit to inpatient.  Time spent: 70 minutes  Rylynne Schicker D.O. [facility]  Between 7am to 7pm - Please see pager noted on [email or web address]  After 7pm go to [email or web address] 12/16/2018, 5:41 PM

## 2019-01-05 NOTE — Consult Note (Addendum)
Renal Service Consult Note [name]  Stacy Smith 01/10/2019 Sol Blazing Requesting Physician:  Stacy Stacy Smith  Reason for Consult:  CKD IV patient w/ chest pain HPI: The patient is a 73 y.o. year-old with hx of DM2, HTN, RA, FM, SLE and CKD IV presented w/ SOB and CP x days. Had recently increased her diuretics and was also taking some nsaids for CP. She is f/b Stacy Smith and CKA and last creat was approximately 3, per family.  ED w/u showed high trop , cardiology was consulted. They noted bedside echo results and clear CXR. Formal echo was ordered and pt started on IV heparin.  Question of risk of heart cath w/ renal failure was raised and renal consult requested in this regard.   Creat in ED is up at 5.15, last creat here on 8/13 was 3.00.  Temp in ED was 99.5.  Test for COVID is pending,sent at 2 pm today.   Last UA 07/2017 showed 100 prot and no rbc/ wbc.  Last renal US in 06/2017 showed 11.6cm R  and 8.4 cm L kidneys w/o hydro  last abd [place] in 06/2018 showed 12.7cm R and 11.9 cm L kidneys w/o hydro  Patient states has had "jerking" of arms and legs for the last 1- 2 weeks. Cannot walk because her legs are jerking as well. Takes gabapentin and oxycontin , has arthritis and FM.  Has had nausea also 1-2 wks possibly longer and fatigue and excessive drowsiness.    Date  Creat  eGFR  2016  1.25- 1.44   2018  1.80- 1.97 28  2019 jan-mar  2.11- 3.72 17- 27  2019 apr-dec  2.47- 3.97 12- 06 Jul 2018 2.49  21, stage IV CKD    Dec 27 2018 3.00    Jan 05, 2019  5.15  8    ROS  no joint pain   no HA  no blurry vision  no rash  no diarrhea  no dysuria  no difficulty voiding     Past Medical History  Past Medical History:  Diagnosis Date  . Anxiety   . Bradycardia   . CHF (congestive heart failure) ([place])   . Chronic kidney disease    CKD Stage 3  . Depression   . Diabetes mellitus without complication ([place])   . Hyperkalemia   . Hypertension   . Lupus ([place])    . Peripheral neuropathy   . Rheumatoid arthritis ([place])   . Seizures ([place])   . Shortness of breath dyspnea    Past Surgical History  Past Surgical History:  Procedure Laterality Date  . ABDOMINAL HYSTERECTOMY    . APPENDECTOMY    . CARDIAC CATHETERIZATION N/A 05/18/2016   Procedure: Right Heart Cath;  Surgeon: Burnell Blanks, MD;  Location: [facility];  Service: Cardiovascular;  Laterality: N/A;  . CYST EXCISION    . DENTAL SURGERY  01/18/15  . RIGHT HEART CATH N/A 06/12/2017   Procedure: RIGHT HEART CATH;  Surgeon: Larey Dresser, MD;  Location: [place];  Service: Cardiovascular;  Laterality: N/A;   Family History  Family History  Problem Relation Age of Onset  . Heart failure Mother   . Liver cancer Father   . Lung cancer Father   . Cancer Sister   . Heart murmur Brother    Social History  reports that she has never smoked. She has never used smokeless tobacco. She reports that she does not  drink alcohol or use drugs. Allergies  Allergies  Allergen Reactions  . Phenytoin Sodium Extended Itching  . Potassium-Containing Compounds Other (See Comments)    unspecified  . Latex Swelling   Home medications Prior to Admission medications   Medication Sig Start Date End Date Taking? Authorizing Provider  amLODipine (NORVASC) 5 MG tablet Take 5 mg by mouth 2 (two) times daily.    Yes [provider]  aspirin EC 81 MG tablet Take 81 mg by mouth daily.   Yes [provider]  atorvastatin (LIPITOR) 80 MG tablet Take 1 tablet (80 mg total) by mouth daily at 6 PM. 06/19/17  Yes Sheikh, Omair Latif, DO  calcitRIOL (ROCALTROL) 0.25 MCG capsule Take 0.5 mcg by mouth daily.    Yes [provider]  carvedilol (COREG) 12.5 MG tablet TAKE ONE TABLET BY MOUTH TWICE A DAY WITH MEALS Patient taking differently: Take 12.5 mg by mouth 2 (two) times daily with a meal.  10/22/18  Yes Larey Dresser, MD  epoetin alfa-epbx (RETACRIT) 65537 UNIT/ML  injection 10,000 Units every 14 (fourteen) days.   Yes Edrick Oh, MD  feeding supplement, ENSURE ENLIVE, (ENSURE ENLIVE) LIQD Take 237 mLs by mouth 3 (three) times daily between meals. 06/19/17  Yes Sheikh, Omair Latif, DO  ferrous sulfate 325 (65 FE) MG tablet Take 325 mg by mouth 2 (two) times daily with a meal.    Yes [provider]  gabapentin (NEURONTIN) 100 MG capsule Take 100 mg by mouth daily. 12/03/18  Yes [provider]  hydrALAZINE (APRESOLINE) 100 MG tablet Take 1 tablet (100 mg total) by mouth 3 (three) times daily. 08/11/17  Yes Lavina Hamman, MD  hydrOXYzine (ATARAX/VISTARIL) 10 MG tablet Take 10 mg by mouth 2 (two) times daily as needed for anxiety.   Yes [provider]  insulin aspart (NOVOLOG FLEXPEN) 100 UNIT/ML FlexPen Inject 4-8 Units into the skin 3 (three) times daily with meals. Per sliding scale   Yes [provider]  ipratropium-albuterol (DUONEB) 0.5-2.5 (3) MG/3ML SOLN Take 3 mLs by nebulization every 6 (six) hours as needed (for shortness of breath).    Yes [provider]  isosorbide mononitrate (IMDUR) 30 MG 24 hr tablet Take 1 tablet (30 mg total) by mouth daily. 06/20/17  Yes Sheikh, Omair Latif, DO  lactulose (CHRONULAC) 10 GM/15ML solution Take 45 mLs (30 g total) by mouth 2 (two) times daily. Patient taking differently: Take 10 g by mouth 2 (two) times daily.  06/19/17  Yes Sheikh, Omair Latif, DO  levothyroxine (SYNTHROID, LEVOTHROID) 25 MCG tablet Take 25 mcg by mouth daily before breakfast.   Yes [provider]  ondansetron (ZOFRAN-ODT) 8 MG disintegrating tablet Take 8 mg by mouth daily as needed for nausea or vomiting.  11/14/14  Yes [provider]  oxyCODONE (OXY IR/ROXICODONE) 5 MG immediate release tablet Take 5 mg by mouth every 6 (six) hours as needed for severe pain.   Yes [provider]  QUEtiapine (SEROQUEL) 25 MG tablet Take 25 mg by mouth 2 (two) times daily.   Yes [provider]  sertraline (ZOLOFT) 100 MG tablet Take 200 mg by mouth daily.    Yes [provider]  sodium chloride (OCEAN) 0.65 % SOLN nasal spray Place 1 spray into both nostrils as needed for congestion. 06/19/17  Yes Sheikh, Omair Latif, DO  torsemide (DEMADEX) 20 MG tablet TAKE 2 TABLETS BY MOUTH EVERY DAY Patient taking differently: Take 40 mg by mouth  daily.  10/05/17  Yes Larey Dresser, MD  traZODone (DESYREL) 100 MG tablet Take 1 tablet (100 mg total) by mouth at bedtime. 06/19/17  Yes Sheikh, Omair Latif, DO  Vitamin D, Ergocalciferol, (DRISDOL) 50000 units CAPS capsule Take 50,000 Units by mouth every 7 (seven) days.   Yes [provider]   Liver Function Tests No results for input(s): AST, ALT, ALKPHOS, BILITOT, PROT, ALBUMIN in the last 168 hours. No results for input(s): LIPASE, AMYLASE in the last 168 hours. CBC Recent Labs  Lab 01/02/19 1243 01/13/2019 1401  WBC  --  12.9*  HGB 8.8* 8.9*  HCT 26.0* 28.2*  MCV  --  94.0  PLT  --  417*   Basic Metabolic Panel Recent Labs  Lab 01/02/19 1243 12/18/2018 1401  NA 137 131*  K 4.3 5.0  CL  --  99  CO2  --  17*  GLUCOSE 279* 363*  BUN  --  80*  CREATININE  --  5.15*  CALCIUM  --  8.8*   Iron/TIBC/Ferritin/ %Sat    Component Value Date/Time   IRON 82 12/19/2018 1220   TIBC 196 (L) 12/19/2018 1220   FERRITIN 343 (H) 12/19/2018 1220   IRONPCTSAT 42 (H) 12/19/2018 1220    Vitals:   01/04/2019 1715 01/01/2019 1730 12/31/2018 1745 12/26/2018 1847  BP: (!) 114/59 (!) 113/56 (!) 116/49 (!) 126/59  Pulse: 70 70 67 64  Resp: 19 (!) _0 Temp:    98.1 F (36.7 C)  TempSrc:    Oral  SpO2: 97% 98% 98% 97%    Exam Gen eldelry frail WF not in distress No rash, cyanosis or gangrene Sclera anicteric, throat clear and dry  No jvd or bruits Chest clear bilat to bases RRR no MRG Abd soft ntnd no mass or ascites +bs GU defer MS no joint effusions or deformity Ext no LE or UE edema, no wounds or  ulcers Neuro is alert, Ox 3 , nf, +asterixis moderate severity      Home meds:  - amlodipine 5 bid/ carvedilol 12.5 bid/ hydralazine 100 tid/ torsemide 40 qd  - insulin aspart 4-8 u tid ac  - trazodone 100 hs/ sertraline 200 qd/ oxycodone IR 5 qid prn/ gabapentin 100 qd/ quetiapine 25 bid  - aspirin 81 qd/ atorvastatin 80 qd/ isosorbide mononitrate 30 qd  - levothyroxine 25 ug qd/ lactulose 10 gm bid  - prn ipratropium-albuterol 0.5- 2.5 qid prn  - prn's/ vitamins/ supplements  Date  Creat  eGFR  2016  1.25- 1.44   2018  1.80- 1.97 28  2019 jan-mar  2.11- 3.72 17- 27  2019 apr-dec  2.47- 3.97 12- 06 Jul 2018 2.49  21, stage IV CKD    Dec 27 2018 3.00    Jan 05, 2019  5.15  8     Assessment/ Plan: 1. AoCKD4 - acute renal failure vs progression to CKDV (esrd). Pt is likely uremic w/ asterixis, nausea, fatigue and ^'d drowsiness. Jerking may be medication-related however. Pt agrees to trial off of gabapentin and narcotics as these for 1-2 days and use tylenol for pain.  We talked at length about possibility of needing dialysis. She doesn't feel like she would tolerate dialysis well and as of tonight as least is stating that she will not do dialysis, and understands the consequences. For now looks a bit dry, will start IVF's. Will follow.  2. HTN - on 3 bp meds and torsemide at home. BP's  are on the low side , will decrease meds somewhat.  3. Chest pain / +trop 4. DM2 5. Chronic pain - on gabapentin/ oxy IR and other psych meds 6. H/o RA/ SLE / FM      Kelly Splinter  MD 01/04/2019, 6:59 PM

## 2019-01-05 NOTE — ED Provider Notes (Signed)
I saw and evaluated the patient, reviewed the resident's note and I agree with the findings and plan.  Pertinent History: This patient is a ill-appearing 73 year old female with a known history of progressive chronic kidney disease, she has presented today with increasing chest pain and difficulty breathing over the last couple of days.  She is clutching her chest.  She has diffuse expiratory wheezing with a prolonged expiratory phase and subtle rales at the bases.  She has scant pitting edema to the bilateral legs which is symmetrical, nontender abdomen and speaks in shortened sentences.  She has been having several days of worsening chest pain, denies prior history of known obstructive disease  Pertinent Exam findings: The pt is tachypneic, has diffuse wheezing, scant edema, and appears uncomfortable - no murmurs - normal pulses, no obvoius JVD  I was personally present and directly supervised the following procedures:  Medical resucitation  This patient is critically ill with what appears to be a new non-ST elevation MI.  The EKG does not reveal ST elevations, the patient's exam is concerning and that she is clutching her chest, she is short of breath, she is tachypneic and I suspect she has some element of congestive heart failure which is likely contributing from her worsening renal function.  Creatinine is almost twice as bad as it used to be with a creatinine of 5 and a BUN of 82, she is agreeable to be admitted to the hospital, she follows with Dr. Algernon Huxley of the cardiology service and her primary doctor is in [place].  Her nephrologist is Dr. Edrick Oh.  She is urinating multiple times per day.  Critical care provided.  Heparin drip  .Critical Care Performed by: Noemi Chapel, MD Authorized by: Noemi Chapel, MD   Critical care provider statement:    Critical care time (minutes):  35   Critical care time was exclusive of:  Separately billable procedures and treating other  patients and teaching time   Critical care was necessary to treat or prevent imminent or life-threatening deterioration of the following conditions:  Cardiac failure   Critical care was time spent personally by me on the following activities:  Blood draw for specimens, development of treatment plan with patient or surrogate, discussions with consultants, evaluation of patient's response to treatment, examination of patient, obtaining history from patient or surrogate, ordering and performing treatments and interventions, ordering and review of laboratory studies, ordering and review of radiographic studies, pulse oximetry, re-evaluation of patient's condition and review of old charts    I personally interpreted the EKG as well as the resident and agree with the interpretation on the resident's chart.  Final diagnoses:  NSTEMI (non-ST elevated myocardial infarction) ([place])  Acute renal failure superimposed on stage 4 chronic kidney disease, unspecified acute renal failure type (HCC)      Noemi Chapel, MD 01/06/19 [phone]

## 2019-01-05 NOTE — ED Triage Notes (Signed)
Pt reports sob, fatigue and leg weakness for the past few days. Pt usually walks with a walker but she has been having more difficulty lately. Oxygen saturation 88% on room air, pt placed on 2L [place]. Pt pale, ill appearing in triage.

## 2019-01-05 NOTE — ED Notes (Signed)
ED Provider at bedside. 

## 2019-01-05 NOTE — Progress Notes (Signed)
*  PRELIMINARY RESULTS* Echocardiogram 2D Echocardiogram has been performed.  Stacy Smith 12/23/2018, 6:45 PM

## 2019-01-06 DIAGNOSIS — I5043 Acute on chronic combined systolic (congestive) and diastolic (congestive) heart failure: Secondary | ICD-10-CM

## 2019-01-06 LAB — LIPID PANEL
Cholesterol: 104 mg/dL (ref 0–200)
HDL: 28 mg/dL — ABNORMAL LOW (ref >40)
LDL Cholesterol: 56 mg/dL (ref 0–99)
Total CHOL/HDL Ratio: 3.7 RATIO
Triglycerides: 101 mg/dL (ref <150)
VLDL: 20 mg/dL (ref 0–40)

## 2019-01-06 LAB — CBC
HCT: 24.4 % — ABNORMAL LOW (ref 36.0–46.0)
HCT: 25.6 % — ABNORMAL LOW (ref 36.0–46.0)
Hemoglobin: 7.9 g/dL — ABNORMAL LOW (ref 12.0–15.0)
Hemoglobin: 8.1 g/dL — ABNORMAL LOW (ref 12.0–15.0)
MCH: 29.1 pg (ref 26.0–34.0)
MCH: 30 pg (ref 26.0–34.0)
MCHC: 31.6 g/dL (ref 30.0–36.0)
MCHC: 32.4 g/dL (ref 30.0–36.0)
MCV: 92.1 fL (ref 80.0–100.0)
MCV: 92.8 fL (ref 80.0–100.0)
Platelets: 121 10*3/uL — ABNORMAL LOW (ref 150–400)
Platelets: 124 10*3/uL — ABNORMAL LOW (ref 150–400)
RBC: 2.63 MIL/uL — ABNORMAL LOW (ref 3.87–5.11)
RBC: 2.78 MIL/uL — ABNORMAL LOW (ref 3.87–5.11)
RDW: 14.5 % (ref 11.5–15.5)
RDW: 14.6 % (ref 11.5–15.5)
WBC: 9.3 10*3/uL (ref 4.0–10.5)
WBC: 9.5 10*3/uL (ref 4.0–10.5)
nRBC: 0 % (ref 0.0–0.2)
nRBC: 0 % (ref 0.0–0.2)

## 2019-01-06 LAB — BASIC METABOLIC PANEL
Anion gap: 13 (ref 5–15)
BUN: 88 mg/dL — ABNORMAL HIGH (ref 8–23)
CO2: 19 mmol/L — ABNORMAL LOW (ref 22–32)
Calcium: 8.4 mg/dL — ABNORMAL LOW (ref 8.9–10.3)
Chloride: 98 mmol/L (ref 98–111)
Creatinine, Ser: 5.47 mg/dL — ABNORMAL HIGH (ref 0.44–1.00)
GFR calc Af Amer: 8 mL/min — ABNORMAL LOW (ref >60)
GFR calc non Af Amer: 7 mL/min — ABNORMAL LOW (ref >60)
Glucose, Bld: 270 mg/dL — ABNORMAL HIGH (ref 70–99)
Potassium: 4.9 mmol/L (ref 3.5–5.1)
Sodium: 130 mmol/L — ABNORMAL LOW (ref 135–145)

## 2019-01-06 LAB — HEPARIN LEVEL (UNFRACTIONATED)
Heparin Unfractionated: 0.15 IU/mL — ABNORMAL LOW (ref 0.30–0.70)
Heparin Unfractionated: 0.16 IU/mL — ABNORMAL LOW (ref 0.30–0.70)

## 2019-01-06 LAB — GLUCOSE, CAPILLARY
Glucose-Capillary: 222 mg/dL — ABNORMAL HIGH (ref 70–99)
Glucose-Capillary: 254 mg/dL — ABNORMAL HIGH (ref 70–99)
Glucose-Capillary: 245 mg/dL — ABNORMAL HIGH (ref 70–99)
Glucose-Capillary: 232 mg/dL — ABNORMAL HIGH (ref 70–99)

## 2019-01-06 LAB — NOVEL CORONAVIRUS, NAA (HOSP ORDER, SEND-OUT TO REF LAB; TAT 18-24 HRS): SARS-CoV-2, NAA: NOT DETECTED

## 2019-01-06 MED ORDER — HYDROMORPHONE HCL 1 MG/ML IJ SOLN
0.5000 mg | INTRAMUSCULAR | Status: DC | PRN
  Administered 2019-01-06: 1 mg via INTRAVENOUS

## 2019-01-06 MED ORDER — HYDROMORPHONE HCL 1 MG/ML IJ SOLN
0.5000 mg | INTRAMUSCULAR | Status: DC | PRN
  Administered 2019-01-06: 09:00:00 0.5 mg via INTRAVENOUS
  Filled 2019-01-06 (×2): qty 1

## 2019-01-06 MED ORDER — HYDROMORPHONE HCL 1 MG/ML IJ SOLN
0.5000 mg | INTRAMUSCULAR | Status: DC | PRN
  Administered 2019-01-06 – 2019-01-07 (×3): 1 mg via INTRAVENOUS
  Filled 2019-01-06 (×3): qty 1

## 2019-01-06 MED ORDER — METOCLOPRAMIDE HCL 5 MG/ML IJ SOLN
10.0000 mg | Freq: Once | INTRAMUSCULAR | Status: AC
  Administered 2019-01-06: 23:00:00 10 mg via INTRAVENOUS
  Filled 2019-01-06: qty 2

## 2019-01-06 MED ORDER — MORPHINE SULFATE (PF) 4 MG/ML IV SOLN
INTRAVENOUS | Status: AC
  Administered 2019-01-06: 23:00:00 4 mg
  Filled 2019-01-06: qty 1

## 2019-01-06 MED ORDER — MORPHINE SULFATE (PF) 4 MG/ML IV SOLN: 4.0000 mg | INTRAVENOUS | Status: DC | PRN

## 2019-01-06 MED ORDER — MORPHINE SULFATE (PF) 2 MG/ML IV SOLN: 1.0000 mg | INTRAVENOUS | Status: DC | PRN

## 2019-01-06 NOTE — Progress Notes (Signed)
Progress Note  Patient Name: Stacy Smith Date of Encounter: 01/06/2019  Primary Cardiologist: Carlyle Dolly, MD  / Aundra Dubin   Subjective    73 year old with history of chronic diastolic congestive heart failure, chronic kidney disease was admitted last night with chest pain, elevated troponin levels and acute on chronic renal insufficiency.  Bedside echo revealed anterior wall akinesis with anteroapical dyskinesis.  Regular echo later in the evening confirmed that finding.  Her creatinine has continued to increase.  She is not a good candidate for dialysis and so has been made comfort care.  Reviewing her history, I think that she had a myocardial infarction 1011 days ago when she presented to her gastroenterologist with increased swelling.  She is had ongoing pain since that time.  Her troponin levels are trending downward which is consistent with an MI a week or so ago.  Inpatient Medications    Scheduled Meds: . amLODipine  2.5 mg Oral BID  . aspirin  324 mg Oral NOW   Or  . aspirin  300 mg Rectal NOW  . aspirin EC  81 mg Oral Daily  . atorvastatin  80 mg Oral q1800  . calcitRIOL  0.5 mcg Oral Daily  . carvedilol  6.25 mg Oral BID WC  . feeding supplement (ENSURE ENLIVE)  237 mL Oral TID BM  . ferrous sulfate  325 mg Oral BID WC  . hydrALAZINE  25 mg Oral TID  . insulin aspart  0-9 Units Subcutaneous TID WC  . isosorbide mononitrate  30 mg Oral Daily  . levothyroxine  25 mcg Oral QAC breakfast  . QUEtiapine  25 mg Oral BID  . sertraline  200 mg Oral Daily  . traZODone  100 mg Oral QHS  . [START ON 01/11/2019] Vitamin D (Ergocalciferol)  50,000 Units Oral Q Fri   Continuous Infusions: . sodium chloride 75 mL/hr at 01/06/19 0019  . heparin 1,050 Units/hr (01/06/19 0110)   PRN Meds: acetaminophen, albuterol, HYDROmorphone (DILAUDID) injection, hydrOXYzine, nitroGLYCERIN, ondansetron (ZOFRAN) IV, ondansetron, sodium chloride   Vital Signs    Vitals:   12/21/2018  2123 01/06/19 0419 01/06/19 0751 01/06/19 0855  BP: (!) 108/57  122/76 (!) 111/53  Pulse: (!) 56  63 60  Resp: 18     Temp: 98.1 F (36.7 C) 98.4 F (36.9 C)    TempSrc: Oral Axillary    SpO2: 97% 92%    Weight:  64.4 kg    Height:        Intake/Output Summary (Last 24 hours) at 01/06/2019 1018 Last data filed at 01/06/2019 0900 Gross per 24 hour  Intake 1061.33 ml  Output -  Net 1061.33 ml   Last 3 Weights 01/06/2019 12/29/2018 01/02/2019  Weight (lbs) 142 lb 131 lb 6.3 oz 139 lb 2 oz  Weight (kg) 64.411 kg 59.6 kg 63.107 kg      Telemetry    Glade Stanford at 61  - Personally Reviewed  ECG       Physical Exam   GEN:  elderly female,  NAD  Neck: No JVD Cardiac: RRR,  Soft murmur  Respiratory: breath sound are better today  GI: Soft, nontender, non-distended  MS: No edema; No deformity. Neuro:  Nonfocal  Psych: Normal affect   Labs    High Sensitivity Troponin:   Recent Labs  Lab 01/09/2019 1401 12/16/2018 1545 12/21/2018 1855 01/12/2019 2026  TROPONINIHS 2,671* 2,325* 2,099* 1,900*      Cardiac EnzymesNo results for input(s): TROPONINI in the  last 168 hours. No results for input(s): TROPIPOC in the last 168 hours.   Chemistry Recent Labs  Lab 01/02/19 1243 01/06/2019 1401 01/06/19 0005  NA 137 131* 130*  K 4.3 5.0 4.9  CL  --  99 98  CO2  --  17* 19*  GLUCOSE 279* 363* 270*  BUN  --  80* 88*  CREATININE  --  5.15* 5.47*  CALCIUM  --  8.8* 8.4*  GFRNONAA  --  8* 7*  GFRAA  --  9* 8*  ANIONGAP  --  15 13     Hematology Recent Labs  Lab 12/28/2018 1401 01/06/19 0005 01/06/19 0901  WBC 12.9* 9.3 9.5  RBC 3.00* 2.63* 2.78*  HGB 8.9* 7.9* 8.1*  HCT 28.2* 24.4* 25.6*  MCV 94.0 92.8 92.1  MCH 29.7 30.0 29.1  MCHC 31.6 32.4 31.6  RDW 14.6 14.6 14.5  PLT 131* 124* 121*    BNPNo results for input(s): BNP, PROBNP in the last 168 hours.   DDimer No results for input(s): DDIMER in the last 168 hours.   Radiology    Dg Chest 2 View  Result Date:  12/22/2018 CLINICAL DATA:  Chest pain, shortness of breath, left-sided chest pain, recent falls, left shoulder pain EXAM: CHEST - 2 VIEW COMPARISON:  08/06/2017 FINDINGS: Cardiomegaly. Small bilateral pleural effusions with associated atelectasis or consolidation. The visualized skeletal structures are unremarkable. IMPRESSION: 1. Cardiomegaly. Small bilateral pleural effusions with associated atelectasis or consolidation. 2. No obvious abnormality of the partially included left shoulder. Recommend dedicated shoulder radiographs if there is suspicion for fracture. Electronically Signed   By: Eddie Candle M.D.   On: 12/27/2018 14:40    Cardiac Studies      Patient Profile     73 y.o. female admitted with a late presenting ant. MI.  And acute on chronic renal insuff.   Assessment & Plan    1.  Acute on chronic combined systolic and diastolic congestive heart failure: The patient has a long history of diastolic congestive heart failure.  She now presents with acute systolic heart failure with an ejection fraction around 30%.  She has anterior wall akinesis with anteroapical dyskinesis.  Her troponin levels are trending down which suggest that her MI was days ago.  By history I suspect it was around August 10 when she presented to the gastroenterologist with increasing swelling and increasing shortness of breath.  She has acute on chronic renal insufficiency with a creatinine greater than 5.  She is been off her dialysis and was told that she is not a good candidate because of small vein size.  She also does think that she would be able to comply with any sort of dialysis schedule.  She has been seen by nephrology.  She has been made a DNR.  2.  CAD:  Likely has an occluded LAD .  We are not able to do a cath because of her renal insufficiency high risk of ESRD.    3.  Acute on chronic renal insufficiency:  Followed by nephrology      For questions or updates, please contact [place]  Please consult [email or web address] for contact info under        Signed, Mertie Moores, MD  01/06/2019, 10:18 AM

## 2019-01-06 NOTE — Progress Notes (Signed)
PROGRESS NOTE    Stacy Smith  [id number] DOB: [birth year] DOA: 12/17/2018 PCP: Jacqualine Code, DO   Brief Narrative:  HPI on 12/25/2018  Stacy Smith is a 73 y.o. female with a medical history of, CKD, diabetes, hypertension, who presented to the emergency department with complaints of shortness of breath and chest pain which is been ongoing for several days.  Patient states over the last few days, her breathing has worsened.  She recently was told to take increased amounts of her diuretics but cannot remember when.  She did this for approximately 3 days.  Patient also states that she was recently taking ibuprofen for pain.  Or historian at this time.  Her brother is at bedside, tells me that she recently saw Dr. Justin Mend a few weeks ago, and that her last creatinine was approximately 3.  Patient states that she does have worsening chest pain with deep inspiration.  She has not tried taking much for this.  Nothing seems to make it better.  Currently she denies any recent illness or travel, sick contacts, abdominal pain, nausea or vomiting, diarrhea or constipation, dizziness or headache, problems with urination.  Assessment & Plan   NSTEMI/Chest pain -Presented with chest pain and shortness of breath that has been ongoing for several days -EKG did not show acute changes -Initial troponin (see high-sensitivity) 2671, and trending down -Echocardiogram: EF of 30 to 35%.  Moderate akinesis LV, entire anteroseptal wall.  Moderate dyskinesis of left ventricle, mid apical anterior septal wall and apical segment. 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 Justin Mend -Last creatinine approximately 3 -Patient does admit to using ibuprofen along with increased diuretics over the last several days -On admission, creatinine  5.15- despite IVF, creatinine up to 5.47 -Continue IV fluids -Monitor BMP -Nephrology consulted and appreciated. Feels patient has uremia with asterixis -patient states she does not want hemodialysis and tells me that she was told she had small veins and arteries   Dyspnea -Wheezing has resolved -Do not feel this is secondary to heart failure exacerbation.  Patient appears to be dry on examination -Chest x-ray does not show volume overload, infection or infiltrate. -Continue albuterol metered-dose inhaler  Chronic diastolic heart failure -Last echocardiogram 08/07/2017 showed normal EF, grade 2 diastolic dysfunction.  PA peak pressure of 50 mmHg -Given acute kidney injury, diuretics held at this time -Monitor intake and output, daily weights  Mild hyponatremia -sodium 130, continue IVF and monitor BMP  Leukocytosis -WBC was 12.9, suspect reactive to the above -Resolved   Chronic normocytic anemia -Hemoglobin currently 8.1, continue to monitor CBC -Continue supplemental iron  Essential hypertension -Continue Coreg, amlodipine, hydralazine  Hyperlipidemia -Continue statin -Obtain lipid panel  Hypothyroidism -Continue Synthroid -TSH 3.611, Ft4 0.6    Diabetes mellitus, type II -Placed on insulin sliding scale with CBG monitoring -Hemoglobin A1c 7.9  Liver cirrhosis -lactulose and diureticsheld -Continue to monitor   Goals of care -Discussed code status with patient, states she does not want compressions or intubation- have changed code status to DNR -palliative care consulted for [place] discussion  DVT Prophylaxis  heparin  Code Status: DNR  Family Communication: None at bedside. Brother via phone  Disposition Plan: Admitted.   Consultants Cardiology Nephrology Palliative care  Procedures  Echocardiogram  Antibiotics   Anti-infectives (From admission, onward)   None  Subjective:   Stacy Smith seen and examined today.  Patient states  that she is too tired to speak today.  She continues to have chest pain.  Feels her breathing has improved.  Denies current abdominal pain, nausea or vomiting, diarrhea or constipation.  Objective:   Vitals:   01/06/19 0419 01/06/19 0751 01/06/19 0855 01/06/19 1132  BP:  122/76 (!) 111/53   Pulse:  63 60   Resp:      Temp: 98.4 F (36.9 C)   98.9 F (37.2 C)  TempSrc: Axillary   Oral  SpO2: 92%     Weight: 64.4 kg     Height:        Intake/Output Summary (Last 24 hours) at 01/06/2019 1315 Last data filed at 01/06/2019 0900 Gross per 24 hour  Intake 1061.33 ml  Output -  Net 1061.33 ml   Filed Weights   01/12/2019 1847 01/06/19 0419  Weight: 59.6 kg 64.4 kg    Exam  General: Well developed, ill-appearing, NAD  HEENT: NCAT,  mucous membranes moist.   Cardiovascular: S1 S2 auscultated, RRR, soft SEM  Respiratory: Clear to auscultation, no wheezing  Abdomen: Soft, nontender, nondistended, + bowel sounds  Extremities: warm dry without cyanosis clubbing or edema  Neuro: AAOx3, nonfocal  Psych: Appropriate   Data Reviewed: I have personally reviewed following labs and imaging studies  CBC: Recent Labs  Lab 01/02/19 1243 12/31/2018 1401 01/06/19 0005 01/06/19 0901  WBC  --  12.9* 9.3 9.5  HGB 8.8* 8.9* 7.9* 8.1*  HCT 26.0* 28.2* 24.4* 25.6*  MCV  --  94.0 92.8 92.1  PLT  --  131* 124* 123XX123*   Basic Metabolic Panel: Recent Labs  Lab 01/02/19 1243 01/08/2019 1401 12/24/2018 1855 01/06/19 0005  NA 137 131*  --  130*  K 4.3 5.0  --  4.9  CL  --  99  --  98  CO2  --  17*  --  19*  GLUCOSE 279* 363*  --  270*  BUN  --  80*  --  88*  CREATININE  --  5.15*  --  5.47*  CALCIUM  --  8.8*  --  8.4*  MG  --   --  2.7*  --    GFR: Estimated Creatinine Clearance: 8.2 mL/min (A) (by C-G formula based on SCr of 5.47 mg/dL (H)). Liver Function Tests: No results for input(s): AST, ALT, ALKPHOS, BILITOT, PROT, ALBUMIN in the last 168 hours. No results for input(s):  LIPASE, AMYLASE in the last 168 hours. No results for input(s): AMMONIA in the last 168 hours. Coagulation Profile: No results for input(s): INR, PROTIME in the last 168 hours. Cardiac Enzymes: No results for input(s): CKTOTAL, CKMB, CKMBINDEX, TROPONINI in the last 168 hours. BNP (last 3 results) No results for input(s): PROBNP in the last 8760 hours. HbA1C: Recent Labs    12/28/2018 1912  HGBA1C 7.9*   CBG: Recent Labs  Lab 12/29/2018 1848 01/10/2019 2124 01/06/19 0734 01/06/19 1120  GLUCAP 368* 295* 222* 254*   Lipid Profile: Recent Labs    01/06/19 0005  CHOL 104  HDL 28*  LDLCALC 56  TRIG 101  CHOLHDL 3.7   Thyroid Function Tests: Recent Labs    01/04/2019 1855 12/21/2018 1912  TSH  --  3.611  FREET4 0.60*  --    Anemia Panel: No results for input(s): VITAMINB12, FOLATE, FERRITIN, TIBC, IRON, RETICCTPCT in the last 72 hours. Urine analysis:    Component Value Date/Time  COLORURINE YELLOW 08/06/2017 [place] 08/06/2017 1512   LABSPEC 1.008 08/06/2017 1512   PHURINE 5.0 08/06/2017 1512   GLUCOSEU NEGATIVE 08/06/2017 1512   HGBUR NEGATIVE 08/06/2017 1512   [place] 08/06/2017 1512   KETONESUR NEGATIVE 08/06/2017 1512   PROTEINUR 100 (A) 08/06/2017 1512   NITRITE NEGATIVE 08/06/2017 1512   LEUKOCYTESUR NEGATIVE 08/06/2017 1512   Sepsis Labs: @LABRCNTIP (procalcitonin:4,lacticidven:4)  ) Recent Results (from the past 240 hour(s))  Novel Coronavirus, NAA (hospital order; send-out to ref lab)     Status: None   Collection Time: 12/21/2018  3:55 PM   Specimen: Nasopharyngeal Swab; Respiratory  Result Value Ref Range Status   SARS-CoV-2, NAA NOT DETECTED NOT DETECTED Final    Comment: (NOTE) This test was developed and its performance characteristics determined by [employer]. This test has not been FDA cleared or approved. This test has been authorized by FDA under an Emergency Use Authorization (EUA). This test is only  authorized for the duration of time the declaration that circumstances exist justifying the authorization of the emergency use of in vitro diagnostic tests for detection of SARS-CoV-2 virus and/or diagnosis of COVID-19 infection under section 564(b)(1) of the Act, 21 U.S.C. KA:123727), unless the authorization is terminated or revoked sooner. When diagnostic testing is negative, the possibility of a false negative result should be considered in the context of a patient's recent exposures and the presence of clinical signs and symptoms consistent with COVID-19. An individual without symptoms of COVID-19 and who is not shedding SARS-CoV-2 virus would expect to have a negative (not detected) result in this assay. Performed  At: [facility] [address] [place], [place] [id number] Rush Farmer MD [id number]    [place]  Final    Comment: Performed at [facility], [place] [address], [place], [place] [address]      Radiology Studies: Dg Chest 2 View  Result Date: 01/13/2019 CLINICAL DATA:  Chest pain, shortness of breath, left-sided chest pain, recent falls, left shoulder pain EXAM: CHEST - 2 VIEW COMPARISON:  08/06/2017 FINDINGS: Cardiomegaly. Small bilateral pleural effusions with associated atelectasis or consolidation. The visualized skeletal structures are unremarkable. IMPRESSION: 1. Cardiomegaly. Small bilateral pleural effusions with associated atelectasis or consolidation. 2. No obvious abnormality of the partially included left shoulder. Recommend dedicated shoulder radiographs if there is suspicion for fracture. Electronically Signed   By: Eddie Candle M.D.   On: 01/13/2019 14:40     Scheduled Meds: . amLODipine  2.5 mg Oral BID  . aspirin  324 mg Oral NOW   Or  . aspirin  300 mg Rectal NOW  . aspirin EC  81 mg Oral Daily  . atorvastatin  80 mg Oral q1800  . calcitRIOL  0.5 mcg Oral Daily  . carvedilol  6.25 mg Oral BID WC  .  feeding supplement (ENSURE ENLIVE)  237 mL Oral TID BM  . ferrous sulfate  325 mg Oral BID WC  . hydrALAZINE  25 mg Oral TID  . insulin aspart  0-9 Units Subcutaneous TID WC  . isosorbide mononitrate  30 mg Oral Daily  . levothyroxine  25 mcg Oral QAC breakfast  . QUEtiapine  25 mg Oral BID  . sertraline  200 mg Oral Daily  . traZODone  100 mg Oral QHS  . [START ON 01/11/2019] Vitamin D (Ergocalciferol)  50,000 Units Oral Q Fri   Continuous Infusions: . heparin 1,300 Units/hr (01/06/19 1036)     LOS: 1 day  Time Spent in minutes   45 minutes  Geron Mulford D.O. on 01/06/2019 at 1:15 PM  Between 7am to 7pm - Please see pager noted on [email or web address]  After 7pm go to [email or web address]  And look for the night coverage person covering for me after hours  [facility] Office  [phone]

## 2019-01-06 NOTE — Progress Notes (Signed)
ANTICOAGULATION CONSULT NOTE - Follow Up Consult  Pharmacy Consult for heparin Indication: chest pain/ACS  Allergies  Allergen Reactions  . Phenytoin Sodium Extended Itching  . Potassium-Containing Compounds Other (See Comments)    unspecified  . Latex Swelling    Patient Measurements: Height: 5\' 5"  (165.1 cm) Weight: 142 lb (64.4 kg) IBW/kg (Calculated) : 57 Heparin Dosing Weight: 59.6  Vital Signs: Temp: 98.4 F (36.9 C) (08/23 0419) Temp Source: Axillary (08/23 0419) BP: 111/53 (08/23 0855) Pulse Rate: 60 (08/23 0855)  Labs: Recent Labs    01/12/2019 1401 12/29/2018 1545 12/23/2018 1855 12/31/2018 2026 01/06/19 0005 01/06/19 0901  HGB 8.9*  --   --   --  7.9* 8.1*  HCT 28.2*  --   --   --  24.4* 25.6*  PLT 131*  --   --   --  124* 121*  HEPARINUNFRC  --   --   --   --  0.16* 0.15*  CREATININE 5.15*  --   --   --  5.47*  --   TROPONINIHS 2,671* 2,325* 2,099* 1,900*  --   --     Estimated Creatinine Clearance: 8.2 mL/min (A) (by C-G formula based on SCr of 5.47 mg/dL (H)).   Medications:  Scheduled:  . amLODipine  2.5 mg Oral BID  . aspirin  324 mg Oral NOW   Or  . aspirin  300 mg Rectal NOW  . aspirin EC  81 mg Oral Daily  . atorvastatin  80 mg Oral q1800  . calcitRIOL  0.5 mcg Oral Daily  . carvedilol  6.25 mg Oral BID WC  . feeding supplement (ENSURE ENLIVE)  237 mL Oral TID BM  . ferrous sulfate  325 mg Oral BID WC  . hydrALAZINE  25 mg Oral TID  . insulin aspart  0-9 Units Subcutaneous TID WC  . isosorbide mononitrate  30 mg Oral Daily  . levothyroxine  25 mcg Oral QAC breakfast  . QUEtiapine  25 mg Oral BID  . sertraline  200 mg Oral Daily  . traZODone  100 mg Oral QHS  . [START ON 01/11/2019] Vitamin D (Ergocalciferol)  50,000 Units Oral Q Fri   Infusions:  . sodium chloride 75 mL/hr at 01/06/19 0019  . heparin 1,050 Units/hr (01/06/19 0110)    Assessment: 56 yof presented to the ED with SOB and fatigue. Troponin elevated (2,325>>1,900) and now  starting IV heparin. Baseline Hgb and platelets are low. No bleeding or interruptions with infusion per RN. She is not on anticoagulation PTA.  Heparin level was subtherapeutic (0.15) on a rate of 1,050 units/hr.   Goal of Therapy:  Heparin level 0.3-0.7 units/ml Monitor platelets by anticoagulation protocol: Yes   Plan:  Will not bolus given low hg. Increase heparin infusion to1,300 units/hr Check anti-Xa level in 8 hours and daily while on heparin Monitor CBC daily Watch for signs/symptoms of bleeding  Sherren Kerns, PharmD PGY1 [place] Resident [phone] 01/06/2019,10:17 AM

## 2019-01-06 NOTE — Progress Notes (Signed)
[facility] Progress Note  Subjective: pt is "tired", brother here , told may be palliative meeting today, she says she is "too tired to talk".  She and her brother are asking for pain medciations  Vitals:   01/06/19 0751 01/06/19 0855 01/06/19 1132 01/06/19 1319  BP: 122/76 (!) 111/53  (!) 106/56  Pulse: 63 60  64  Resp:      Temp:   98.9 F (37.2 C) 98.9 F (37.2 C)  TempSrc:   Oral Oral  SpO2:      Weight:      Height:        Inpatient medications: . amLODipine  2.5 mg Oral BID  . aspirin  324 mg Oral NOW   Or  . aspirin  300 mg Rectal NOW  . aspirin EC  81 mg Oral Daily  . atorvastatin  80 mg Oral q1800  . calcitRIOL  0.5 mcg Oral Daily  . carvedilol  6.25 mg Oral BID WC  . feeding supplement (ENSURE ENLIVE)  237 mL Oral TID BM  . ferrous sulfate  325 mg Oral BID WC  . hydrALAZINE  25 mg Oral TID  . insulin aspart  0-9 Units Subcutaneous TID WC  . isosorbide mononitrate  30 mg Oral Daily  . levothyroxine  25 mcg Oral QAC breakfast  . QUEtiapine  25 mg Oral BID  . sertraline  200 mg Oral Daily  . traZODone  100 mg Oral QHS  . [START ON 01/11/2019] Vitamin D (Ergocalciferol)  50,000 Units Oral Q Fri   . heparin 1,300 Units/hr (01/06/19 1036)   acetaminophen, albuterol, HYDROmorphone (DILAUDID) injection, hydrOXYzine, nitroGLYCERIN, ondansetron (ZOFRAN) IV, ondansetron, sodium chloride    Exam: Gen eldelry frail WF very tired appearing No rash, cyanosis or gangrene Sclera anicteric, throat clear and dry  No jvd or bruits Chest clear bilat to bases RRR no MRG Abd soft ntnd no mass or ascites +bs GU defer MS no joint effusions or deformity Ext no LE or UE edema, no wounds or ulcers Neuro is alert, Ox 3 , nf, +asterixis moderate severity      Home meds:  - amlodipine 5 bid/ carvedilol 12.5 bid/ hydralazine 100 tid/ torsemide 40 qd  - insulin aspart 4-8 u tid ac  - trazodone 100 hs/ sertraline 200 qd/ oxycodone IR 5 qid prn/ gabapentin  100 qd/ quetiapine 25 bid  - aspirin 81 qd/ atorvastatin 80 qd/ isosorbide mononitrate 30 qd  - levothyroxine 25 ug qd/ lactulose 10 gm bid  - prn ipratropium-albuterol 0.5- 2.5 qid prn  - prn's/ vitamins/ supplements  Date                Creat               eGFR  2016               1.25- 1.44          2018               1.80- 1.97        28  2019 jan-mar   2.11- 3.72       17- 27  2019 apr-dec   2.47- 3.97       12- 06 Jul 2018        2.49                 21, stage IV CKD  Dec 27 2018   3.00                   Jan 05, 2019  5.15                8              Assessment/ Plan: 1. AoCKD4 - acute renal failure vs progression to CKDV (esrd). Pt is likely uremic w/ asterixis, nausea, fatigue and ^'d drowsiness. Creat is rising today. She likely had recent MI.  She has stated quite clearly that she does not want to do dialysis, feels she is not strong enough for it. Also she is tired of needles and lab draws. She wishes to transition to comfort care. I have discussed all this again w/ her and her brother today, he is supportive of her choices. Palliative team is planning to set up a meeting for tomorrow. Brother says he would like to take her home. Will write orders for comfort care.    2. HTN  3. Chest pain / +trop  4. DM2 5. Chronic pain 6. H/o RA/ SLE / [facility] 01/06/2019, 1:54 PM  Iron/TIBC/Ferritin/ %Sat    Component Value Date/Time   IRON 82 12/19/2018 1220   TIBC 196 (L) 12/19/2018 1220   FERRITIN 343 (H) 12/19/2018 1220   IRONPCTSAT 42 (H) 12/19/2018 1220   Recent Labs  Lab 01/06/19 0005  NA 130*  K 4.9  CL 98  CO2 19*  GLUCOSE 270*  BUN 88*  CREATININE 5.47*  CALCIUM 8.4*   No results for input(s): AST, ALT, ALKPHOS, BILITOT, PROT in the last 168 hours. Recent Labs  Lab 01/06/19 0901  WBC 9.5  HGB 8.1*  HCT 25.6*  PLT 121*

## 2019-01-06 NOTE — Progress Notes (Signed)
[facility] Progress Note  Subjective: pt is "tired", brother here , told may be palliative meeting today, she says she is "too tired to talk".  She and her brother are asking for pain medciations  Vitals:   01/06/19 0419 01/06/19 0751 01/06/19 0855 01/06/19 1132  BP:  122/76 (!) 111/53   Pulse:  63 60   Resp:      Temp: 98.4 F (36.9 C)   98.9 F (37.2 C)  TempSrc: Axillary   Oral  SpO2: 92%     Weight: 64.4 kg     Height:        Inpatient medications: . amLODipine  2.5 mg Oral BID  . aspirin  324 mg Oral NOW   Or  . aspirin  300 mg Rectal NOW  . aspirin EC  81 mg Oral Daily  . atorvastatin  80 mg Oral q1800  . calcitRIOL  0.5 mcg Oral Daily  . carvedilol  6.25 mg Oral BID WC  . feeding supplement (ENSURE ENLIVE)  237 mL Oral TID BM  . ferrous sulfate  325 mg Oral BID WC  . hydrALAZINE  25 mg Oral TID  . insulin aspart  0-9 Units Subcutaneous TID WC  . isosorbide mononitrate  30 mg Oral Daily  . levothyroxine  25 mcg Oral QAC breakfast  . QUEtiapine  25 mg Oral BID  . sertraline  200 mg Oral Daily  . traZODone  100 mg Oral QHS  . [START ON 01/11/2019] Vitamin D (Ergocalciferol)  50,000 Units Oral Q Fri   . heparin 1,300 Units/hr (01/06/19 1036)   acetaminophen, albuterol, HYDROmorphone (DILAUDID) injection, hydrOXYzine, nitroGLYCERIN, ondansetron (ZOFRAN) IV, ondansetron, sodium chloride    Exam: Gen eldelry frail WF very tired appearing No rash, cyanosis or gangrene Sclera anicteric, throat clear and dry  No jvd or bruits Chest clear bilat to bases RRR no MRG Abd soft ntnd no mass or ascites +bs GU defer MS no joint effusions or deformity Ext no LE or UE edema, no wounds or ulcers Neuro is alert, Ox 3 , nf, +asterixis moderate severity      Home meds:  - amlodipine 5 bid/ carvedilol 12.5 bid/ hydralazine 100 tid/ torsemide 40 qd  - insulin aspart 4-8 u tid ac  - trazodone 100 hs/ sertraline 200 qd/ oxycodone IR 5 qid prn/ gabapentin  100 qd/ quetiapine 25 bid  - aspirin 81 qd/ atorvastatin 80 qd/ isosorbide mononitrate 30 qd  - levothyroxine 25 ug qd/ lactulose 10 gm bid  - prn ipratropium-albuterol 0.5- 2.5 qid prn  - prn's/ vitamins/ supplements  Date                Creat               eGFR  2016               1.25- 1.44          2018               1.80- 1.97        28  2019 jan-mar   2.11- 3.72       17- 27  2019 apr-dec   2.47- 3.97       12- 06 Jul 2018        2.49                 21, stage IV CKD  Dec 27 2018   3.00                   Jan 05, 2019  5.15                8              Assessment/ Plan: 1. AoCKD4 - acute renal failure vs progression to CKDV (esrd). Pt is likely uremic w/ asterixis, nausea, fatigue and ^'d drowsiness. Creat is rising today. She likely had recent MI.  She has stated quite clearly that she does not want to do dialysis, feels she is not strong enough for it. Also she is tired of needles and lab draws. She wishes to transition to comfort care. I have discussed all this again w/ her and her brother today, he is supportive of her choices. Called palliative and they are planning to set up a meeting for tomorrow. Brother says he would like to take her home. Will write orders for comfort care.    2. HTN  3. Chest pain / +trop  4. DM2 5. Chronic pain 6. H/o RA/ SLE / [facility] 01/06/2019, 1:11 PM  Iron/TIBC/Ferritin/ %Sat    Component Value Date/Time   IRON 82 12/19/2018 1220   TIBC 196 (L) 12/19/2018 1220   FERRITIN 343 (H) 12/19/2018 1220   IRONPCTSAT 42 (H) 12/19/2018 1220   Recent Labs  Lab 01/06/19 0005  NA 130*  K 4.9  CL 98  CO2 19*  GLUCOSE 270*  BUN 88*  CREATININE 5.47*  CALCIUM 8.4*   No results for input(s): AST, ALT, ALKPHOS, BILITOT, PROT in the last 168 hours. Recent Labs  Lab 01/06/19 0901  WBC 9.5  HGB 8.1*  HCT 25.6*  PLT 121*

## 2019-01-06 NOTE — Progress Notes (Signed)
   01/06/19 2224  Clinical Encounter Type  Visited With Patient  Visit Type Spiritual support  Referral From Nurse  Consult/Referral To Chaplain  Spiritual Encounters  Spiritual Needs Emotional;Grief support  Asked Ms. Stacy Smith if she would like prayer and she said yes. Sat with her and held her hand upon request for comfort.

## 2019-01-06 NOTE — Progress Notes (Signed)
Discussed with Dr. Ree Kida.  Read Dr. Barkley Bruns note.  Reviewed Epic chart.  Received a call from beside RN Yoko about visiting restrictions.  Family is asking for an exception.  I explained that Stacy Smith is now comfort care and she is allowed to have 4 visitors in the hospital.  They must be the same 4 visitors.  Yoko agreed to call the family back and update them on the visitor restrictions.  I spoke with the patient's brother Stacy Smith.  He is concerned about trying to take her home as he believes she is too fragile and will not last more than a day or two.   He states the patient is too tired for any visitors.    We made a plan to meet in person tomorrow morning at 10:00 am to reassess and make plans.  Appreciate Nephrology progressing her to comfort measures.  Florentina Jenny, PA-C Palliative Medicine Pager: [phone]  No charge note.

## 2019-01-06 NOTE — Progress Notes (Signed)
ANTICOAGULATION CONSULT NOTE  Pharmacy Consult for heparin Indication: chest pain/ACS   Patient Measurements: Height: 5\' 5"  (165.1 cm) Weight: 131 lb 6.3 oz (59.6 kg) IBW/kg (Calculated) : 57 Heparin Dosing Weight: 62.8kg  Vital Signs: Temp: 98.1 F (36.7 C) (08/22 2123) Temp Source: Oral (08/22 2123) BP: 108/57 (08/22 2123) Pulse Rate: 56 (08/22 2123)  Labs: Recent Labs    01/08/2019 1401 01/08/2019 1545 01/11/2019 1855 12/30/2018 2026 01/06/19 0005  HGB 8.9*  --   --   --  7.9*  HCT 28.2*  --   --   --  24.4*  PLT 131*  --   --   --  124*  HEPARINUNFRC  --   --   --   --  0.16*  CREATININE 5.15*  --   --   --  5.47*  TROPONINIHS 2,671* 2,325* 2,099* 1,900*  --      Medical History: Past Medical History:  Diagnosis Date  . Anxiety   . Bradycardia   . CHF (congestive heart failure) ([place])   . Chronic kidney disease    CKD Stage 3  . Depression   . Diabetes mellitus without complication ([place])   . Hyperkalemia   . Hypertension   . Lupus ([place])   . Peripheral neuropathy   . Rheumatoid arthritis ([place])   . Seizures ([place])   . Shortness of breath dyspnea      Assessment: 84 yof presented to the ED with SOB and fatigue. Troponin elevated and now starting IV heparin. Baseline Hgb and platelets are low. No bleeding noted. She is not on anticoagulation PTA.  Initial heparin level is subtherapeutic. Noted hgb 7.9 - will need to watch.   Goal of Therapy:  Heparin level 0.3-0.7 units/ml Monitor platelets by anticoagulation protocol: Yes   Plan:  -Increase heparin to 1050 units/hr -Daily HL, CBC -Check level in 8 hours   Harvel Quale 01/06/2019,1:02 AM

## 2019-01-08 ENCOUNTER — Encounter (HOSPITAL_COMMUNITY): Payer: Medicare HMO | Admitting: Cardiology

## 2019-01-15 NOTE — Progress Notes (Signed)
Late Entry - patient expired at 0600. Pronounced by Janeal Holmes, RN and Yetta Glassman, RN. Donor services called per protocol. Pt's son made aware by primary RN, Timmothy Sours. MD Bodenheimer made aware. Death certificate signed and sent with patient to the morgue.

## 2019-01-15 NOTE — Progress Notes (Deleted)
Progress Note  Patient Name: Stacy Smith Date of Encounter: 01/20/19  Primary Cardiologist: Carlyle Dolly, MD   Subjective    Inpatient Medications    Scheduled Meds: . insulin aspart  0-9 Units Subcutaneous TID WC  . QUEtiapine  25 mg Oral BID  . sertraline  200 mg Oral Daily  . traZODone  100 mg Oral QHS   Continuous Infusions:  PRN Meds: acetaminophen, albuterol, HYDROmorphone (DILAUDID) injection, hydrOXYzine, morphine injection, nitroGLYCERIN, ondansetron (ZOFRAN) IV, ondansetron, sodium chloride   Vital Signs    Vitals:   01/06/19 0855 01/06/19 1132 01/06/19 1319 20-Jan-2019 0600  BP: (!) 111/53  (!) 106/56   Pulse: 60  64   Resp:      Temp:  98.9 F (37.2 C) 98.9 F (37.2 C)   TempSrc:  Oral Oral   SpO2:      Weight:    64.4 kg  Height:    5\' 5"  (1.651 m)    Intake/Output Summary (Last 24 hours) at 01-20-2019 0736 Last data filed at 01/06/2019 1800 Gross per 24 hour  Intake 1100 ml  Output 140 ml  Net 960 ml   Last 3 Weights 01-20-2019 01/06/2019 12/22/2018  Weight (lbs) 141 lb 15.6 oz 142 lb 131 lb 6.3 oz  Weight (kg) 64.4 kg 64.411 kg 59.6 kg      Telemetry     - Personally Reviewed  ECG     - Personally Reviewed  Physical Exam   GEN: No acute distress.   Neck: No JVD Cardiac: RRR, no murmurs, rubs, or gallops.  Respiratory: Clear to auscultation bilaterally. GI: Soft, nontender, non-distended  MS: No edema; No deformity. Neuro:  Nonfocal  Psych: Normal affect   Labs    High Sensitivity Troponin:   Recent Labs  Lab 01/04/2019 1401 01/14/2019 1545 01/13/2019 1855 01/06/2019 2026  TROPONINIHS 2,671* 2,325* 2,099* 1,900*      Cardiac EnzymesNo results for input(s): TROPONINI in the last 168 hours. No results for input(s): TROPIPOC in the last 168 hours.   Chemistry Recent Labs  Lab 01/02/19 1243 12/27/2018 1401 01/06/19 0005  NA 137 131* 130*  K 4.3 5.0 4.9  CL  --  99 98  CO2  --  17* 19*  GLUCOSE 279* 363* 270*  BUN  --   80* 88*  CREATININE  --  5.15* 5.47*  CALCIUM  --  8.8* 8.4*  GFRNONAA  --  8* 7*  GFRAA  --  9* 8*  ANIONGAP  --  15 13     Hematology Recent Labs  Lab 01/03/2019 1401 01/06/19 0005 01/06/19 0901  WBC 12.9* 9.3 9.5  RBC 3.00* 2.63* 2.78*  HGB 8.9* 7.9* 8.1*  HCT 28.2* 24.4* 25.6*  MCV 94.0 92.8 92.1  MCH 29.7 30.0 29.1  MCHC 31.6 32.4 31.6  RDW 14.6 14.6 14.5  PLT 131* 124* 121*    BNPNo results for input(s): BNP, PROBNP in the last 168 hours.   DDimer No results for input(s): DDIMER in the last 168 hours.   Radiology    Dg Chest 2 View  Result Date: 01/01/2019 CLINICAL DATA:  Chest pain, shortness of breath, left-sided chest pain, recent falls, left shoulder pain EXAM: CHEST - 2 VIEW COMPARISON:  08/06/2017 FINDINGS: Cardiomegaly. Small bilateral pleural effusions with associated atelectasis or consolidation. The visualized skeletal structures are unremarkable. IMPRESSION: 1. Cardiomegaly. Small bilateral pleural effusions with associated atelectasis or consolidation. 2. No obvious abnormality of the partially included left shoulder. Recommend dedicated  shoulder radiographs if there is suspicion for fracture. Electronically Signed   By: Eddie Candle M.D.   On: 01/01/2019 14:40    Cardiac Studies     Patient Profile     73 y.o. female history of chronic diastolic congestive heart failure, chronic kidney disease was admitted 12/29/2018 with chest pain, elevated troponin levels and acute on chronic renal insufficiency with late presenting MI and AKI.  Not a candidate for dialysis and has been made DNR. Palliative care has seen.   Assessment & Plan    Acute on chronic combined systolic and diastolic HF.  EF around [id number], most likely due to MI around 12/24/18.  Renal following   AKI and not candidate for dialysis - is DNR Cr 5.47   CAD thought occluded LAD unable to do cath due to renal issues. Troponin HS pk 2671   Anemia with hgb 8.1  thrombocytopenia with plts 121  DM-2  per primary team.        For questions or updates, please contact [place] Please consult [email or web address] for contact info under        Signed, Cecilie Kicks, NP  01-25-2019, 7:36 AM

## 2019-01-15 NOTE — Discharge Summary (Signed)
Death Summary  Stacy Smith [id number] DOB: [birth year] DOA: January 23, 2019  PCP: Jacqualine Code, DO PCP/Office notified:   Admit date: Jan 23, 2019 Date of Death: 2019/01/25  Final Diagnoses:  NSTEMI/Chest pain Acute kidney injury on chronic kidney disease, stage IV vs progression to CKD V Dyspnea Chronic diastolic heart failure Mild hyponatremia Leukocytosis Chronic normocytic anemia Essential hypertension Hyperlipidemia Hypothyroidism Diabetes mellitus, type II Liver cirrhosis Goals of care   History of present illness:  on 2019-01-23  Stacy Smith a 73 y.o.femalewith a medical history of, CKD, diabetes, hypertension, who presented to the emergency department with complaints of shortness of breath and chest pain which is been ongoing for several days. Patient states over the last few days, her breathing has worsened. She recently was told to take increased amounts of her diuretics but cannot remember when. She did this for approximately 3 days. Patient also states that she was recently taking ibuprofen for pain. Or historian at this time. Her brother is at [place] me that she recently saw Dr. Justin Mend a few weeks ago,and that her last creatinine was approximately 3.  Patient states that she does have worsening chest pain with deep inspiration. She has not tried taking much for this. Nothing seems to make it better. Currently she denies any recent illness or travel, sick contacts, abdominal pain, nausea or vomiting, diarrhea or constipation, dizziness or headache, problems with urination.  Hospital Course:  Patient was admitted with chest pain and found to have elevated troponin.  Troponin was cycled and was trending downward.  Unfortunately, patient did have chronic kidney disease, stage IV at baseline which was also worsening.  Despite getting IV fluids, the creatinine continued to worsen.  Nephrology and cardiology were both consulted.  Patient decided she  did not want dialysis.  At that point long conversation with the patient regarding CODE STATUS and goals of care was had, patient was changed to DNR.  Palliative care was also consulted.  Patient was then transition to comfort care.  She passed away at approximately 6 AM this morning.  Death certificate completed.  NSTEMI/Chest pain -Presented with chest pain and shortness of breath that has been ongoing for several days -EKG did not show acute changes -Initial troponin (see high-sensitivity) 2671, and trending down -Echocardiogram: EF of 30 to 35%.  Moderate akinesis LV, entire anteroseptal wall.  Moderate dyskinesis of left ventricle, mid apical anterior septal wall and apical segment. 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 Justin Mend -Last creatinine approximately 3 -Patient does admit to using ibuprofen along with increased diuretics over the last several days -On admission, creatinine 5.15- despite IVF, creatinine up to 5.47 -Continue IV fluids -Monitor BMP -Nephrologyconsulted and appreciated. Feels patient has uremia with asterixis -patient states she does not want hemodialysis and tells me that she was told she had small veins and arteries   Dyspnea -Wheezing has resolved -Do not feel this is secondary to heart failure exacerbation. Patient appears to be dry on examination -Chest x-ray does not show volume overload, infection or infiltrate. -Continue albuterol metered-dose inhaler  Chronic diastolic heart failure -Last echocardiogram 08/07/2017 showed normal EF, grade 2 diastolic dysfunction. PA peak pressure of 50 mmHg -Given acute kidney injury, diuretics held at this time -Monitor intake and output, daily weights  Mild hyponatremia -sodium 130, continue IVF and monitor BMP  Leukocytosis -WBC  was  12.9, suspect reactive to the above -Resolved   Chronic normocytic anemia -Hemoglobin currently 8.1, continue to monitor CBC -Continue supplemental iron  Essential hypertension -Continue Coreg, amlodipine, hydralazine  Hyperlipidemia -Continue statin -Obtain lipid panel  Hypothyroidism -Continue Synthroid -TSH 3.611, Ft4 0.6   Diabetes mellitus, type II -Placed on insulin sliding scale with CBG monitoring -Hemoglobin A1c 7.9  Liver cirrhosis -lactulose and diureticsheld -Continue to monitor   Goals of care -Discussed code status with patient, states she does not want compressions or intubation- have changed code status to DNR -palliative care consulted for [place] discussion   Time: 10 minutes  Signed:  Cristal Ford  [facility] 01-11-19, 1:01 PM

## 2019-01-15 DEATH — deceased

## 2019-01-16 ENCOUNTER — Ambulatory Visit (HOSPITAL_COMMUNITY): Payer: Medicare HMO

## 2019-01-16 ENCOUNTER — Other Ambulatory Visit (HOSPITAL_COMMUNITY): Payer: Medicare HMO

## 2019-03-02 IMAGING — US US EXTREM UP VEIN MAPPING
1 series · 13 of 24 positions shown · non-contrast
Comparison: None.

CLINICAL DATA: 72-year-old female for upper extremity vascular
mapping prior to dialysis circuit

EXAM:
US EXTREM UP VASCULAR MAPPING

[Series 1: us extrem up vein mapping · 13 of 70 slices shown]
[im 1/70]
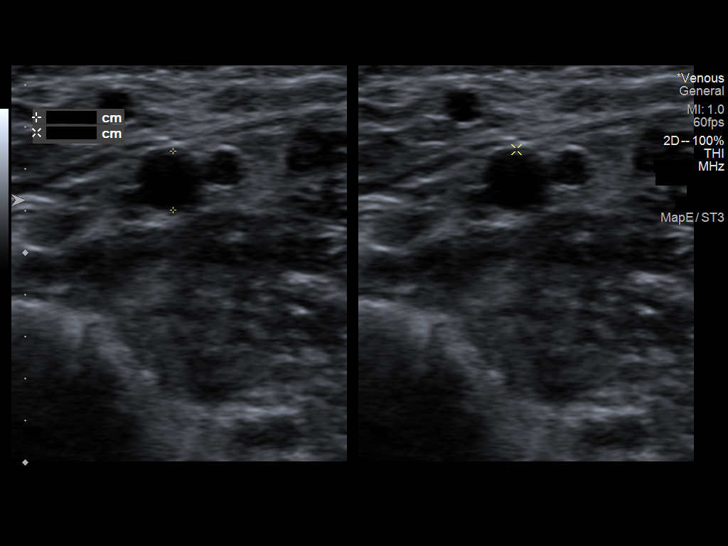
[im 7/70]
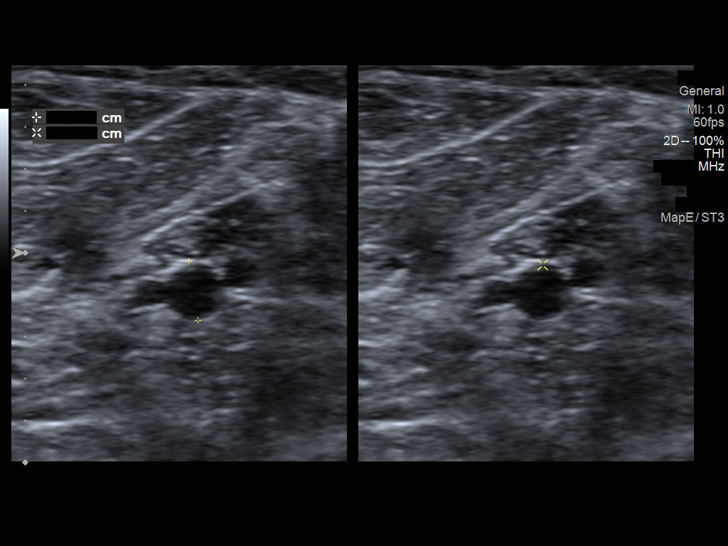
[im 13/70]
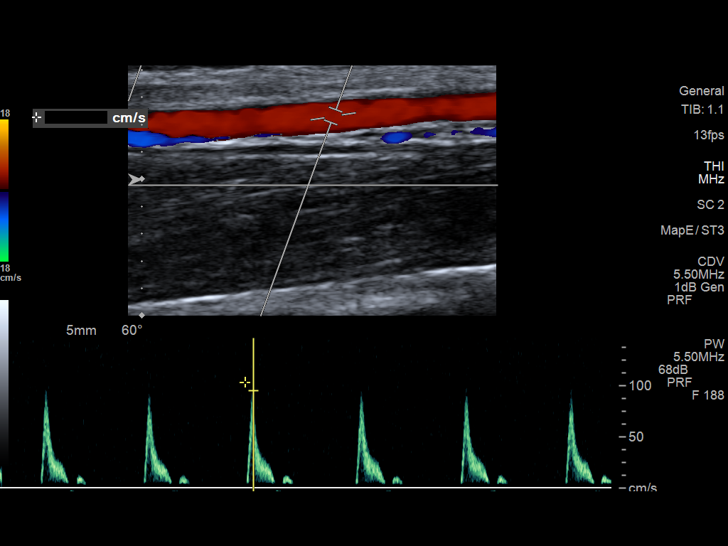
[im 19/70]
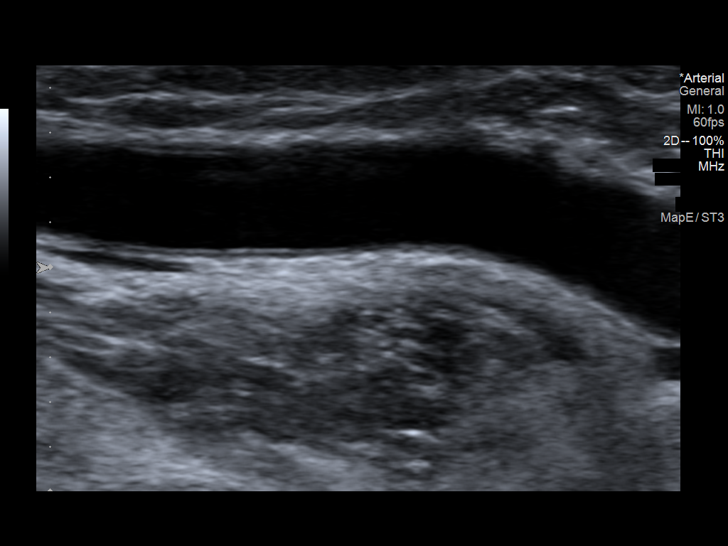
[im 25/70]
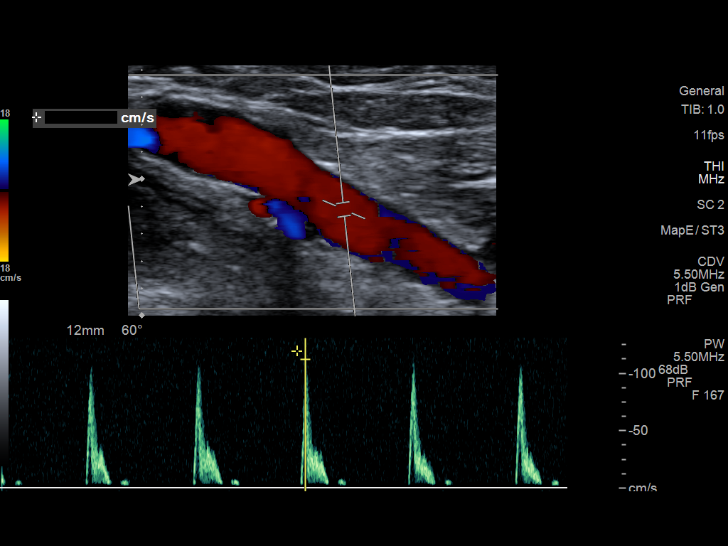
[im 31/70]
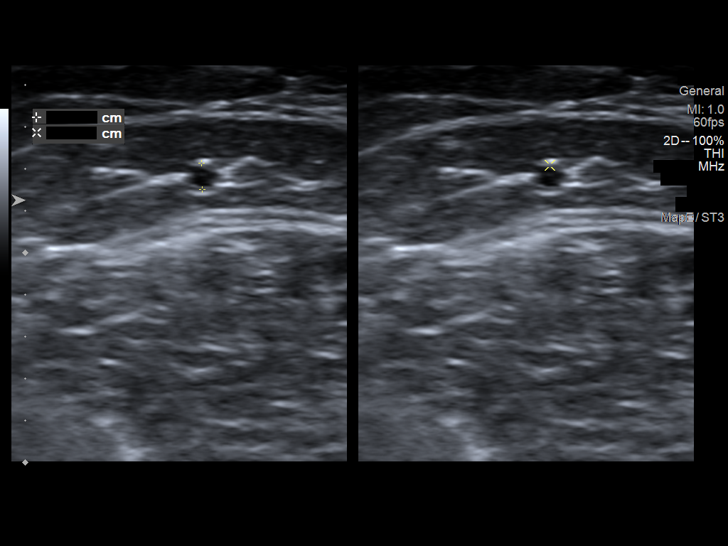
[im 37/70]
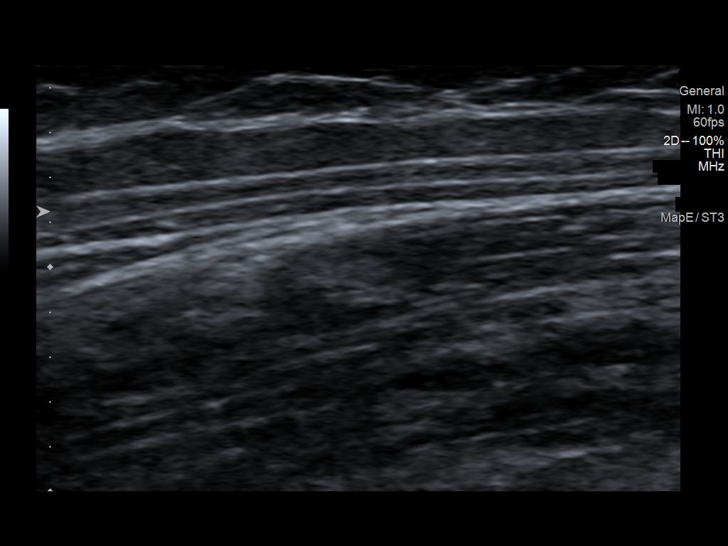
[im 40/70]
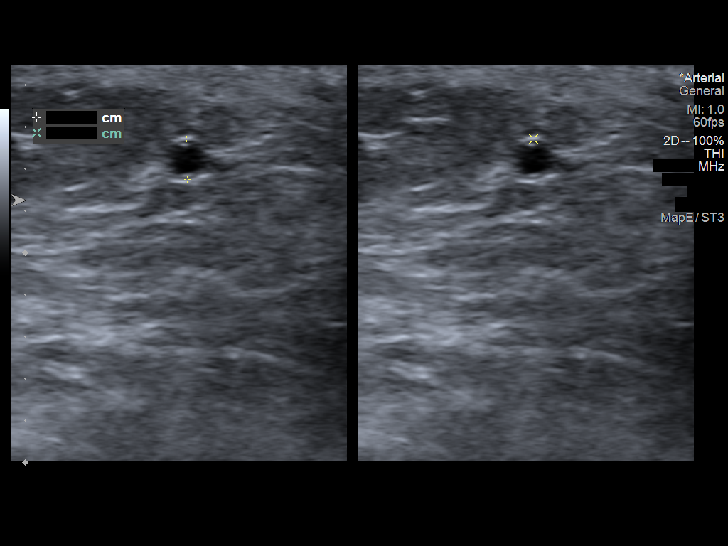
[im 46/70]
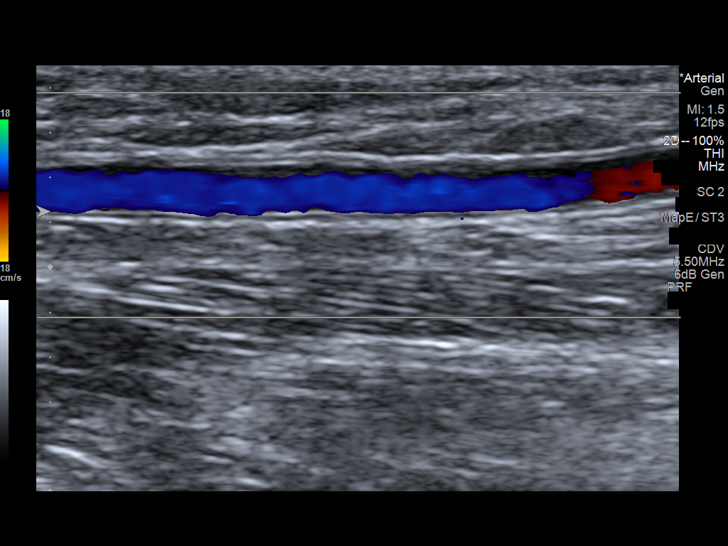
[im 52/70]
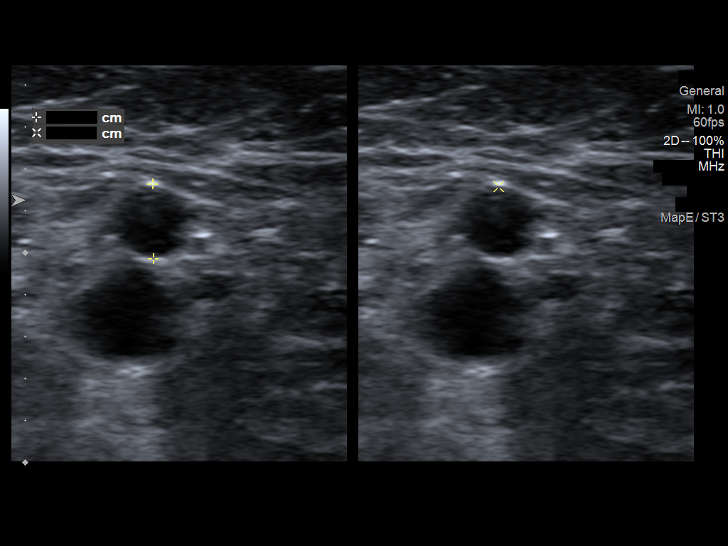
[im 58/70]
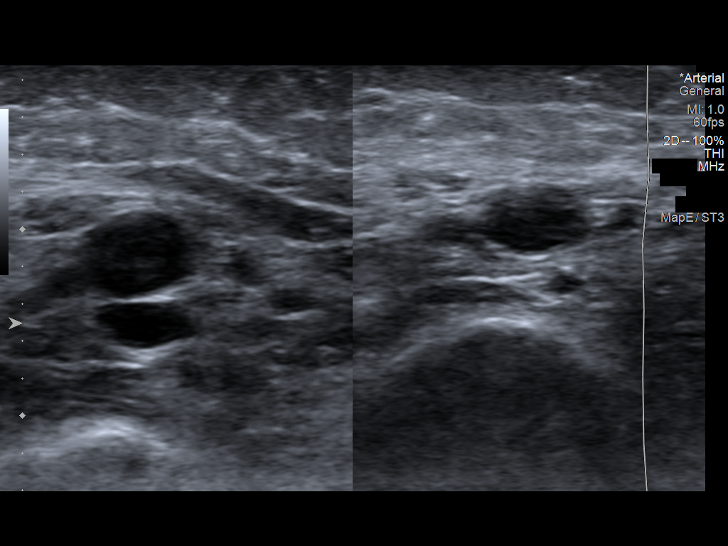
[im 64/70]
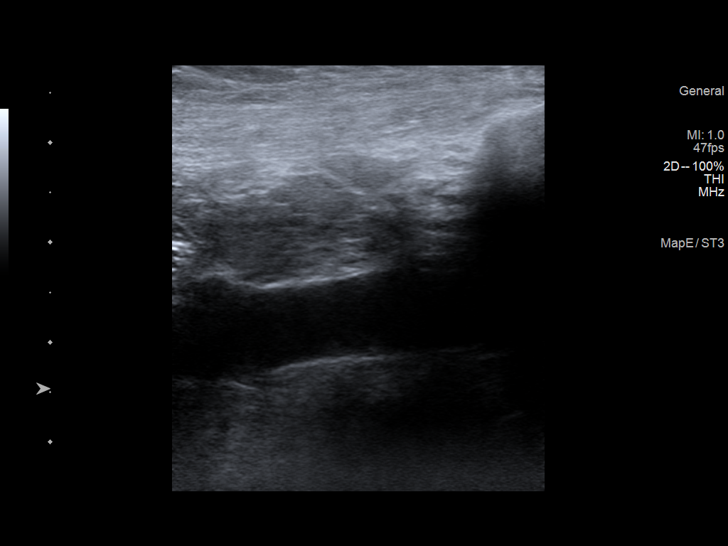
[im 70/70]
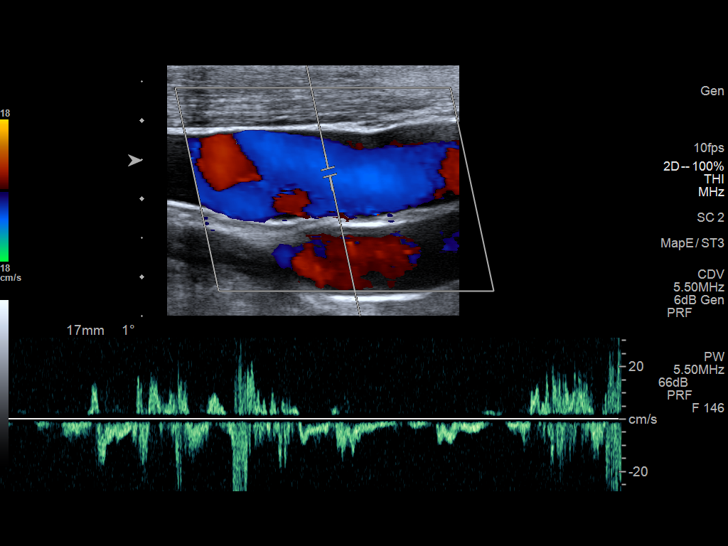

[13 of 24 positions shown; findings below may reference images not displayed]

FINDINGS: RIGHT ARTERIES

Wrist Radial Artery:

Size 2.8mm Waveform Triphasic

Wrist Ulnar Artery:

Size 2.4mm Waveform Triphasic

Prox. Forearm Radial Artery:

Size 3.4mm Waveform Triphasic

Upper Arm Brachial Artery:

Size 4.9mm Waveform Triphasic

RIGHT VEINS

Forearm Cephalic Vein:

Prox 1.5mm Distal 1.2mm Depth 2.1mm

Upper Arm Cephalic Vein:

Prox 1.3mm Distal 1.1mm Depth 4.8mm

Upper Arm Basilic Vein:

Prox 2.6mm Distal 2.4mm Depth 5.2mm

Upper Arm Brachial Vein:

Prox 2.6mm Distal 3.6mm Depth 6.8mm

ADDITIONAL RIGHT VEINS

Axillary Vein:  2.4mm

Subclavian Vein:

Patient: Yes Respiratory Phasicity: Present

Internal Jugular Vein:

Patent: Yes    Respiratory Phasicity: Present

LEFT ARTERIES

Wrist Radial Artery:

Size 2.6mm Waveform Triphasic

Wrist Ulnar Artery:

Size 2.3mm Waveform Triphasic

Prox. Forearm Radial Artery:

Size 2.9mm Waveform Triphasic

Upper Arm Brachial Artery:

Size 4.4mm Waveform Triphasic

LEFT VEINS

Forearm Cephalic Vein:

Prox 1.1mm Distal 1.4mm Depth 1.0mm

Upper Arm Cephalic Vein:

Prox 1.2mm Distal 1.2mm Depth 3.6mm

Upper Arm Basilic Vein:

Prox 2.0mm Distal 1.7mm Depth 7.2mm

Upper Arm Brachial Vein:

Prox 3.1mm Distal 3.6mm Depth 8.4mm

ADDITIONAL LEFT VEINS

Axillary Vein:  2.5mm

Subclavian Vein:

Patient: Yes Respiratory Phasicity: Present

Internal Jugular Vein:

Patent: Yes    Respiratory Phasicity: Present
IMPRESSION: Bilateral upper extremity vascular mapping, as a planning study for
surgical creation of dialysis circuit.

## 2019-10-25 IMAGING — US US RENAL
1 series · 14 of 25 positions shown · non-contrast
Comparison: CT 06/17/2017

CLINICAL DATA: Acute renal insufficiency.

EXAM:
RENAL / URINARY TRACT ULTRASOUND COMPLETE

[Series 1: us renal · 0.25mm/px · 14 of 45 slices shown]
[im 1/45]
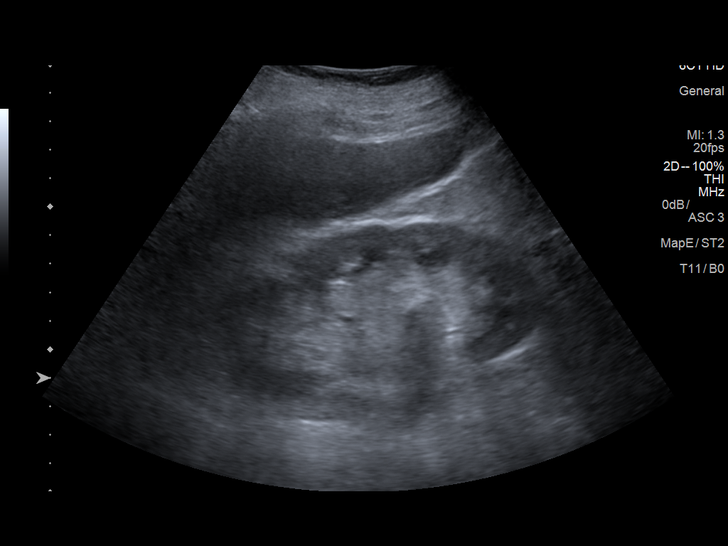
[im 4/45]
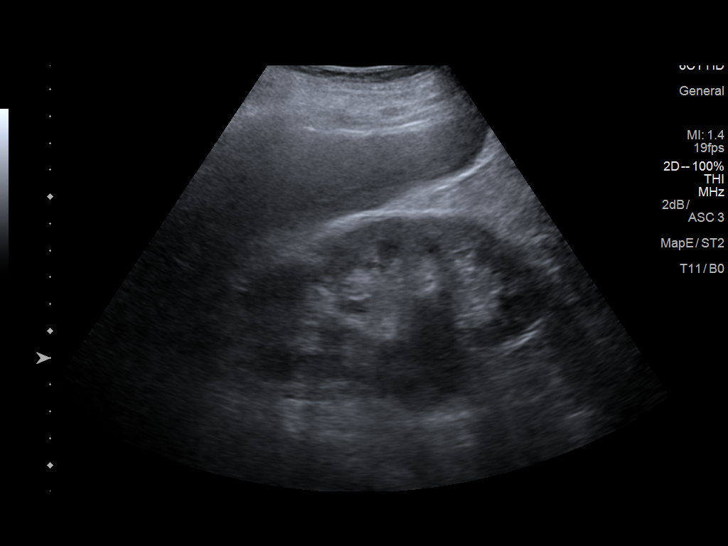
[im 8/45]
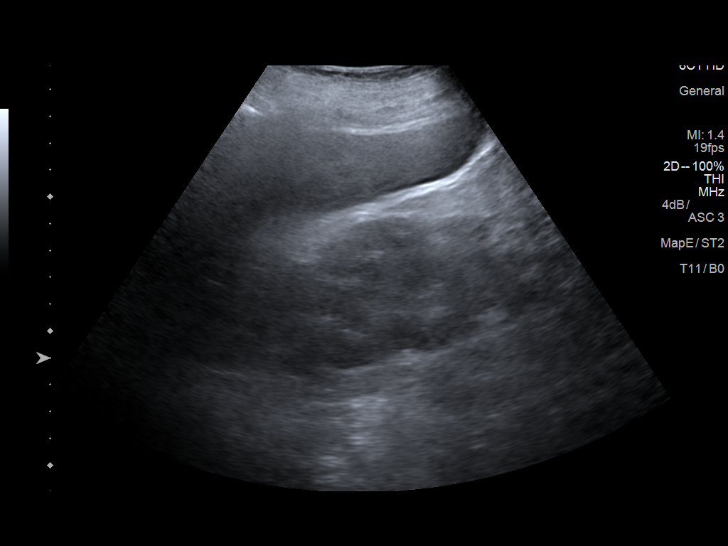
[im 12/45]
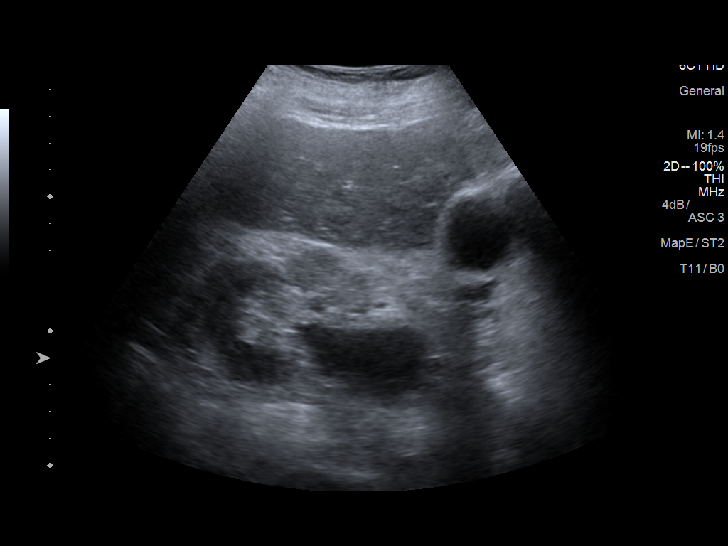
[im 15/45]
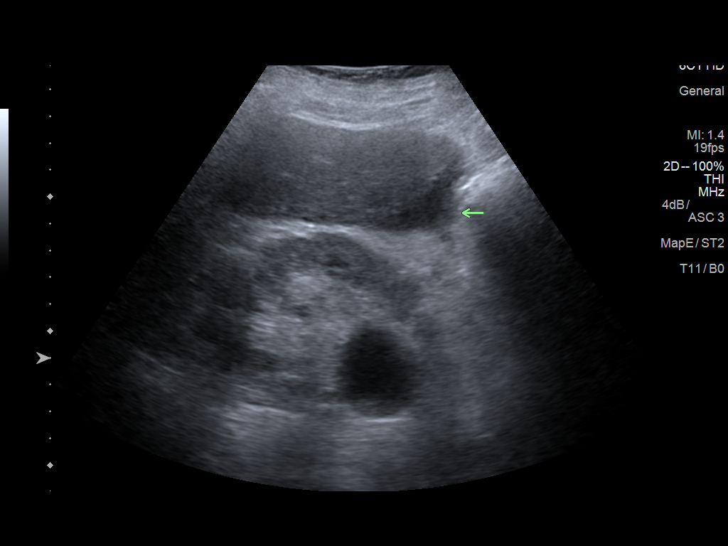
[im 17/45]
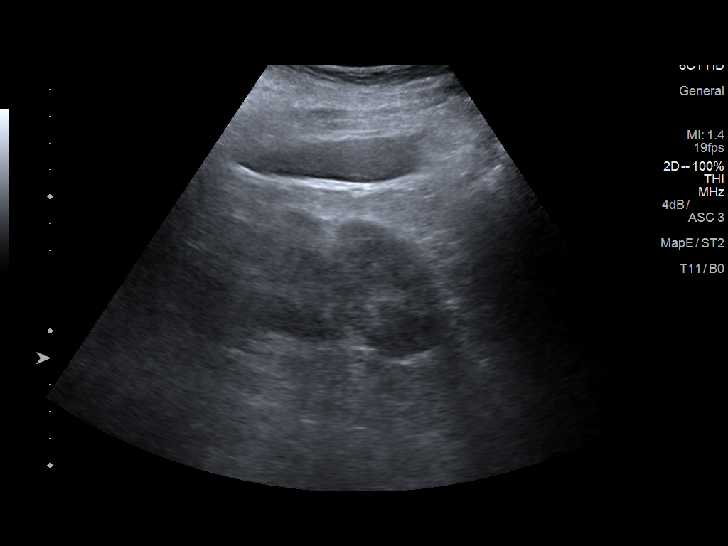
[im 21/45]
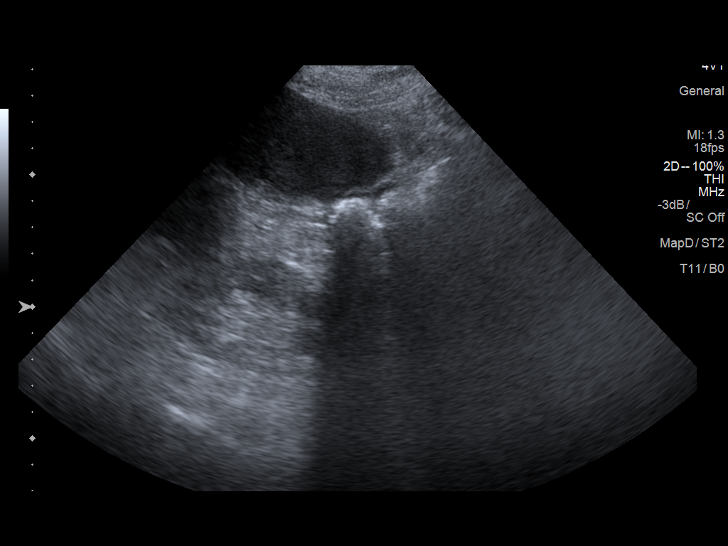
[im 24/45]
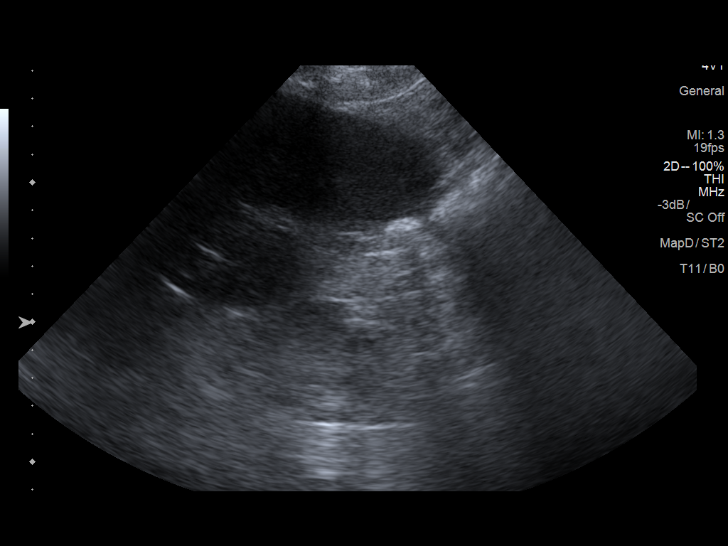
[im 28/45]
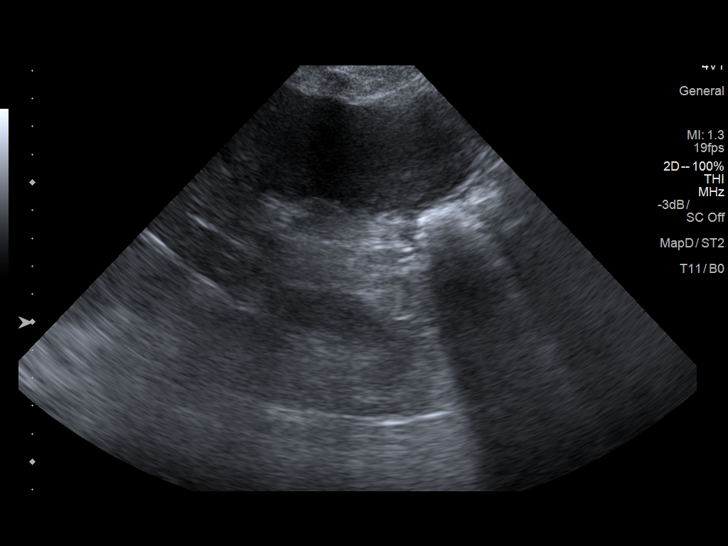
[im 30/45]
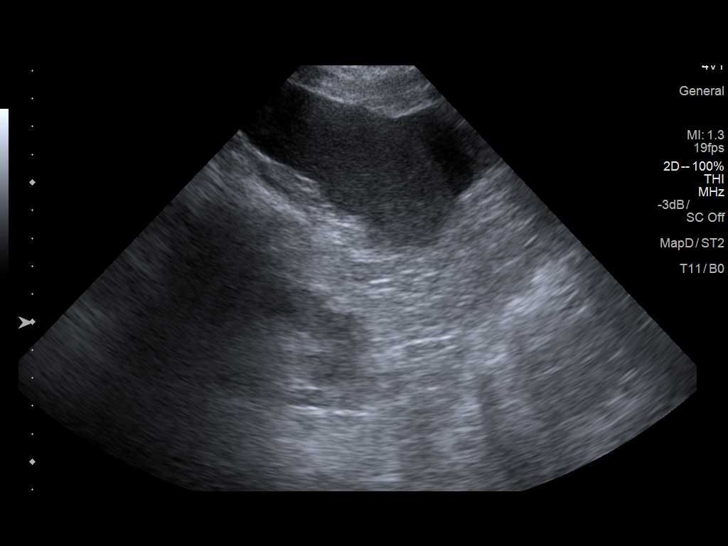
[im 34/45]
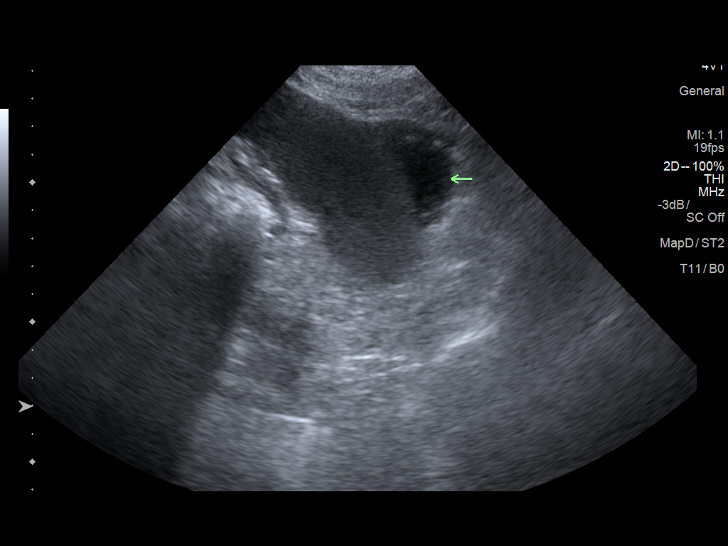
[im 37/45]
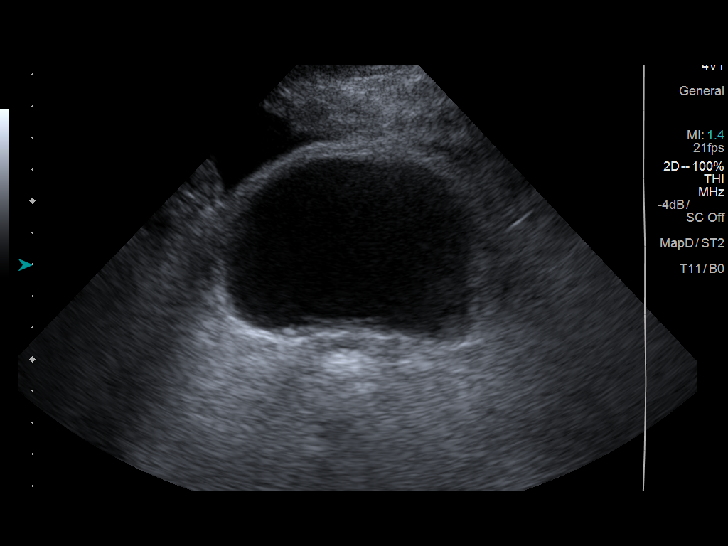
[im 41/45]
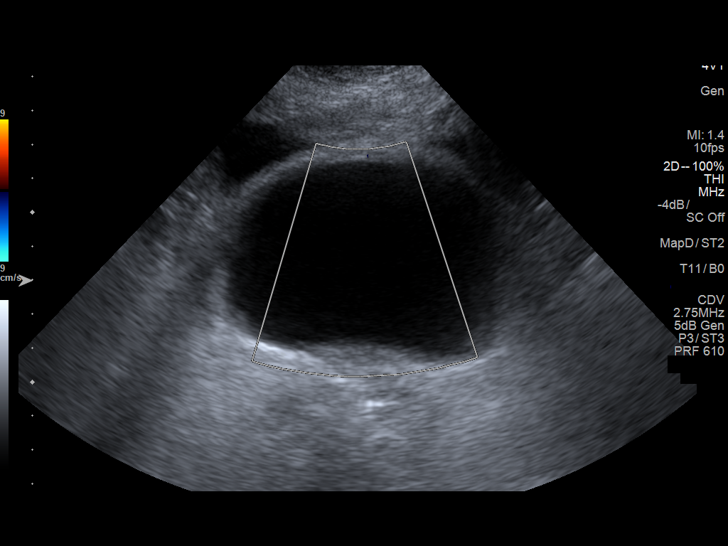
[im 45/45]
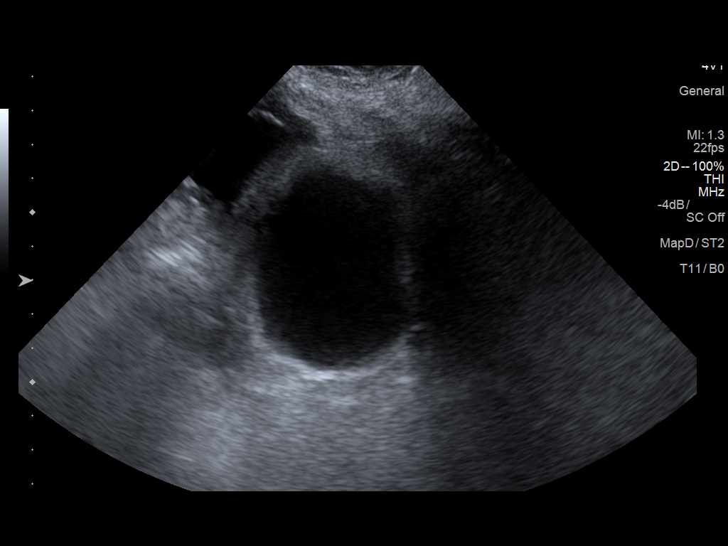

[14 of 25 positions shown; findings below may reference images not displayed]

FINDINGS: Right Kidney:

Length: 11.6 cm. Echogenicity within normal limits. Mild prominence
of the central intrarenal collecting system and renal pelvis. No
focal mass.

Left Kidney:

Length: 8.4 cm. Echogenicity within normal limits. No mass or
hydronephrosis visualized.

Bladder:

Appears normal for degree of bladder distention. Ureteral jets not
visualized.

Ascites.
IMPRESSION: Mild size discrepancy with slightly small left kidney. Mild
dilatation of the central right intrarenal collecting system/renal
pelvis.
